# Patient Record
Sex: Male | Born: 1935 | Race: White | Hispanic: No | Marital: Married | Smoking: Never smoker
Health system: Southern US, Community
[De-identification: ages and names within clinical notes are randomized; demographics above are authoritative.]

## PROBLEM LIST (undated history)

## (undated) DIAGNOSIS — I6529 Occlusion and stenosis of unspecified carotid artery: Secondary | ICD-10-CM

## (undated) DIAGNOSIS — F329 Major depressive disorder, single episode, unspecified: Secondary | ICD-10-CM

## (undated) DIAGNOSIS — H269 Unspecified cataract: Secondary | ICD-10-CM

## (undated) DIAGNOSIS — R42 Dizziness and giddiness: Secondary | ICD-10-CM

## (undated) DIAGNOSIS — I6381 Other cerebral infarction due to occlusion or stenosis of small artery: Secondary | ICD-10-CM

## (undated) DIAGNOSIS — E7800 Pure hypercholesterolemia, unspecified: Secondary | ICD-10-CM

## (undated) DIAGNOSIS — F419 Anxiety disorder, unspecified: Secondary | ICD-10-CM

## (undated) DIAGNOSIS — C61 Malignant neoplasm of prostate: Secondary | ICD-10-CM

## (undated) DIAGNOSIS — H409 Unspecified glaucoma: Secondary | ICD-10-CM

## (undated) DIAGNOSIS — M25561 Pain in right knee: Secondary | ICD-10-CM

## (undated) DIAGNOSIS — F1320 Sedative, hypnotic or anxiolytic dependence, uncomplicated: Secondary | ICD-10-CM

## (undated) DIAGNOSIS — I10 Essential (primary) hypertension: Secondary | ICD-10-CM

## (undated) DIAGNOSIS — M25562 Pain in left knee: Secondary | ICD-10-CM

## (undated) DIAGNOSIS — B0230 Zoster ocular disease, unspecified: Secondary | ICD-10-CM

## (undated) DIAGNOSIS — Z5189 Encounter for other specified aftercare: Secondary | ICD-10-CM

## (undated) DIAGNOSIS — R269 Unspecified abnormalities of gait and mobility: Secondary | ICD-10-CM

## (undated) DIAGNOSIS — H9190 Unspecified hearing loss, unspecified ear: Secondary | ICD-10-CM

## (undated) DIAGNOSIS — M5416 Radiculopathy, lumbar region: Secondary | ICD-10-CM

## (undated) DIAGNOSIS — M1990 Unspecified osteoarthritis, unspecified site: Secondary | ICD-10-CM

## (undated) DIAGNOSIS — H538 Other visual disturbances: Secondary | ICD-10-CM

## (undated) DIAGNOSIS — R2689 Other abnormalities of gait and mobility: Secondary | ICD-10-CM

## (undated) DIAGNOSIS — M461 Sacroiliitis, not elsewhere classified: Secondary | ICD-10-CM

## (undated) HISTORY — DX: Pain in right knee: M25.561

## (undated) HISTORY — DX: Unspecified osteoarthritis, unspecified site: M19.90

## (undated) HISTORY — DX: Zoster ocular disease, unspecified: B02.30

## (undated) HISTORY — DX: Other abnormalities of gait and mobility: R26.89

## (undated) HISTORY — DX: Radiculopathy, lumbar region: M54.16

## (undated) HISTORY — DX: Unspecified glaucoma: H40.9

## (undated) HISTORY — DX: Major depressive disorder, single episode, unspecified: F32.9

## (undated) HISTORY — PX: HERNIA REPAIR: SHX51

## (undated) HISTORY — DX: Pain in right knee: M25.562

## (undated) HISTORY — DX: Unspecified hearing loss, unspecified ear: H91.90

## (undated) HISTORY — DX: Anxiety disorder, unspecified: F41.9

## (undated) HISTORY — DX: Encounter for other specified aftercare: Z51.89

## (undated) HISTORY — PX: PROSTATECTOMY: SHX69

## (undated) HISTORY — DX: Dizziness and giddiness: R42

## (undated) HISTORY — DX: Unspecified abnormalities of gait and mobility: R26.9

## (undated) HISTORY — DX: Other visual disturbances: H53.8

## (undated) HISTORY — DX: Unspecified cataract: H26.9

## (undated) HISTORY — DX: Occlusion and stenosis of unspecified carotid artery: I65.29

## (undated) HISTORY — PX: COLONOSCOPY: SHX174

## (undated) HISTORY — DX: Sacroiliitis, not elsewhere classified: M46.1

## (undated) HISTORY — DX: Malignant neoplasm of prostate: C61

## (undated) HISTORY — DX: Other cerebral infarction due to occlusion or stenosis of small artery: I63.81

## (undated) HISTORY — DX: Sedative, hypnotic or anxiolytic dependence, uncomplicated: F13.20

---

## 2000-06-27 ENCOUNTER — Emergency Department (HOSPITAL_COMMUNITY): Admission: EM | Admit: 2000-06-27 | Discharge: 2000-06-27 | Payer: Self-pay | Admitting: Emergency Medicine

## 2000-06-28 ENCOUNTER — Other Ambulatory Visit: Admission: RE | Admit: 2000-06-28 | Discharge: 2000-06-28 | Payer: Self-pay | Admitting: Urology

## 2000-07-04 ENCOUNTER — Emergency Department (HOSPITAL_COMMUNITY): Admission: EM | Admit: 2000-07-04 | Discharge: 2000-07-04 | Payer: Self-pay | Admitting: Internal Medicine

## 2000-07-09 ENCOUNTER — Encounter: Admission: RE | Admit: 2000-07-09 | Discharge: 2000-10-07 | Payer: Self-pay | Admitting: Radiation Oncology

## 2000-10-08 ENCOUNTER — Ambulatory Visit (HOSPITAL_COMMUNITY): Admission: RE | Admit: 2000-10-08 | Discharge: 2000-10-08 | Payer: Self-pay | Admitting: Urology

## 2000-10-08 ENCOUNTER — Encounter: Payer: Self-pay | Admitting: Urology

## 2000-10-22 ENCOUNTER — Ambulatory Visit (HOSPITAL_COMMUNITY): Admission: RE | Admit: 2000-10-22 | Discharge: 2000-10-22 | Payer: Self-pay | Admitting: Urology

## 2000-11-20 ENCOUNTER — Inpatient Hospital Stay (HOSPITAL_COMMUNITY): Admission: RE | Admit: 2000-11-20 | Discharge: 2000-11-23 | Payer: Self-pay | Admitting: Urology

## 2001-04-01 ENCOUNTER — Ambulatory Visit (HOSPITAL_COMMUNITY): Admission: RE | Admit: 2001-04-01 | Discharge: 2001-04-01 | Payer: Self-pay | Admitting: Internal Medicine

## 2001-04-01 ENCOUNTER — Encounter: Payer: Self-pay | Admitting: Internal Medicine

## 2001-11-22 ENCOUNTER — Observation Stay (HOSPITAL_COMMUNITY): Admission: RE | Admit: 2001-11-22 | Discharge: 2001-11-23 | Payer: Self-pay | Admitting: Surgery

## 2002-03-06 ENCOUNTER — Ambulatory Visit (HOSPITAL_COMMUNITY): Admission: RE | Admit: 2002-03-06 | Discharge: 2002-03-06 | Payer: Self-pay | Admitting: Gastroenterology

## 2003-04-06 ENCOUNTER — Encounter: Admission: RE | Admit: 2003-04-06 | Discharge: 2003-04-06 | Payer: Self-pay | Admitting: Obstetrics and Gynecology

## 2003-06-25 ENCOUNTER — Observation Stay (HOSPITAL_COMMUNITY): Admission: RE | Admit: 2003-06-25 | Discharge: 2003-06-26 | Payer: Self-pay | Admitting: Surgery

## 2003-10-19 ENCOUNTER — Encounter: Admission: RE | Admit: 2003-10-19 | Discharge: 2003-10-19 | Payer: Self-pay | Admitting: Internal Medicine

## 2003-10-27 ENCOUNTER — Encounter: Admission: RE | Admit: 2003-10-27 | Discharge: 2003-11-30 | Payer: Self-pay | Admitting: Internal Medicine

## 2005-07-24 ENCOUNTER — Ambulatory Visit (HOSPITAL_COMMUNITY): Admission: RE | Admit: 2005-07-24 | Discharge: 2005-07-25 | Payer: Self-pay | Admitting: Surgery

## 2008-06-23 ENCOUNTER — Ambulatory Visit: Payer: Self-pay | Admitting: Internal Medicine

## 2008-07-14 ENCOUNTER — Encounter: Payer: Self-pay | Admitting: Internal Medicine

## 2008-07-14 ENCOUNTER — Ambulatory Visit: Payer: Self-pay | Admitting: Internal Medicine

## 2008-07-14 LAB — HM COLONOSCOPY

## 2008-07-17 ENCOUNTER — Encounter: Payer: Self-pay | Admitting: Internal Medicine

## 2009-05-23 ENCOUNTER — Emergency Department (HOSPITAL_BASED_OUTPATIENT_CLINIC_OR_DEPARTMENT_OTHER): Admission: EM | Admit: 2009-05-23 | Discharge: 2009-05-23 | Payer: Self-pay | Admitting: Emergency Medicine

## 2010-08-05 NOTE — Op Note (Signed)
NAMELEORY, ALLINSON                      ACCOUNT NO.:  [id number]   MEDICAL RECORD NO.:  [id number]                   PATIENT TYPE:  OBV   LOCATION:  0365                                 FACILITY:  [facility]   PHYSICIAN:  Sandria Bales. Ezzard Standing, M.D.               DATE OF BIRTH:  February 17, 1930   DATE OF PROCEDURE:  06/25/2003  DATE OF DISCHARGE:                                 OPERATIVE REPORT   PREOPERATIVE DIAGNOSES:  Recurrent lower abdominal wall incisional hernia.   POSTOPERATIVE DIAGNOSES:  Recurrent left lower quadrant abdominal wall  incisional hernia.   PROCEDURE:  Open repair of left lower quadrant incisional hernia with mesh.   SURGEON:  Sandria Bales. Ezzard Standing, M.D.   ANESTHESIA:  General endotracheal.   ESTIMATED BLOOD LOSS:  Minimal.   INDICATIONS FOR PROCEDURE:  Mr. Mans is a 77 year old white male who had  a prior open prostatectomy by Jamison Neighbor, M.D. and then developed a  left inguinal hernia.  His open prostatectomy was September of 2002.  I did  an open repair of the lower abdominal hernia and inguinal hernia in  September of 2003.  He has now noticed a bulge in his left groin so it has  been approximately 18 months since his last surgery.  A CT scan confirmed  evidence for recurrent hernia.  It is hard to tell on physical examination  whether this is a direct inguinal hernia or this is a recurrent lower  abdominal wall hernia. The discussion of the indication and potential  complications explained to the patient. The potential complications include  but not limited to bleeding, infection, recurrence of the hernia, and  bladder injury.   The patient taken to the operating room where he underwent general  anesthesia.  I did put a Foley catheter in him because it looks like on CAT  scan the bladder comes up to the posterior wall of this hernia defect.  He  got 1 g of Ancef at the initiation of the procedure, his lower abdomen was  shaved, prepped with Betadine  solution and sterilely draped.   I made a left inguinal incision and cut down to the abdominal wall. This  hernia really is an incisional lower abdominal wall hernia and not an  inguinal hernia. The inferior border is the pubic bone, the right border of  the hernia appears to be the rectus muscle and fascia on the right side. The  superior and left lateral borders appear to be some mesh and a continuing  defect.   I mobilized the hernia sac and identified about a 3 x 4 cm hernia defect as  already outlined.   I then sewed in atrial mesh using interrupted #0 novofil sutures. The suture  inferiorly was tacked to the pubic bone to the right side and was tacked to  the rectus abdominis muscle and fascia. Superiorly and laterally the tissues  were thinned out and  this probably was the reason for this recurrent hernia  though I had put some mesh in there before and I tried to capture the  previous mesh to help hold this in place.   His cord was went lateral to the defect so I do not think this was again  involved in an inguinal hernia but more a lower abdominal wall hernia.  The  mesh I lay after I sewed it in place measured about 7 x 8 cm. There was  about a 2 cm overlap on all sides of the hernia defect.  I then irrigated  the wound with about 800 mL of saline, closed the subcutaneous tissues with  interrupted 3-0 Vicryl sutures, closed the skin with a running 5-0 Vicryl  suture, infiltrated about 20 mL of 0.25% Marcaine as a local anesthetic. I  painted the wound with tincture of  Benzoin and steri-stripped it.   The patient tolerated the procedure well and was transported to the recovery  room in good condition.                                               Sandria Bales. Ezzard Standing, M.D.    DHN/MEDQ  D:  06/25/2003  T:  06/25/2003  Job:  [id number]   cc:   Geoffry Paradise, M.D.  [address]  [place]  [place] [address]  Fax: [phone]   Jamison Neighbor, M.D.  [address]   [place]  [place] [address]  Fax: [phone]

## 2010-08-05 NOTE — Op Note (Signed)
[name] Duffy, [name] Duffy            ACCOUNT NO.:  [id number]   MEDICAL RECORD NO.:  [id number]          PATIENT TYPE:  AMB   LOCATION:  DAY                          [facility]ACILITY:  [facility]   PHYSICIAN:  Sandria Bales. Ezzard Standing, M.D.  DATE OF BIRTH:  February 19, 1930   DATE OF PROCEDURE:  07/24/2005  DATE OF DISCHARGE:                                 OPERATIVE REPORT   PREOPERATIVE DIAGNOSIS:  Recurrent lower abdominal hernia off the left groin  area.   POSTOPERATIVE DIAGNOSIS:  Recurrent lower abdominal wall hernia over the  left suprapubic groin area.   PROCEDURE:  Open repair of left lower abdominal wall hernia with 6 x 6-cm  Ventralex mesh repair.   SURGEON:  Sandria Bales. Ezzard Standing, M.D.   FIRST ASSISTANT:  None.   ANESTHESIA:  General LMA with approximately 20 cc of 0.25% Marcaine.   COMPLICATIONS:  None.   INDICATIONS FOR PROCEDURE:  Mr. Driggers is a 77 year old male patient of Dr.  Othelia Pulling who initially had a prostatectomy, I think by Dr. Eudelia Bunch.  He developed a hernia in his lower abdominal wall which I originally  repaired in September of 2003, and then in April of 2005, he had a second  repair of a lower abdominal wall/left inguinal hernia.  This time he appears  to have another lower abdominal wall hernia which protrudes in the lower  abdomen off the left side of midline.  It was still a primary incisional  hernia.  Over the last month or two though, he has now noticed a bulge again  in his left groin.  A CT scan is suggestive of a left lower abdominal  wall/inguinal hernia.  He now comes for repair again of this defect.   The indications and potential complications were explained to the patient,  the potential complications including but not limited to bleeding,  infection, recurrence of the hernia, nerve injury, and bladder injury.   DESCRIPTION OF PROCEDURE:  The patient was placed in a supine position and  given a general LMA anesthesia.  He was given 1 g of Ancef at the  initiation  of the procedure.  A Foley catheter was placed.  His abdomen was shaved,  prepped with Betadine solution, and sterilely draped.   An incision was made directly in the left groin area.  I marked him  preoperatively standing, kind of showing where this bulge was.  This bulge  was coming from a lower abdominal wall hernia or a direct inguinal hernia.  Because of his multiple operations, really he had no clear planes of fascia  that were normal or obvious.  It appears that what he has is sort of a  defect where the abdominal wall was giving way between the mesh medially  from his incisional hernia and the mesh laterally from his groin hernia.   I actually opened to the mesh in the lower abdominal wall and found about a  maybe 3 to 4-cm defect.   I was worried by the way the CT scan looked that he may have bladder up in  it, but actually he had preperitoneal fat  that I was able to push away from  the anterior abdominal wall, so I decided to place a preperitoneal Ventralex  mesh in this space.  This mesh measured  6 x 6 cm, so this is a medium  Ventralex mesh.  I sewed this in place with 0 Novofil sutures, and this  covered the defect well.  With my finger in his preperitoneal space, I felt  no other obvious hernia of his lower abdominal wall, at least in that  limited area.  I then closed the fascia and closed the mesh over this with  interrupted #1 Novofil sutures.  I then irrigated the wound and closed the  subcutaneous tissues with 3-0 Vicryl sutures and the skin with a 4-0 Vicryl  suture.  I painted the wound with tincture of benzoin and Steri-Strips.   The patient tolerated the procedure well and was transported to the recovery  room in good condition.  Sponge and needle counts were correct at the end of  the case.      Sandria Bales. Ezzard Standing, M.D.  Electronically Signed     DHN/MEDQ  D:  07/24/2005  T:  07/25/2005  Job:  [id number]   cc:   Geoffry Paradise, M.D.  Fax:  [phone]   Jamison Neighbor, M.D.  Fax: [phone]

## 2011-01-28 DIAGNOSIS — Z8546 Personal history of malignant neoplasm of prostate: Secondary | ICD-10-CM | POA: Insufficient documentation

## 2011-07-05 DIAGNOSIS — M1990 Unspecified osteoarthritis, unspecified site: Secondary | ICD-10-CM | POA: Diagnosis not present

## 2011-07-05 DIAGNOSIS — C61 Malignant neoplasm of prostate: Secondary | ICD-10-CM | POA: Diagnosis not present

## 2011-07-05 DIAGNOSIS — I10 Essential (primary) hypertension: Secondary | ICD-10-CM | POA: Diagnosis not present

## 2011-11-13 ENCOUNTER — Other Ambulatory Visit: Payer: Self-pay | Admitting: Dermatology

## 2011-11-13 DIAGNOSIS — B359 Dermatophytosis, unspecified: Secondary | ICD-10-CM | POA: Diagnosis not present

## 2011-11-13 DIAGNOSIS — D485 Neoplasm of uncertain behavior of skin: Secondary | ICD-10-CM | POA: Diagnosis not present

## 2011-11-13 DIAGNOSIS — L719 Rosacea, unspecified: Secondary | ICD-10-CM | POA: Diagnosis not present

## 2011-11-13 DIAGNOSIS — L821 Other seborrheic keratosis: Secondary | ICD-10-CM | POA: Diagnosis not present

## 2011-11-13 DIAGNOSIS — D2370 Other benign neoplasm of skin of unspecified lower limb, including hip: Secondary | ICD-10-CM | POA: Diagnosis not present

## 2011-11-13 DIAGNOSIS — L578 Other skin changes due to chronic exposure to nonionizing radiation: Secondary | ICD-10-CM | POA: Diagnosis not present

## 2011-11-13 DIAGNOSIS — C4441 Basal cell carcinoma of skin of scalp and neck: Secondary | ICD-10-CM | POA: Diagnosis not present

## 2011-11-24 DIAGNOSIS — R82998 Other abnormal findings in urine: Secondary | ICD-10-CM | POA: Diagnosis not present

## 2011-11-24 DIAGNOSIS — N3000 Acute cystitis without hematuria: Secondary | ICD-10-CM | POA: Diagnosis not present

## 2011-11-24 DIAGNOSIS — C61 Malignant neoplasm of prostate: Secondary | ICD-10-CM | POA: Diagnosis not present

## 2011-11-24 DIAGNOSIS — R310 Gross hematuria: Secondary | ICD-10-CM | POA: Diagnosis not present

## 2011-11-24 DIAGNOSIS — I10 Essential (primary) hypertension: Secondary | ICD-10-CM | POA: Diagnosis not present

## 2011-12-08 DIAGNOSIS — R310 Gross hematuria: Secondary | ICD-10-CM | POA: Diagnosis not present

## 2011-12-13 DIAGNOSIS — N393 Stress incontinence (female) (male): Secondary | ICD-10-CM | POA: Insufficient documentation

## 2011-12-13 DIAGNOSIS — C61 Malignant neoplasm of prostate: Secondary | ICD-10-CM | POA: Diagnosis not present

## 2011-12-13 DIAGNOSIS — Z8546 Personal history of malignant neoplasm of prostate: Secondary | ICD-10-CM | POA: Diagnosis not present

## 2012-01-02 DIAGNOSIS — I10 Essential (primary) hypertension: Secondary | ICD-10-CM | POA: Diagnosis not present

## 2012-01-02 DIAGNOSIS — E785 Hyperlipidemia, unspecified: Secondary | ICD-10-CM | POA: Diagnosis not present

## 2012-01-02 DIAGNOSIS — Z125 Encounter for screening for malignant neoplasm of prostate: Secondary | ICD-10-CM | POA: Diagnosis not present

## 2012-01-05 DIAGNOSIS — L905 Scar conditions and fibrosis of skin: Secondary | ICD-10-CM | POA: Diagnosis not present

## 2012-01-05 DIAGNOSIS — Z85828 Personal history of other malignant neoplasm of skin: Secondary | ICD-10-CM | POA: Diagnosis not present

## 2012-01-05 DIAGNOSIS — L821 Other seborrheic keratosis: Secondary | ICD-10-CM | POA: Diagnosis not present

## 2012-01-09 DIAGNOSIS — Z0000 Encounter for general adult medical examination without abnormal findings: Secondary | ICD-10-CM | POA: Diagnosis not present

## 2012-01-09 DIAGNOSIS — E785 Hyperlipidemia, unspecified: Secondary | ICD-10-CM | POA: Diagnosis not present

## 2012-01-09 DIAGNOSIS — Z1331 Encounter for screening for depression: Secondary | ICD-10-CM | POA: Diagnosis not present

## 2012-01-09 DIAGNOSIS — I10 Essential (primary) hypertension: Secondary | ICD-10-CM | POA: Diagnosis not present

## 2012-01-09 DIAGNOSIS — F329 Major depressive disorder, single episode, unspecified: Secondary | ICD-10-CM | POA: Diagnosis not present

## 2012-01-15 DIAGNOSIS — Z1212 Encounter for screening for malignant neoplasm of rectum: Secondary | ICD-10-CM | POA: Diagnosis not present

## 2012-03-27 DIAGNOSIS — N393 Stress incontinence (female) (male): Secondary | ICD-10-CM | POA: Diagnosis not present

## 2012-03-27 DIAGNOSIS — Z8546 Personal history of malignant neoplasm of prostate: Secondary | ICD-10-CM | POA: Diagnosis not present

## 2012-03-27 DIAGNOSIS — C61 Malignant neoplasm of prostate: Secondary | ICD-10-CM | POA: Diagnosis not present

## 2012-07-08 DIAGNOSIS — L259 Unspecified contact dermatitis, unspecified cause: Secondary | ICD-10-CM | POA: Diagnosis not present

## 2012-07-08 DIAGNOSIS — D1801 Hemangioma of skin and subcutaneous tissue: Secondary | ICD-10-CM | POA: Diagnosis not present

## 2012-07-08 DIAGNOSIS — L821 Other seborrheic keratosis: Secondary | ICD-10-CM | POA: Diagnosis not present

## 2012-07-08 DIAGNOSIS — B353 Tinea pedis: Secondary | ICD-10-CM | POA: Diagnosis not present

## 2012-07-08 DIAGNOSIS — Z85828 Personal history of other malignant neoplasm of skin: Secondary | ICD-10-CM | POA: Diagnosis not present

## 2012-07-19 DIAGNOSIS — M1990 Unspecified osteoarthritis, unspecified site: Secondary | ICD-10-CM | POA: Diagnosis not present

## 2012-07-19 DIAGNOSIS — F329 Major depressive disorder, single episode, unspecified: Secondary | ICD-10-CM | POA: Diagnosis not present

## 2012-07-19 DIAGNOSIS — IMO0002 Reserved for concepts with insufficient information to code with codable children: Secondary | ICD-10-CM | POA: Diagnosis not present

## 2012-07-19 DIAGNOSIS — I10 Essential (primary) hypertension: Secondary | ICD-10-CM | POA: Diagnosis not present

## 2012-07-19 DIAGNOSIS — E785 Hyperlipidemia, unspecified: Secondary | ICD-10-CM | POA: Diagnosis not present

## 2012-08-14 DIAGNOSIS — H905 Unspecified sensorineural hearing loss: Secondary | ICD-10-CM | POA: Diagnosis not present

## 2012-08-30 DIAGNOSIS — L259 Unspecified contact dermatitis, unspecified cause: Secondary | ICD-10-CM | POA: Diagnosis not present

## 2012-08-30 DIAGNOSIS — I8390 Asymptomatic varicose veins of unspecified lower extremity: Secondary | ICD-10-CM | POA: Diagnosis not present

## 2012-11-05 DIAGNOSIS — L0100 Impetigo, unspecified: Secondary | ICD-10-CM | POA: Diagnosis not present

## 2012-11-05 DIAGNOSIS — Z85828 Personal history of other malignant neoplasm of skin: Secondary | ICD-10-CM | POA: Diagnosis not present

## 2012-12-13 DIAGNOSIS — IMO0002 Reserved for concepts with insufficient information to code with codable children: Secondary | ICD-10-CM | POA: Diagnosis not present

## 2012-12-13 DIAGNOSIS — M7040 Prepatellar bursitis, unspecified knee: Secondary | ICD-10-CM | POA: Diagnosis not present

## 2012-12-17 DIAGNOSIS — I10 Essential (primary) hypertension: Secondary | ICD-10-CM | POA: Diagnosis not present

## 2012-12-17 DIAGNOSIS — M7040 Prepatellar bursitis, unspecified knee: Secondary | ICD-10-CM | POA: Diagnosis not present

## 2013-01-06 ENCOUNTER — Encounter (HOSPITAL_COMMUNITY): Payer: Self-pay | Admitting: Emergency Medicine

## 2013-01-06 ENCOUNTER — Emergency Department (HOSPITAL_COMMUNITY)
Admission: EM | Admit: 2013-01-06 | Discharge: 2013-01-06 | Disposition: A | Discharge status: Home / Self Care | Payer: Medicare Other | Attending: Emergency Medicine | Admitting: Emergency Medicine

## 2013-01-06 DIAGNOSIS — R319 Hematuria, unspecified: Secondary | ICD-10-CM

## 2013-01-06 DIAGNOSIS — Z79899 Other long term (current) drug therapy: Secondary | ICD-10-CM | POA: Diagnosis not present

## 2013-01-06 DIAGNOSIS — Z8546 Personal history of malignant neoplasm of prostate: Secondary | ICD-10-CM | POA: Insufficient documentation

## 2013-01-06 DIAGNOSIS — Z792 Long term (current) use of antibiotics: Secondary | ICD-10-CM | POA: Insufficient documentation

## 2013-01-06 DIAGNOSIS — R300 Dysuria: Secondary | ICD-10-CM | POA: Insufficient documentation

## 2013-01-06 DIAGNOSIS — I10 Essential (primary) hypertension: Secondary | ICD-10-CM | POA: Diagnosis not present

## 2013-01-06 DIAGNOSIS — E7800 Pure hypercholesterolemia, unspecified: Secondary | ICD-10-CM | POA: Diagnosis not present

## 2013-01-06 HISTORY — DX: Pure hypercholesterolemia, unspecified: E78.00

## 2013-01-06 HISTORY — DX: Malignant neoplasm of prostate: C61

## 2013-01-06 HISTORY — DX: Essential (primary) hypertension: I10

## 2013-01-06 LAB — URINALYSIS, ROUTINE W REFLEX MICROSCOPIC
Bilirubin Urine: NEGATIVE
Glucose, UA: NEGATIVE mg/dL
Ketones, ur: NEGATIVE mg/dL
pH: 6 (ref 5.0–8.0)

## 2013-01-06 MED ORDER — CIPROFLOXACIN HCL 500 MG PO TABS
500.0000 mg | ORAL_TABLET | Freq: Once | ORAL | Status: AC
  Administered 2013-01-06: 500 mg via ORAL
  Filled 2013-01-06: qty 1

## 2013-01-06 MED ORDER — CIPROFLOXACIN HCL 500 MG PO TABS: 500.0000 mg | ORAL_TABLET | Freq: Two times a day (BID) | ORAL | Status: DC

## 2013-01-06 NOTE — ED Notes (Signed)
Bladder scan done post voiding and only showed 49ml in the bladder

## 2013-01-06 NOTE — ED Notes (Signed)
Pt states this morning around 2am he got up to urinate and his urine had a lot of blood in it  Pt states the next few time he went he had blood clots in it  Pt states once had heavy clots and the other two times just a small amt of clots  Pt states he was ok yesterday  Pt denies pain at this time

## 2013-01-06 NOTE — ED Provider Notes (Signed)
CSN: [id number]     Arrival date & time 01/06/13  0636 History   First MD Initiated Contact with Patient 01/06/13 [phone]     Chief Complaint  Patient presents with  . Hematuria   (Consider location/radiation/quality/duration/timing/severity/associated sxs/prior Treatment) HPI Comments: The pt is a 79 y/o male with hx of Prostate CA - treated in 2002 with radical prostatectomy - he presents with c/o hematuria which started this AM - 2 AM - he has had 3 discrete episodes of hematuria - states that he is passing clots when he urinates - this is associated with some burning dysuria - no abd pain, f/c/n/v.  He has had this a couple of times in the past related to infections.  He is not on anticoagulants.  Patient is a 79 y.o. male presenting with hematuria. The history is provided by the patient.  Hematuria Pertinent negatives include no abdominal pain.    Past Medical History  Diagnosis Date  . Prostate cancer   . Hypertension   . High cholesterol    Past Surgical History  Procedure Laterality Date  . Hernia repair    . Prostatectomy     Family History  Problem Relation Age of Onset  . Hypertension Other    History  Substance Use Topics  . Smoking status: Never Smoker   . Smokeless tobacco: Not on file  . Alcohol Use: No    Review of Systems  Constitutional: Negative for fever and chills.  Gastrointestinal: Negative for nausea, vomiting and abdominal pain.  Genitourinary: Positive for hematuria.    Allergies  Rofecoxib  Home Medications   Current Outpatient Rx  Name  Route  Sig  Dispense  Refill  . citalopram (CELEXA) 20 MG tablet   Oral   Take 1 tablet by mouth every evening.          Marland Kitchen losartan (COZAAR) 100 MG tablet   Oral   Take 1 tablet by mouth every morning.         . simvastatin (ZOCOR) 20 MG tablet   Oral   Take 1 tablet by mouth every evening.         . ciprofloxacin (CIPRO) 500 MG tablet   Oral   Take 1 tablet (500 mg total) by mouth every  12 (twelve) hours.   20 tablet   0    BP 152/84  Pulse 87  Temp(Src) 97.9 F (36.6 C) (Oral)  Resp 18  SpO2 95% Physical Exam  Constitutional: He appears well-developed and well-nourished. No distress.  HENT:  Head: Normocephalic and atraumatic.  Eyes: Conjunctivae are normal. Right eye exhibits no discharge. Left eye exhibits no discharge. No scleral icterus.  Cardiovascular: Normal rate and regular rhythm.   Occasional ectopy - no JVD, strong pulses at the radial arteries  Pulmonary/Chest: Effort normal. No respiratory distress. He has no wheezes. He has no rales.  Abdominal: Soft. He exhibits no distension. There is no tenderness. There is no rebound and no guarding.  No masses, no SP fullness  Genitourinary:  Normal appearing circumcised penis - small am't of blood at the urethral meatus  Musculoskeletal: Normal range of motion. He exhibits no edema and no tenderness.  Neurological: He is alert. Coordination normal.    ED Course  Procedures (including critical care time) Labs Review Labs Reviewed  URINALYSIS, ROUTINE W REFLEX MICROSCOPIC - Abnormal; Notable for the following:    Color, Urine RED (*)    APPearance CLOUDY (*)    Hgb urine dipstick LARGE (*)  Protein, ur >300 (*)    Leukocytes, UA SMALL (*)    All other components within normal limits  URINE CULTURE  URINE MICROSCOPIC-ADD ON   Imaging Review No results found.  EKG Interpretation   None       MDM   1. Hematuria    Hematuria - new onset, recurrent but over many years - Mr. Hardgrove is otherwise well appearing with normal VS, normal abd exam and no constitutional sx.  CheckUA - would consider UTI to be top of list - vascular source possible but less likely, recurrent CA possible.  UA pending.   Pt has hematuria on labs - VS's remain stable - he will be given Abx and f/u with Urology.  Meds given in ED:  Medications - No data to display  New Prescriptions   CIPROFLOXACIN (CIPRO) 500 MG  TABLET    Take 1 tablet (500 mg total) by mouth every 12 (twelve) hours.      Vida Roller, MD 01/06/13 [phone]

## 2013-01-07 LAB — URINE CULTURE: Colony Count: 50000

## 2013-01-08 DIAGNOSIS — D1801 Hemangioma of skin and subcutaneous tissue: Secondary | ICD-10-CM | POA: Diagnosis not present

## 2013-01-08 DIAGNOSIS — Z8546 Personal history of malignant neoplasm of prostate: Secondary | ICD-10-CM | POA: Diagnosis not present

## 2013-01-08 DIAGNOSIS — N393 Stress incontinence (female) (male): Secondary | ICD-10-CM | POA: Diagnosis not present

## 2013-01-08 DIAGNOSIS — L259 Unspecified contact dermatitis, unspecified cause: Secondary | ICD-10-CM | POA: Diagnosis not present

## 2013-01-08 DIAGNOSIS — D1800 Hemangioma unspecified site: Secondary | ICD-10-CM | POA: Diagnosis not present

## 2013-01-08 DIAGNOSIS — R319 Hematuria, unspecified: Secondary | ICD-10-CM | POA: Insufficient documentation

## 2013-01-08 DIAGNOSIS — Z85828 Personal history of other malignant neoplasm of skin: Secondary | ICD-10-CM | POA: Diagnosis not present

## 2013-01-08 DIAGNOSIS — L821 Other seborrheic keratosis: Secondary | ICD-10-CM | POA: Diagnosis not present

## 2013-01-22 DIAGNOSIS — E785 Hyperlipidemia, unspecified: Secondary | ICD-10-CM | POA: Diagnosis not present

## 2013-01-22 DIAGNOSIS — I10 Essential (primary) hypertension: Secondary | ICD-10-CM | POA: Diagnosis not present

## 2013-01-22 DIAGNOSIS — Z23 Encounter for immunization: Secondary | ICD-10-CM | POA: Diagnosis not present

## 2013-01-22 DIAGNOSIS — Z125 Encounter for screening for malignant neoplasm of prostate: Secondary | ICD-10-CM | POA: Diagnosis not present

## 2013-01-23 DIAGNOSIS — Z8546 Personal history of malignant neoplasm of prostate: Secondary | ICD-10-CM | POA: Diagnosis not present

## 2013-01-23 DIAGNOSIS — R319 Hematuria, unspecified: Secondary | ICD-10-CM | POA: Diagnosis not present

## 2013-01-23 DIAGNOSIS — N281 Cyst of kidney, acquired: Secondary | ICD-10-CM | POA: Diagnosis not present

## 2013-01-29 DIAGNOSIS — C61 Malignant neoplasm of prostate: Secondary | ICD-10-CM | POA: Diagnosis not present

## 2013-01-29 DIAGNOSIS — Z0000 Encounter for general adult medical examination without abnormal findings: Secondary | ICD-10-CM | POA: Diagnosis not present

## 2013-01-29 DIAGNOSIS — M1990 Unspecified osteoarthritis, unspecified site: Secondary | ICD-10-CM | POA: Diagnosis not present

## 2013-01-29 DIAGNOSIS — R310 Gross hematuria: Secondary | ICD-10-CM | POA: Diagnosis not present

## 2013-01-29 DIAGNOSIS — Z1331 Encounter for screening for depression: Secondary | ICD-10-CM | POA: Diagnosis not present

## 2013-01-29 DIAGNOSIS — F329 Major depressive disorder, single episode, unspecified: Secondary | ICD-10-CM | POA: Diagnosis not present

## 2013-01-29 DIAGNOSIS — I10 Essential (primary) hypertension: Secondary | ICD-10-CM | POA: Diagnosis not present

## 2013-01-29 DIAGNOSIS — E785 Hyperlipidemia, unspecified: Secondary | ICD-10-CM | POA: Diagnosis not present

## 2013-03-07 DIAGNOSIS — Z1212 Encounter for screening for malignant neoplasm of rectum: Secondary | ICD-10-CM | POA: Diagnosis not present

## 2013-05-19 ENCOUNTER — Encounter: Payer: Self-pay | Admitting: Internal Medicine

## 2013-07-23 DIAGNOSIS — F411 Generalized anxiety disorder: Secondary | ICD-10-CM | POA: Insufficient documentation

## 2014-02-11 ENCOUNTER — Other Ambulatory Visit: Payer: Self-pay | Admitting: Dermatology

## 2014-02-11 DIAGNOSIS — C4441 Basal cell carcinoma of skin of scalp and neck: Secondary | ICD-10-CM | POA: Diagnosis not present

## 2014-02-11 DIAGNOSIS — D225 Melanocytic nevi of trunk: Secondary | ICD-10-CM | POA: Diagnosis not present

## 2014-02-11 DIAGNOSIS — D1801 Hemangioma of skin and subcutaneous tissue: Secondary | ICD-10-CM | POA: Diagnosis not present

## 2014-02-11 DIAGNOSIS — L308 Other specified dermatitis: Secondary | ICD-10-CM | POA: Diagnosis not present

## 2014-02-11 DIAGNOSIS — I8391 Asymptomatic varicose veins of right lower extremity: Secondary | ICD-10-CM | POA: Diagnosis not present

## 2014-02-11 DIAGNOSIS — L570 Actinic keratosis: Secondary | ICD-10-CM | POA: Diagnosis not present

## 2014-02-11 DIAGNOSIS — L821 Other seborrheic keratosis: Secondary | ICD-10-CM | POA: Diagnosis not present

## 2014-02-11 DIAGNOSIS — I8392 Asymptomatic varicose veins of left lower extremity: Secondary | ICD-10-CM | POA: Diagnosis not present

## 2014-02-11 DIAGNOSIS — Z85828 Personal history of other malignant neoplasm of skin: Secondary | ICD-10-CM | POA: Diagnosis not present

## 2014-04-01 DIAGNOSIS — B372 Candidiasis of skin and nail: Secondary | ICD-10-CM | POA: Diagnosis not present

## 2014-04-01 DIAGNOSIS — Z6823 Body mass index (BMI) 23.0-23.9, adult: Secondary | ICD-10-CM | POA: Diagnosis not present

## 2014-04-27 DIAGNOSIS — L84 Corns and callosities: Secondary | ICD-10-CM | POA: Diagnosis not present

## 2014-04-27 DIAGNOSIS — L309 Dermatitis, unspecified: Secondary | ICD-10-CM | POA: Diagnosis not present

## 2014-04-27 DIAGNOSIS — L602 Onychogryphosis: Secondary | ICD-10-CM | POA: Diagnosis not present

## 2014-04-30 DIAGNOSIS — B372 Candidiasis of skin and nail: Secondary | ICD-10-CM | POA: Diagnosis not present

## 2014-04-30 DIAGNOSIS — E785 Hyperlipidemia, unspecified: Secondary | ICD-10-CM | POA: Diagnosis not present

## 2014-04-30 DIAGNOSIS — F329 Major depressive disorder, single episode, unspecified: Secondary | ICD-10-CM | POA: Diagnosis not present

## 2014-04-30 DIAGNOSIS — Z6823 Body mass index (BMI) 23.0-23.9, adult: Secondary | ICD-10-CM | POA: Diagnosis not present

## 2014-04-30 DIAGNOSIS — M1990 Unspecified osteoarthritis, unspecified site: Secondary | ICD-10-CM | POA: Diagnosis not present

## 2014-04-30 DIAGNOSIS — I10 Essential (primary) hypertension: Secondary | ICD-10-CM | POA: Diagnosis not present

## 2014-04-30 DIAGNOSIS — F419 Anxiety disorder, unspecified: Secondary | ICD-10-CM | POA: Diagnosis not present

## 2014-04-30 DIAGNOSIS — Z1389 Encounter for screening for other disorder: Secondary | ICD-10-CM | POA: Diagnosis not present

## 2014-05-22 ENCOUNTER — Encounter: Payer: Self-pay | Admitting: Internal Medicine

## 2014-06-10 DIAGNOSIS — H4011X4 Primary open-angle glaucoma, indeterminate stage: Secondary | ICD-10-CM | POA: Diagnosis not present

## 2014-07-23 DIAGNOSIS — Z8546 Personal history of malignant neoplasm of prostate: Secondary | ICD-10-CM | POA: Diagnosis not present

## 2014-08-25 ENCOUNTER — Other Ambulatory Visit (HOSPITAL_COMMUNITY): Payer: Self-pay | Admitting: Internal Medicine

## 2014-08-25 ENCOUNTER — Ambulatory Visit (HOSPITAL_COMMUNITY)
Admission: RE | Admit: 2014-08-25 | Discharge: 2014-08-25 | Disposition: A | Discharge status: Home / Self Care | Payer: Medicare Other | Source: Ambulatory Visit | Attending: Vascular Surgery | Admitting: Vascular Surgery

## 2014-08-25 DIAGNOSIS — M7989 Other specified soft tissue disorders: Secondary | ICD-10-CM | POA: Diagnosis not present

## 2014-08-25 DIAGNOSIS — Z6823 Body mass index (BMI) 23.0-23.9, adult: Secondary | ICD-10-CM | POA: Diagnosis not present

## 2014-08-25 DIAGNOSIS — R2241 Localized swelling, mass and lump, right lower limb: Secondary | ICD-10-CM | POA: Diagnosis not present

## 2014-08-25 NOTE — Progress Notes (Signed)
Preliminary result phoned and given to [facility]

## 2014-10-12 DIAGNOSIS — H401190 Primary open-angle glaucoma, unspecified eye, stage unspecified: Secondary | ICD-10-CM | POA: Insufficient documentation

## 2014-10-12 DIAGNOSIS — H25813 Combined forms of age-related cataract, bilateral: Secondary | ICD-10-CM | POA: Diagnosis not present

## 2014-10-12 DIAGNOSIS — H40012 Open angle with borderline findings, low risk, left eye: Secondary | ICD-10-CM | POA: Diagnosis not present

## 2014-10-12 DIAGNOSIS — H4011X4 Primary open-angle glaucoma, indeterminate stage: Secondary | ICD-10-CM | POA: Diagnosis not present

## 2014-10-12 DIAGNOSIS — H25819 Combined forms of age-related cataract, unspecified eye: Secondary | ICD-10-CM | POA: Insufficient documentation

## 2014-10-12 DIAGNOSIS — H40019 Open angle with borderline findings, low risk, unspecified eye: Secondary | ICD-10-CM | POA: Insufficient documentation

## 2014-11-11 DIAGNOSIS — H903 Sensorineural hearing loss, bilateral: Secondary | ICD-10-CM | POA: Diagnosis not present

## 2014-12-10 DIAGNOSIS — Z23 Encounter for immunization: Secondary | ICD-10-CM | POA: Diagnosis not present

## 2015-01-26 DIAGNOSIS — I10 Essential (primary) hypertension: Secondary | ICD-10-CM | POA: Diagnosis not present

## 2015-01-26 DIAGNOSIS — Z125 Encounter for screening for malignant neoplasm of prostate: Secondary | ICD-10-CM | POA: Diagnosis not present

## 2015-01-26 DIAGNOSIS — E784 Other hyperlipidemia: Secondary | ICD-10-CM | POA: Diagnosis not present

## 2015-02-12 DIAGNOSIS — H40012 Open angle with borderline findings, low risk, left eye: Secondary | ICD-10-CM | POA: Diagnosis not present

## 2015-02-12 DIAGNOSIS — H401114 Primary open-angle glaucoma, right eye, indeterminate stage: Secondary | ICD-10-CM | POA: Diagnosis not present

## 2015-02-12 DIAGNOSIS — H5340 Unspecified visual field defects: Secondary | ICD-10-CM | POA: Diagnosis not present

## 2015-02-19 ENCOUNTER — Encounter: Payer: Self-pay | Admitting: Podiatry

## 2015-02-19 ENCOUNTER — Ambulatory Visit (INDEPENDENT_AMBULATORY_CARE_PROVIDER_SITE_OTHER): Payer: Medicare Other | Admitting: Podiatry

## 2015-02-19 ENCOUNTER — Other Ambulatory Visit: Payer: Self-pay | Admitting: *Deleted

## 2015-02-19 VITALS — BP 132/85 | HR 73 | Resp 16 | Ht 74.0 in | Wt 186.0 lb

## 2015-02-19 DIAGNOSIS — L84 Corns and callosities: Secondary | ICD-10-CM

## 2015-02-19 DIAGNOSIS — M2041 Other hammer toe(s) (acquired), right foot: Secondary | ICD-10-CM

## 2015-02-19 NOTE — Progress Notes (Signed)
   Subjective:    Patient ID: [name] Duffy, male    DOB: [birth year], 81 y.o.   MRN: [id number]  HPI Patient presents with a callous on their right foot, 3rd toe; since last Spring 2015. Pt stated, "looks like a mushroom".   Review of Systems  HENT: Positive for hearing loss and tinnitus.   Eyes: Positive for visual disturbance.  Musculoskeletal: Positive for back pain and arthralgias.  Neurological: Positive for dizziness.  All other systems reviewed and are negative.      Objective:   Physical Exam        Assessment & Plan:

## 2015-02-21 NOTE — Progress Notes (Signed)
Subjective:     Patient ID: [name] Duffy, male   DOB: 1929/09/24, 81 y.o.   MRN: [id number]  HPI patient presents with a very painful third digit right with keratotic tissue formation and inability for him to walk comfortably   Review of Systems  All other systems reviewed and are negative.      Objective:   Physical Exam  Constitutional: He is oriented to person, place, and time.  Cardiovascular: Intact distal pulses.   Musculoskeletal: Normal range of motion.  Neurological: He is oriented to person, place, and time.  Skin: Skin is warm and dry.  Nursing note and vitals reviewed.  neurovascular status was found to be intact muscle strength was adequate with range of motion diminished subtalar midtarsal joint. Patient's noted to have a very thick distal keratotic lesion third right that's painful when pressed and is noted to have good digital perfusion and is well oriented 3     Assessment:      distal keratotic lesion third right secondary to hammertoe deformity    Plan:      H&P x-ray reviewed condition discussed and explained to patient. Today aggressive debridement accomplished along with buttress pad therapy and patient be seen back as needed and ultimately may require arthroplasty which was explained to him today

## 2015-04-14 DIAGNOSIS — H401114 Primary open-angle glaucoma, right eye, indeterminate stage: Secondary | ICD-10-CM | POA: Diagnosis not present

## 2015-04-14 DIAGNOSIS — H40012 Open angle with borderline findings, low risk, left eye: Secondary | ICD-10-CM | POA: Diagnosis not present

## 2015-05-26 DIAGNOSIS — H40012 Open angle with borderline findings, low risk, left eye: Secondary | ICD-10-CM | POA: Diagnosis not present

## 2015-05-26 DIAGNOSIS — H401114 Primary open-angle glaucoma, right eye, indeterminate stage: Secondary | ICD-10-CM | POA: Diagnosis not present

## 2015-07-19 DIAGNOSIS — I10 Essential (primary) hypertension: Secondary | ICD-10-CM | POA: Diagnosis not present

## 2015-07-19 DIAGNOSIS — R42 Dizziness and giddiness: Secondary | ICD-10-CM | POA: Diagnosis not present

## 2015-07-19 DIAGNOSIS — Z6823 Body mass index (BMI) 23.0-23.9, adult: Secondary | ICD-10-CM | POA: Diagnosis not present

## 2015-07-27 DIAGNOSIS — C61 Malignant neoplasm of prostate: Secondary | ICD-10-CM | POA: Diagnosis not present

## 2015-07-27 DIAGNOSIS — Z8546 Personal history of malignant neoplasm of prostate: Secondary | ICD-10-CM | POA: Diagnosis not present

## 2015-07-28 DIAGNOSIS — R05 Cough: Secondary | ICD-10-CM | POA: Diagnosis not present

## 2015-07-28 DIAGNOSIS — J209 Acute bronchitis, unspecified: Secondary | ICD-10-CM | POA: Diagnosis not present

## 2015-07-28 DIAGNOSIS — J029 Acute pharyngitis, unspecified: Secondary | ICD-10-CM | POA: Diagnosis not present

## 2015-07-28 DIAGNOSIS — I10 Essential (primary) hypertension: Secondary | ICD-10-CM | POA: Diagnosis not present

## 2015-07-28 DIAGNOSIS — Z6823 Body mass index (BMI) 23.0-23.9, adult: Secondary | ICD-10-CM | POA: Diagnosis not present

## 2015-09-06 DIAGNOSIS — H401114 Primary open-angle glaucoma, right eye, indeterminate stage: Secondary | ICD-10-CM | POA: Diagnosis not present

## 2015-09-06 DIAGNOSIS — H40012 Open angle with borderline findings, low risk, left eye: Secondary | ICD-10-CM | POA: Diagnosis not present

## 2015-09-06 DIAGNOSIS — H25813 Combined forms of age-related cataract, bilateral: Secondary | ICD-10-CM | POA: Diagnosis not present

## 2015-10-11 DIAGNOSIS — M25561 Pain in right knee: Secondary | ICD-10-CM | POA: Diagnosis not present

## 2015-10-11 DIAGNOSIS — R2689 Other abnormalities of gait and mobility: Secondary | ICD-10-CM | POA: Diagnosis not present

## 2015-10-11 DIAGNOSIS — Z6823 Body mass index (BMI) 23.0-23.9, adult: Secondary | ICD-10-CM | POA: Diagnosis not present

## 2015-10-22 DIAGNOSIS — M25562 Pain in left knee: Secondary | ICD-10-CM | POA: Diagnosis not present

## 2015-10-22 DIAGNOSIS — M170 Bilateral primary osteoarthritis of knee: Secondary | ICD-10-CM | POA: Diagnosis not present

## 2015-10-22 DIAGNOSIS — M1711 Unilateral primary osteoarthritis, right knee: Secondary | ICD-10-CM | POA: Diagnosis not present

## 2015-10-22 DIAGNOSIS — M25561 Pain in right knee: Secondary | ICD-10-CM | POA: Diagnosis not present

## 2015-10-22 DIAGNOSIS — M1712 Unilateral primary osteoarthritis, left knee: Secondary | ICD-10-CM | POA: Diagnosis not present

## 2015-11-23 DIAGNOSIS — M170 Bilateral primary osteoarthritis of knee: Secondary | ICD-10-CM | POA: Diagnosis not present

## 2015-11-26 DIAGNOSIS — M170 Bilateral primary osteoarthritis of knee: Secondary | ICD-10-CM | POA: Diagnosis not present

## 2015-11-30 DIAGNOSIS — M170 Bilateral primary osteoarthritis of knee: Secondary | ICD-10-CM | POA: Diagnosis not present

## 2015-12-01 DIAGNOSIS — R1032 Left lower quadrant pain: Secondary | ICD-10-CM | POA: Diagnosis not present

## 2015-12-03 DIAGNOSIS — M170 Bilateral primary osteoarthritis of knee: Secondary | ICD-10-CM | POA: Diagnosis not present

## 2015-12-07 DIAGNOSIS — M170 Bilateral primary osteoarthritis of knee: Secondary | ICD-10-CM | POA: Diagnosis not present

## 2015-12-10 DIAGNOSIS — M170 Bilateral primary osteoarthritis of knee: Secondary | ICD-10-CM | POA: Diagnosis not present

## 2015-12-14 DIAGNOSIS — M170 Bilateral primary osteoarthritis of knee: Secondary | ICD-10-CM | POA: Diagnosis not present

## 2015-12-16 DIAGNOSIS — M170 Bilateral primary osteoarthritis of knee: Secondary | ICD-10-CM | POA: Diagnosis not present

## 2015-12-18 DIAGNOSIS — Z23 Encounter for immunization: Secondary | ICD-10-CM | POA: Diagnosis not present

## 2015-12-21 DIAGNOSIS — M1712 Unilateral primary osteoarthritis, left knee: Secondary | ICD-10-CM | POA: Diagnosis not present

## 2015-12-21 DIAGNOSIS — M1711 Unilateral primary osteoarthritis, right knee: Secondary | ICD-10-CM | POA: Diagnosis not present

## 2015-12-21 DIAGNOSIS — R2681 Unsteadiness on feet: Secondary | ICD-10-CM | POA: Diagnosis not present

## 2016-02-02 DIAGNOSIS — E784 Other hyperlipidemia: Secondary | ICD-10-CM | POA: Diagnosis not present

## 2016-02-02 DIAGNOSIS — Z125 Encounter for screening for malignant neoplasm of prostate: Secondary | ICD-10-CM | POA: Diagnosis not present

## 2016-02-02 DIAGNOSIS — I10 Essential (primary) hypertension: Secondary | ICD-10-CM | POA: Diagnosis not present

## 2016-03-02 DIAGNOSIS — Z1212 Encounter for screening for malignant neoplasm of rectum: Secondary | ICD-10-CM | POA: Diagnosis not present

## 2016-03-08 DIAGNOSIS — H401114 Primary open-angle glaucoma, right eye, indeterminate stage: Secondary | ICD-10-CM | POA: Diagnosis not present

## 2016-03-08 DIAGNOSIS — H40012 Open angle with borderline findings, low risk, left eye: Secondary | ICD-10-CM | POA: Diagnosis not present

## 2016-03-08 DIAGNOSIS — H25813 Combined forms of age-related cataract, bilateral: Secondary | ICD-10-CM | POA: Diagnosis not present

## 2016-06-13 DIAGNOSIS — N393 Stress incontinence (female) (male): Secondary | ICD-10-CM | POA: Diagnosis not present

## 2016-06-13 DIAGNOSIS — Z8546 Personal history of malignant neoplasm of prostate: Secondary | ICD-10-CM | POA: Diagnosis not present

## 2016-06-14 DIAGNOSIS — C61 Malignant neoplasm of prostate: Secondary | ICD-10-CM | POA: Diagnosis not present

## 2016-07-27 ENCOUNTER — Encounter: Payer: Self-pay | Admitting: Gastroenterology

## 2016-07-27 ENCOUNTER — Ambulatory Visit (INDEPENDENT_AMBULATORY_CARE_PROVIDER_SITE_OTHER): Payer: Medicare Other | Admitting: Gastroenterology

## 2016-07-27 VITALS — BP 112/80 | HR 69 | Ht 74.0 in | Wt 180.0 lb

## 2016-07-27 DIAGNOSIS — R194 Change in bowel habit: Secondary | ICD-10-CM

## 2016-07-27 NOTE — Patient Instructions (Signed)
If you are age 81 or older, your body mass index should be between 23-30. Your Body mass index is 23.11 kg/m. If this is out of the aforementioned range listed, please consider follow up with your Primary Care Provider.  If you are age 75 or younger, your body mass index should be between 19-25. Your Body mass index is 23.11 kg/m. If this is out of the aformentioned range listed, please consider follow up with your Primary Care Provider.   You have been given a handout on daily fiber supplements.  Please contact Dr. Havery Moros in 2 weeks if you are not feeling better.  Thank you.

## 2016-07-27 NOTE — Progress Notes (Signed)
HPI :  83 y/o male with a with history of prostate cancer, HTN, HLD,  here for a new patient visit for bowel habits changes. Last seen in 2010 by Dr. Olevia Perches.   He reports some changes in his bowels for 6 weeks or so. He reports he periodically has some leakage of stool, he reports he passes gas and then notes some stool in his underwear. Wears pads due to urinary incontinence from prostate surgery. Most of the time he notes just a small amount of loose stool. No blood in the stool. He reports leakage occurs most days of the week for the past 6 weeks. He reports he has one normal BM per day. Normal form. He denies any new medications. He has tried some immodium which he thinks has helped, but causes constipation and doesn't like the way it makes him feel. No weight loss. Eating well, denies abdominal pains, otherwise feels well.  History of prostatectomy for prostate cancer, he had no history of radiation therapy.   Colonoscopy 07/14/08 - diverticulosis, one small adenoma, one small hyperplastic polyp.     Past Medical History:  Diagnosis Date  . High cholesterol   . Hypertension   . Prostate cancer [facility])      Past Surgical History:  Procedure Laterality Date  . HERNIA REPAIR    . PROSTATECTOMY     Family History  Problem Relation Age of Onset  . Hypertension Other    Social History  Substance Use Topics  . Smoking status: Never Smoker  . Smokeless tobacco: Not on file  . Alcohol use No   Current Outpatient Prescriptions  Medication Sig Dispense Refill  . ALPRAZolam (XANAX) 0.5 MG tablet TAKE (1) TABLET TWICE A DAY AS NEEDED.    . B Complex Vitamins (VITAMIN B COMPLEX PO) Take 1 capsule by mouth daily.    . citalopram (CELEXA) 20 MG tablet     . latanoprost (XALATAN) 0.005 % ophthalmic solution 1 drop.    Marland Kitchen losartan (COZAAR) 100 MG tablet Take 100 mg by mouth.    . Probiotic Product (ALIGN PO) Take 1 capsule by mouth daily.    . simvastatin (ZOCOR) 20 MG tablet Take 20 mg  by mouth.     No current facility-administered medications for this visit.    Allergies  Allergen Reactions  . Rofecoxib Swelling    REACTION: swelling     Review of Systems: All systems reviewed and negative except where noted in HPI.    Physical Exam: BP 112/80   Pulse 69   Ht 6\' 2"  (1.88 m)   Wt 180 lb (81.6 kg)   BMI 23.11 kg/m  Constitutional: Pleasant,well-developed, male in no acute distress. HEENT: Normocephalic and atraumatic. Conjunctivae are normal. No scleral icterus. Neck supple.  Cardiovascular: Normal rate, regular rhythm.  Pulmonary/chest: Effort normal and breath sounds normal. No wheezing, rales or rhonchi. Abdominal: Soft, nondistended, nontender.  There are no masses palpable. No hepatomegaly. DRE - decreased resting tone with mild decrease in squeeze, no mass lesions Extremities: no edema Lymphadenopathy: No cervical adenopathy noted. Neurological: Alert and oriented to person place and time. Skin: Skin is warm and dry. No rashes noted. Psychiatric: Normal mood and affect. Behavior is normal.   ASSESSMENT AND PLAN: 83 year old male with a history of prostate cancer treated with prostatectomy, here for new patient visit for changes in bowels, fecal leakage.   Symptoms are relatively new. He has some decreased resting tone of the anal sphincter along with mild  decrease in squeeze pressure, which could be related to his symptoms. I discussed options with him. Recommend a daily fiber supplement to help bulk stools and prevent leakage. We discussed a consultation for pelvic floor physical therapist to strengthen anal sphincter tone, and consideration for endoscopic evaluation. He wants to try daily fiber supplement first to see if this helps and avoid additional testing / treatment if possible. If in 2 weeks he has no improvement I asked him to call me back for reassessment and consideration for floor PT and flexible sigmoidoscopy. He does have a history of  small adenoma 8 years ago, however he wishes to forgo further surveillance colonoscopy given his age, which is very reasonable. He agreed to contact me in a few weeks if no improvement.  Drain Cellar, MD [facility] Pager [phone]  CC: Burnard Bunting, MD

## 2016-08-02 ENCOUNTER — Telehealth: Payer: Self-pay | Admitting: Gastroenterology

## 2016-08-02 NOTE — Telephone Encounter (Signed)
Thanks for the message. Sorry to hear his symptoms have not resolved.  I thought he had low resting and squeeze pressure on rectal exam in clinic. He may benefit from pelvic floor PT to strengthen these muscles, especially since he also has urinary incontinence, I think this may be a beneficial consult even for the urinary continence issue. Can you please help coordinate for him? I would also consider a flex sig in his case to ensure no mucosal pathology if he is willing to consider this. We don't need to sedate him for it and doesn't need bowel prep, regular flex sig instructions. thanks

## 2016-08-02 NOTE — Telephone Encounter (Signed)
Spoke to patient he states that for the first few days the fiber once a day was helping to bulk up his stool and did not have any incontinence. For the past couple days it has gone back to the way it was, despite taking fiber supplement daily. Please advise.

## 2016-08-02 NOTE — Telephone Encounter (Signed)
Let patient know about Dr. Doyne Keel recommendations. Patient was already aware of pelvic floor muscle exercises from previous prostate surgery. He will start to do those to help with tone. He was not interested in going to PT. He will also switch to a different fiber supplement, Metamucil to see if this makes a difference.  Patient did want to get scheduled for the flex sig, and is scheduled for 6/18, made note that no sedation would be needed.

## 2016-08-10 ENCOUNTER — Ambulatory Visit (AMBULATORY_SURGERY_CENTER): Payer: Self-pay | Admitting: *Deleted

## 2016-08-10 VITALS — Ht 72.75 in | Wt 182.0 lb

## 2016-08-10 DIAGNOSIS — R194 Change in bowel habit: Secondary | ICD-10-CM

## 2016-08-10 NOTE — Progress Notes (Signed)
No egg or soy allergy known to patient  No issues with past sedation with any surgeries  or procedures, no intubation problems  No diet pills per patient No home 02 use per patient  No blood thinners per patient  Pt denies issues with constipation  No A fib or A flutter  EMMI video not sent as pt has no e mail   Pt states the daily metamucil is working great- no soiling- having daily BM's with no issues   Per last TE with Dr Havery Moros, pt will not require sedation for this procedure - pt aware of this in PV

## 2016-08-23 ENCOUNTER — Telehealth: Payer: Self-pay | Admitting: Gastroenterology

## 2016-08-23 NOTE — Telephone Encounter (Signed)
Tells me he is feeling better and wants to postpone for now. Says he will call back to reschedule if symptoms return.

## 2016-08-29 DIAGNOSIS — M47816 Spondylosis without myelopathy or radiculopathy, lumbar region: Secondary | ICD-10-CM | POA: Diagnosis not present

## 2016-09-04 ENCOUNTER — Other Ambulatory Visit: Payer: Medicare Other | Admitting: Gastroenterology

## 2016-09-06 DIAGNOSIS — H401114 Primary open-angle glaucoma, right eye, indeterminate stage: Secondary | ICD-10-CM | POA: Diagnosis not present

## 2016-09-06 DIAGNOSIS — H401111 Primary open-angle glaucoma, right eye, mild stage: Secondary | ICD-10-CM | POA: Diagnosis not present

## 2016-09-12 ENCOUNTER — Encounter: Payer: Self-pay | Admitting: Gastroenterology

## 2016-09-13 ENCOUNTER — Encounter: Payer: Self-pay | Admitting: Gastroenterology

## 2016-09-13 ENCOUNTER — Ambulatory Visit (AMBULATORY_SURGERY_CENTER): Payer: Medicare Other | Admitting: Gastroenterology

## 2016-09-13 VITALS — BP 150/93 | HR 73 | Temp 97.1°F | Resp 11 | Ht 72.75 in | Wt 182.0 lb

## 2016-09-13 DIAGNOSIS — R194 Change in bowel habit: Secondary | ICD-10-CM | POA: Diagnosis not present

## 2016-09-13 MED ORDER — SODIUM CHLORIDE 0.9 % IV SOLN: 500.0000 mL | INTRAVENOUS | Status: DC

## 2016-09-13 NOTE — Patient Instructions (Signed)
Discharge instructions given. Handouts on diverticulosis and hemorrhoids. Resume previous medications. YOU HAD AN ENDOSCOPIC PROCEDURE TODAY AT THE [facility]:   Refer to the procedure report that was given to you for any specific questions about what was found during the examination.  If the procedure report does not answer your questions, please call your gastroenterologist to clarify.  If you requested that your care partner not be given the details of your procedure findings, then the procedure report has been included in a sealed envelope for you to review at your convenience later.  YOU SHOULD EXPECT: Some feelings of bloating in the abdomen. Passage of more gas than usual.  Walking can help get rid of the air that was put into your GI tract during the procedure and reduce the bloating. If you had a lower endoscopy (such as a colonoscopy or flexible sigmoidoscopy) you may notice spotting of blood in your stool or on the toilet paper. If you underwent a bowel prep for your procedure, you may not have a normal bowel movement for a few days.  Please Note:  You might notice some irritation and congestion in your nose or some drainage.  This is from the oxygen used during your procedure.  There is no need for concern and it should clear up in a day or so.  SYMPTOMS TO REPORT IMMEDIATELY:   Following lower endoscopy (colonoscopy or flexible sigmoidoscopy):  Excessive amounts of blood in the stool  Significant tenderness or worsening of abdominal pains  Swelling of the abdomen that is new, acute  Fever of 100F or higher   For urgent or emergent issues, a gastroenterologist can be reached at any hour by calling [phone].   DIET:  We do recommend a small meal at first, but then you may proceed to your regular diet.  Drink plenty of fluids but you should avoid alcoholic beverages for 24 hours.  ACTIVITY:  You should plan to take it easy for the rest of today and you should  NOT DRIVE or use heavy machinery until tomorrow (because of the sedation medicines used during the test).    FOLLOW UP: Our staff will call the number listed on your records the next business day following your procedure to check on you and address any questions or concerns that you may have regarding the information given to you following your procedure. If we do not reach you, we will leave a message.  However, if you are feeling well and you are not experiencing any problems, there is no need to return our call.  We will assume that you have returned to your regular daily activities without incident.  If any biopsies were taken you will be contacted by phone or by letter within the next 1-3 weeks.  Please call us at [phone] if you have not heard about the biopsies in 3 weeks.    SIGNATURES/CONFIDENTIALITY: You and/or your care partner have signed paperwork which will be entered into your electronic medical record.  These signatures attest to the fact that that the information above on your After Visit Summary has been reviewed and is understood.  Full responsibility of the confidentiality of this discharge information lies with you and/or your care-partner. 

## 2016-09-13 NOTE — Op Note (Signed)
[facility] Patient Name: [name] Duffy Procedure Date: 09/13/2016 10:56 AM MRN: [id number] Endoscopist: Remo Lipps P. Kalyani Maeda MD, MD Age: 83 Referring MD:  Date of Birth: [birth year] Gender: Male Account #: [id number] Procedure:                Flexible Sigmoidoscopy Indications:              Incontinence of feces Medicines:                None Procedure:                Pre-Anesthesia Assessment:                           - Prior to the procedure, a History and Physical                            was performed, and patient medications and                            allergies were reviewed. The patient's tolerance of                            previous anesthesia was also reviewed. The risks                            and benefits of the procedure and the sedation                            options and risks were discussed with the patient.                            All questions were answered, and informed consent                            was obtained. Prior Anticoagulants: The patient has                            taken no previous anticoagulant or antiplatelet                            agents. ASA Grade Assessment: II - A patient with                            mild systemic disease. After reviewing the risks                            and benefits, the patient was deemed in                            satisfactory condition to undergo the procedure.                           After obtaining informed consent, the scope was  passed under direct vision. The Colonoscope was                            introduced through the anus and advanced to the the                            sigmoid colon. The flexible sigmoidoscopy was                            accomplished without difficulty. The patient                            tolerated the procedure well. The quality of the                            bowel preparation was good. Scope In: Scope  Out: Findings:                 The perianal exam findings include mildly decreased                            resting tone.                           Many medium-mouthed diverticula were found in the                            sigmoid colon.                           Internal hemorrhoids were found during                            retroflexion. The hemorrhoids were small.                           The exam was otherwise without abnormality.                           Biopsies for histology were taken with a cold                            forceps from the sigmoid colon for evaluation of                            microscopic colitis. Complications:            No immediate complications. Estimated blood loss:                            Minimal. Estimated Blood Loss:     Estimated blood loss was minimal. Impression:               - Mildly decreased resting tone found on perianal                            exam.                           -  Diverticulosis in the sigmoid colon.                           - Internal hemorrhoids.                           - The examination was otherwise normal.                           - Biopsies were taken with a cold forceps from the                            sigmoid colon for evaluation of microscopic colitis. Recommendation:           - Discharge patient to home.                           - Resume previous diet.                           - Continue present medications.                           - Continue daily fiber supplement                           - Await pathology results.                           - If symptoms persist and biopsies negative,                            consider referral to pelvic floor PT Jarrod Bodkins P. Jesseca Marsch MD, MD 09/13/2016 11:18:40 AM This report has been signed electronically.

## 2016-09-13 NOTE — Progress Notes (Signed)
Report to PACU, RN, vss, BBS= Clear.  

## 2016-09-13 NOTE — Progress Notes (Signed)
Called to room to assist during endoscopic procedure.  Patient ID and intended procedure confirmed with present staff. Received instructions for my participation in the procedure from the performing physician.  

## 2016-09-14 ENCOUNTER — Telehealth: Payer: Self-pay

## 2016-09-14 NOTE — Telephone Encounter (Signed)
  Follow up Call-  Call back number 09/13/2016  Post procedure Call Back phone  # [phone]  Permission to leave phone message Yes  Some recent data might be hidden     Patient questions:  Do you have a fever, pain , or abdominal swelling? No. Pain Score  0 *  Have you tolerated food without any problems? Yes.    Have you been able to return to your normal activities? Yes.    Do you have any questions about your discharge instructions: Diet   No. Medications  No. Follow up visit  No.  Do you have questions or concerns about your Care? No.  Actions: * If pain score is 4 or above: No action needed, pain <4.

## 2016-09-22 ENCOUNTER — Other Ambulatory Visit: Payer: Self-pay

## 2016-09-22 DIAGNOSIS — R159 Full incontinence of feces: Secondary | ICD-10-CM

## 2016-09-25 ENCOUNTER — Telehealth: Payer: Self-pay | Admitting: Gastroenterology

## 2016-09-25 NOTE — Telephone Encounter (Signed)
Spoke with pt and he is aware, see result note. Pt received call regarding PT and will set up appointments for PT.

## 2016-10-09 ENCOUNTER — Ambulatory Visit: Payer: Medicare Other | Attending: Gastroenterology | Admitting: Physical Therapy

## 2016-10-09 DIAGNOSIS — M6281 Muscle weakness (generalized): Secondary | ICD-10-CM | POA: Diagnosis not present

## 2016-10-09 DIAGNOSIS — R279 Unspecified lack of coordination: Secondary | ICD-10-CM | POA: Diagnosis not present

## 2016-10-09 NOTE — Patient Instructions (Addendum)
Quick Contraction: Gravity Eliminated (Side-Lying)    Lie on left side, hips and knees slightly bent. Quickly squeeze then fully relax pelvic floor. Perform _5__ sets of _1__. Rest for _5__ seconds between sets. Do _3__ times a day.   Copyright  VHI. All rights reserved.  Slow Contraction: Gravity Eliminated (Side-Lying)    Lie on left side, hips and knees slightly bent. Slowly squeeze pelvic floor for _5__ seconds. Rest for _5__ seconds. Repeat _10__ times. Do 3___ times a day.   Copyright  VHI. All rights reserved.   go to the bathroom around 3 PM and sit for 15 min to see if you need to go.  [facility] [address], [place], [place] [address] Phone # [phone] Fax [phone]

## 2016-10-09 NOTE — Therapy (Signed)
[facility] [address] [place], [place], [address] Phone: [phone]   Fax:  [phone]  Physical Therapy Evaluation  Patient Details  Name: [name] Duffy MRN: [id number] Date of Birth: [birth year] Referring Provider: Dr. Renelda Loma Armbruster  Encounter Date: 10/09/2016      PT End of Session - 10/09/16 1313    Visit Number 1   Number of Visits 10   Date for PT Re-Evaluation 12/04/16   Authorization Type medicare g-code 10th visit   PT Start Time 1230   PT Stop Time 1310   PT Time Calculation (min) 40 min   Activity Tolerance Patient tolerated treatment well   Behavior During Therapy [facility] for tasks assessed/performed      Past Medical History:  Diagnosis Date  . Anxiety   . Arthritis    hands  . Blood transfusion without reported diagnosis    pt thinks had blood trnsfusion at prostate ca surgery   . Glaucoma   . High cholesterol   . Hypertension   . Prostate cancer [facility])     Past Surgical History:  Procedure Laterality Date  . COLONOSCOPY    . HERNIA REPAIR    . PROSTATECTOMY      There were no vitals filed for this visit.       Subjective Assessment - 10/09/16 1234    Subjective Patient reports 6 months ago he started to leaving skid marks in his underwear and when he passes gas. The metamucil has helped alot. Patient has a full stool in the monrning and at 3PM gets the urge and sometimes does not make it to the bathroom.  Patient has small amount that is not firm. Patient wears a brief during the day.    Patient Stated Goals eliminate the afternoon fecal leakage   Currently in Pain? No/denies            [facility] PT Assessment - 10/09/16 0001      Assessment   Medical Diagnosis R15.5 incontinence of feces, unspecified fecal incontinence   Referring Provider Dr. Renelda Loma Armbruster   Onset Date/Surgical Date 04/20/16   Prior Therapy yes after prostate cancer     Precautions    Precautions Other (comment)   Precaution Comments history of prostate cancer without chemotherapy or radiation     Restrictions   Weight Bearing Restrictions No     Balance Screen   Has the patient fallen in the past 6 months No   Has the patient had a decrease in activity level because of a fear of falling?  No   Is the patient reluctant to leave their home because of a fear of falling?  No     Home Ecologist residence     Prior Function   Level of Independence Independent   Vocation Retired     Associate Professor   Overall Cognitive Status Within Functional Limits for tasks assessed     Observation/Other Assessments   Focus on Therapeutic Outcomes (FOTO)  therapist discretion is 40% limitation due fecal leakage     Posture/Postural Control   Posture/Postural Control Postural limitations   Postural Limitations Rounded Shoulders;Forward head     ROM / Strength   AROM / PROM / Strength AROM;Strength     AROM   Overall AROM  Within functional limits for tasks performed     Strength   Right Hip Extension 4/5   Right Hip ABduction 3+/5   Left  Hip Extension 4/5   Left Hip ABduction 3+/5            Objective measurements completed on examination: See above findings.        Pelvic Floor Special Questions - 10/09/16 0001    Urinary Leakage Yes   Pad use 1 brief per day   Fecal incontinence Yes  happens around 3PM   Pelvic Floor Internal Exam Patient confirms identification and approves PT to assess muscle integrity   Exam Type Rectal   Palpation decreased contraction of the puborectalis; vc to not contract gluteal and not hold breathe   Strength weak squeeze, no lift   Strength # of seconds 5                  PT Education - 10/09/16 1316    Education provided Yes   Education Details pelvic floor exercises in sidely; go to the bathroom at 3PM for a bowel movement   Person(s) Educated Patient   Methods  Explanation;Demonstration;Handout;Verbal cues   Comprehension Returned demonstration;Verbalized understanding          PT Short Term Goals - 10/09/16 1322      PT SHORT TERM GOAL #1   Title independent with initial HEP   Time 4   Period Weeks   Status New   Target Date 11/06/16     PT SHORT TERM GOAL #2   Title fecal incontinence decreased >/= 25% around 3PM due to increased strength   Time 4   Period Weeks   Status New   Target Date 11/06/16           PT Long Term Goals - 10/09/16 1325      PT LONG TERM GOAL #1   Title independent with HEP and understand how to progress himself   Time 8   Period Weeks   Status New   Target Date 12/04/16     PT LONG TERM GOAL #2   Title pelvic floor strength >/= 4/5 so fecal incontinence decreased >/= 75%   Time 8   Period Weeks   Status New   Target Date 12/04/16     PT LONG TERM GOAL #3   Title ability to correct his posture to contract pelvic floor correctly and not hold his breath   Time 8   Period Weeks   Status New   Target Date 12/04/16                Plan - 10/09/16 1317    Clinical Impression Statement Patient is a 83 year old male with fecal incontinence for the past 6 months.  Patient reports he has been taking the Metamucil which is helping.  Patient reports he has one good full bowel movement in the morning but will have fecal incontinence around 3PM  when he has the urge or pass gas.  Pelvic floor strength is 2/5 with decreased contraction of the puborectalis and will hold his breath.  Bilateral hip strength for extension and abduction is weak. Patient sits with a flexed posture and forward head.  Patient will benefit from skilled therapy to improve pelvic floor strength and hip strength so he is able to reduce fecal incontinence.    History and Personal Factors relevant to plan of care: s/p prostate cancer; Hernia repair; hearing loss   Clinical Presentation Stable   Clinical Presentation due to: stable  condition   Clinical Decision Making Low   Rehab Potential Excellent   PT Frequency 1x /  week   PT Duration 8 weeks   PT Treatment/Interventions Biofeedback;Therapeutic activities;Therapeutic exercise;Neuromuscular re-education;Patient/family education;Manual techniques   PT Next Visit Plan pelvic floor exercise in sitting with pelvic EMG; breathing exercise to reduce holding his breath, hip exercises   PT Home Exercise Plan progress as needed   Consulted and Agree with Plan of Care Patient      Patient will benefit from skilled therapeutic intervention in order to improve the following deficits and impairments:  Decreased coordination, Impaired tone, Decreased endurance, Decreased activity tolerance, Decreased strength  Visit Diagnosis: Muscle weakness (generalized) - Plan: PT plan of care cert/re-cert  Unspecified lack of coordination - Plan: PT plan of care cert/re-cert      G-Codes - 57/97/28 1327    Functional Assessment Tool Used (Outpatient Only) therapist discretion with 40% limitation due to fecal leakage  goal is 30%   Functional Limitation Other PT primary   Other PT Primary Current Status (A0601) At least 40 percent but less than 60 percent impaired, limited or restricted   Other PT Primary Goal Status (V6153) At least 20 percent but less than 40 percent impaired, limited or restricted       Problem List Patient Active Problem List   Diagnosis Date Noted  . 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 Open angle with borderline findings, low risk, unspecified eye 10/12/2014  . Primary open angle glaucoma 10/12/2014  . Anxiety state 07/23/2013  . Blood in the urine 01/08/2013  . Genuine stress incontinence, male 12/13/2011  . H/O malignant neoplasm of prostate 01/28/2011   Earlie Counts, PT 10/09/16 1:30 PM   [facility] [address], [place], [place], [address] Phone: [phone]   Fax:   [phone]  Name: ELIGIO ANGERT MRN: [id number] Date of Birth: 27-Dec-1929

## 2016-10-13 DIAGNOSIS — H40012 Open angle with borderline findings, low risk, left eye: Secondary | ICD-10-CM | POA: Diagnosis not present

## 2016-10-13 DIAGNOSIS — H401111 Primary open-angle glaucoma, right eye, mild stage: Secondary | ICD-10-CM | POA: Diagnosis not present

## 2016-10-13 DIAGNOSIS — H25813 Combined forms of age-related cataract, bilateral: Secondary | ICD-10-CM | POA: Diagnosis not present

## 2016-10-25 ENCOUNTER — Encounter: Payer: Self-pay | Admitting: Physical Therapy

## 2016-10-25 ENCOUNTER — Ambulatory Visit: Payer: Medicare Other | Attending: Gastroenterology | Admitting: Physical Therapy

## 2016-10-25 DIAGNOSIS — R279 Unspecified lack of coordination: Secondary | ICD-10-CM | POA: Insufficient documentation

## 2016-10-25 DIAGNOSIS — M6281 Muscle weakness (generalized): Secondary | ICD-10-CM

## 2016-10-25 NOTE — Patient Instructions (Addendum)
Quick Contraction: Gravity Resisted (Sitting)    Sitting, quickly squeeze then fully relax pelvic floor. Perform _1__ sets of _5__. Rest for _1__ seconds between sets. Do _3__ times a day.  Copyright  VHI. All rights reserved.  Slow Contraction: Gravity Resisted (Sitting)    Sitting, slowly squeeze pelvic floor for _10__ seconds. Rest for _5__ seconds. Repeat _10__ times. Do _3__ times a day.  Copyright  VHI. All rights reserved.  ABDUCTION: Standing (Active)    Stand, feet flat. Lift right leg out to side. Squeeze your anus when doing the exercise.  Complete __1_ sets of _10__ repetitions. Do on both legs. Perform _1__ sessions per day.  http://gtsc.exer.us/110   Copyright  VHI. All rights reserved.  [facility] [address] [place], [place] [address] Phone # [phone] Fax [phone]

## 2016-10-25 NOTE — Therapy (Signed)
[facility] [address], [place], [place], [address] Phone: [phone]   Fax:  [phone]  Physical Therapy Treatment  Patient Details  Name: [name] Duffy MRN: [id number] Date of Birth: [birth year] Referring Provider: Dr. Renelda Loma Armbruster  Encounter Date: 10/25/2016      PT End of Session - 10/25/16 1226    Visit Number 2   Number of Visits 10   Date for PT Re-Evaluation 12/04/16   Authorization Type medicare g-code 10th visit   PT Start Time 5597   PT Stop Time 1225   PT Time Calculation (min) 40 min   Activity Tolerance Patient tolerated treatment well   Behavior During Therapy [facility] for tasks assessed/performed      Past Medical History:  Diagnosis Date  . Anxiety   . Arthritis    hands  . Blood transfusion without reported diagnosis    pt thinks had blood trnsfusion at prostate ca surgery   . Glaucoma   . High cholesterol   . Hypertension   . Prostate cancer [facility])     Past Surgical History:  Procedure Laterality Date  . COLONOSCOPY    . HERNIA REPAIR    . PROSTATECTOMY      There were no vitals filed for this visit.      Subjective Assessment - 10/25/16 1150    Subjective No skid marks in my underwear. The fiber is helping me. Patient is doing a full stool and able to make it to the bathroom. I have not had to make any emergency trips to the bathroom.    Patient Stated Goals eliminate the afternoon fecal leakage   Currently in Pain? No/denies   Multiple Pain Sites No                      Pelvic Floor Special Questions - 10/25/16 0001    Tone contract for 10 seconds and relax in sitting; Patient will fatique after 5 seconds; standing with pelvic floor contraction with hip abduction  resting tone is 4 uv   Biofeedback --  sitting   Biofeedback sensor type Surface  anal   Biofeedback Activity 10 second hold;Quick contraction                   PT  Education - 10/25/16 1226    Education provided Yes   Education Details pelvic floor exercises in sitting and standing with hip abduction   Person(s) Educated Patient   Methods Explanation;Demonstration;Verbal cues;Handout   Comprehension Returned demonstration;Verbalized understanding          PT Short Term Goals - 10/25/16 1153      PT SHORT TERM GOAL #1   Title independent with initial HEP   Time 4   Period Weeks   Status Achieved     PT SHORT TERM GOAL #2   Title fecal incontinence decreased >/= 25% around 3PM due to increased strength   Time 4   Period Weeks   Status Achieved           PT Long Term Goals - 10/09/16 1325      PT LONG TERM GOAL #1   Title independent with HEP and understand how to progress himself   Time 8   Period Weeks   Status New   Target Date 12/04/16     PT LONG TERM GOAL #2   Title pelvic floor strength >/= 4/5 so fecal incontinence decreased >/= 75%  Time 8   Period Weeks   Status New   Target Date 12/04/16     PT LONG TERM GOAL #3   Title ability to correct his posture to contract pelvic floor correctly and not hold his breath   Time 8   Period Weeks   Status New   Target Date 12/04/16               Plan - 10/25/16 1227    Clinical Impression Statement Patient is able to contract his pelvic floor consistently in sitting for 5 seconds. Patient will show some fatique with 10 second contraction.  Patient resting tone averages 4 uv.  Patient is able to perform quick flicks but has some trouble with fully relaxing.  Patient has met all of his STG's.  Patient is having full bowel movements that are solid.  Patient reports no skid marks on his pads since last visit. Patient will benefit from skilled therapy to improve pelvic floor strnegth and hip strength so he is able to reduce fecal incontinence.    Rehab Potential Excellent   PT Frequency 1x / week   PT Duration 8 weeks   PT Treatment/Interventions Biofeedback;Therapeutic  activities;Therapeutic exercise;Neuromuscular re-education;Patient/family education;Manual techniques   PT Next Visit Plan  breathing exercise to reduce holding his breath, hip exercises   PT Home Exercise Plan progress as needed   Consulted and Agree with Plan of Care Patient      Patient will benefit from skilled therapeutic intervention in order to improve the following deficits and impairments:  Decreased coordination, Impaired tone, Decreased endurance, Decreased activity tolerance, Decreased strength  Visit Diagnosis: Muscle weakness (generalized)  Unspecified lack of coordination     Problem List Patient Active Problem List   Diagnosis Date Noted  . 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 Open angle with borderline findings, low risk, unspecified eye 10/12/2014  . Primary open angle glaucoma 10/12/2014  . Anxiety state 07/23/2013  . Blood in the urine 01/08/2013  . Genuine stress incontinence, male 12/13/2011  . H/O malignant neoplasm of prostate 01/28/2011    Earlie Counts, PT 10/25/16 12:30 PM    [facility] [address] [place], [place], [address] Phone: [phone]   Fax:  [phone]  Name: KELDRICK POMPLUN MRN: [id number] Date of Birth: Jan 15, 1930

## 2016-11-01 ENCOUNTER — Ambulatory Visit: Payer: Medicare Other | Admitting: Physical Therapy

## 2016-11-01 ENCOUNTER — Encounter: Payer: Self-pay | Admitting: Physical Therapy

## 2016-11-01 DIAGNOSIS — R279 Unspecified lack of coordination: Secondary | ICD-10-CM | POA: Diagnosis not present

## 2016-11-01 DIAGNOSIS — M6281 Muscle weakness (generalized): Secondary | ICD-10-CM

## 2016-11-01 NOTE — Patient Instructions (Signed)
   Put the rubber band around your knees; open up your legs, contract the pelvic floor,  and hold for a count of 10 seconds; you should feel the resistance. Do 15 times 1 time per day.      Sit in a chair. Place band around your thighs and march alternating legs with pelvic floor contraction. 15 times 1 time per day.   [facility] [address] [place], [place] [address] Phone # [phone] Fax [phone]

## 2016-11-01 NOTE — Therapy (Signed)
[facility] [address], [place], [place], [address] Phone: [phone]   Fax:  [phone]  Physical Therapy Treatment  Patient Details  Name: [name] Duffy MRN: [id number] Date of Birth: [birth year] Referring Provider: Dr. Renelda Loma Armbruster  Encounter Date: 11/01/2016      PT End of Session - 11/01/16 1258    Visit Number 3   Number of Visits 10   Date for PT Re-Evaluation 12/04/16   Authorization Type medicare g-code 10th visit   PT Start Time 10  treatment consisted alot of talking with patient that did not pertain to therapy due to being and elderly gentleman   PT Stop Time 1308   PT Time Calculation (min) 38 min   Activity Tolerance Patient tolerated treatment well   Behavior During Therapy [facility] for tasks assessed/performed      Past Medical History:  Diagnosis Date  . Anxiety   . Arthritis    hands  . Blood transfusion without reported diagnosis    pt thinks had blood trnsfusion at prostate ca surgery   . Glaucoma   . High cholesterol   . Hypertension   . Prostate cancer [facility])     Past Surgical History:  Procedure Laterality Date  . COLONOSCOPY    . HERNIA REPAIR    . PROSTATECTOMY      There were no vitals filed for this visit.      Subjective Assessment - 11/01/16 1232    Subjective I feel better.  I have not had loose stool in my underwear.  Patient is having a full bowel movement.  I ahve too many bowel movement with mucus. Patient will have alot of gas and muscus will come out.  Patient has not soiled himself with stool.  Patient has one full stool per day and 2 mini stools per day.    Patient Stated Goals eliminate the afternoon fecal leakage   Currently in Pain? No/denies                         [facility] Adult PT Treatment/Exercise - 11/01/16 0001      Neuro Re-ed    Neuro Re-ed Details  pelvic floor contraction in sitting 10x ; sitting with ball squeeze   contract pelvic floor; hip abduction with                  PT Education - 11/01/16 1258    Education provided Yes   Education Details hip abduction and marching in a chair with band   Person(s) Educated Patient   Methods Explanation;Demonstration;Verbal cues;Handout   Comprehension Returned demonstration;Verbalized understanding          PT Short Term Goals - 10/25/16 1153      PT SHORT TERM GOAL #1   Title independent with initial HEP   Time 4   Period Weeks   Status Achieved     PT SHORT TERM GOAL #2   Title fecal incontinence decreased >/= 25% around 3PM due to increased strength   Time 4   Period Weeks   Status Achieved           PT Long Term Goals - 11/01/16 1237      PT LONG TERM GOAL #1   Title independent with HEP and understand how to progress himself   Time 8   Period Weeks   Status On-going     PT LONG TERM GOAL #2  Title pelvic floor strength >/= 4/5 so fecal incontinence decreased >/= 75%   Time 8   Period Weeks   Status On-going     PT LONG TERM GOAL #3   Title ability to correct his posture to contract pelvic floor correctly and not hold his breath   Time 8   Period Weeks   Status On-going               Plan - 11/01/16 1307    Clinical Impression Statement Patient reports no more fecal leakage but as he passess gas will leak muscus. Patient is able to have 1 good bowel movement and 2 small ones during the day.  Patient feels himself work the pelvic floor. Patient will benefit from skilled therapy to improve pelvic floor strength and hip strength so he is able to reduce fecal incontinence.    Rehab Potential Excellent   PT Frequency 1x / week   PT Duration 8 weeks   PT Treatment/Interventions Biofeedback;Therapeutic activities;Therapeutic exercise;Neuromuscular re-education;Patient/family education;Manual techniques   PT Next Visit Plan discharge to HEP next visit   PT Home Exercise Plan progress as needed   Recommended  Other Services MD signed initial cert on 0/76/8088   Consulted and Agree with Plan of Care Patient      Patient will benefit from skilled therapeutic intervention in order to improve the following deficits and impairments:  Decreased coordination, Impaired tone, Decreased endurance, Decreased activity tolerance, Decreased strength  Visit Diagnosis: Muscle weakness (generalized)  Unspecified lack of coordination     Problem List Patient Active Problem List   Diagnosis Date Noted  . 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 Open angle with borderline findings, low risk, unspecified eye 10/12/2014  . Primary open angle glaucoma 10/12/2014  . Anxiety state 07/23/2013  . Blood in the urine 01/08/2013  . Genuine stress incontinence, male 12/13/2011  . H/O malignant neoplasm of prostate 01/28/2011    Earlie Counts, PT 11/01/16 1:12 PM    [facility] [address] [place], [place], [address] Phone: [phone]   Fax:  [phone]  Name: [name] Duffy MRN: [id number] Date of Birth: 1930-02-28

## 2016-11-08 ENCOUNTER — Encounter: Payer: Self-pay | Admitting: Physical Therapy

## 2016-11-08 ENCOUNTER — Ambulatory Visit: Payer: Medicare Other | Admitting: Physical Therapy

## 2016-11-08 DIAGNOSIS — M6281 Muscle weakness (generalized): Secondary | ICD-10-CM

## 2016-11-08 DIAGNOSIS — R279 Unspecified lack of coordination: Secondary | ICD-10-CM

## 2016-11-08 NOTE — Therapy (Signed)
[facility] [address] [place], [place], [address] Phone: [phone]   Fax:  [phone]  Physical Therapy Treatment  Patient Details  Name: [name] Duffy MRN: [id number] Date of Birth: August 30, 1929 Referring Provider: Dr. Renelda Loma Armbruster  Encounter Date: 11/08/2016      PT End of Session - 11/08/16 1159    Visit Number 4   Number of Visits 10   Date for PT Re-Evaluation 01/29/17   Authorization Type medicare g-code 10th visit   PT Start Time 0737   PT Stop Time 1226   PT Time Calculation (min) 38 min   Activity Tolerance Patient tolerated treatment well   Behavior During Therapy [facility] for tasks assessed/performed      Past Medical History:  Diagnosis Date  . Anxiety   . Arthritis    hands  . Blood transfusion without reported diagnosis    pt thinks had blood trnsfusion at prostate ca surgery   . Glaucoma   . High cholesterol   . Hypertension   . Prostate cancer [facility])     Past Surgical History:  Procedure Laterality Date  . COLONOSCOPY    . HERNIA REPAIR    . PROSTATECTOMY      There were no vitals filed for this visit.      Subjective Assessment - 11/08/16 1152    Subjective I am having full bowel movements.  I have had several blowouts in my pants. Sometimes it is mucus or a full BM. My diet is regular. I feel like I have been losing weight. Left groin pain began several days ago suddenly.    Patient Stated Goals eliminate the afternoon fecal leakage   Currently in Pain? Yes   Pain Score 5    Pain Location Groin   Pain Orientation Left   Pain Descriptors / Indicators Dull   Pain Type Acute pain   Pain Onset In the past 7 days   Pain Frequency Intermittent   Aggravating Factors  no activities aggravate the pain or with bowel movements   Multiple Pain Sites No            [facility] PT Assessment - 11/08/16 0001      Assessment   Medical Diagnosis R15.5 incontinence of feces,  unspecified fecal incontinence   Onset Date/Surgical Date 04/20/16   Prior Therapy yes after prostate cancer     Precautions   Precautions Other (comment)   Precaution Comments history of prostate cancer without chemotherapy or radiation     Restrictions   Weight Bearing Restrictions No     [place] residence     Prior Function   Level of [place] Retired     Associate Professor   Overall Cognitive Status Within Functional Limits for tasks assessed     Observation/Other Assessments   Focus on Therapeutic Outcomes (FOTO)  therapist discretion is 40% limitation due fecal leakage     Posture/Postural Control   Posture/Postural Control Postural limitations   Postural Limitations Rounded Shoulders;Forward head     ROM / Strength   AROM / PROM / Strength Strength;AROM     AROM   Overall AROM  Within functional limits for tasks performed     Strength   Right Hip Extension 5/5   Right Hip ABduction 4/5   Left Hip Extension 5/5   Left Hip ABduction 4/5     Transfers   Transfers Not assessed  Ambulation/Gait   Ambulation/Gait No                  Pelvic Floor Special Questions - 11/08/16 0001    Urinary Leakage Yes  does not realize he is leaking   Pad use 3 briefs per day  BM or muscous or both in pads   Fecal incontinence Yes  happens around 3PM; 2 times per day   Strength weak squeeze, no lift           [facility] Adult PT Treatment/Exercise - 11/08/16 0001      Self-Care   Self-Care Other Self-Care Comments   Other Self-Care Comments  discussed patient diet and cutting fiber to 1/2 glass per day                PT Education - 11/08/16 1211    Education provided Yes   Education Details past exercise program and reviewed   Person(s) Educated Patient   Methods Explanation;Demonstration;Verbal cues;Handout   Comprehension Verbalized understanding;Returned demonstration          PT  Short Term Goals - 10/25/16 1153      PT SHORT TERM GOAL #1   Title independent with initial HEP   Time 4   Period Weeks   Status Achieved     PT SHORT TERM GOAL #2   Title fecal incontinence decreased >/= 25% around 3PM due to increased strength   Time 4   Period Weeks   Status Achieved           PT Long Term Goals - 11/01/16 1237      PT LONG TERM GOAL #1   Title independent with HEP and understand how to progress himself   Time 8   Period Weeks   Status On-going     PT LONG TERM GOAL #2   Title pelvic floor strength >/= 4/5 so fecal incontinence decreased >/= 75%   Time 8   Period Weeks   Status On-going     PT LONG TERM GOAL #3   Title ability to correct his posture to contract pelvic floor correctly and not hold his breath   Time 8   Period Weeks   Status On-going               Plan - 11/08/16 1223    Clinical Impression Statement Patient is now changing his pad 3 times instead of 1 time due to fecal leakage will be a small or large amount.  This is a change since last visit. Patient is consistent with his HEP.  Patient hip strength has improved.  Patient continues to sit with a flexed posture.  Patient has a consistent diet.  Patient has met his STG's and is working toward long term goals.  Patient will benefit from skilled therapy to increase pelvic floor strength and control.    Rehab Potential Excellent   PT Frequency 1x / week   PT Duration 8 weeks   PT Treatment/Interventions Biofeedback;Therapeutic activities;Therapeutic exercise;Neuromuscular re-education;Patient/family education;Manual techniques   PT Next Visit Plan Progress pelvic floor contraction with movements   PT Home Exercise Plan progress as needed   Recommended Other Services Sent in recert to MD       Patient will benefit from skilled therapeutic intervention in order to improve the following deficits and impairments:  Decreased coordination, Impaired tone, Decreased endurance,  Decreased activity tolerance, Decreased strength  Visit Diagnosis: Muscle weakness (generalized) - Plan: PT plan of care cert/re-cert  Unspecified  lack of coordination - Plan: PT plan of care cert/re-cert     Problem List Patient Active Problem List   Diagnosis Date Noted  . 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 Open angle with borderline findings, low risk, unspecified eye 10/12/2014  . Primary open angle glaucoma 10/12/2014  . Anxiety state 07/23/2013  . Blood in the urine 01/08/2013  . Genuine stress incontinence, male 12/13/2011  . H/O malignant neoplasm of prostate 01/28/2011    Earlie Counts, PT 11/08/16 12:33 PM   [facility] [address], [place], [place], [address] Phone: [phone]   Fax:  [phone]  Name: [name] Duffy MRN: [id number] Date of Birth: 04-Apr-1929

## 2016-11-08 NOTE — Patient Instructions (Addendum)
Physical Therapy      Put the rubber band around your knees; open up your legs, contract the pelvic floor,  and hold for a count of 10 seconds; you should feel the resistance. Do 15 times 1 time per day.      Sit in a chair. Place band around your thighs and march alternating legs with pelvic floor contraction. 15 times 1 time per day.      Quick Contraction: Gravity Resisted (Sitting)    Sitting, quickly squeeze then fully relax pelvic floor. Perform _1__ sets of _5__. Rest for _1__ seconds between sets. Do _3__ times a day.  Copyright  VHI. All rights reserved.  Slow Contraction: Gravity Resisted (Sitting)    Sitting, slowly squeeze pelvic floor for _10__ seconds. Rest for _5__ seconds. Repeat _10__ times. Do _3__ times a day.  Copyright  VHI. All rights reserved.  ABDUCTION: Standing (Active)    Stand, feet flat. Lift right leg out to side. Squeeze your anus when doing the exercise.  Complete __1_ sets of _10__ repetitions. Do on both legs. Perform _1__ sessions per day.  http://gtsc.exer.us/110    Introduction to Bowel Health Diet and daily habits can help you predict when your bowels will move on a regular basis.  The consistency and quantity of the stool is usually more important than the frequency.  The goal is to have a regular bowel movement that is soft but formed.   Tips on Emptying Regularly . Eat breakfast.  Usually the best time of day for a bowel movement will be a half hour to an hour after eating.  These times are best because the body uses the gastrocolic reflex, a stimulation of bowel motion that occurs with eating, to help produce a bowel movement.  For some people even a simple hot drink in the morning can help the reflex action begin. . Eat all your meals at a predictable time each day.  The bowel functions best when food is introduced at the same regular intervals. . The amount of food eaten at a given time of day should be about the same size  from day to day.  The bowel functions best when food is introduced in similar quantities from day to day. It is fine to have a small breakfast and a large lunch, or vice versa, just be consistent. . Eat two servings of fruit or vegetables and at least one serving of a complex carbohydrates (whole grains such as brown rice, bran, whole wheat bread, or oatmeal) at each meal. . Drink plenty of water-ideally eight glasses a day.  Be sure to increase your water intake if you are increasing fiber into your diet.  Maintain Healthy Habits . Exercise daily.  You may exercise at any time of day, but you may find that bowel function is helped most if the exercise is at a consistent time each day. . Make sure that you are not rushed and have convenient access to a bathroom at your selected time to empty your bowels. [facility] [address], [place], [place] [address] Phone # [phone] Fax [phone]

## 2016-12-25 ENCOUNTER — Ambulatory Visit: Payer: Medicare Other | Admitting: Physical Therapy

## 2017-01-04 ENCOUNTER — Encounter: Payer: Self-pay | Admitting: Physical Therapy

## 2017-01-04 ENCOUNTER — Ambulatory Visit: Payer: Medicare Other | Attending: Gastroenterology | Admitting: Physical Therapy

## 2017-01-04 DIAGNOSIS — R279 Unspecified lack of coordination: Secondary | ICD-10-CM | POA: Insufficient documentation

## 2017-01-04 DIAGNOSIS — M6281 Muscle weakness (generalized): Secondary | ICD-10-CM | POA: Diagnosis not present

## 2017-01-04 DIAGNOSIS — Z23 Encounter for immunization: Secondary | ICD-10-CM | POA: Diagnosis not present

## 2017-01-04 NOTE — Therapy (Signed)
[facility] [address], [place], [place], [address] Phone: [phone]   Fax:  [phone]  Physical Therapy Treatment  Patient Details  Name: [name] Duffy MRN: [id number] Date of Birth: 20-Aug-1929 Referring Provider: Dr. Renelda Loma Armbruster  Encounter Date: 01/04/2017      PT End of Session - 01/04/17 1232    Visit Number 5   Number of Visits 10   Date for PT Re-Evaluation 01/29/17   Authorization Type medicare g-code 10th visit   PT Start Time 53   PT Stop Time 1310  talked alot with patient about life so unable to charge fully   PT Time Calculation (min) 40 min   Activity Tolerance Patient tolerated treatment well   Behavior During Therapy [facility] for tasks assessed/performed      Past Medical History:  Diagnosis Date  . Anxiety   . Arthritis    hands  . Blood transfusion without reported diagnosis    pt thinks had blood trnsfusion at prostate ca surgery   . Glaucoma   . High cholesterol   . Hypertension   . Prostate cancer [name])     Past Surgical History:  Procedure Laterality Date  . COLONOSCOPY    . HERNIA REPAIR    . PROSTATECTOMY      There were no vitals filed for this visit.      Subjective Assessment - 01/04/17 1234    Subjective I am better.  I am not pooping in my pants.  It is a rare thing for me to do that.    Patient Stated Goals eliminate the afternoon fecal leakage   Currently in Pain? No/denies   Multiple Pain Sites No                         [facility] Adult PT Treatment/Exercise - 01/04/17 0001      Self-Care   Self-Care Other Self-Care Comments   Other Self-Care Comments  reviewed diet, bowel habits, incontinence, fecal leakage.      Lumbar Exercises: Seated   Sit to Stand 10 reps   Sit to Stand Limitations contract pelvic floor, leakaed 1 time with activity, vc to flex hips, has to keep back of legs against the mast for stability.    Other  Seated Lumbar Exercises sitting clam with red band 20x;    Other Seated Lumbar Exercises sitting pelvic floor contraction hold 10 sec 10x                  PT Short Term Goals - 10/25/16 1153      PT SHORT TERM GOAL #1   Title independent with initial HEP   Time 4   Period Weeks   Status Achieved     PT SHORT TERM GOAL #2   Title fecal incontinence decreased >/= 25% around 3PM due to increased strength   Time 4   Period Weeks   Status Achieved           PT Long Term Goals - 01/04/17 1235      PT LONG TERM GOAL #1   Title independent with HEP and understand how to progress himself   Time 8   Period Weeks   Status Achieved     PT LONG TERM GOAL #2   Title pelvic floor strength >/= 4/5 so fecal incontinence decreased >/= 75%   Time 8   Period Weeks   Status On-going  PT LONG TERM GOAL #3   Title ability to correct his posture to contract pelvic floor correctly and not hold his breath   Time 8   Period Weeks   Status Achieved               Plan - 01/04/17 1239    Clinical Impression Statement Patietn goes 3 times per day with one time having a full large bowel movement.  patient is now changing his guard 2 times per day instead of 4 for urinary leakage.  Patient fecal leakage is 75% better.  Patient reports his urinary leakage is 30% better.  Patient will benefit from skilled therapy to increase pelvic floor strength and improve control.    Rehab Potential Excellent   PT Frequency 1x / week   PT Duration 8 weeks   PT Treatment/Interventions Biofeedback;Therapeutic activities;Therapeutic exercise;Neuromuscular re-education;Patient/family education;Manual techniques   PT Next Visit Plan Progress pelvic floor contraction with movements   PT Home Exercise Plan progress as needed   Recommended Other Services MD signed recert   Consulted and Agree with Plan of Care Patient      Patient will benefit from skilled therapeutic intervention in order to  improve the following deficits and impairments:  Decreased coordination, Impaired tone, Decreased endurance, Decreased activity tolerance, Decreased strength  Visit Diagnosis: Muscle weakness (generalized)  Unspecified lack of coordination     Problem List Patient Active Problem List   Diagnosis Date Noted  . 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 Open angle with borderline findings, low risk, unspecified eye 10/12/2014  . Primary open angle glaucoma 10/12/2014  . Anxiety state 07/23/2013  . Blood in the urine 01/08/2013  . Genuine stress incontinence, male 12/13/2011  . H/O malignant neoplasm of prostate 01/28/2011    [name] Duffy, PT 01/04/17 1:59 PM   [facility] [address], [place], [place], [address] Phone: [phone]   Fax:  [phone]  Name: [name] Duffy MRN: [id number] Date of Birth: Nov 27, 1929

## 2017-01-08 DIAGNOSIS — R05 Cough: Secondary | ICD-10-CM | POA: Diagnosis not present

## 2017-01-08 DIAGNOSIS — Z6824 Body mass index (BMI) 24.0-24.9, adult: Secondary | ICD-10-CM | POA: Diagnosis not present

## 2017-01-16 DIAGNOSIS — R29898 Other symptoms and signs involving the musculoskeletal system: Secondary | ICD-10-CM | POA: Diagnosis not present

## 2017-01-16 DIAGNOSIS — K458 Other specified abdominal hernia without obstruction or gangrene: Secondary | ICD-10-CM | POA: Diagnosis not present

## 2017-01-16 DIAGNOSIS — C61 Malignant neoplasm of prostate: Secondary | ICD-10-CM | POA: Diagnosis not present

## 2017-01-16 DIAGNOSIS — E782 Mixed hyperlipidemia: Secondary | ICD-10-CM | POA: Diagnosis not present

## 2017-01-16 DIAGNOSIS — I10 Essential (primary) hypertension: Secondary | ICD-10-CM | POA: Diagnosis not present

## 2017-01-29 ENCOUNTER — Encounter: Payer: Self-pay | Admitting: Physical Therapy

## 2017-01-29 ENCOUNTER — Ambulatory Visit: Payer: Medicare Other | Attending: Gastroenterology | Admitting: Physical Therapy

## 2017-01-29 DIAGNOSIS — M6281 Muscle weakness (generalized): Secondary | ICD-10-CM | POA: Diagnosis not present

## 2017-01-29 DIAGNOSIS — R279 Unspecified lack of coordination: Secondary | ICD-10-CM | POA: Diagnosis not present

## 2017-01-29 NOTE — Therapy (Signed)
[facility] [address] [place], [place], [address] Phone: [phone]   Fax:  [phone]  Physical Therapy Treatment  Patient Details  Name: [name] Duffy MRN: [id number] Date of Birth: [birth year] Referring Provider: Dr. Renelda Loma Armbruster   Encounter Date: 01/29/2017  PT End of Session - 01/29/17 1249    Visit Number  6    Date for PT Re-Evaluation  01/29/17    Authorization Type  medicare g-code 10th visit    PT Start Time  1230    PT Stop Time  1308    PT Time Calculation (min)  38 min    Activity Tolerance  Patient tolerated treatment well    Behavior During Therapy  [facility] for tasks assessed/performed       Past Medical History:  Diagnosis Date  . Anxiety   . Arthritis    hands  . Blood transfusion without reported diagnosis    pt thinks had blood trnsfusion at prostate ca surgery   . Glaucoma   . High cholesterol   . Hypertension   . Prostate cancer [facility])     Past Surgical History:  Procedure Laterality Date  . COLONOSCOPY    . HERNIA REPAIR    . PROSTATECTOMY      There were no vitals filed for this visit.  Subjective Assessment - 01/29/17 1235    Subjective  I am doing well. I still have some problems with urine.  My bowels is 80% better .  Patient does a good BM everyday. I do not have loose bowels anymore.  I do not like the rubber band exercises. I am not pooping all over myself.     Patient Stated Goals  eliminate the afternoon fecal leakage    Currently in Pain?  No/denies         [facility] PT Assessment - 01/29/17 0001      Assessment   Medical Diagnosis  R15.5 incontinence of feces, unspecified fecal incontinence    Onset Date/Surgical Date  04/20/16    Prior Therapy  yes after prostate cancer      Precautions   Precautions  Other (comment)    Precaution Comments  history of prostate cancer without chemotherapy or radiation      Restrictions   Weight Bearing  Restrictions  No      Balance Screen   Has the patient fallen in the past 6 months  No    Has the patient had a decrease in activity level because of a fear of falling?   No    Is the patient reluctant to leave their home because of a fear of falling?   No      Home Film/video editor residence      Prior Function   Level of Independence  Independent    Vocation  Retired      Associate Professor   Overall Cognitive Status  Within Functional Limits for tasks assessed      Observation/Other Assessments   Focus on Therapeutic Outcomes (FOTO)   therapist discretion is 20% limitation due fecal leakage      Posture/Postural Control   Posture/Postural Control  Postural limitations    Postural Limitations  Rounded Shoulders;Forward head      AROM   Overall AROM   Within functional limits for tasks performed      Strength   Right Hip Extension  5/5    Right Hip ABduction  5/5  Left Hip Extension  5/5    Left Hip ABduction  5/5      Transfers   Transfers  Not assessed      Ambulation/Gait   Ambulation/Gait  No               Pelvic Floor Special Questions - 01/29/17 0001    Urinary Leakage  Yes does not realize he is leaking    Pad use  3 briefs per day due to urine leakage, not the fecal leakage    Fecal incontinence  Yes 80% better        [facility] PT Treatment/Exercise - 01/29/17 0001      Self-Care   Self-Care  Other Self-Care Comments    Other Self-Care Comments   reviewed diet, bowel habits, incontinence, fecal leakage.       Exercises   Exercises  Other Exercises    Other Exercises   reviewed his current HEP and able to return demonstration correctly      Lumbar Exercises: Seated   Sit to Stand  10 reps    Sit to Stand Limitations  contract pelvic floor, leakaed 1 time with activity, vc to flex hips, has to keep back of legs against the mast for stability.     Other Seated Lumbar Exercises  sitting clam with red band 20x;     Other  Seated Lumbar Exercises  sitting pelvic floor contraction hold 10 sec 10x             PT Education - 01/29/17 1313    Education provided  Yes    Education Details  reviewed HEP and patient is independent     Person(s) Educated  Patient    Methods  Explanation;Demonstration    Comprehension  Verbalized understanding;Returned demonstration       PT Short Term Goals - 10/25/16 1153      PT SHORT TERM GOAL #1   Title  independent with initial HEP    Time  4    Period  Weeks    Status  Achieved      PT SHORT TERM GOAL #2   Title  fecal incontinence decreased >/= 25% around 3PM due to increased strength    Time  4    Period  Weeks    Status  Achieved        PT Long Term Goals - 01/29/17 1238      PT LONG TERM GOAL #1   Title  independent with HEP and understand how to progress himself    Time  8    Period  Weeks    Status  Achieved      PT LONG TERM GOAL #2   Title  pelvic floor strength >/= 4/5 so fecal incontinence decreased >/= 75%    Time  8    Period  Weeks    Status  Achieved      PT LONG TERM GOAL #3   Title  ability to correct his posture to contract pelvic floor correctly and not hold his breath    Time  8    Period  Weeks    Status  Achieved            Plan - 01/29/17 1249    Clinical Impression Statement  Patient has 1 good bowel movement and 2 small bowel movement per day.  Patient reports the urinary leakage has improved by 50% and fecal leakage is 80% better.  Patient has increased pelvic  floor strength si 4/5 due to improve spincter strength. Patient is independent with his HEP.  Patient only wears his pull up to bed instead of pull up plus guard for urinary leakage. Patient has full bilateral hip strength. . Patient is ready for discharge.     Rehab Potential  Excellent    PT Treatment/Interventions  Biofeedback;Therapeutic activities;Therapeutic exercise;Neuromuscular re-education;Patient/family education;Manual techniques    PT Next Visit  Plan  Discharge to current HEP this visit    PT Home Exercise Plan  Current HEP    Consulted and Agree with Plan of Care  Patient       Patient will benefit from skilled therapeutic intervention in order to improve the following deficits and impairments:  Decreased coordination, Impaired tone, Decreased endurance, Decreased activity tolerance, Decreased strength  Visit Diagnosis: Muscle weakness (generalized)  Unspecified lack of coordination   G-Codes - 2017/02/09 1253    Functional Assessment Tool Used (Outpatient Only)  therapist discretion with 20% limitation due to fecal leakage    Functional Limitation  Other PT primary    Other PT Primary Goal Status (B5670)  At least 20 percent but less than 40 percent impaired, limited or restricted    Other PT Primary Discharge Status (L4103)  At least 20 percent but less than 40 percent impaired, limited or restricted       Problem List Patient Active Problem List   Diagnosis Date Noted  . 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 Open angle with borderline findings, low risk, unspecified eye 10/12/2014  . Primary open angle glaucoma 10/12/2014  . Anxiety state 07/23/2013  . Blood in the urine 01/08/2013  . Genuine stress incontinence, male 12/13/2011  . H/O malignant neoplasm of prostate 01/28/2011    Earlie Counts, PT 09-Feb-2017 1:15 PM   [facility] [address], [place], [place], [address] Phone: [phone]   Fax:  [phone]  Name: [name] Duffy MRN: [id number] Date of Birth: 1929/09/09  PHYSICAL THERAPY DISCHARGE SUMMARY  Visits from Start of Care: 6  Current functional level related to goals / functional outcomes: See above.    Remaining deficits: See above.    Education / Equipment: HEP Plan: Patient agrees to discharge.  Patient goals were met. Patient is being discharged due to meeting the stated rehab goals.  Thank you for the  referral. Earlie Counts, PT 09-Feb-2017 1:15 PM  ?????

## 2017-02-05 DIAGNOSIS — I10 Essential (primary) hypertension: Secondary | ICD-10-CM | POA: Diagnosis not present

## 2017-02-05 DIAGNOSIS — E7849 Other hyperlipidemia: Secondary | ICD-10-CM | POA: Diagnosis not present

## 2017-02-05 DIAGNOSIS — R82998 Other abnormal findings in urine: Secondary | ICD-10-CM | POA: Diagnosis not present

## 2017-02-05 DIAGNOSIS — Z125 Encounter for screening for malignant neoplasm of prostate: Secondary | ICD-10-CM | POA: Diagnosis not present

## 2017-02-19 DIAGNOSIS — M1990 Unspecified osteoarthritis, unspecified site: Secondary | ICD-10-CM | POA: Diagnosis not present

## 2017-02-19 DIAGNOSIS — M6281 Muscle weakness (generalized): Secondary | ICD-10-CM | POA: Diagnosis not present

## 2017-02-19 DIAGNOSIS — C61 Malignant neoplasm of prostate: Secondary | ICD-10-CM | POA: Diagnosis not present

## 2017-02-19 DIAGNOSIS — M546 Pain in thoracic spine: Secondary | ICD-10-CM | POA: Diagnosis not present

## 2017-02-19 DIAGNOSIS — E782 Mixed hyperlipidemia: Secondary | ICD-10-CM | POA: Diagnosis not present

## 2017-02-19 DIAGNOSIS — K458 Other specified abdominal hernia without obstruction or gangrene: Secondary | ICD-10-CM | POA: Diagnosis not present

## 2017-02-19 DIAGNOSIS — R0600 Dyspnea, unspecified: Secondary | ICD-10-CM | POA: Diagnosis not present

## 2017-02-19 DIAGNOSIS — R2681 Unsteadiness on feet: Secondary | ICD-10-CM | POA: Diagnosis not present

## 2017-02-21 DIAGNOSIS — Z1212 Encounter for screening for malignant neoplasm of rectum: Secondary | ICD-10-CM | POA: Diagnosis not present

## 2017-02-28 DIAGNOSIS — M1990 Unspecified osteoarthritis, unspecified site: Secondary | ICD-10-CM | POA: Diagnosis not present

## 2017-02-28 DIAGNOSIS — E782 Mixed hyperlipidemia: Secondary | ICD-10-CM | POA: Diagnosis not present

## 2017-02-28 DIAGNOSIS — M546 Pain in thoracic spine: Secondary | ICD-10-CM | POA: Diagnosis not present

## 2017-02-28 DIAGNOSIS — R0600 Dyspnea, unspecified: Secondary | ICD-10-CM | POA: Diagnosis not present

## 2017-02-28 DIAGNOSIS — R2681 Unsteadiness on feet: Secondary | ICD-10-CM | POA: Diagnosis not present

## 2017-02-28 DIAGNOSIS — M6281 Muscle weakness (generalized): Secondary | ICD-10-CM | POA: Diagnosis not present

## 2017-03-02 DIAGNOSIS — R2681 Unsteadiness on feet: Secondary | ICD-10-CM | POA: Diagnosis not present

## 2017-03-02 DIAGNOSIS — M6281 Muscle weakness (generalized): Secondary | ICD-10-CM | POA: Diagnosis not present

## 2017-03-02 DIAGNOSIS — M546 Pain in thoracic spine: Secondary | ICD-10-CM | POA: Diagnosis not present

## 2017-03-02 DIAGNOSIS — M1990 Unspecified osteoarthritis, unspecified site: Secondary | ICD-10-CM | POA: Diagnosis not present

## 2017-03-02 DIAGNOSIS — E782 Mixed hyperlipidemia: Secondary | ICD-10-CM | POA: Diagnosis not present

## 2017-03-02 DIAGNOSIS — R0600 Dyspnea, unspecified: Secondary | ICD-10-CM | POA: Diagnosis not present

## 2017-03-05 DIAGNOSIS — M6281 Muscle weakness (generalized): Secondary | ICD-10-CM | POA: Diagnosis not present

## 2017-03-05 DIAGNOSIS — M546 Pain in thoracic spine: Secondary | ICD-10-CM | POA: Diagnosis not present

## 2017-03-05 DIAGNOSIS — E782 Mixed hyperlipidemia: Secondary | ICD-10-CM | POA: Diagnosis not present

## 2017-03-05 DIAGNOSIS — M1990 Unspecified osteoarthritis, unspecified site: Secondary | ICD-10-CM | POA: Diagnosis not present

## 2017-03-05 DIAGNOSIS — R0600 Dyspnea, unspecified: Secondary | ICD-10-CM | POA: Diagnosis not present

## 2017-03-05 DIAGNOSIS — R2681 Unsteadiness on feet: Secondary | ICD-10-CM | POA: Diagnosis not present

## 2017-03-07 DIAGNOSIS — M6281 Muscle weakness (generalized): Secondary | ICD-10-CM | POA: Diagnosis not present

## 2017-03-07 DIAGNOSIS — M546 Pain in thoracic spine: Secondary | ICD-10-CM | POA: Diagnosis not present

## 2017-03-07 DIAGNOSIS — R2681 Unsteadiness on feet: Secondary | ICD-10-CM | POA: Diagnosis not present

## 2017-03-07 DIAGNOSIS — R0600 Dyspnea, unspecified: Secondary | ICD-10-CM | POA: Diagnosis not present

## 2017-03-07 DIAGNOSIS — M1990 Unspecified osteoarthritis, unspecified site: Secondary | ICD-10-CM | POA: Diagnosis not present

## 2017-03-07 DIAGNOSIS — E782 Mixed hyperlipidemia: Secondary | ICD-10-CM | POA: Diagnosis not present

## 2017-03-19 DIAGNOSIS — R2681 Unsteadiness on feet: Secondary | ICD-10-CM | POA: Diagnosis not present

## 2017-03-19 DIAGNOSIS — M546 Pain in thoracic spine: Secondary | ICD-10-CM | POA: Diagnosis not present

## 2017-03-19 DIAGNOSIS — M6281 Muscle weakness (generalized): Secondary | ICD-10-CM | POA: Diagnosis not present

## 2017-03-19 DIAGNOSIS — R0600 Dyspnea, unspecified: Secondary | ICD-10-CM | POA: Diagnosis not present

## 2017-03-19 DIAGNOSIS — E782 Mixed hyperlipidemia: Secondary | ICD-10-CM | POA: Diagnosis not present

## 2017-03-19 DIAGNOSIS — M1990 Unspecified osteoarthritis, unspecified site: Secondary | ICD-10-CM | POA: Diagnosis not present

## 2017-03-21 DIAGNOSIS — E782 Mixed hyperlipidemia: Secondary | ICD-10-CM | POA: Diagnosis not present

## 2017-03-21 DIAGNOSIS — C61 Malignant neoplasm of prostate: Secondary | ICD-10-CM | POA: Diagnosis not present

## 2017-03-21 DIAGNOSIS — M1990 Unspecified osteoarthritis, unspecified site: Secondary | ICD-10-CM | POA: Diagnosis not present

## 2017-03-21 DIAGNOSIS — M546 Pain in thoracic spine: Secondary | ICD-10-CM | POA: Diagnosis not present

## 2017-03-21 DIAGNOSIS — K458 Other specified abdominal hernia without obstruction or gangrene: Secondary | ICD-10-CM | POA: Diagnosis not present

## 2017-03-21 DIAGNOSIS — M6281 Muscle weakness (generalized): Secondary | ICD-10-CM | POA: Diagnosis not present

## 2017-03-21 DIAGNOSIS — R0600 Dyspnea, unspecified: Secondary | ICD-10-CM | POA: Diagnosis not present

## 2017-04-13 ENCOUNTER — Ambulatory Visit (INDEPENDENT_AMBULATORY_CARE_PROVIDER_SITE_OTHER): Payer: Medicare Other | Admitting: Orthopaedic Surgery

## 2017-04-13 ENCOUNTER — Encounter (INDEPENDENT_AMBULATORY_CARE_PROVIDER_SITE_OTHER): Payer: Self-pay

## 2017-04-27 DIAGNOSIS — M79672 Pain in left foot: Secondary | ICD-10-CM | POA: Diagnosis not present

## 2017-04-27 DIAGNOSIS — M79671 Pain in right foot: Secondary | ICD-10-CM | POA: Diagnosis not present

## 2017-04-27 DIAGNOSIS — L84 Corns and callosities: Secondary | ICD-10-CM | POA: Diagnosis not present

## 2017-04-27 DIAGNOSIS — L89892 Pressure ulcer of other site, stage 2: Secondary | ICD-10-CM | POA: Diagnosis not present

## 2017-04-27 DIAGNOSIS — B351 Tinea unguium: Secondary | ICD-10-CM | POA: Diagnosis not present

## 2017-06-04 DIAGNOSIS — H9193 Unspecified hearing loss, bilateral: Secondary | ICD-10-CM | POA: Diagnosis not present

## 2017-06-04 DIAGNOSIS — R42 Dizziness and giddiness: Secondary | ICD-10-CM | POA: Diagnosis not present

## 2017-06-04 DIAGNOSIS — M545 Low back pain: Secondary | ICD-10-CM | POA: Diagnosis not present

## 2017-06-04 DIAGNOSIS — Z6824 Body mass index (BMI) 24.0-24.9, adult: Secondary | ICD-10-CM | POA: Diagnosis not present

## 2017-06-04 DIAGNOSIS — E7849 Other hyperlipidemia: Secondary | ICD-10-CM | POA: Diagnosis not present

## 2017-06-04 DIAGNOSIS — Z1389 Encounter for screening for other disorder: Secondary | ICD-10-CM | POA: Diagnosis not present

## 2017-06-04 DIAGNOSIS — C61 Malignant neoplasm of prostate: Secondary | ICD-10-CM | POA: Diagnosis not present

## 2017-06-04 DIAGNOSIS — R634 Abnormal weight loss: Secondary | ICD-10-CM | POA: Diagnosis not present

## 2017-06-04 DIAGNOSIS — R2689 Other abnormalities of gait and mobility: Secondary | ICD-10-CM | POA: Diagnosis not present

## 2017-06-04 DIAGNOSIS — M461 Sacroiliitis, not elsewhere classified: Secondary | ICD-10-CM | POA: Diagnosis not present

## 2017-06-04 DIAGNOSIS — R5383 Other fatigue: Secondary | ICD-10-CM | POA: Diagnosis not present

## 2017-06-04 DIAGNOSIS — I10 Essential (primary) hypertension: Secondary | ICD-10-CM | POA: Diagnosis not present

## 2017-06-11 ENCOUNTER — Other Ambulatory Visit: Payer: Self-pay | Admitting: Internal Medicine

## 2017-06-11 DIAGNOSIS — W19XXXA Unspecified fall, initial encounter: Secondary | ICD-10-CM | POA: Diagnosis not present

## 2017-06-11 DIAGNOSIS — S0990XA Unspecified injury of head, initial encounter: Secondary | ICD-10-CM | POA: Diagnosis not present

## 2017-06-11 DIAGNOSIS — M545 Low back pain: Secondary | ICD-10-CM | POA: Diagnosis not present

## 2017-06-11 DIAGNOSIS — R279 Unspecified lack of coordination: Secondary | ICD-10-CM | POA: Diagnosis not present

## 2017-06-11 DIAGNOSIS — H538 Other visual disturbances: Secondary | ICD-10-CM | POA: Diagnosis not present

## 2017-06-12 ENCOUNTER — Ambulatory Visit
Admission: RE | Admit: 2017-06-12 | Discharge: 2017-06-12 | Disposition: A | Discharge status: Home / Self Care | Payer: Medicare Other | Source: Ambulatory Visit | Attending: Internal Medicine | Admitting: Internal Medicine

## 2017-06-12 DIAGNOSIS — S0990XA Unspecified injury of head, initial encounter: Secondary | ICD-10-CM

## 2017-06-21 DIAGNOSIS — K458 Other specified abdominal hernia without obstruction or gangrene: Secondary | ICD-10-CM | POA: Diagnosis not present

## 2017-06-21 DIAGNOSIS — C61 Malignant neoplasm of prostate: Secondary | ICD-10-CM | POA: Diagnosis not present

## 2017-06-21 DIAGNOSIS — E782 Mixed hyperlipidemia: Secondary | ICD-10-CM | POA: Diagnosis not present

## 2017-06-21 DIAGNOSIS — M545 Low back pain: Secondary | ICD-10-CM | POA: Diagnosis not present

## 2017-06-21 DIAGNOSIS — R2681 Unsteadiness on feet: Secondary | ICD-10-CM | POA: Diagnosis not present

## 2017-06-21 DIAGNOSIS — M6281 Muscle weakness (generalized): Secondary | ICD-10-CM | POA: Diagnosis not present

## 2017-06-21 DIAGNOSIS — M546 Pain in thoracic spine: Secondary | ICD-10-CM | POA: Diagnosis not present

## 2017-06-21 DIAGNOSIS — M1990 Unspecified osteoarthritis, unspecified site: Secondary | ICD-10-CM | POA: Diagnosis not present

## 2017-06-21 DIAGNOSIS — Z9181 History of falling: Secondary | ICD-10-CM | POA: Diagnosis not present

## 2017-06-25 ENCOUNTER — Other Ambulatory Visit (HOSPITAL_COMMUNITY): Payer: Self-pay | Admitting: Internal Medicine

## 2017-06-25 DIAGNOSIS — R2689 Other abnormalities of gait and mobility: Secondary | ICD-10-CM | POA: Diagnosis not present

## 2017-06-25 DIAGNOSIS — R279 Unspecified lack of coordination: Secondary | ICD-10-CM

## 2017-06-25 DIAGNOSIS — M5416 Radiculopathy, lumbar region: Secondary | ICD-10-CM | POA: Diagnosis not present

## 2017-06-25 DIAGNOSIS — Z6823 Body mass index (BMI) 23.0-23.9, adult: Secondary | ICD-10-CM | POA: Diagnosis not present

## 2017-06-25 DIAGNOSIS — M542 Cervicalgia: Secondary | ICD-10-CM | POA: Diagnosis not present

## 2017-06-25 DIAGNOSIS — S0990XA Unspecified injury of head, initial encounter: Secondary | ICD-10-CM

## 2017-06-25 DIAGNOSIS — I10 Essential (primary) hypertension: Secondary | ICD-10-CM | POA: Diagnosis not present

## 2017-06-25 DIAGNOSIS — M1990 Unspecified osteoarthritis, unspecified site: Secondary | ICD-10-CM | POA: Diagnosis not present

## 2017-06-25 DIAGNOSIS — H538 Other visual disturbances: Secondary | ICD-10-CM | POA: Diagnosis not present

## 2017-06-25 DIAGNOSIS — M545 Low back pain: Secondary | ICD-10-CM | POA: Diagnosis not present

## 2017-06-26 ENCOUNTER — Ambulatory Visit (HOSPITAL_COMMUNITY)
Admission: RE | Admit: 2017-06-26 | Discharge: 2017-06-26 | Disposition: A | Discharge status: Home / Self Care | Payer: Medicare Other | Source: Ambulatory Visit | Attending: Internal Medicine | Admitting: Internal Medicine

## 2017-06-26 DIAGNOSIS — S0990XA Unspecified injury of head, initial encounter: Secondary | ICD-10-CM

## 2017-06-26 DIAGNOSIS — M5136 Other intervertebral disc degeneration, lumbar region: Secondary | ICD-10-CM | POA: Diagnosis not present

## 2017-06-26 DIAGNOSIS — M545 Low back pain: Secondary | ICD-10-CM | POA: Diagnosis not present

## 2017-06-26 DIAGNOSIS — M542 Cervicalgia: Secondary | ICD-10-CM | POA: Diagnosis not present

## 2017-06-26 DIAGNOSIS — R279 Unspecified lack of coordination: Secondary | ICD-10-CM

## 2017-06-26 DIAGNOSIS — M419 Scoliosis, unspecified: Secondary | ICD-10-CM | POA: Diagnosis not present

## 2017-06-26 DIAGNOSIS — S199XXA Unspecified injury of neck, initial encounter: Secondary | ICD-10-CM | POA: Diagnosis not present

## 2017-06-26 DIAGNOSIS — X58XXXA Exposure to other specified factors, initial encounter: Secondary | ICD-10-CM | POA: Insufficient documentation

## 2017-06-26 DIAGNOSIS — M5416 Radiculopathy, lumbar region: Secondary | ICD-10-CM

## 2017-06-26 DIAGNOSIS — I700 Atherosclerosis of aorta: Secondary | ICD-10-CM | POA: Diagnosis not present

## 2017-06-26 DIAGNOSIS — S3992XA Unspecified injury of lower back, initial encounter: Secondary | ICD-10-CM | POA: Diagnosis not present

## 2017-07-02 DIAGNOSIS — H40012 Open angle with borderline findings, low risk, left eye: Secondary | ICD-10-CM | POA: Diagnosis not present

## 2017-07-02 DIAGNOSIS — H401111 Primary open-angle glaucoma, right eye, mild stage: Secondary | ICD-10-CM | POA: Diagnosis not present

## 2017-07-02 DIAGNOSIS — H25813 Combined forms of age-related cataract, bilateral: Secondary | ICD-10-CM | POA: Diagnosis not present

## 2017-07-03 ENCOUNTER — Ambulatory Visit (HOSPITAL_COMMUNITY)
Admission: RE | Admit: 2017-07-03 | Discharge: 2017-07-03 | Disposition: A | Discharge status: Home / Self Care | Payer: Medicare Other | Source: Ambulatory Visit | Attending: Vascular Surgery | Admitting: Vascular Surgery

## 2017-07-03 ENCOUNTER — Other Ambulatory Visit (HOSPITAL_COMMUNITY): Payer: Self-pay | Admitting: Internal Medicine

## 2017-07-03 DIAGNOSIS — I708 Atherosclerosis of other arteries: Secondary | ICD-10-CM | POA: Insufficient documentation

## 2017-07-03 DIAGNOSIS — I6522 Occlusion and stenosis of left carotid artery: Secondary | ICD-10-CM | POA: Diagnosis not present

## 2017-07-03 DIAGNOSIS — R42 Dizziness and giddiness: Secondary | ICD-10-CM

## 2017-07-09 DIAGNOSIS — M47816 Spondylosis without myelopathy or radiculopathy, lumbar region: Secondary | ICD-10-CM | POA: Diagnosis not present

## 2017-07-09 DIAGNOSIS — S060X0A Concussion without loss of consciousness, initial encounter: Secondary | ICD-10-CM | POA: Diagnosis not present

## 2017-07-09 DIAGNOSIS — R2689 Other abnormalities of gait and mobility: Secondary | ICD-10-CM | POA: Diagnosis not present

## 2017-07-12 DIAGNOSIS — Z9181 History of falling: Secondary | ICD-10-CM | POA: Diagnosis not present

## 2017-07-12 DIAGNOSIS — R2681 Unsteadiness on feet: Secondary | ICD-10-CM | POA: Diagnosis not present

## 2017-07-12 DIAGNOSIS — M6281 Muscle weakness (generalized): Secondary | ICD-10-CM | POA: Diagnosis not present

## 2017-07-12 DIAGNOSIS — M546 Pain in thoracic spine: Secondary | ICD-10-CM | POA: Diagnosis not present

## 2017-07-12 DIAGNOSIS — M545 Low back pain: Secondary | ICD-10-CM | POA: Diagnosis not present

## 2017-07-12 DIAGNOSIS — M1990 Unspecified osteoarthritis, unspecified site: Secondary | ICD-10-CM | POA: Diagnosis not present

## 2017-07-18 DIAGNOSIS — E782 Mixed hyperlipidemia: Secondary | ICD-10-CM | POA: Diagnosis not present

## 2017-07-18 DIAGNOSIS — C61 Malignant neoplasm of prostate: Secondary | ICD-10-CM | POA: Diagnosis not present

## 2017-07-18 DIAGNOSIS — Z9181 History of falling: Secondary | ICD-10-CM | POA: Diagnosis not present

## 2017-07-18 DIAGNOSIS — M6281 Muscle weakness (generalized): Secondary | ICD-10-CM | POA: Diagnosis not present

## 2017-07-18 DIAGNOSIS — M546 Pain in thoracic spine: Secondary | ICD-10-CM | POA: Diagnosis not present

## 2017-07-18 DIAGNOSIS — M545 Low back pain: Secondary | ICD-10-CM | POA: Diagnosis not present

## 2017-07-18 DIAGNOSIS — R2681 Unsteadiness on feet: Secondary | ICD-10-CM | POA: Diagnosis not present

## 2017-07-18 DIAGNOSIS — K458 Other specified abdominal hernia without obstruction or gangrene: Secondary | ICD-10-CM | POA: Diagnosis not present

## 2017-07-18 DIAGNOSIS — M1990 Unspecified osteoarthritis, unspecified site: Secondary | ICD-10-CM | POA: Diagnosis not present

## 2017-07-26 ENCOUNTER — Other Ambulatory Visit: Payer: Self-pay | Admitting: Internal Medicine

## 2017-07-26 DIAGNOSIS — R2689 Other abnormalities of gait and mobility: Secondary | ICD-10-CM

## 2017-07-26 DIAGNOSIS — H538 Other visual disturbances: Secondary | ICD-10-CM

## 2017-07-26 DIAGNOSIS — R42 Dizziness and giddiness: Secondary | ICD-10-CM

## 2017-07-30 DIAGNOSIS — H40012 Open angle with borderline findings, low risk, left eye: Secondary | ICD-10-CM | POA: Diagnosis not present

## 2017-07-30 DIAGNOSIS — H401111 Primary open-angle glaucoma, right eye, mild stage: Secondary | ICD-10-CM | POA: Diagnosis not present

## 2017-07-30 DIAGNOSIS — H25813 Combined forms of age-related cataract, bilateral: Secondary | ICD-10-CM | POA: Diagnosis not present

## 2017-08-01 ENCOUNTER — Ambulatory Visit
Admission: RE | Admit: 2017-08-01 | Discharge: 2017-08-01 | Disposition: A | Discharge status: Home / Self Care | Payer: Medicare Other | Source: Ambulatory Visit | Attending: Internal Medicine | Admitting: Internal Medicine

## 2017-08-01 DIAGNOSIS — H538 Other visual disturbances: Secondary | ICD-10-CM

## 2017-08-01 DIAGNOSIS — R2689 Other abnormalities of gait and mobility: Secondary | ICD-10-CM

## 2017-08-01 DIAGNOSIS — R42 Dizziness and giddiness: Secondary | ICD-10-CM | POA: Diagnosis not present

## 2017-08-01 MED ORDER — GADOBENATE DIMEGLUMINE 529 MG/ML IV SOLN
16.0000 mL | Freq: Once | INTRAVENOUS | Status: AC | PRN
  Administered 2017-08-01: 16 mL via INTRAVENOUS

## 2017-08-07 DIAGNOSIS — M79671 Pain in right foot: Secondary | ICD-10-CM | POA: Diagnosis not present

## 2017-08-07 DIAGNOSIS — M79672 Pain in left foot: Secondary | ICD-10-CM | POA: Diagnosis not present

## 2017-08-07 DIAGNOSIS — B351 Tinea unguium: Secondary | ICD-10-CM | POA: Diagnosis not present

## 2017-08-08 DIAGNOSIS — M47816 Spondylosis without myelopathy or radiculopathy, lumbar region: Secondary | ICD-10-CM | POA: Diagnosis not present

## 2017-08-27 DIAGNOSIS — H401111 Primary open-angle glaucoma, right eye, mild stage: Secondary | ICD-10-CM | POA: Diagnosis not present

## 2017-08-27 DIAGNOSIS — H2513 Age-related nuclear cataract, bilateral: Secondary | ICD-10-CM | POA: Diagnosis not present

## 2017-08-27 DIAGNOSIS — H40012 Open angle with borderline findings, low risk, left eye: Secondary | ICD-10-CM | POA: Diagnosis not present

## 2017-08-27 DIAGNOSIS — H25013 Cortical age-related cataract, bilateral: Secondary | ICD-10-CM | POA: Diagnosis not present

## 2017-08-27 DIAGNOSIS — H2512 Age-related nuclear cataract, left eye: Secondary | ICD-10-CM | POA: Diagnosis not present

## 2017-08-30 DIAGNOSIS — N393 Stress incontinence (female) (male): Secondary | ICD-10-CM | POA: Diagnosis not present

## 2017-08-30 DIAGNOSIS — Z9079 Acquired absence of other genital organ(s): Secondary | ICD-10-CM | POA: Diagnosis not present

## 2017-08-30 DIAGNOSIS — Z8546 Personal history of malignant neoplasm of prostate: Secondary | ICD-10-CM | POA: Diagnosis not present

## 2017-09-25 DIAGNOSIS — H2512 Age-related nuclear cataract, left eye: Secondary | ICD-10-CM | POA: Diagnosis not present

## 2017-09-25 DIAGNOSIS — H25812 Combined forms of age-related cataract, left eye: Secondary | ICD-10-CM | POA: Diagnosis not present

## 2017-10-10 DIAGNOSIS — M47816 Spondylosis without myelopathy or radiculopathy, lumbar region: Secondary | ICD-10-CM | POA: Diagnosis not present

## 2017-10-15 ENCOUNTER — Emergency Department (HOSPITAL_COMMUNITY): Payer: Medicare Other

## 2017-10-15 ENCOUNTER — Encounter (HOSPITAL_COMMUNITY): Payer: Self-pay

## 2017-10-15 ENCOUNTER — Other Ambulatory Visit: Payer: Self-pay

## 2017-10-15 ENCOUNTER — Emergency Department (HOSPITAL_COMMUNITY)
Admission: EM | Admit: 2017-10-15 | Discharge: 2017-10-15 | Disposition: A | Discharge status: Home / Self Care | Payer: Medicare Other | Attending: Emergency Medicine | Admitting: Emergency Medicine

## 2017-10-15 DIAGNOSIS — Z79899 Other long term (current) drug therapy: Secondary | ICD-10-CM | POA: Insufficient documentation

## 2017-10-15 DIAGNOSIS — R42 Dizziness and giddiness: Secondary | ICD-10-CM | POA: Diagnosis not present

## 2017-10-15 DIAGNOSIS — I440 Atrioventricular block, first degree: Secondary | ICD-10-CM | POA: Diagnosis not present

## 2017-10-15 DIAGNOSIS — I4430 Unspecified atrioventricular block: Secondary | ICD-10-CM | POA: Diagnosis not present

## 2017-10-15 DIAGNOSIS — J9811 Atelectasis: Secondary | ICD-10-CM | POA: Diagnosis not present

## 2017-10-15 DIAGNOSIS — J90 Pleural effusion, not elsewhere classified: Secondary | ICD-10-CM | POA: Diagnosis not present

## 2017-10-15 DIAGNOSIS — I10 Essential (primary) hypertension: Secondary | ICD-10-CM | POA: Insufficient documentation

## 2017-10-15 DIAGNOSIS — R0902 Hypoxemia: Secondary | ICD-10-CM | POA: Diagnosis not present

## 2017-10-15 LAB — BASIC METABOLIC PANEL
ANION GAP: 10 (ref 5–15)
BUN: 17 mg/dL (ref 8–23)
CO2: 19 mmol/L — ABNORMAL LOW (ref 22–32)
Calcium: 9.4 mg/dL (ref 8.9–10.3)
Chloride: 110 mmol/L (ref 98–111)
Creatinine, Ser: 0.99 mg/dL (ref 0.61–1.24)
GFR calc Af Amer: >60 mL/min (ref >60)
Glucose, Bld: 120 mg/dL — ABNORMAL HIGH (ref 70–99)
POTASSIUM: 4.9 mmol/L (ref 3.5–5.1)
SODIUM: 139 mmol/L (ref 135–145)

## 2017-10-15 LAB — CBC
HEMATOCRIT: 39.9 % (ref 39.0–52.0)
HEMOGLOBIN: 12.7 g/dL — AB (ref 13.0–17.0)
MCH: 28.3 pg (ref 26.0–34.0)
MCHC: 31.8 g/dL (ref 30.0–36.0)
MCV: 89.1 fL (ref 78.0–100.0)
Platelets: 165 10*3/uL (ref 150–400)
RBC: 4.48 MIL/uL (ref 4.22–5.81)
RDW: 12.9 % (ref 11.5–15.5)
WBC: 7.2 10*3/uL (ref 4.0–10.5)

## 2017-10-15 LAB — CBG MONITORING, ED: Glucose-Capillary: 108 mg/dL — ABNORMAL HIGH (ref 70–99)

## 2017-10-15 LAB — TROPONIN I

## 2017-10-15 MED ORDER — LIDOCAINE 5 % EX PTCH: 1.0000 | MEDICATED_PATCH | CUTANEOUS | 0 refills | Status: DC

## 2017-10-15 MED ORDER — ONDANSETRON HCL 4 MG/2ML IJ SOLN
4.0000 mg | Freq: Once | INTRAMUSCULAR | Status: AC
  Administered 2017-10-15: 4 mg via INTRAVENOUS
  Filled 2017-10-15: qty 2

## 2017-10-15 MED ORDER — LIDOCAINE 5 % EX PTCH
1.0000 | MEDICATED_PATCH | CUTANEOUS | Status: DC
  Administered 2017-10-15: 1 via TRANSDERMAL
  Filled 2017-10-15: qty 1

## 2017-10-15 MED ORDER — MECLIZINE HCL 25 MG PO TABS
50.0000 mg | ORAL_TABLET | Freq: Once | ORAL | Status: AC
  Administered 2017-10-15: 50 mg via ORAL
  Filled 2017-10-15: qty 2

## 2017-10-15 NOTE — Discharge Instructions (Addendum)
Do not put any more vinegar or alcohol in your ear. Follow up with your doctor for recheck, return to ER as needed.

## 2017-10-15 NOTE — ED Notes (Signed)
[facility] for transport; [place] Stated they will give Voucher to [employer] for the ride

## 2017-10-15 NOTE — ED Triage Notes (Signed)
Pt brought in by [facility] from [facility] for dizziness and nausea/vomiting. Pt denies CP/SOB. Pt c/o lower neck pain following several episodes of vomiting. Pt endorses dizziness with positional changes. Pt given 8mg  zofran PTA with no relief. Pt had similar spell x1 month ago that resulted in a fall. Pt hit his head and was seen, CT head was done- per pt scan was neg. Pt A+Ox4 and in NAD at this time.

## 2017-10-15 NOTE — ED Notes (Addendum)
Pt ambulated in hallway with no acute distress, pt states he had a little dizziness

## 2017-10-15 NOTE — ED Provider Notes (Signed)
[facility] EMERGENCY DEPARTMENT Provider Note   CSN: [id number] Arrival date & time: 10/15/17  1436     History   Chief Complaint Chief Complaint  Patient presents with  . Dizziness    HPI [name] Duffy is a 84 y.o. male.  84 year old male brought in by EMS for dizziness with nausea, vomiting, diaphoresis.  Patient states that he got up this morning around 6 AM and around 730 this morning was helping to change his wife's brief when he began to feel dizzy.  Patient continued to feel unwell, went to get his mail and says he became very sweaty, went back to his room where he notified his wife and her CNA that he did not feel well and had about 4 episodes of emesis.  Patient states at that point he felt like the room was spinning.  Patient was given Zofran by EMS and feels this has helped although he still feels little bit nauseous.     Past Medical History:  Diagnosis Date  . Anxiety   . Arthritis    hands  . Blood transfusion without reported diagnosis    pt thinks had blood trnsfusion at prostate ca surgery   . Glaucoma   . High cholesterol   . Hypertension   . Prostate cancer [facility])     Patient Active Problem List   Diagnosis Date Noted  . 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 Open angle with borderline findings, low risk, unspecified eye 10/12/2014  . Primary open angle glaucoma 10/12/2014  . Anxiety state 07/23/2013  . Blood in the urine 01/08/2013  . Genuine stress incontinence, male 12/13/2011  . H/O malignant neoplasm of prostate 01/28/2011    Past Surgical History:  Procedure Laterality Date  . COLONOSCOPY    . HERNIA REPAIR    . PROSTATECTOMY          Home Medications    Prior to Admission medications   Medication Sig Start Date End Date Taking? Authorizing Provider  ALPRAZolam Duanne Moron) 0.5 MG tablet TAKE (1) TABLET TWICE A DAY AS NEEDED. 10/29/14   [provider]  B Complex Vitamins (VITAMIN B COMPLEX PO)  Take 1 capsule by mouth daily.    [provider]  citalopram (CELEXA) 20 MG tablet  06/21/14   [provider]  latanoprost (XALATAN) 0.005 % ophthalmic solution 1 drop. 06/10/14   [provider]  lidocaine (LIDODERM) 5 % Place 1 patch onto the skin daily. Remove & Discard patch within 12 hours or as directed by MD 10/15/17   Tacy Learn, PA-C  losartan (COZAAR) 100 MG tablet Take 100 mg by mouth.    [provider]  Probiotic Product (ALIGN PO) Take 1 capsule by mouth daily.    [provider]  psyllium (METAMUCIL) 58.6 % powder Take 1 packet by mouth daily. 1 tsp mixed in water once a day    [provider]  simvastatin (ZOCOR) 20 MG tablet Take 20 mg by mouth.    [provider]  timolol (BETIMOL) 0.5 % ophthalmic solution Place 1 drop into the right eye 2 (two) times daily.    [provider]    Family History Family History  Problem Relation Age of Onset  . Hypertension Other   . Colon cancer Neg Hx   . Colon polyps Neg Hx   . Esophageal cancer Neg Hx   . Rectal cancer Neg Hx   . Stomach cancer Neg Hx  Social History Social History   Tobacco Use  . Smoking status: Never Smoker  . Smokeless tobacco: Never Used  Substance Use Topics  . Alcohol use: No  . Drug use: No     Allergies   Rofecoxib   Review of Systems Review of Systems  Constitutional: Positive for diaphoresis. Negative for chills and fever.  Eyes: Negative for visual disturbance.  Respiratory: Negative for shortness of breath.   Cardiovascular: Negative for chest pain.  Gastrointestinal: Positive for nausea and vomiting. Negative for abdominal pain.  Genitourinary: Negative for difficulty urinating.  Musculoskeletal: Positive for gait problem. Negative for neck pain and neck stiffness.  Skin: Negative for rash and wound.  Neurological: Positive for dizziness. Negative for speech difficulty, weakness and headaches.    Hematological: Negative for adenopathy. Does not bruise/bleed easily.  Psychiatric/Behavioral: Negative for confusion.  All other systems reviewed and are negative.    Physical Exam Updated Vital Signs BP (!) 148/100   Pulse 90   Temp 97.7 F (36.5 C) (Oral)   Resp (!) 22   Ht 5\' 8"  (1.727 m)   Wt 81.6 kg (180 lb)   SpO2 95%   BMI 27.37 kg/m   Physical Exam  Constitutional: He is oriented to person, place, and time. He appears well-developed and well-nourished. No distress.  HENT:  Head: Normocephalic and atraumatic.  Mouth/Throat: Oropharynx is clear and moist. No oropharyngeal exudate.  Eyes: Pupils are equal, round, and reactive to light. Conjunctivae and EOM are normal.  Neck: Normal range of motion.  Cardiovascular: Normal rate, regular rhythm, normal heart sounds and intact distal pulses.  No murmur heard. Pulmonary/Chest: Effort normal and breath sounds normal. No respiratory distress.  Abdominal: Soft. He exhibits no distension. There is no tenderness.  Musculoskeletal: He exhibits no tenderness or deformity.  Neurological: He is alert and oriented to person, place, and time. He has normal strength and normal reflexes. No cranial nerve deficit or sensory deficit. GCS eye subscore is 4. GCS verbal subscore is 5. GCS motor subscore is 6.  No pronator drift. Equal arm and leg strength.   Skin: Skin is warm and dry. He is not diaphoretic.  Psychiatric: He has a normal mood and affect. His behavior is normal.  Nursing note and vitals reviewed.    ED Treatments / Results  Labs (all labs ordered are listed, but only abnormal results are displayed) Labs Reviewed  BASIC METABOLIC PANEL - Abnormal; Notable for the following components:      Result Value   CO2 19 (*)    Glucose, Bld 120 (*)    All other components within normal limits  CBC - Abnormal; Notable for the following components:   Hemoglobin 12.7 (*)    All other components within normal limits  CBG  MONITORING, ED - Abnormal; Notable for the following components:   Glucose-Capillary 108 (*)    All other components within normal limits  TROPONIN I  URINALYSIS, ROUTINE W REFLEX MICROSCOPIC    EKG EKG Interpretation  Date/Time:  Monday October 15 2017 14:38:26 EDT Ventricular Rate:  77 PR Interval:    QRS Duration: 149 QT Interval:  464 QTC Calculation: 526 R Axis:   83 Text Interpretation:  Sinus rhythm Prolonged PR interval Right bundle branch block Confirmed by Gerlene Fee [phone]) on 10/15/2017 2:44:00 PM   Radiology Ct Head Wo Contrast  Result Date: 10/15/2017 CLINICAL DATA:  Dizziness, nausea and vomiting onset today. EXAM: CT HEAD WITHOUT CONTRAST TECHNIQUE: Contiguous axial images were obtained  from the base of the skull through the vertex without intravenous contrast. COMPARISON:  Head CT dated 06/12/2017.  Brain MRI dated 08/01/2017. FINDINGS: Brain: Ventricles are stable in size and configuration. Chronic small vessel ischemic change again noted within the bilateral periventricular and subcortical white matter regions. There is no mass, hemorrhage, edema or other evidence of acute parenchymal abnormality. No extra-axial hemorrhage. Vascular: No acute findings. Stable appearance compared to the head CT of 06/12/2017. Skull: Normal. Negative for fracture or focal lesion. Sinuses/Orbits: No acute finding. Other: None. IMPRESSION: 1. No acute findings. No intracranial mass, hemorrhage or edema. 2. Chronic small vessel ischemic changes in the white matter. Electronically Signed   By: Franki Cabot M.D.   On: 10/15/2017 17:24   Dg Chest Port 1 View  Result Date: 10/15/2017 CLINICAL DATA:  Dizziness, nausea, vomiting, hypertension, history prostate cancer EXAM: PORTABLE CHEST 1 VIEW COMPARISON:  Portable exam 1520 hours without priors for comparison FINDINGS: Enlargement of cardiac silhouette. Atherosclerotic calcification aorta. Mediastinal contours and pulmonary vascularity normal.  LEFT basilar pleural effusion and atelectasis versus infiltrate. Remaining lungs clear. No pneumothorax. Bones demineralized with thoracic spine degenerative changes and scoliosis. IMPRESSION: Mild enlargement of cardiac silhouette. LEFT pleural effusion and basilar atelectasis versus consolidation. Electronically Signed   By: Lavonia Dana M.D.   On: 10/15/2017 15:32    Procedures Procedures (including critical care time)  Medications Ordered in ED Medications  lidocaine (LIDODERM) 5 % 1 patch (has no administration in time range)  meclizine (ANTIVERT) tablet 50 mg (50 mg Oral Given 10/15/17 1618)  ondansetron (ZOFRAN) injection 4 mg (4 mg Intravenous Given 10/15/17 1618)     Initial Impression / Assessment and Plan / ED Course  I have reviewed the triage vital signs and the nursing notes.  Pertinent labs & imaging results that were available during my care of the patient were reviewed by me and considered in my medical decision making (see chart for details).  Clinical Course as of Oct 16 2046  Mon Oct 15, 2017  1658 Personally evaluated the patient after APP assessment.  Long-standing history of dizziness consistent with peripheral vertigo, MRI 2 months ago with nothing to suggest central cause.  Symptoms today are very positional, triggered by head movement.  No neurological deficits, no nystagmus.  Will attempt treatment for peripheral vertigo and reassess.   [MB]  4261 84 year old male brought in by EMS for report of feeling dizzy, room spinning, nausea and vomiting with sweats today.  Exam is unremarkable, on recheck patient states that he is feeling better, no further emesis.  Patient states that today for the first time ever he put rubbing alcohol and vinegar in his ear with his head tilted to the side, held it there for a few minutes and then quickly turned his head over and shook the drops out of his ear, dizziness onset after doing this. Patient with history of dizziness and peripheral  vertigo with MRI 2 months ago. Advised patient not to do this again, may had aggravated his vertigo. Patient will be ambulated by staff and dc if feeling well.    [LM]  2046 Patient is ambulatory without assistance, dizziness has completely resolved.  I advised against putting the drops in his urine shaking his head out again as this may have caused his vertigo episode today.  Patient reports pain in the back of his neck, no trauma or injury, lidocaine patch applied with prescription sent to pharmacy for him to pick out if this helps.  Otherwise follow-up with PCP for further evaluation and management, return to ER for worsening or concerning symptoms.   [LM]    Clinical Course User Index [LM] Tacy Learn, PA-C [MB] Maudie Flakes, MD    Final Clinical Impressions(s) / ED Diagnoses   Final diagnoses:  Dizziness    ED Discharge Orders        Ordered    lidocaine (LIDODERM) 5 %  Every 24 hours     10/15/17 2037       Tacy Learn, PA-C 10/15/17 2048    Maudie Flakes, MD 10/15/17 2326

## 2017-10-15 NOTE — ED Notes (Signed)
Patient verbalizes understanding of discharge instructions. Opportunity for questioning and answers were provided. Armband removed by staff, pt discharged from ED in wheelchair.  

## 2017-10-15 NOTE — ED Notes (Signed)
This RN spoke with [facility] nursing supervisor, Randall Hiss, and informed him pt was at ED and will be d/c back to [facility]

## 2017-10-15 NOTE — ED Notes (Signed)
Per MD no swallow screen indicated at this time

## 2017-10-17 DIAGNOSIS — R279 Unspecified lack of coordination: Secondary | ICD-10-CM | POA: Diagnosis not present

## 2017-10-17 DIAGNOSIS — I10 Essential (primary) hypertension: Secondary | ICD-10-CM | POA: Diagnosis not present

## 2017-10-17 DIAGNOSIS — Z6823 Body mass index (BMI) 23.0-23.9, adult: Secondary | ICD-10-CM | POA: Diagnosis not present

## 2017-10-17 DIAGNOSIS — R42 Dizziness and giddiness: Secondary | ICD-10-CM | POA: Diagnosis not present

## 2017-11-05 DIAGNOSIS — H8110 Benign paroxysmal vertigo, unspecified ear: Secondary | ICD-10-CM | POA: Diagnosis not present

## 2017-11-05 DIAGNOSIS — R260 Ataxic gait: Secondary | ICD-10-CM | POA: Diagnosis not present

## 2017-11-07 DIAGNOSIS — H25011 Cortical age-related cataract, right eye: Secondary | ICD-10-CM | POA: Diagnosis not present

## 2017-11-07 DIAGNOSIS — H2511 Age-related nuclear cataract, right eye: Secondary | ICD-10-CM | POA: Diagnosis not present

## 2017-11-13 DIAGNOSIS — H25811 Combined forms of age-related cataract, right eye: Secondary | ICD-10-CM | POA: Diagnosis not present

## 2017-11-13 DIAGNOSIS — H2511 Age-related nuclear cataract, right eye: Secondary | ICD-10-CM | POA: Diagnosis not present

## 2017-11-20 DIAGNOSIS — M47812 Spondylosis without myelopathy or radiculopathy, cervical region: Secondary | ICD-10-CM | POA: Diagnosis not present

## 2017-12-10 DIAGNOSIS — H538 Other visual disturbances: Secondary | ICD-10-CM | POA: Diagnosis not present

## 2017-12-10 DIAGNOSIS — I708 Atherosclerosis of other arteries: Secondary | ICD-10-CM | POA: Diagnosis not present

## 2017-12-10 DIAGNOSIS — R42 Dizziness and giddiness: Secondary | ICD-10-CM | POA: Diagnosis not present

## 2017-12-10 DIAGNOSIS — I10 Essential (primary) hypertension: Secondary | ICD-10-CM | POA: Diagnosis not present

## 2017-12-10 DIAGNOSIS — Z23 Encounter for immunization: Secondary | ICD-10-CM | POA: Diagnosis not present

## 2017-12-10 DIAGNOSIS — E7849 Other hyperlipidemia: Secondary | ICD-10-CM | POA: Diagnosis not present

## 2017-12-10 DIAGNOSIS — M25561 Pain in right knee: Secondary | ICD-10-CM | POA: Diagnosis not present

## 2017-12-10 DIAGNOSIS — M5416 Radiculopathy, lumbar region: Secondary | ICD-10-CM | POA: Diagnosis not present

## 2017-12-10 DIAGNOSIS — M461 Sacroiliitis, not elsewhere classified: Secondary | ICD-10-CM | POA: Diagnosis not present

## 2017-12-10 DIAGNOSIS — M1990 Unspecified osteoarthritis, unspecified site: Secondary | ICD-10-CM | POA: Diagnosis not present

## 2017-12-10 DIAGNOSIS — Z6823 Body mass index (BMI) 23.0-23.9, adult: Secondary | ICD-10-CM | POA: Diagnosis not present

## 2017-12-10 DIAGNOSIS — R279 Unspecified lack of coordination: Secondary | ICD-10-CM | POA: Diagnosis not present

## 2017-12-17 DIAGNOSIS — S0501XA Injury of conjunctiva and corneal abrasion without foreign body, right eye, initial encounter: Secondary | ICD-10-CM | POA: Diagnosis not present

## 2017-12-19 DIAGNOSIS — S0501XA Injury of conjunctiva and corneal abrasion without foreign body, right eye, initial encounter: Secondary | ICD-10-CM | POA: Diagnosis not present

## 2017-12-19 DIAGNOSIS — B0052 Herpesviral keratitis: Secondary | ICD-10-CM | POA: Diagnosis not present

## 2017-12-24 DIAGNOSIS — S0501XA Injury of conjunctiva and corneal abrasion without foreign body, right eye, initial encounter: Secondary | ICD-10-CM | POA: Diagnosis not present

## 2017-12-31 DIAGNOSIS — S0501XA Injury of conjunctiva and corneal abrasion without foreign body, right eye, initial encounter: Secondary | ICD-10-CM | POA: Diagnosis not present

## 2017-12-31 DIAGNOSIS — H179 Unspecified corneal scar and opacity: Secondary | ICD-10-CM | POA: Diagnosis not present

## 2017-12-31 DIAGNOSIS — B0233 Zoster keratitis: Secondary | ICD-10-CM | POA: Diagnosis not present

## 2018-01-03 DIAGNOSIS — Z6823 Body mass index (BMI) 23.0-23.9, adult: Secondary | ICD-10-CM | POA: Diagnosis not present

## 2018-01-03 DIAGNOSIS — B0230 Zoster ocular disease, unspecified: Secondary | ICD-10-CM | POA: Diagnosis not present

## 2018-01-09 DIAGNOSIS — H179 Unspecified corneal scar and opacity: Secondary | ICD-10-CM | POA: Diagnosis not present

## 2018-01-09 DIAGNOSIS — B0233 Zoster keratitis: Secondary | ICD-10-CM | POA: Diagnosis not present

## 2018-01-09 DIAGNOSIS — S0501XA Injury of conjunctiva and corneal abrasion without foreign body, right eye, initial encounter: Secondary | ICD-10-CM | POA: Diagnosis not present

## 2018-02-18 DIAGNOSIS — R82998 Other abnormal findings in urine: Secondary | ICD-10-CM | POA: Diagnosis not present

## 2018-02-18 DIAGNOSIS — E7849 Other hyperlipidemia: Secondary | ICD-10-CM | POA: Diagnosis not present

## 2018-02-18 DIAGNOSIS — I10 Essential (primary) hypertension: Secondary | ICD-10-CM | POA: Diagnosis not present

## 2018-02-18 DIAGNOSIS — Z125 Encounter for screening for malignant neoplasm of prostate: Secondary | ICD-10-CM | POA: Diagnosis not present

## 2018-02-18 LAB — PSA: PSA: 0.007

## 2018-02-20 DIAGNOSIS — H40012 Open angle with borderline findings, low risk, left eye: Secondary | ICD-10-CM | POA: Diagnosis not present

## 2018-02-20 DIAGNOSIS — H401111 Primary open-angle glaucoma, right eye, mild stage: Secondary | ICD-10-CM | POA: Diagnosis not present

## 2018-02-20 DIAGNOSIS — H179 Unspecified corneal scar and opacity: Secondary | ICD-10-CM | POA: Diagnosis not present

## 2018-02-25 DIAGNOSIS — H538 Other visual disturbances: Secondary | ICD-10-CM | POA: Diagnosis not present

## 2018-02-25 DIAGNOSIS — I10 Essential (primary) hypertension: Secondary | ICD-10-CM | POA: Diagnosis not present

## 2018-02-25 DIAGNOSIS — Z6823 Body mass index (BMI) 23.0-23.9, adult: Secondary | ICD-10-CM | POA: Diagnosis not present

## 2018-02-25 DIAGNOSIS — Z1389 Encounter for screening for other disorder: Secondary | ICD-10-CM | POA: Diagnosis not present

## 2018-02-25 DIAGNOSIS — R42 Dizziness and giddiness: Secondary | ICD-10-CM | POA: Diagnosis not present

## 2018-02-25 DIAGNOSIS — M5416 Radiculopathy, lumbar region: Secondary | ICD-10-CM | POA: Diagnosis not present

## 2018-02-25 DIAGNOSIS — Z0000 Encounter for general adult medical examination without abnormal findings: Secondary | ICD-10-CM | POA: Diagnosis not present

## 2018-02-25 DIAGNOSIS — M25561 Pain in right knee: Secondary | ICD-10-CM | POA: Diagnosis not present

## 2018-02-25 DIAGNOSIS — I708 Atherosclerosis of other arteries: Secondary | ICD-10-CM | POA: Diagnosis not present

## 2018-02-25 DIAGNOSIS — M461 Sacroiliitis, not elsewhere classified: Secondary | ICD-10-CM | POA: Diagnosis not present

## 2018-02-25 DIAGNOSIS — M1990 Unspecified osteoarthritis, unspecified site: Secondary | ICD-10-CM | POA: Diagnosis not present

## 2018-02-25 DIAGNOSIS — E7849 Other hyperlipidemia: Secondary | ICD-10-CM | POA: Diagnosis not present

## 2018-03-06 DIAGNOSIS — M47812 Spondylosis without myelopathy or radiculopathy, cervical region: Secondary | ICD-10-CM | POA: Diagnosis not present

## 2018-03-06 DIAGNOSIS — M47816 Spondylosis without myelopathy or radiculopathy, lumbar region: Secondary | ICD-10-CM | POA: Diagnosis not present

## 2018-03-27 DIAGNOSIS — H401111 Primary open-angle glaucoma, right eye, mild stage: Secondary | ICD-10-CM | POA: Diagnosis not present

## 2018-03-27 DIAGNOSIS — H02839 Dermatochalasis of unspecified eye, unspecified eyelid: Secondary | ICD-10-CM | POA: Diagnosis not present

## 2018-03-27 DIAGNOSIS — H179 Unspecified corneal scar and opacity: Secondary | ICD-10-CM | POA: Diagnosis not present

## 2018-03-27 DIAGNOSIS — H40012 Open angle with borderline findings, low risk, left eye: Secondary | ICD-10-CM | POA: Diagnosis not present

## 2018-04-05 DIAGNOSIS — I4510 Unspecified right bundle-branch block: Secondary | ICD-10-CM | POA: Diagnosis not present

## 2018-04-05 DIAGNOSIS — I6381 Other cerebral infarction due to occlusion or stenosis of small artery: Secondary | ICD-10-CM | POA: Diagnosis not present

## 2018-04-05 DIAGNOSIS — R531 Weakness: Secondary | ICD-10-CM | POA: Diagnosis not present

## 2018-04-06 ENCOUNTER — Emergency Department (HOSPITAL_COMMUNITY): Payer: Medicare Other

## 2018-04-06 ENCOUNTER — Inpatient Hospital Stay (HOSPITAL_COMMUNITY)
Admission: EM | Admit: 2018-04-06 | Discharge: 2018-04-10 | DRG: 066 | Disposition: A | Discharge status: Rehabilitation Facility | Payer: Medicare Other | Source: Skilled Nursing Facility | Attending: Internal Medicine | Admitting: Internal Medicine

## 2018-04-06 ENCOUNTER — Other Ambulatory Visit: Payer: Self-pay

## 2018-04-06 ENCOUNTER — Encounter (HOSPITAL_COMMUNITY): Payer: Self-pay

## 2018-04-06 DIAGNOSIS — I6381 Other cerebral infarction due to occlusion or stenosis of small artery: Secondary | ICD-10-CM

## 2018-04-06 DIAGNOSIS — R269 Unspecified abnormalities of gait and mobility: Secondary | ICD-10-CM | POA: Diagnosis not present

## 2018-04-06 DIAGNOSIS — K225 Diverticulum of esophagus, acquired: Secondary | ICD-10-CM | POA: Diagnosis present

## 2018-04-06 DIAGNOSIS — I69398 Other sequelae of cerebral infarction: Secondary | ICD-10-CM | POA: Diagnosis not present

## 2018-04-06 DIAGNOSIS — Z974 Presence of external hearing-aid: Secondary | ICD-10-CM | POA: Diagnosis not present

## 2018-04-06 DIAGNOSIS — G8311 Monoplegia of lower limb affecting right dominant side: Secondary | ICD-10-CM | POA: Diagnosis present

## 2018-04-06 DIAGNOSIS — Z538 Procedure and treatment not carried out for other reasons: Secondary | ICD-10-CM | POA: Diagnosis present

## 2018-04-06 DIAGNOSIS — Z7982 Long term (current) use of aspirin: Secondary | ICD-10-CM

## 2018-04-06 DIAGNOSIS — E8809 Other disorders of plasma-protein metabolism, not elsewhere classified: Secondary | ICD-10-CM | POA: Diagnosis present

## 2018-04-06 DIAGNOSIS — Z8249 Family history of ischemic heart disease and other diseases of the circulatory system: Secondary | ICD-10-CM

## 2018-04-06 DIAGNOSIS — Z7401 Bed confinement status: Secondary | ICD-10-CM

## 2018-04-06 DIAGNOSIS — Z79899 Other long term (current) drug therapy: Secondary | ICD-10-CM

## 2018-04-06 DIAGNOSIS — M5030 Other cervical disc degeneration, unspecified cervical region: Secondary | ICD-10-CM | POA: Diagnosis present

## 2018-04-06 DIAGNOSIS — I639 Cerebral infarction, unspecified: Secondary | ICD-10-CM | POA: Diagnosis not present

## 2018-04-06 DIAGNOSIS — R0902 Hypoxemia: Secondary | ICD-10-CM | POA: Diagnosis not present

## 2018-04-06 DIAGNOSIS — Z888 Allergy status to other drugs, medicaments and biological substances status: Secondary | ICD-10-CM

## 2018-04-06 DIAGNOSIS — Z9079 Acquired absence of other genital organ(s): Secondary | ICD-10-CM

## 2018-04-06 DIAGNOSIS — S0990XA Unspecified injury of head, initial encounter: Secondary | ICD-10-CM | POA: Diagnosis not present

## 2018-04-06 DIAGNOSIS — G479 Sleep disorder, unspecified: Secondary | ICD-10-CM | POA: Diagnosis present

## 2018-04-06 DIAGNOSIS — R0682 Tachypnea, not elsewhere classified: Secondary | ICD-10-CM

## 2018-04-06 DIAGNOSIS — S299XXA Unspecified injury of thorax, initial encounter: Secondary | ICD-10-CM | POA: Diagnosis not present

## 2018-04-06 DIAGNOSIS — S199XXA Unspecified injury of neck, initial encounter: Secondary | ICD-10-CM | POA: Diagnosis not present

## 2018-04-06 DIAGNOSIS — F5105 Insomnia due to other mental disorder: Secondary | ICD-10-CM | POA: Diagnosis present

## 2018-04-06 DIAGNOSIS — I5189 Other ill-defined heart diseases: Secondary | ICD-10-CM

## 2018-04-06 DIAGNOSIS — S79911A Unspecified injury of right hip, initial encounter: Secondary | ICD-10-CM | POA: Diagnosis not present

## 2018-04-06 DIAGNOSIS — W19XXXA Unspecified fall, initial encounter: Secondary | ICD-10-CM | POA: Diagnosis not present

## 2018-04-06 DIAGNOSIS — I6389 Other cerebral infarction: Secondary | ICD-10-CM | POA: Diagnosis not present

## 2018-04-06 DIAGNOSIS — H9190 Unspecified hearing loss, unspecified ear: Secondary | ICD-10-CM | POA: Diagnosis not present

## 2018-04-06 DIAGNOSIS — Z66 Do not resuscitate: Secondary | ICD-10-CM | POA: Diagnosis present

## 2018-04-06 DIAGNOSIS — E46 Unspecified protein-calorie malnutrition: Secondary | ICD-10-CM | POA: Diagnosis not present

## 2018-04-06 DIAGNOSIS — Z8546 Personal history of malignant neoplasm of prostate: Secondary | ICD-10-CM | POA: Diagnosis not present

## 2018-04-06 DIAGNOSIS — M79609 Pain in unspecified limb: Secondary | ICD-10-CM | POA: Diagnosis not present

## 2018-04-06 DIAGNOSIS — H409 Unspecified glaucoma: Secondary | ICD-10-CM | POA: Diagnosis not present

## 2018-04-06 DIAGNOSIS — E7800 Pure hypercholesterolemia, unspecified: Secondary | ICD-10-CM | POA: Diagnosis not present

## 2018-04-06 DIAGNOSIS — R32 Unspecified urinary incontinence: Secondary | ICD-10-CM | POA: Diagnosis present

## 2018-04-06 DIAGNOSIS — M5136 Other intervertebral disc degeneration, lumbar region: Secondary | ICD-10-CM | POA: Diagnosis present

## 2018-04-06 DIAGNOSIS — R29702 NIHSS score 2: Secondary | ICD-10-CM | POA: Diagnosis present

## 2018-04-06 DIAGNOSIS — R531 Weakness: Secondary | ICD-10-CM | POA: Diagnosis not present

## 2018-04-06 DIAGNOSIS — F411 Generalized anxiety disorder: Secondary | ICD-10-CM | POA: Diagnosis present

## 2018-04-06 DIAGNOSIS — M542 Cervicalgia: Secondary | ICD-10-CM | POA: Diagnosis not present

## 2018-04-06 DIAGNOSIS — I6789 Other cerebrovascular disease: Secondary | ICD-10-CM | POA: Diagnosis not present

## 2018-04-06 DIAGNOSIS — G3184 Mild cognitive impairment, so stated: Secondary | ICD-10-CM | POA: Diagnosis present

## 2018-04-06 DIAGNOSIS — R296 Repeated falls: Secondary | ICD-10-CM | POA: Diagnosis present

## 2018-04-06 DIAGNOSIS — E785 Hyperlipidemia, unspecified: Secondary | ICD-10-CM | POA: Diagnosis present

## 2018-04-06 DIAGNOSIS — M419 Scoliosis, unspecified: Secondary | ICD-10-CM | POA: Diagnosis present

## 2018-04-06 DIAGNOSIS — G4700 Insomnia, unspecified: Secondary | ICD-10-CM | POA: Diagnosis present

## 2018-04-06 DIAGNOSIS — Z886 Allergy status to analgesic agent status: Secondary | ICD-10-CM

## 2018-04-06 DIAGNOSIS — I69341 Monoplegia of lower limb following cerebral infarction affecting right dominant side: Secondary | ICD-10-CM | POA: Diagnosis not present

## 2018-04-06 DIAGNOSIS — S3992XA Unspecified injury of lower back, initial encounter: Secondary | ICD-10-CM | POA: Diagnosis not present

## 2018-04-06 DIAGNOSIS — R079 Chest pain, unspecified: Secondary | ICD-10-CM | POA: Diagnosis not present

## 2018-04-06 DIAGNOSIS — R350 Frequency of micturition: Secondary | ICD-10-CM | POA: Diagnosis not present

## 2018-04-06 DIAGNOSIS — R42 Dizziness and giddiness: Secondary | ICD-10-CM | POA: Diagnosis not present

## 2018-04-06 DIAGNOSIS — R195 Other fecal abnormalities: Secondary | ICD-10-CM | POA: Diagnosis not present

## 2018-04-06 DIAGNOSIS — I10 Essential (primary) hypertension: Secondary | ICD-10-CM | POA: Diagnosis not present

## 2018-04-06 DIAGNOSIS — R000 Tachycardia, unspecified: Secondary | ICD-10-CM

## 2018-04-06 DIAGNOSIS — R252 Cramp and spasm: Secondary | ICD-10-CM | POA: Diagnosis not present

## 2018-04-06 DIAGNOSIS — M545 Low back pain: Secondary | ICD-10-CM | POA: Diagnosis not present

## 2018-04-06 DIAGNOSIS — I69318 Other symptoms and signs involving cognitive functions following cerebral infarction: Secondary | ICD-10-CM | POA: Diagnosis not present

## 2018-04-06 DIAGNOSIS — M79604 Pain in right leg: Secondary | ICD-10-CM | POA: Diagnosis not present

## 2018-04-06 DIAGNOSIS — I4510 Unspecified right bundle-branch block: Secondary | ICD-10-CM | POA: Diagnosis not present

## 2018-04-06 LAB — CBC
HCT: 47.3 % (ref 39.0–52.0)
Hemoglobin: 15.3 g/dL (ref 13.0–17.0)
MCH: 27.8 pg (ref 26.0–34.0)
MCHC: 32.3 g/dL (ref 30.0–36.0)
MCV: 85.8 fL (ref 80.0–100.0)
Platelets: 185 10*3/uL (ref 150–400)
RBC: 5.51 MIL/uL (ref 4.22–5.81)
RDW: 12.6 % (ref 11.5–15.5)
WBC: 7.8 10*3/uL (ref 4.0–10.5)
nRBC: 0 % (ref 0.0–0.2)

## 2018-04-06 LAB — CBC WITH DIFFERENTIAL/PLATELET
ABS IMMATURE GRANULOCYTES: 0.04 10*3/uL (ref 0.00–0.07)
BASOS PCT: 1 %
Basophils Absolute: 0.1 10*3/uL (ref 0.0–0.1)
Eosinophils Absolute: 0.5 10*3/uL (ref 0.0–0.5)
Eosinophils Relative: 7 %
HCT: 45.4 % (ref 39.0–52.0)
HEMOGLOBIN: 14.4 g/dL (ref 13.0–17.0)
Immature Granulocytes: 1 %
Lymphocytes Relative: 18 %
Lymphs Abs: 1.2 10*3/uL (ref 0.7–4.0)
MCH: 28.6 pg (ref 26.0–34.0)
MCHC: 31.7 g/dL (ref 30.0–36.0)
MCV: 90.3 fL (ref 80.0–100.0)
Monocytes Absolute: 0.8 10*3/uL (ref 0.1–1.0)
Monocytes Relative: 12 %
NEUTROS ABS: 4.2 10*3/uL (ref 1.7–7.7)
Neutrophils Relative %: 61 %
Platelets: 193 10*3/uL (ref 150–400)
RBC: 5.03 MIL/uL (ref 4.22–5.81)
RDW: 13 % (ref 11.5–15.5)
WBC: 6.8 10*3/uL (ref 4.0–10.5)
nRBC: 0 % (ref 0.0–0.2)

## 2018-04-06 LAB — I-STAT TROPONIN, ED: Troponin i, poc: 0.01 ng/mL (ref 0.00–0.08)

## 2018-04-06 LAB — COMPREHENSIVE METABOLIC PANEL
ALT: 22 U/L (ref 0–44)
AST: 22 U/L (ref 15–41)
Albumin: 4.1 g/dL (ref 3.5–5.0)
Alkaline Phosphatase: 53 U/L (ref 38–126)
Anion gap: 9 (ref 5–15)
BUN: 19 mg/dL (ref 8–23)
CO2: 27 mmol/L (ref 22–32)
Calcium: 10.1 mg/dL (ref 8.9–10.3)
Chloride: 106 mmol/L (ref 98–111)
Creatinine, Ser: 1.05 mg/dL (ref 0.61–1.24)
GFR calc Af Amer: >60 mL/min (ref >60)
GFR calc non Af Amer: >60 mL/min (ref >60)
GLUCOSE: 97 mg/dL (ref 70–99)
Potassium: 3.9 mmol/L (ref 3.5–5.1)
Sodium: 142 mmol/L (ref 135–145)
Total Bilirubin: 1.2 mg/dL (ref 0.3–1.2)
Total Protein: 6.7 g/dL (ref 6.5–8.1)

## 2018-04-06 LAB — URINALYSIS, ROUTINE W REFLEX MICROSCOPIC
Bilirubin Urine: NEGATIVE
Glucose, UA: NEGATIVE mg/dL
Hgb urine dipstick: NEGATIVE
Ketones, ur: NEGATIVE mg/dL
Leukocytes, UA: NEGATIVE
Nitrite: NEGATIVE
PH: 7 (ref 5.0–8.0)
Protein, ur: NEGATIVE mg/dL
SPECIFIC GRAVITY, URINE: 1.005 (ref 1.005–1.030)

## 2018-04-06 LAB — CREATININE, SERUM
Creatinine, Ser: 1.01 mg/dL (ref 0.61–1.24)
GFR calc Af Amer: >60 mL/min (ref >60)

## 2018-04-06 LAB — HEMOGLOBIN A1C
Hgb A1c MFr Bld: 5.5 % (ref 4.8–5.6)
Mean Plasma Glucose: 111.15 mg/dL

## 2018-04-06 LAB — TROPONIN I: Troponin I: <0.03 ng/mL (ref <0.03)

## 2018-04-06 LAB — MRSA PCR SCREENING: MRSA by PCR: NEGATIVE

## 2018-04-06 LAB — TSH: TSH: 1.649 u[IU]/mL (ref 0.350–4.500)

## 2018-04-06 MED ORDER — ONDANSETRON HCL 4 MG PO TABS: 4.0000 mg | ORAL_TABLET | Freq: Four times a day (QID) | ORAL | Status: DC | PRN

## 2018-04-06 MED ORDER — LORAZEPAM 2 MG/ML IJ SOLN
0.5000 mg | Freq: Once | INTRAMUSCULAR | Status: AC
  Administered 2018-04-06: 0.5 mg via INTRAVENOUS
  Filled 2018-04-06: qty 1

## 2018-04-06 MED ORDER — ASPIRIN 325 MG PO TABS
325.0000 mg | ORAL_TABLET | Freq: Every day | ORAL | Status: DC
  Administered 2018-04-06 – 2018-04-07 (×2): 325 mg via ORAL
  Filled 2018-04-06 (×2): qty 1

## 2018-04-06 MED ORDER — CLOPIDOGREL BISULFATE 300 MG PO TABS: 300.0000 mg | ORAL_TABLET | Freq: Once | ORAL | Status: DC

## 2018-04-06 MED ORDER — LATANOPROST 0.005 % OP SOLN
1.0000 [drp] | Freq: Every day | OPHTHALMIC | Status: DC
  Administered 2018-04-06 – 2018-04-09 (×4): 1 [drp] via OPHTHALMIC
  Filled 2018-04-06: qty 2.5

## 2018-04-06 MED ORDER — CITALOPRAM HYDROBROMIDE 10 MG PO TABS
20.0000 mg | ORAL_TABLET | Freq: Every day | ORAL | Status: DC
  Administered 2018-04-07 – 2018-04-10 (×4): 20 mg via ORAL
  Filled 2018-04-06 (×5): qty 2

## 2018-04-06 MED ORDER — SODIUM CHLORIDE 0.9% FLUSH: 3.0000 mL | INTRAVENOUS | Status: DC | PRN

## 2018-04-06 MED ORDER — SIMVASTATIN 20 MG PO TABS
40.0000 mg | ORAL_TABLET | Freq: Every day | ORAL | Status: DC
  Administered 2018-04-06: 40 mg via ORAL
  Filled 2018-04-06: qty 2

## 2018-04-06 MED ORDER — DOCUSATE SODIUM 100 MG PO CAPS
100.0000 mg | ORAL_CAPSULE | Freq: Two times a day (BID) | ORAL | Status: DC
  Administered 2018-04-06 – 2018-04-09 (×6): 100 mg via ORAL
  Filled 2018-04-06 (×7): qty 1

## 2018-04-06 MED ORDER — TIMOLOL HEMIHYDRATE 0.5 % OP SOLN: 1.0000 [drp] | Freq: Two times a day (BID) | OPHTHALMIC | Status: DC

## 2018-04-06 MED ORDER — ALPRAZOLAM 0.25 MG PO TABS
0.2500 mg | ORAL_TABLET | Freq: Two times a day (BID) | ORAL | Status: DC | PRN
  Administered 2018-04-06: 0.25 mg via ORAL
  Filled 2018-04-06: qty 1

## 2018-04-06 MED ORDER — ACETAMINOPHEN 650 MG RE SUPP: 650.0000 mg | Freq: Four times a day (QID) | RECTAL | Status: DC | PRN

## 2018-04-06 MED ORDER — SODIUM CHLORIDE 0.9 % IV SOLN: 250.0000 mL | INTRAVENOUS | Status: DC | PRN

## 2018-04-06 MED ORDER — AMLODIPINE BESYLATE 5 MG PO TABS
5.0000 mg | ORAL_TABLET | Freq: Every day | ORAL | Status: DC
  Administered 2018-04-06 – 2018-04-07 (×2): 5 mg via ORAL
  Filled 2018-04-06 (×2): qty 1

## 2018-04-06 MED ORDER — POLYETHYLENE GLYCOL 3350 17 G PO PACK: 17.0000 g | PACK | Freq: Every day | ORAL | Status: DC | PRN

## 2018-04-06 MED ORDER — ONDANSETRON HCL 4 MG/2ML IJ SOLN: 4.0000 mg | Freq: Four times a day (QID) | INTRAMUSCULAR | Status: DC | PRN

## 2018-04-06 MED ORDER — CLOPIDOGREL BISULFATE 75 MG PO TABS
75.0000 mg | ORAL_TABLET | Freq: Every day | ORAL | Status: DC
  Administered 2018-04-06 – 2018-04-10 (×5): 75 mg via ORAL
  Filled 2018-04-06 (×5): qty 1

## 2018-04-06 MED ORDER — SODIUM CHLORIDE 0.9% FLUSH
3.0000 mL | Freq: Two times a day (BID) | INTRAVENOUS | Status: DC
  Administered 2018-04-06 – 2018-04-09 (×5): 3 mL via INTRAVENOUS

## 2018-04-06 MED ORDER — LOSARTAN POTASSIUM 50 MG PO TABS
100.0000 mg | ORAL_TABLET | Freq: Every day | ORAL | Status: DC
  Administered 2018-04-07: 100 mg via ORAL
  Filled 2018-04-06 (×2): qty 2

## 2018-04-06 MED ORDER — ACETAMINOPHEN 325 MG PO TABS: 650.0000 mg | ORAL_TABLET | Freq: Four times a day (QID) | ORAL | Status: DC | PRN

## 2018-04-06 MED ORDER — ASPIRIN EC 81 MG PO TBEC: 81.0000 mg | DELAYED_RELEASE_TABLET | Freq: Every day | ORAL | Status: DC

## 2018-04-06 MED ORDER — ENOXAPARIN SODIUM 40 MG/0.4ML ~~LOC~~ SOLN
40.0000 mg | SUBCUTANEOUS | Status: DC
  Administered 2018-04-06 – 2018-04-07 (×2): 40 mg via SUBCUTANEOUS
  Filled 2018-04-06 (×2): qty 0.4

## 2018-04-06 MED ORDER — LIDOCAINE 5 % EX PTCH: 1.0000 | MEDICATED_PATCH | CUTANEOUS | Status: DC

## 2018-04-06 NOTE — ED Notes (Signed)
Patient transported to MRI 

## 2018-04-06 NOTE — ED Triage Notes (Signed)
Per EMS, patient coming from [place] with complaints of two witnessed falls.   First fall was yesterday afternoon. He was bending over to pick something up from the floor and fell. Did not get evaluated after this incident.   This morning he was going to use the urinal and had some pain/weakness in the right leg which led to a fall. Patient is currently complaining of pain in the right leg but was able to ambulate to the stretcher with EMS.   No obvious injuries noted. Patient denies hitting head and LOC. Patient is not currently on blood thinners.

## 2018-04-06 NOTE — ED Notes (Signed)
Bed: [facility] Expected date:  Expected time:  Means of arrival:  Comments: Hold for hall c

## 2018-04-06 NOTE — ED Notes (Signed)
Bed: [facility] Expected date:  Expected time:  Means of arrival:  Comments: 

## 2018-04-06 NOTE — Consult Note (Signed)
Neurology Consultation  Reason for Consult: Stroke   Referring Physician: Dr. Louanne Belton  CC: Right leg weakness and Fall    History is obtained from: Chart review   HPI: [name] Duffy is a 85 y.o. male with a PMHx of HTN, HLD and prostate CA who presented to [facility] today with right leg weakness. Apparently he went to bed fine, but when he woke up to go to the bathroom he noticed weakness of his right leg.Marland Kitchen He resides in an independent living home so he pressed his button to notify staff who responded and called EMS.  A CT brain was at [facility] was negative for hemorrhage. MRI revealed an acute small vessel white matter infarct in the left centrum semiovale near the left perirolandic cortex. It was determined that he would benefit from transfer and further work up at [employer] [facility].    LKW:  tpa given?: no, no outside of the window  Premorbid modified Rankin scale (mRS): 3 0-Completely asymptomatic and back to baseline post-stroke 1-No significant post stroke disability and can perform usual duties with stroke symptoms 2-Slight disability-UNABLE to perform all activities but does not need assistance  3-Moderate disability-requires help but walks WITHOUT assistance 4-Needs assistance to walk and tend to bodily needs 5-Severe disability-bedridden, incontinent, needs constant attention 6- Death   ROS: A 14 point ROS was performed and is negative except as noted in the HPI.  Past Medical History:  Diagnosis Date  . Anxiety   . Arthritis    hands  . Blood transfusion without reported diagnosis    pt thinks had blood trnsfusion at prostate ca surgery   . Glaucoma   . High cholesterol   . Hypertension   . Prostate cancer ([place])     3-Moderate disability-requires help but walks WITHOUT assistance HTN, HLD,   Family History  Problem Relation Age of Onset  . Hypertension Other   . Colon cancer Neg Hx   . Colon polyps Neg Hx   . Esophageal cancer Neg Hx   . Rectal cancer Neg Hx   .  Stomach cancer Neg Hx      Social History:   reports that he has never smoked. He has never used smokeless tobacco. He reports that he does not drink alcohol or use drugs.  Medications  Current Facility-Administered Medications:  .  0.9 %  sodium chloride infusion, 250 mL, Intravenous, PRN, Pokhrel, Laxman, MD .  acetaminophen (TYLENOL) tablet 650 mg, 650 mg, Oral, Q6H PRN **OR** acetaminophen (TYLENOL) suppository 650 mg, 650 mg, Rectal, Q6H PRN, Pokhrel, Laxman, MD .  ALPRAZolam Duanne Moron) tablet 0.25 mg, 0.25 mg, Oral, BID PRN, Pokhrel, Laxman, MD .  Derrill Memo ON 04/07/2018] aspirin EC tablet 81 mg, 81 mg, Oral, Daily, Pokhrel, Laxman, MD .  aspirin tablet 325 mg, 325 mg, Oral, Daily, Pokhrel, Laxman, MD, 325 mg at 04/06/18 1551 .  [START ON 04/07/2018] citalopram (CELEXA) tablet 20 mg, 20 mg, Oral, Daily, Pokhrel, Laxman, MD .  docusate sodium (COLACE) capsule 100 mg, 100 mg, Oral, BID, Pokhrel, Laxman, MD .  enoxaparin (LOVENOX) injection 40 mg, 40 mg, Subcutaneous, Q24H, Pokhrel, Laxman, MD .  Derrill Memo ON 04/07/2018] losartan (COZAAR) tablet 100 mg, 100 mg, Oral, Daily, Pokhrel, Laxman, MD .  ondansetron (ZOFRAN) tablet 4 mg, 4 mg, Oral, Q6H PRN **OR** ondansetron (ZOFRAN) injection 4 mg, 4 mg, Intravenous, Q6H PRN, Pokhrel, Laxman, MD .  polyethylene glycol (MIRALAX / GLYCOLAX) packet 17 g, 17 g, Oral, Daily PRN, Pokhrel, Laxman, MD .  simvastatin (  ZOCOR) tablet 40 mg, 40 mg, Oral, q1800, Pokhrel, Laxman, MD .  sodium chloride flush (NS) 0.9 % injection 3 mL, 3 mL, Intravenous, Q12H, Pokhrel, Laxman, MD .  sodium chloride flush (NS) 0.9 % injection 3 mL, 3 mL, Intravenous, PRN, Pokhrel, Laxman, MD   Exam: Current vital signs: BP (!) 145/97 (BP Location: Left Arm)   Pulse 88   Temp 97.9 F (36.6 C) (Oral)   Resp 18   SpO2 98%  Vital signs in last 24 hours: Temp:  [97.9 F (36.6 C)-98.2 F (36.8 C)] 97.9 F (36.6 C) (01/18 1745) Pulse Rate:  [70-91] 88 (01/18 1745) Resp:  [14-24] 18  (01/18 1745) BP: (138-158)/(85-104) 145/97 (01/18 1745) SpO2:  [95 %-100 %] 98 % (01/18 1745)  GENERAL: Awake, alert in NAD HEENT: - Normocephalic and atraumatic, dry mm, no LN++, no Thyromegally LUNGS - Clear to auscultation bilaterally with no wheezes CV - S1S2 RRR, no m/r/g, equal pulses bilaterally. ABDOMEN - Soft, nontender, nondistended with normoactive BS Ext: warm, well perfused, intact peripheral pulses,0 edema  NEURO:  Mental Status: AA&Ox3  Language: speech is slightly dysarthric.  Naming, repetition, fluency, and comprehension intact. Cranial Nerves: PERRL 3 mm bilat. Evidence for recent cataract surgery. EOMI, visual fields full, no facial asymmetry, facial sensation intact, hearing intact, tongue/uvula/soft palate midline, normal sternocleidomastoid and trapezius muscle strength. No evidence of tongue atrophy or fasciculations  Motor: Right parietal drift and positive orbiting fingers test on the right. 5/5 LUE, LLE and RUE. RLE 4/5 Tone and bulk are normal              Sensation- Intact to light touch bilaterally with sensory impairment left sensed as cooler than right  Coordination: FTN intact bilaterally, no ataxia in BLE. Gait- deferred  NIHSS 2   Labs I have reviewed labs in epic and the results pertinent to this consultation are: No overall concerns with labs see below- unremarkable   CBC    Component Value Date/Time   WBC 6.8 04/06/2018 0840   RBC 5.03 04/06/2018 0840   HGB 14.4 04/06/2018 0840   HCT 45.4 04/06/2018 0840   PLT 193 04/06/2018 0840   MCV 90.3 04/06/2018 0840   MCH 28.6 04/06/2018 0840   MCHC 31.7 04/06/2018 0840   RDW 13.0 04/06/2018 0840   LYMPHSABS 1.2 04/06/2018 0840   MONOABS 0.8 04/06/2018 0840   EOSABS 0.5 04/06/2018 0840   BASOSABS 0.1 04/06/2018 0840    CMP     Component Value Date/Time   NA 142 04/06/2018 0840   K 3.9 04/06/2018 0840   CL 106 04/06/2018 0840   CO2 27 04/06/2018 0840   GLUCOSE 97 04/06/2018 0840   BUN  19 04/06/2018 0840   CREATININE 1.05 04/06/2018 0840   CALCIUM 10.1 04/06/2018 0840   PROT 6.7 04/06/2018 0840   ALBUMIN 4.1 04/06/2018 0840   AST 22 04/06/2018 0840   ALT 22 04/06/2018 0840   ALKPHOS 53 04/06/2018 0840   BILITOT 1.2 04/06/2018 0840   GFRNONAA >60 04/06/2018 0840   GFRAA >60 04/06/2018 0840    Lipid Panel  No results found for: CHOL, TRIG, HDL, CHOLHDL, VLDL, LDLCALC, LDLDIRECT   Imaging I have reviewed the images obtained:  CT-scan of the brain and cervical spine 1. No acute intracranial abnormality 2. Negative for cervical spine fracture.  Multilevel spondylosis. 3. Fusiform enlargement distal right vertebral artery due to atherosclerotic disease unchanged from prior studies.    MRI examination of the brain IMPRESSION: 1. Acute  small vessel white matter infarct in the left centrum semiovale near the left perirolandic cortex. No hemorrhage or mass effect. 2. Otherwise stable chronic ischemic disease in the brain since May 2019. 3. Chronic intracranial artery dolichoectasia.  MR cervical spine  Negative for fracture.  Negative for spinal stenosis  MR lumbar spine  Negative for fracture  Assessment: 85 y.o. male with a PMHx of HTN, HLD and prostate CA, who presents to outside hospital on 04/06/17 with right leg weakness. Apparently he has had 2 falls in the last 24 hours as a result of his leg weakness.  1. Exam reveals RLE weakness, subtle motor findings of RUE and mild right sided sensory loss 2. MRI brain reveals an acute small vessel white matter infarct in the left centrum semiovale near the left perirolandic cortex  Recommendations: # Transthoracic Echo, may need TEE and loop monitor  # Start patient on ASA 325 mg daily, Plavix 75 mg daily x 3 weeks  # Continue Zocor.  # BP goal: Due to advanced age, use modified permissive HTN protocol up to 180/100 mmHg # HBAIC and Lipid profile # Telemetry monitoring # Frequent neuro checks # NPO until  passes stroke swallow screen # please page stroke NP  Or  PA  Or MD from 8am -4 pm  as this patient from this time will be  followed by the stroke.   You can look them up on [email or web address]  Password [email or web address]  Assisting with this consult: Lilyan Gilford neuro hospitalist, NP  I have seen and examined the patient. I have formulated the assessment and plan.  Electronically signed: Dr. Kerney Elbe

## 2018-04-06 NOTE — ED Notes (Signed)
Spoke to pharmacy and verified patient is not allergic to aspirin. Patient takes baby aspirin every day. Stroke swallow screen needs to be done before patient takes medication.

## 2018-04-06 NOTE — ED Provider Notes (Signed)
[facility] DEPT Provider Note   CSN: [id number] Arrival date & time: 04/06/18  0240     History   Chief Complaint Chief Complaint  Patient presents with  . Fall    HPI [name] Duffy is a 85 y.o. male.  HPI   85 yo M with PMHx HTN, HLD, prostate CA here with weakness, falls. Pt reports that over the past 24 hours, he has had 2 falls. His first fall was yesterday - he states he bent down to get his wife's glasses, stood up, then tried to sit back down but missed the chair and fell backwards. He felt fine and was able to get himself up. This morning, however, he tried to get out of bed and felt "wobbly." He felt weaker on his R>L leg. He was unable to walk and subsequently fell. He denies hitting his head or neck. He was unable to get himself up, however, due to generalized weakness so he called his wife and 911 was called. Denies any new pain currently. No CP, SOB, abd pain, n/v. No recent med changes. He endorses urinary frequency but states this is not new for him since his prostatectomy.  Past Medical History:  Diagnosis Date  . Anxiety   . Arthritis    hands  . Blood transfusion without reported diagnosis    pt thinks had blood trnsfusion at prostate ca surgery   . Glaucoma   . High cholesterol   . Hypertension   . Prostate cancer [facility])     Patient Active Problem List   Diagnosis Date Noted  . Acute CVA (cerebrovascular accident) ([place]) 04/06/2018  . CVA (cerebral vascular accident) ([place]) 04/06/2018  . 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 Open angle with borderline findings, low risk, unspecified eye 10/12/2014  . Primary open angle glaucoma 10/12/2014  . Anxiety state 07/23/2013  . Blood in the urine 01/08/2013  . Genuine stress incontinence, male 12/13/2011  . H/O malignant neoplasm of prostate 01/28/2011    Past Surgical History:  Procedure Laterality Date  . COLONOSCOPY    . HERNIA REPAIR    . PROSTATECTOMY           Home Medications    Prior to Admission medications   Medication Sig Start Date End Date Taking? Authorizing Provider  ALPRAZolam Duanne Moron) 0.5 MG tablet Take 0.5 mg by mouth at bedtime.  10/29/14  Yes [provider]  aspirin EC 81 MG tablet Take 81 mg by mouth at bedtime.   Yes [provider]  latanoprost (XALATAN) 0.005 % ophthalmic solution Place 1 drop into the right eye at bedtime.  06/10/14  Yes [provider]  losartan (COZAAR) 100 MG tablet Take 100 mg by mouth.   Yes [provider]  simvastatin (ZOCOR) 20 MG tablet Take 20 mg by mouth.   Yes [provider]  lidocaine (LIDODERM) 5 % Place 1 patch onto the skin daily. Remove & Discard patch within 12 hours or as directed by MD Patient not taking: Reported on 04/06/2018 10/15/17   Suella Broad A, PA-C  Probiotic Product (ALIGN PO) Take 1 capsule by mouth daily.    [provider]    Family History Family History  Problem Relation Age of Onset  . Hypertension Other   . Colon cancer Neg Hx   . Colon polyps Neg Hx   . Esophageal cancer Neg Hx   . Rectal cancer Neg Hx   . Stomach cancer Neg Hx  Social History Social History   Tobacco Use  . Smoking status: Never Smoker  . Smokeless tobacco: Never Used  Substance Use Topics  . Alcohol use: No  . Drug use: No     Allergies   Rofecoxib   Review of Systems Review of Systems  Constitutional: Positive for fatigue. Negative for chills and fever.  HENT: Negative for congestion and rhinorrhea.   Eyes: Negative for visual disturbance.  Respiratory: Negative for cough, shortness of breath and wheezing.   Cardiovascular: Negative for chest pain and leg swelling.  Gastrointestinal: Negative for abdominal pain, diarrhea, nausea and vomiting.  Genitourinary: Negative for dysuria and flank pain.  Musculoskeletal: Positive for arthralgias (chronic, "from arthritis") and back pain ("chronic, from arthritis").  Negative for neck pain and neck stiffness.  Skin: Negative for rash and wound.  Allergic/Immunologic: Negative for immunocompromised state.  Neurological: Positive for weakness. Negative for syncope and headaches.  All other systems reviewed and are negative.    Physical Exam Updated Vital Signs BP (!) 139/100   Pulse 88   Temp 98.2 F (36.8 C) (Oral)   Resp 18   SpO2 98%   Physical Exam Vitals signs and nursing note reviewed.  Constitutional:      General: He is not in acute distress.    Appearance: He is well-developed.  HENT:     Head: Normocephalic and atraumatic.  Eyes:     Conjunctiva/sclera: Conjunctivae normal.  Neck:     Musculoskeletal: Neck supple.  Cardiovascular:     Rate and Rhythm: Normal rate and regular rhythm.     Heart sounds: Normal heart sounds. No murmur. No friction rub.  Pulmonary:     Effort: Pulmonary effort is normal. No respiratory distress.     Breath sounds: Normal breath sounds. No wheezing or rales.  Abdominal:     General: There is no distension.     Palpations: Abdomen is soft.     Tenderness: There is no abdominal tenderness.  Musculoskeletal:     Comments: Mild paraspinal TTP lumbar spine, no midline TTP or deformity  Skin:    General: Skin is warm.     Capillary Refill: Capillary refill takes less than 2 seconds.  Neurological:     Mental Status: He is alert and oriented to person, place, and time.     Motor: No abnormal muscle tone.     Neurological Exam:  Mental Status: Alert and oriented to person, place, and time. Attention and concentration normal. Speech clear. Recent memory is intact. Cranial Nerves: Visual fields grossly intact. EOMI and PERRLA. No nystagmus noted. Facial sensation intact at forehead, maxillary cheek, and chin/mandible bilaterally. No facial asymmetry or weakness. Hearing grossly normal. Uvula is midline, and palate elevates symmetrically. Normal SCM and trapezius strength. Tongue midline without  fasciculations. Motor: Muscle strength 5/5 in proximal and distal UE and LE bilaterally, though subjectively weaker on RLE. No pronator drift. Muscle tone normal. Reflexes: 2+ and symmetric al in all four extremities.  Sensation: Intact to light touch in upper and lower extremities distally bilaterally.  Gait: Deferred Coordination: Normal FTN bilaterally.  ED Treatments / Results  Labs (all labs ordered are listed, but only abnormal results are displayed) Labs Reviewed  URINALYSIS, ROUTINE W REFLEX MICROSCOPIC - Abnormal; Notable for the following components:      Result Value   Color, Urine STRAW (*)    All other components within normal limits  CBC WITH DIFFERENTIAL/PLATELET  COMPREHENSIVE METABOLIC PANEL  TROPONIN I  I-STAT TROPONIN,  ED    EKG EKG Interpretation  Date/Time:  Saturday April 06 2018 08:29:32 EST Ventricular Rate:  72 PR Interval:    QRS Duration: 143 QT Interval:  434 QTC Calculation: 475 R Axis:   85 Text Interpretation:  Sinus rhythm Borderline prolonged PR interval Right bundle branch block No significant change since last tracing Confirmed by Duffy Bruce [phone]) on 04/06/2018 9:00:43 AM   Radiology Dg Chest 2 View  Result Date: 04/06/2018 CLINICAL DATA:  Pain following fall EXAM: CHEST - 2 VIEW COMPARISON:  October 15, 2017 FINDINGS: There is stable elevation of the left hemidiaphragm. There is left base atelectasis. There is no frank edema or consolidation. Heart is upper normal in size with pulmonary vascularity normal. There is a large paraesophageal hernia. There is aortic atherosclerosis. There is no appreciable pneumothorax. There is anterior wedging lower thoracic vertebral bodies, age uncertain. IMPRESSION: Large paraesophageal hernia. Left base atelectasis. Elevation of the left hemidiaphragm, stable. No frank airspace consolidation. Stable cardiac silhouette. There is aortic atherosclerosis. Age uncertain wedge fractures in the lower thoracic  region. Aortic Atherosclerosis (ICD10-I70.0). Electronically Signed   By: Lowella Grip III M.D.   On: 04/06/2018 08:13   Dg Lumbar Spine Complete  Result Date: 04/06/2018 CLINICAL DATA:  Pain following fall EXAM: LUMBAR SPINE - COMPLETE 4+ VIEW COMPARISON:  Lumbar radiographs October 19, 2003 and lumbar MR June 26 2017 FINDINGS: Frontal, lateral, spot lumbosacral lateral, and bilateral oblique views were obtained. There are 5 non-rib-bearing lumbar type vertebral bodies. There is thoracic dextroscoliosis with rotatory component. No acute fracture or spondylolisthesis is evident in the lumbar region. There is disc space narrowing at all levels in the lumbar spine. Vacuum phenomenon is noted at L2-3. There is facet osteoarthritic change at all levels bilaterally. There is aortic atherosclerosis. IMPRESSION: Scoliosis with multilevel arthropathy. No acute fracture or spondylolisthesis. There is aortic atherosclerosis. Aortic Atherosclerosis (ICD10-I70.0). Electronically Signed   By: Lowella Grip III M.D.   On: 04/06/2018 08:15   Ct Head Wo Contrast  Result Date: 04/06/2018 CLINICAL DATA:  Fall. EXAM: CT HEAD WITHOUT CONTRAST CT CERVICAL SPINE WITHOUT CONTRAST TECHNIQUE: Multidetector CT imaging of the head and cervical spine was performed following the standard protocol without intravenous contrast. Multiplanar CT image reconstructions of the cervical spine were also generated. COMPARISON:  CT head 10/15/2017 FINDINGS: CT HEAD FINDINGS Brain: Mild atrophy. Mild chronic microvascular ischemic change in the white matter. Chronic infarcts in the cerebellum bilaterally unchanged. Negative for acute infarct, hemorrhage, or mass. Vascular: Atherosclerotic disease. Negative for hyperdense vessel. Fusiform enlargement distal right vertebral artery unchanged. Skull: Negative for fracture Sinuses/Orbits: Paranasal sinuses clear. Bilateral cataract surgery. Other: None CT CERVICAL SPINE FINDINGS Alignment: Mild  anterolisthesis C3-4.  Mild dextroscoliosis. Skull base and vertebrae: Negative for fracture Soft tissues and spinal canal: Negative for soft tissue mass. Disc levels: Disc degeneration and spurring throughout the cervical spine. Diffuse facet degeneration left greater than right throughout the cervical spine. Upper chest: Negative Other: None IMPRESSION: 1. No acute intracranial abnormality 2. Negative for cervical spine fracture.  Multilevel spondylosis. 3. Fusiform enlargement distal right vertebral artery due to atherosclerotic disease unchanged from prior studies. Electronically Signed   By: Franchot Gallo M.D.   On: 04/06/2018 08:34   Ct Cervical Spine Wo Contrast  Result Date: 04/06/2018 CLINICAL DATA:  Fall. EXAM: CT HEAD WITHOUT CONTRAST CT CERVICAL SPINE WITHOUT CONTRAST TECHNIQUE: Multidetector CT imaging of the head and cervical spine was performed following the standard protocol without intravenous contrast. Multiplanar  CT image reconstructions of the cervical spine were also generated. COMPARISON:  CT head 10/15/2017 FINDINGS: CT HEAD FINDINGS Brain: Mild atrophy. Mild chronic microvascular ischemic change in the white matter. Chronic infarcts in the cerebellum bilaterally unchanged. Negative for acute infarct, hemorrhage, or mass. Vascular: Atherosclerotic disease. Negative for hyperdense vessel. Fusiform enlargement distal right vertebral artery unchanged. Skull: Negative for fracture Sinuses/Orbits: Paranasal sinuses clear. Bilateral cataract surgery. Other: None CT CERVICAL SPINE FINDINGS Alignment: Mild anterolisthesis C3-4.  Mild dextroscoliosis. Skull base and vertebrae: Negative for fracture Soft tissues and spinal canal: Negative for soft tissue mass. Disc levels: Disc degeneration and spurring throughout the cervical spine. Diffuse facet degeneration left greater than right throughout the cervical spine. Upper chest: Negative Other: None IMPRESSION: 1. No acute intracranial abnormality 2.  Negative for cervical spine fracture.  Multilevel spondylosis. 3. Fusiform enlargement distal right vertebral artery due to atherosclerotic disease unchanged from prior studies. Electronically Signed   By: Franchot Gallo M.D.   On: 04/06/2018 08:34   Mr Brain Wo Contrast  Result Date: 04/06/2018 CLINICAL DATA:  85 year old male with recent falls. Right leg cramps. EXAM: MRI HEAD WITHOUT CONTRAST TECHNIQUE: Multiplanar, multiecho pulse sequences of the brain and surrounding structures were obtained without intravenous contrast. COMPARISON:  Head and cervical spine CT earlier today. Brain MRI 08/01/2017. FINDINGS: Brain: Oval 12 millimeter focus of restricted diffusion in the left posterior centrum semiovale near the perirolandic cortex (series 4, image 28). Minimal T2 hyperintensity. No hemorrhage or mass effect. No other restricted diffusion. Stable cerebral volume. Patchy and widely scattered cerebral white matter T2 and FLAIR hyperintensity outside of the acute finding is stable. There is a small area of chronic cortical encephalomalacia in the left superior frontal gyrus (series 7, image 25). No chronic cerebral blood products. Mild to moderate for age chronic T2 heterogeneity in the deep gray matter nuclei. Chronic bilateral posterior cerebellar infarcts. No midline shift, mass effect, evidence of mass lesion, ventriculomegaly, extra-axial collection or acute intracranial hemorrhage. Cervicomedullary junction and pituitary are within normal limits. Vascular: Major intracranial vascular flow voids are stable since 2019. Dominant right vertebral artery with moderate fusiform dolichoectasia in the posterior fossa again noted. The left ICA siphon and terminus also are ectatic. Skull and upper cervical spine: Negative visible cervical spine. Visualized bone marrow signal is within normal limits. Sinuses/Orbits: Postoperative changes to both globes since the prior MRI. Stable paranasal sinuses. Other: Mastoids  remain clear. Visible internal auditory structures appear normal. Scalp and face soft tissues appear negative. IMPRESSION: 1. Acute small vessel white matter infarct in the left centrum semiovale near the left perirolandic cortex. No hemorrhage or mass effect. 2. Otherwise stable chronic ischemic disease in the brain since May 2019. 3. Chronic intracranial artery dolichoectasia. Electronically Signed   By: Genevie Ann M.D.   On: 04/06/2018 13:09   Mr Cervical Spine Wo Contrast  Result Date: 04/06/2018 CLINICAL DATA:  Fall.  Neck pain EXAM: MRI CERVICAL SPINE WITHOUT CONTRAST TECHNIQUE: Multiplanar, multisequence MR imaging of the cervical spine was performed. No intravenous contrast was administered. COMPARISON:  CT cervical spine today 04/06/2018 FINDINGS: Alignment: Normal alignment.  Mild dextroscoliosis. Vertebrae: Negative for fracture or bone marrow edema. Cord: Negative for cord compression. Cord signal normal. No epidural hematoma. Posterior Fossa, vertebral arteries, paraspinal tissues: Negative Disc levels: C2-3: Mild facet degeneration on the left.  Negative for stenosis C3-4: Mild disc and mild facet degeneration. Mild foraminal narrowing bilaterally. C4-5: Disc degeneration and spurring. Mild facet degeneration. Negative for stenosis. C5-6: Mild  right foraminal narrowing due to spurring. C6-7: Mild disc degeneration.  Negative for stenosis C7-T1: Mild facet degeneration on the left. No significant stenosis. IMPRESSION: Negative for fracture.  Negative for spinal stenosis Degenerative changes as above. Electronically Signed   By: Franchot Gallo M.D.   On: 04/06/2018 13:30   Mr Lumbar Spine Wo Contrast  Result Date: 04/06/2018 CLINICAL DATA:  Low back pain.  Recent falls.  Right leg cramps EXAM: MRI LUMBAR SPINE WITHOUT CONTRAST TECHNIQUE: Multiplanar, multisequence MR imaging of the lumbar spine was performed. No intravenous contrast was administered. COMPARISON:  MRI lumbar spine 06/26/2017  FINDINGS: Segmentation:  Normal Alignment:  Dextroscoliosis.  Mild retrolisthesis L1-2 and L2-3 Vertebrae:  Negative for fracture or mass lesion. Conus medullaris and cauda equina: Conus extends to the L1-2 level. Conus and cauda equina appear normal. Paraspinal and other soft tissues: Bilateral renal cysts. No retroperitoneal mass or adenopathy Disc levels: T12-L1: Mild disc degeneration L1-2: Disc degeneration and spurring on the left with moderate subarticular stenosis, unchanged. L2-3: Disc degeneration and spurring on the left. Mild subarticular stenosis on the left unchanged. L3-4: Mild disc degeneration and mild spurring left greater than right. Mild facet degeneration. Mild subarticular stenosis on the left L4-5: Disc degeneration with disc bulging and spurring. Mild subarticular stenosis on the right L5-S1: Bilateral facet degeneration. Mild right foraminal narrowing due to spurring, unchanged. IMPRESSION: Negative for fracture Scoliosis and multilevel degenerative change, stable from the prior MRI. Electronically Signed   By: Franchot Gallo M.D.   On: 04/06/2018 13:24   Dg Hip Unilat W Or Wo Pelvis 2-3 Views Right  Result Date: 04/06/2018 CLINICAL DATA:  Per EMS, patient coming from [facility] with complaints of two witnessed falls. First fall was yesterday afternoon. He was bending over to pick something up from the floor and fell. Did not get evaluated after this incident. This morning he was going to use the urinal and had some pain/weakness in the right leg which led to a fall. Patient is currently complaining of pain in the right leg but was able to ambulate to the stretcher with EMS. EXAM: DG HIP (WITH OR WITHOUT PELVIS) 2-3V RIGHT COMPARISON:  None. FINDINGS: No fracture.  No bone lesion. Hip joints, SI joints and symphysis pubis are normally aligned. There are surgical vascular clips in the pelvis consistent with a prior prostatectomy. Soft tissues otherwise  unremarkable. IMPRESSION: 1. No fracture, dislocation or significant joint abnormality. Electronically Signed   By: Lajean Manes M.D.   On: 04/06/2018 09:34    Procedures Procedures (including critical care time)  Medications Ordered in ED Medications  aspirin tablet 325 mg (325 mg Oral Given 04/06/18 1551)  LORazepam (ATIVAN) injection 0.5 mg (0.5 mg Intravenous Given 04/06/18 1225)     Initial Impression / Assessment and Plan / ED Course  I have reviewed the triage vital signs and the nursing notes.  Pertinent labs & imaging results that were available during my care of the patient were reviewed by me and considered in my medical decision making (see chart for details).  Clinical Course as of Apr 07 1599  Sat Apr 06, 2018  0922 85 yo M with PMHx as above here w/ fall, R>L weakness. DDx includes CVA, cervical radiculopathy given UE and LE involvement, also dehydration, weakness 2/2 general medical condition. Initial CBC, UA, trop unremarkable. Imaging neg for acute abnormality. VS are stable. Will f/u remainder of labs, consider MRI given unilateral weakness.   [CI]  Clinical Course User Index [CI] Duffy Bruce, MD    MRI shows acute small vessel CVA. No spinal compression or other acute abnormality. D/w Dr. Cheral Marker, Neurology. Will admit to [facility], for stroke admission.  Final Clinical Impressions(s) / ED Diagnoses   Final diagnoses:  Cerebrovascular accident (CVA) due to stenosis of small artery [facility])    ED Discharge Orders    None       Duffy Bruce, MD 04/06/18 1601

## 2018-04-06 NOTE — H&P (Addendum)
[facility] History and Physical  EPIC TRIBBETT PZW:[id number] DOB: 10/26/1929 DOA: 04/06/2018  Referring physician: ED  PCP: Burnard Bunting, MD   Chief Complaint: Right leg weakness, falls  HPI: [name] Duffy is a 85 y.o. male with past medical history of hypertension, falls, lipidemia and arthritis, presented to the hospital with complaints of fall and right leg weakness.  At baseline, patient uses a cane for ambulation and is at independent living facility.  Patient's nephew at bedside was available for more history taking.  Patient has had almost 2-3 falls over the last 6 months or so.  Patient had fall yesterday and this morning episode this morning was very different with inability to weight-bear on the right side.  Patient denied hurting his head or neck but was unable to get up by himself.  EMS was then called in and patient was brought into the hospital.  Patient denies any slurred speech difficulty swallowing dizziness lightheadedness chest pain palpitation prior to this event.  He denied focal arm weakness as well.  Denies history of CVA in the past.  Patient has some urinary frequency but does not have dysuria or burning micturition.  Patient denies any fever, chills or rigors.  Denies any changes in his medication except for Xanax which he takes before he goes to bed but this is not a new medication.  Patient denied any nausea, vomiting or diarrhea.  There is no recent changes in his bowel habits.  He currently lives with his wife at a independent living facility and has somebody to help his wife at his facility.  ED Course: In the ED, patient underwent MRI scan of the head which showed left centrum semiovale stroke.  Patient was then considered for admission to the hospital.  Neurology was notified from the ED (Dr Cheral Marker) who recommended transfer to [facility].  Review of Systems:  All systems were reviewed and were negative unless otherwise mentioned in  the HPI  Past Medical History:  Diagnosis Date  . Anxiety   . Arthritis    hands  . Blood transfusion without reported diagnosis    pt thinks had blood trnsfusion at prostate ca surgery   . Glaucoma   . High cholesterol   . Hypertension   . Prostate cancer [facility])    Past Surgical History:  Procedure Laterality Date  . COLONOSCOPY    . HERNIA REPAIR    . PROSTATECTOMY      Social History:  reports that he has never smoked. He has never used smokeless tobacco. He reports that he does not drink alcohol or use drugs.  Allergies  Allergen Reactions  . Rofecoxib Swelling    REACTION: swelling- Vioxx name brand     Family History  Problem Relation Age of Onset  . Hypertension Other   . Colon cancer Neg Hx   . Colon polyps Neg Hx   . Esophageal cancer Neg Hx   . Rectal cancer Neg Hx   . Stomach cancer Neg Hx      Prior to Admission medications   Medication Sig Start Date End Date Taking? Authorizing Provider  ALPRAZolam Duanne Moron) 0.5 MG tablet TAKE (1) TABLET TWICE A DAY AS NEEDED. 10/29/14   [provider]  B Complex Vitamins (VITAMIN B COMPLEX PO) Take 1 capsule by mouth daily.    [provider]  citalopram (CELEXA) 20 MG tablet  06/21/14   [provider]  latanoprost (XALATAN) 0.005 % ophthalmic solution 1 drop.  06/10/14   [provider]  lidocaine (LIDODERM) 5 % Place 1 patch onto the skin daily. Remove & Discard patch within 12 hours or as directed by MD 10/15/17   Tacy Learn, PA-C  losartan (COZAAR) 100 MG tablet Take 100 mg by mouth.    [provider]  Probiotic Product (ALIGN PO) Take 1 capsule by mouth daily.    [provider]  psyllium (METAMUCIL) 58.6 % powder Take 1 packet by mouth daily. 1 tsp mixed in water once a day    [provider]  simvastatin (ZOCOR) 20 MG tablet Take 20 mg by mouth.    [provider]  timolol (BETIMOL) 0.5 % ophthalmic solution Place 1 drop into the right eye 2  (two) times daily.    [provider]    Physical Exam: Vitals:   04/06/18 1014 04/06/18 1100 04/06/18 1354 04/06/18 1355  BP: (!) 142/90 138/90 (!) 152/104   Pulse: 75 77 84 84  Resp: 18 20 18    Temp:      TempSrc:      SpO2: 98% 98% 100% 100%   Wt Readings from Last 3 Encounters:  10/15/17 81.6 kg  09/13/16 82.6 kg  08/10/16 82.6 kg   There is no height or weight on file to calculate BMI.  General:  Average built, not in obvious distress, clear to speech HENT: Normocephalic, pupils equally reacting to light and accommodation.  No scleral pallor or icterus noted. Oral mucosa is moist.  Chest:  Clear breath sounds.  Diminished breath sounds bilaterally. No crackles or wheezes.  CVS: S1 &S2 heard. No murmur.  Regular rate and rhythm. Abdomen: Soft, nontender, nondistended.  Bowel sounds are heard.  Liver is not palpable, no abdominal mass palpated Extremities: No cyanosis, clubbing or edema.  Peripheral pulses are palpable.  Mild right lower extremity weakness compared to the left, 4/5 in the right lower extremity Psych: Alert, awake and oriented, normal mood CNS:  No cranial nerve deficits.  Mild right lower extremity weakness.   Skin: Warm and dry.  No rashes noted.  Labs on Admission:  Basic Metabolic Panel: Recent Labs  Lab 04/06/18 0840  NA 142  K 3.9  CL 106  CO2 27  GLUCOSE 97  BUN 19  CREATININE 1.05  CALCIUM 10.1   Liver Function Tests: Recent Labs  Lab 04/06/18 0840  AST 22  ALT 22  ALKPHOS 53  BILITOT 1.2  PROT 6.7  ALBUMIN 4.1   No results for input(s): LIPASE, AMYLASE in the last 168 hours. No results for input(s): AMMONIA in the last 168 hours. CBC: Recent Labs  Lab 04/06/18 0840  WBC 6.8  NEUTROABS 4.2  HGB 14.4  HCT 45.4  MCV 90.3  PLT 193   Cardiac Enzymes: Recent Labs  Lab 04/06/18 0840  TROPONINI <0.03    BNP (last 3 results) No results for input(s): BNP in the last 8760 hours.  ProBNP (last 3 results) No  results for input(s): PROBNP in the last 8760 hours.  CBG: No results for input(s): GLUCAP in the last 168 hours.   Radiological Exams on Admission: Dg Chest 2 View  Result Date: 04/06/2018 CLINICAL DATA:  Pain following fall EXAM: CHEST - 2 VIEW COMPARISON:  October 15, 2017 FINDINGS: There is stable elevation of the left hemidiaphragm. There is left base atelectasis. There is no frank edema or consolidation. Heart is upper normal in size with pulmonary vascularity normal. There is a large paraesophageal hernia. There  is aortic atherosclerosis. There is no appreciable pneumothorax. There is anterior wedging lower thoracic vertebral bodies, age uncertain. IMPRESSION: Large paraesophageal hernia. Left base atelectasis. Elevation of the left hemidiaphragm, stable. No frank airspace consolidation. Stable cardiac silhouette. There is aortic atherosclerosis. Age uncertain wedge fractures in the lower thoracic region. Aortic Atherosclerosis (ICD10-I70.0). Electronically Signed   By: Lowella Grip III M.D.   On: 04/06/2018 08:13   Dg Lumbar Spine Complete  Result Date: 04/06/2018 CLINICAL DATA:  Pain following fall EXAM: LUMBAR SPINE - COMPLETE 4+ VIEW COMPARISON:  Lumbar radiographs October 19, 2003 and lumbar MR June 26 2017 FINDINGS: Frontal, lateral, spot lumbosacral lateral, and bilateral oblique views were obtained. There are 5 non-rib-bearing lumbar type vertebral bodies. There is thoracic dextroscoliosis with rotatory component. No acute fracture or spondylolisthesis is evident in the lumbar region. There is disc space narrowing at all levels in the lumbar spine. Vacuum phenomenon is noted at L2-3. There is facet osteoarthritic change at all levels bilaterally. There is aortic atherosclerosis. IMPRESSION: Scoliosis with multilevel arthropathy. No acute fracture or spondylolisthesis. There is aortic atherosclerosis. Aortic Atherosclerosis (ICD10-I70.0). Electronically Signed   By: Lowella Grip III  M.D.   On: 04/06/2018 08:15   Ct Head Wo Contrast  Result Date: 04/06/2018 CLINICAL DATA:  Fall. EXAM: CT HEAD WITHOUT CONTRAST CT CERVICAL SPINE WITHOUT CONTRAST TECHNIQUE: Multidetector CT imaging of the head and cervical spine was performed following the standard protocol without intravenous contrast. Multiplanar CT image reconstructions of the cervical spine were also generated. COMPARISON:  CT head 10/15/2017 FINDINGS: CT HEAD FINDINGS Brain: Mild atrophy. Mild chronic microvascular ischemic change in the white matter. Chronic infarcts in the cerebellum bilaterally unchanged. Negative for acute infarct, hemorrhage, or mass. Vascular: Atherosclerotic disease. Negative for hyperdense vessel. Fusiform enlargement distal right vertebral artery unchanged. Skull: Negative for fracture Sinuses/Orbits: Paranasal sinuses clear. Bilateral cataract surgery. Other: None CT CERVICAL SPINE FINDINGS Alignment: Mild anterolisthesis C3-4.  Mild dextroscoliosis. Skull base and vertebrae: Negative for fracture Soft tissues and spinal canal: Negative for soft tissue mass. Disc levels: Disc degeneration and spurring throughout the cervical spine. Diffuse facet degeneration left greater than right throughout the cervical spine. Upper chest: Negative Other: None IMPRESSION: 1. No acute intracranial abnormality 2. Negative for cervical spine fracture.  Multilevel spondylosis. 3. Fusiform enlargement distal right vertebral artery due to atherosclerotic disease unchanged from prior studies. Electronically Signed   By: Franchot Gallo M.D.   On: 04/06/2018 08:34   Ct Cervical Spine Wo Contrast  Result Date: 04/06/2018 CLINICAL DATA:  Fall. EXAM: CT HEAD WITHOUT CONTRAST CT CERVICAL SPINE WITHOUT CONTRAST TECHNIQUE: Multidetector CT imaging of the head and cervical spine was performed following the standard protocol without intravenous contrast. Multiplanar CT image reconstructions of the cervical spine were also generated.  COMPARISON:  CT head 10/15/2017 FINDINGS: CT HEAD FINDINGS Brain: Mild atrophy. Mild chronic microvascular ischemic change in the white matter. Chronic infarcts in the cerebellum bilaterally unchanged. Negative for acute infarct, hemorrhage, or mass. Vascular: Atherosclerotic disease. Negative for hyperdense vessel. Fusiform enlargement distal right vertebral artery unchanged. Skull: Negative for fracture Sinuses/Orbits: Paranasal sinuses clear. Bilateral cataract surgery. Other: None CT CERVICAL SPINE FINDINGS Alignment: Mild anterolisthesis C3-4.  Mild dextroscoliosis. Skull base and vertebrae: Negative for fracture Soft tissues and spinal canal: Negative for soft tissue mass. Disc levels: Disc degeneration and spurring throughout the cervical spine. Diffuse facet degeneration left greater than right throughout the cervical spine. Upper chest: Negative Other: None IMPRESSION: 1. No acute intracranial abnormality 2.  Negative for cervical spine fracture.  Multilevel spondylosis. 3. Fusiform enlargement distal right vertebral artery due to atherosclerotic disease unchanged from prior studies. Electronically Signed   By: Franchot Gallo M.D.   On: 04/06/2018 08:34   Mr Brain Wo Contrast  Result Date: 04/06/2018 CLINICAL DATA:  85 year old male with recent falls. Right leg cramps. EXAM: MRI HEAD WITHOUT CONTRAST TECHNIQUE: Multiplanar, multiecho pulse sequences of the brain and surrounding structures were obtained without intravenous contrast. COMPARISON:  Head and cervical spine CT earlier today. Brain MRI 08/01/2017. FINDINGS: Brain: Oval 12 millimeter focus of restricted diffusion in the left posterior centrum semiovale near the perirolandic cortex (series 4, image 28). Minimal T2 hyperintensity. No hemorrhage or mass effect. No other restricted diffusion. Stable cerebral volume. Patchy and widely scattered cerebral white matter T2 and FLAIR hyperintensity outside of the acute finding is stable. There is a small  area of chronic cortical encephalomalacia in the left superior frontal gyrus (series 7, image 25). No chronic cerebral blood products. Mild to moderate for age chronic T2 heterogeneity in the deep gray matter nuclei. Chronic bilateral posterior cerebellar infarcts. No midline shift, mass effect, evidence of mass lesion, ventriculomegaly, extra-axial collection or acute intracranial hemorrhage. Cervicomedullary junction and pituitary are within normal limits. Vascular: Major intracranial vascular flow voids are stable since 2019. Dominant right vertebral artery with moderate fusiform dolichoectasia in the posterior fossa again noted. The left ICA siphon and terminus also are ectatic. Skull and upper cervical spine: Negative visible cervical spine. Visualized bone marrow signal is within normal limits. Sinuses/Orbits: Postoperative changes to both globes since the prior MRI. Stable paranasal sinuses. Other: Mastoids remain clear. Visible internal auditory structures appear normal. Scalp and face soft tissues appear negative. IMPRESSION: 1. Acute small vessel white matter infarct in the left centrum semiovale near the left perirolandic cortex. No hemorrhage or mass effect. 2. Otherwise stable chronic ischemic disease in the brain since May 2019. 3. Chronic intracranial artery dolichoectasia. Electronically Signed   By: Genevie Ann M.D.   On: 04/06/2018 13:09   Mr Cervical Spine Wo Contrast  Result Date: 04/06/2018 CLINICAL DATA:  Fall.  Neck pain EXAM: MRI CERVICAL SPINE WITHOUT CONTRAST TECHNIQUE: Multiplanar, multisequence MR imaging of the cervical spine was performed. No intravenous contrast was administered. COMPARISON:  CT cervical spine today 04/06/2018 FINDINGS: Alignment: Normal alignment.  Mild dextroscoliosis. Vertebrae: Negative for fracture or bone marrow edema. Cord: Negative for cord compression. Cord signal normal. No epidural hematoma. Posterior Fossa, vertebral arteries, paraspinal tissues: Negative  Disc levels: C2-3: Mild facet degeneration on the left.  Negative for stenosis C3-4: Mild disc and mild facet degeneration. Mild foraminal narrowing bilaterally. C4-5: Disc degeneration and spurring. Mild facet degeneration. Negative for stenosis. C5-6: Mild right foraminal narrowing due to spurring. C6-7: Mild disc degeneration.  Negative for stenosis C7-T1: Mild facet degeneration on the left. No significant stenosis. IMPRESSION: Negative for fracture.  Negative for spinal stenosis Degenerative changes as above. Electronically Signed   By: Franchot Gallo M.D.   On: 04/06/2018 13:30   Mr Lumbar Spine Wo Contrast  Result Date: 04/06/2018 CLINICAL DATA:  Low back pain.  Recent falls.  Right leg cramps EXAM: MRI LUMBAR SPINE WITHOUT CONTRAST TECHNIQUE: Multiplanar, multisequence MR imaging of the lumbar spine was performed. No intravenous contrast was administered. COMPARISON:  MRI lumbar spine 06/26/2017 FINDINGS: Segmentation:  Normal Alignment:  Dextroscoliosis.  Mild retrolisthesis L1-2 and L2-3 Vertebrae:  Negative for fracture or mass lesion. Conus medullaris and cauda equina: Conus extends to the  L1-2 level. Conus and cauda equina appear normal. Paraspinal and other soft tissues: Bilateral renal cysts. No retroperitoneal mass or adenopathy Disc levels: T12-L1: Mild disc degeneration L1-2: Disc degeneration and spurring on the left with moderate subarticular stenosis, unchanged. L2-3: Disc degeneration and spurring on the left. Mild subarticular stenosis on the left unchanged. L3-4: Mild disc degeneration and mild spurring left greater than right. Mild facet degeneration. Mild subarticular stenosis on the left L4-5: Disc degeneration with disc bulging and spurring. Mild subarticular stenosis on the right L5-S1: Bilateral facet degeneration. Mild right foraminal narrowing due to spurring, unchanged. IMPRESSION: Negative for fracture Scoliosis and multilevel degenerative change, stable from the prior MRI.  Electronically Signed   By: Franchot Gallo M.D.   On: 04/06/2018 13:24   Dg Hip Unilat W Or Wo Pelvis 2-3 Views Right  Result Date: 04/06/2018 CLINICAL DATA:  Per EMS, patient coming from [place] with complaints of two witnessed falls. First fall was yesterday afternoon. He was bending over to pick something up from the floor and fell. Did not get evaluated after this incident. This morning he was going to use the urinal and had some pain/weakness in the right leg which led to a fall. Patient is currently complaining of pain in the right leg but was able to ambulate to the stretcher with EMS. EXAM: DG HIP (WITH OR WITHOUT PELVIS) 2-3V RIGHT COMPARISON:  None. FINDINGS: No fracture.  No bone lesion. Hip joints, SI joints and symphysis pubis are normally aligned. There are surgical vascular clips in the pelvis consistent with a prior prostatectomy. Soft tissues otherwise unremarkable. IMPRESSION: 1. No fracture, dislocation or significant joint abnormality. Electronically Signed   By: Lajean Manes M.D.   On: 04/06/2018 09:34    EKG: Personally reviewed by me which shows normal sinus rhythm, right bundle branch block  Assessment/Plan Principal Problem:   Acute CVA (cerebrovascular accident) [facility]) Active Problems:   Anxiety state   H/O malignant neoplasm of prostate  Acute CVA.  Will obtain carotid duplex ultrasound, 2D echocardiogram.   MRI of the brain shows left centrum semiovale CVA.  Will telemetry monitor.  Neurochecks every 4 hourly.  Consider statins.  Will obtain neurology consultation.  Get a TSH, lipid profile A1c.  Noted allergic to aspirin but is taking baby aspirin at home.  Give 1 dose of aspirin today.  Continue aspirin from tomorrow  Anxiety insomnia.  Patient takes Xanax.  I do not want to discontinue this at this time but the patient was explained the risk of falls with it.  Hypertension.  Will allow permissive hypertension.  He is on losartan as  outpatient.  Will resume  Hyperlipidemia.  Continue simvastatin.  Generalized weakness, recurrent falls.  Will get physical therapy, occupational therapy evaluation.  He was counseled against Xanax which could contribute to fall.  History of glaucoma on timolol.  We will continue with that  Consultant: Neurology has been notified  Code Status: DNR  DVT Prophylaxis: Lovenox  Antibiotics: None  Family Communication:  Patients' condition and plan of care including tests being ordered have been discussed with the patient and nephew who indicate understanding and agree with the plan.  Disposition Plan: To rehab in 2 to 3 days.  [facility]  Severity of Illness: The appropriate patient status for this patient is INPATIENT. Inpatient status is judged to be reasonable and necessary in order to provide the required intensity of service to ensure the  patient's safety. The patient's presenting symptoms, physical exam findings, and initial radiographic and laboratory data in the context of their chronic comorbidities is felt to place them at high risk for further clinical deterioration. Furthermore, it is not anticipated that the patient will be medically stable for discharge from the hospital within 2 midnights of admission. I certify that at the point of admission it is my clinical judgment that the patient will require inpatient hospital care spanning beyond 2 midnights from the point of admission due to high intensity of service, high risk for further deterioration and high frequency of surveillance required.   Signed, Flora Lipps, MD [facility] 04/06/2018

## 2018-04-06 NOTE — ED Notes (Signed)
Patient transported to X-ray 

## 2018-04-06 NOTE — ED Notes (Signed)
Unable to get orthostatic vital signs, patient had continuous muscle cramps in left leg

## 2018-04-06 NOTE — ED Notes (Signed)
Condom cath placed.

## 2018-04-06 NOTE — ED Notes (Signed)
Nephew at beside.

## 2018-04-07 ENCOUNTER — Inpatient Hospital Stay (HOSPITAL_COMMUNITY): Payer: Medicare Other

## 2018-04-07 DIAGNOSIS — I639 Cerebral infarction, unspecified: Secondary | ICD-10-CM

## 2018-04-07 DIAGNOSIS — M79609 Pain in unspecified limb: Secondary | ICD-10-CM

## 2018-04-07 DIAGNOSIS — I6789 Other cerebrovascular disease: Secondary | ICD-10-CM

## 2018-04-07 LAB — CBC
HCT: 49.2 % (ref 39.0–52.0)
Hemoglobin: 15.8 g/dL (ref 13.0–17.0)
MCH: 27.6 pg (ref 26.0–34.0)
MCHC: 32.1 g/dL (ref 30.0–36.0)
MCV: 85.9 fL (ref 80.0–100.0)
NRBC: 0 % (ref 0.0–0.2)
Platelets: 179 10*3/uL (ref 150–400)
RBC: 5.73 MIL/uL (ref 4.22–5.81)
RDW: 12.5 % (ref 11.5–15.5)
WBC: 6.9 10*3/uL (ref 4.0–10.5)

## 2018-04-07 LAB — COMPREHENSIVE METABOLIC PANEL
ALT: 21 U/L (ref 0–44)
AST: 25 U/L (ref 15–41)
Albumin: 3.9 g/dL (ref 3.5–5.0)
Alkaline Phosphatase: 61 U/L (ref 38–126)
Anion gap: 11 (ref 5–15)
BUN: 13 mg/dL (ref 8–23)
CO2: 23 mmol/L (ref 22–32)
Calcium: 10.2 mg/dL (ref 8.9–10.3)
Chloride: 105 mmol/L (ref 98–111)
Creatinine, Ser: 0.95 mg/dL (ref 0.61–1.24)
GFR calc non Af Amer: >60 mL/min (ref >60)
Glucose, Bld: 91 mg/dL (ref 70–99)
Potassium: 4.1 mmol/L (ref 3.5–5.1)
SODIUM: 139 mmol/L (ref 135–145)
Total Bilirubin: 1.1 mg/dL (ref 0.3–1.2)
Total Protein: 7.1 g/dL (ref 6.5–8.1)

## 2018-04-07 LAB — ECHOCARDIOGRAM COMPLETE: Weight: 2511.48 oz

## 2018-04-07 LAB — LIPID PANEL
Cholesterol: 160 mg/dL (ref 0–200)
HDL: 47 mg/dL (ref >40)
LDL Cholesterol: 89 mg/dL (ref 0–99)
Total CHOL/HDL Ratio: 3.4 RATIO
Triglycerides: 122 mg/dL (ref <150)
VLDL: 24 mg/dL (ref 0–40)

## 2018-04-07 LAB — MAGNESIUM: Magnesium: 2.2 mg/dL (ref 1.7–2.4)

## 2018-04-07 LAB — PHOSPHORUS: Phosphorus: 2.8 mg/dL (ref 2.5–4.6)

## 2018-04-07 MED ORDER — ATORVASTATIN CALCIUM 10 MG PO TABS
20.0000 mg | ORAL_TABLET | Freq: Every day | ORAL | Status: DC
  Administered 2018-04-07 – 2018-04-10 (×4): 20 mg via ORAL
  Filled 2018-04-07 (×5): qty 2

## 2018-04-07 MED ORDER — ASPIRIN EC 81 MG PO TBEC
81.0000 mg | DELAYED_RELEASE_TABLET | Freq: Every day | ORAL | Status: DC
  Administered 2018-04-08 – 2018-04-10 (×3): 81 mg via ORAL
  Filled 2018-04-07 (×3): qty 1

## 2018-04-07 NOTE — Progress Notes (Signed)
Pt is currently on amlodipine and zocor 40mg /day. This combo increases the risk for rhabdo due to the dose of Zocor is >20mg . Per protocol, we will interchange zocor 40 for Lipitor 20mg /day.  Onnie Boer, PharmD, BCIDP, AAHIVP, CPP Infectious Disease Pharmacist 04/07/2018 2:55 PM

## 2018-04-07 NOTE — Progress Notes (Signed)
Bilateral carotid duplex completed - Preliminary results can be found in Chart review under CV Proc. [facility], [place] 04/07/2018 10:46 AM

## 2018-04-07 NOTE — Progress Notes (Signed)
Bilateral lower extremity venous duplex completed - Preliminary results Negative for DVT. Incidental findings arterail signals are triphasic throughout. Full report found in Chart review under CV Proc. Kolette Vey,RVS 04/07/2018,1:45 PM

## 2018-04-07 NOTE — Evaluation (Signed)
Occupational Therapy Evaluation Patient Details Name: [name] Duffy MRN: [id number] DOB: 1930-02-28 Today's Date: 04/07/2018    History of Present Illness Patient is a 85 y/o male who presents with RLE weakness and 2 witnessed falls at home. 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 PMH includes HTN, glaucoma, prostate ca, anxiety.    Clinical Impression   This 85 yo male admitted with above presents to acute OT with decreased standing balance, decreased activity tolerance (gets tired quicker and starts dragging RLE), now needing to use a RW v. SPC that he was using and normally is independent with all his basic ADLs and IADLs as well as A his wife prn who is paraplegic. He will benefit from continued acute OT with follow up OT on CIR to get back to a SPC level and independent to Mod I with basic ADLs.    Follow Up Recommendations  CIR;Supervision - Intermittent    Equipment Recommendations  Other (comment)(TBD next venue)       Precautions / Restrictions Precautions Precautions: Fall Precaution Comments: watch BP (high today) Restrictions Weight Bearing Restrictions: No      Mobility Bed Mobility Overal bed mobility: Needs Assistance Bed Mobility: Supine to Sit     Supine to sit: Supervision;HOB elevated     General bed mobility comments: Pt finishing up with PT when I entered so had pt sit down in chair to rest   Transfers Overall transfer level: Needs assistance Equipment used: Rolling walker (2 wheeled) Transfers: Sit to/from Stand Sit to Stand: Min assist         General transfer comment: increased time and effort to stand    Balance Overall balance assessment: Needs assistance;History of Falls Sitting-balance support: Feet supported;No upper extremity supported Sitting balance-Leahy Scale: Fair     Standing balance support: During functional activity;Single extremity supported Standing balance-Leahy Scale: Poor Standing balance comment:  required alternating unilateral support in standing to wash hands and face                           ADL either performed or assessed with clinical judgement   ADL Overall ADL's : Needs assistance/impaired Eating/Feeding: Independent;Sitting   Grooming: Minimal assistance;Standing Grooming Details (indicate cue type and reason): increased time and effort to stand Upper Body Bathing: Supervision/ safety;Set up;Sitting   Lower Body Bathing: Minimal assistance;Sit to/from stand Lower Body Bathing Details (indicate cue type and reason): increased time and effort to stand Upper Body Dressing : Set up;Supervision/safety;Sitting   Lower Body Dressing: Minimal assistance;Sit to/from stand Lower Body Dressing Details (indicate cue type and reason): increased time and effort to stand Toilet Transfer: Minimal assistance;Ambulation;BSC Toilet Transfer Details (indicate cue type and reason): over toilet Toileting- Clothing Manipulation and Hygiene: Minimal assistance Toileting - Clothing Manipulation Details (indicate cue type and reason): increased time and effort to stand             Vision Baseline Vision/History: Wears glasses Wears Glasses: At all times Patient Visual Report: No change from baseline              Pertinent Vitals/Pain Pain Assessment: 0-10 Pain Score: 7  Pain Location: neck Pain Descriptors / Indicators: Sore Pain Intervention(s): Monitored during session     Hand Dominance Right   Extremity/Trunk Assessment Upper Extremity Assessment Upper Extremity Assessment: Overall WFL for tasks assessed   Lower Extremity Assessment Lower Extremity Assessment: RLE deficits/detail RLE Deficits / Details: Grossly ~4/5 throughout RLE Sensation:  WNL RLE Coordination: WNL   Cervical / Trunk Assessment Cervical / Trunk Assessment: Kyphotic   Communication Communication Communication: HOH(has hearing aid that he can turn up prn)   Cognition  Arousal/Alertness: Awake/alert Behavior During Therapy: WFL for tasks assessed/performed Overall Cognitive Status: Within Functional Limits for tasks assessed                                 General Comments: for basic mobility tasks. Slow to respond at times. Does not know exact date but knows year and month.    General Comments  BP 156/108 pre activity, BP 149/112 post activity.             Home Living Family/patient expects to be discharged to:: Other (Comment)([facility] [place] [facility]) Living Arrangements: Spouse/significant other Available Help at Discharge: Personal care attendant;Family;Available PRN/intermittently Type of Home: Apartment Home Access: Elevator     Home Layout: One level     Bathroom Shower/Tub: Tub/shower unit;Curtain   Bathroom Toilet: Handicapped height     Home Equipment: Cane - single point;Wheelchair - manual;Grab bars - toilet;Grab bars - tub/shower          Prior Functioning/Environment Level of Independence: Independent with assistive device(s)        Comments: Uses SPC for ambulation. Fell backwards into tub 1st of the year. Has PCA come in to help care for wife everyday for a few hours. Wife is a paraplegic. Pt helps with ADls (changes briefs, helps with transfers). Drives. Does some cooking.         OT Problem List: Impaired balance (sitting and/or standing);Decreased strength      OT Treatment/Interventions: Self-care/ADL training;Balance training;DME and/or AE instruction;Patient/family education    OT Goals(Current goals can be found in the care plan section) Acute Rehab OT Goals Patient Stated Goal: to get back home to my wife OT Goal Formulation: With patient Time For Goal Achievement: 04/21/18 Potential to Achieve Goals: Good  OT Frequency: Min 2X/week              AM-PAC OT "6 Clicks" Daily Activity     Outcome Measure Help from another person eating meals?: None Help from another  person taking care of personal grooming?: A Little Help from another person toileting, which includes using toliet, bedpan, or urinal?: A Little Help from another person bathing (including washing, rinsing, drying)?: A Little Help from another person to put on and taking off regular upper body clothing?: A Little Help from another person to put on and taking off regular lower body clothing?: A Little 6 Click Score: 19   End of Session Equipment Utilized During Treatment: Gait belt;Rolling walker  Activity Tolerance: Patient tolerated treatment well Patient left: in chair;with call bell/phone within reach;with chair alarm set  OT Visit Diagnosis: Unsteadiness on feet (R26.81);Other abnormalities of gait and mobility (R26.89);Muscle weakness (generalized) (M62.81);History of falling (Z91.81);Pain Pain - part of body: (neck)                Time: 4967-5916 OT Time Calculation (min): 36 min Charges:  OT General Charges $OT Visit: 1 Visit OT Evaluation $OT Eval Moderate Complexity: 1 Mod OT Treatments $Self Care/Home Management : 8-22 mins  Golden Circle, OTR/L Acute [employer] Pager [phone] Office [phone]     Almon Register 04/07/2018, 10:31 AM

## 2018-04-07 NOTE — Evaluation (Signed)
Physical Therapy Evaluation Patient Details Name: [name] Duffy MRN: [id number] DOB: 13-Jul-1929 Today's Date: 04/07/2018   History of Present Illness  Patient is a 85 y/o male who presents with RLE weakness and 2 witnessed falls at home. 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 PMH includes HTN, glaucoma, prostate ca, anxiety.   Clinical Impression  Patient presents with right sided weakness, impaired balance, neck pain and impaired mobility s/p above. Pt lives in [place] and helps care for wife who has paraplegia. Reports 3 recent falls. Pt is independent PTA and uses SPC for ambulation. Tolerated transfers and gait training with Min-Mod A for balance/safety. Does much better with RW however still with dragging of RLE especially with fatigued. Would benefit from CIR to maximize independence and mobility prior to return home. Will follow acutely.     Follow Up Recommendations CIR    Equipment Recommendations  Rolling walker with 5" wheels    Recommendations for Other Services Rehab consult     Precautions / Restrictions Precautions Precautions: Fall Precaution Comments: watch BP Restrictions Weight Bearing Restrictions: No      Mobility  Bed Mobility Overal bed mobility: Needs Assistance Bed Mobility: Supine to Sit     Supine to sit: Supervision;HOB elevated     General bed mobility comments: Use of rail and increased time. No dizziness.   Transfers Overall transfer level: Needs assistance Equipment used: Straight cane Transfers: Sit to/from Stand Sit to Stand: Mod assist         General transfer comment: Assist to power to standing with use of momentum and multiple attempts with pt reaching for counter for support.   Ambulation/Gait Ambulation/Gait assistance: Min assist Gait Distance (Feet): 120 Feet Assistive device: Rolling walker (2 wheeled) Gait Pattern/deviations: Step-through pattern;Decreased stance time - right;Narrow base of  support;Trunk flexed Gait velocity: decreased   General Gait Details: Slow, unsteady gait dragging RLE esp when fatigued. Cues for upright. Cues for RW management/proximity as pt tends to walk outside RW. 1 standing rest break. HR up to 119 bpm.   Stairs            Wheelchair Mobility    Modified Rankin (Stroke Patients Only) Modified Rankin (Stroke Patients Only) Pre-Morbid Rankin Score: Slight disability Modified Rankin: Moderately severe disability     Balance Overall balance assessment: Needs assistance;History of Falls Sitting-balance support: Feet supported;No upper extremity supported Sitting balance-Leahy Scale: Fair     Standing balance support: During functional activity Standing balance-Leahy Scale: Poor Standing balance comment: Requires BUE support in standing.                             Pertinent Vitals/Pain Pain Assessment: 0-10 Pain Score: 9  Pain Location: neck Pain Descriptors / Indicators: Sore Pain Intervention(s): Monitored during session    Home Living Family/patient expects to be discharged to:: Assisted living([facility]) Living Arrangements: (wife is paraplegic and w/c bound) Available Help at Discharge: Personal care attendant;Family;Available PRN/intermittently Type of Home: Apartment Home Access: Elevator     Home Layout: One level Home Equipment: Cane - single point;Wheelchair - manual      Prior Function Level of Independence: Independent with assistive device(s)         Comments: Uses SPC for ambulation. Fell backwards into tub 1st of the year. Has PCA come in to help care for wife everyday for a few hours. Wife is a paraplegic. Pt helps with ADls (changes  briefs, helps with transfers). Drives. Does some cooking.      Hand Dominance   Dominant Hand: Right    Extremity/Trunk Assessment   Upper Extremity Assessment Upper Extremity Assessment: Defer to OT evaluation    Lower Extremity  Assessment Lower Extremity Assessment: RLE deficits/detail RLE Deficits / Details: Grossly ~4/5 throughout RLE Sensation: WNL RLE Coordination: WNL    Cervical / Trunk Assessment Cervical / Trunk Assessment: Kyphotic  Communication   Communication: HOH  Cognition Arousal/Alertness: Awake/alert Behavior During Therapy: WFL for tasks assessed/performed Overall Cognitive Status: Within Functional Limits for tasks assessed                                 General Comments: for basic mobility tasks. Slow to respond at times. Does not know exact date but knows year and month.       General Comments General comments (skin integrity, edema, etc.): BP 156/108 pre activity, BP 149/112 post activity.     Exercises     Assessment/Plan    PT Assessment Patient needs continued PT services  PT Problem List Decreased strength;Decreased balance;Pain;Cardiopulmonary status limiting activity;Decreased mobility;Decreased activity tolerance       PT Treatment Interventions Functional mobility training;Balance training;Patient/family education;Gait training;Therapeutic activities;Neuromuscular re-education;Therapeutic exercise;DME instruction    PT Goals (Current goals can be found in the Care Plan section)  Acute Rehab PT Goals Patient Stated Goal: to get back home to my wife PT Goal Formulation: With patient Time For Goal Achievement: 04/21/18 Potential to Achieve Goals: Good    Frequency Min 4X/week   Barriers to discharge Decreased caregiver support      Co-evaluation               AM-PAC PT "6 Clicks" Mobility  Outcome Measure Help needed turning from your back to your side while in a flat bed without using bedrails?: A Little Help needed moving from lying on your back to sitting on the side of a flat bed without using bedrails?: A Little Help needed moving to and from a bed to a chair (including a wheelchair)?: A Little Help needed standing up from a chair  using your arms (e.g., wheelchair or bedside chair)?: A Little Help needed to walk in hospital room?: A Little Help needed climbing 3-5 steps with a railing? : A Lot 6 Click Score: 17    End of Session Equipment Utilized During Treatment: Gait belt Activity Tolerance: Patient tolerated treatment well Patient left: in chair;with call bell/phone within reach;Other (comment)(Ot present in room) Nurse Communication: Mobility status PT Visit Diagnosis: Hemiplegia and hemiparesis;Difficulty in walking, not elsewhere classified (R26.2);Muscle weakness (generalized) (M62.81);Unsteadiness on feet (R26.81) Hemiplegia - Right/Left: Right Hemiplegia - dominant/non-dominant: Dominant Hemiplegia - caused by: Cerebral infarction    Time: 0726-0800 PT Time Calculation (min) (ACUTE ONLY): 34 min   Charges:   PT Evaluation $PT Eval Moderate Complexity: 1 Mod PT Treatments $Gait Training: 8-22 mins        Wray Kearns, PT, DPT Acute Rehabilitation Services Pager [phone] Office [place] 04/07/2018, 8:15 AM

## 2018-04-07 NOTE — Progress Notes (Signed)
  Echocardiogram 2D Echocardiogram has been performed.  Johny Chess 04/07/2018, 9:54 AM

## 2018-04-07 NOTE — Progress Notes (Signed)
STROKE TEAM PROGRESS NOTE   HISTORY OF PRESENT ILLNESS (per record) [name] Duffy is a 85 y.o. male with a PMHx of HTN, HLD and prostate CA who presented to [facility] today with right leg weakness. Apparently he went to bed fine, but when he woke up to go to the bathroom he noticed weakness of his right leg.Marland Kitchen He resides in an independent living home so he pressed his button to notify staff who responded and called EMS.  A CT brain was at [facility] was negative for hemorrhage. MRI revealed an acute small vessel white matter infarct in the left centrum semiovale near the left perirolandic cortex. It was determined that he would benefit from transfer and further work up at [facility].   LKW:  tpa given?: no, no outside of the window  Premorbid modified Rankin scale (mRS): 3   SUBJECTIVE (INTERVAL HISTORY) His nephew is at bedside. Reviewed images. Patient is hard of hearing. He agrees to inpatient rehab.     OBJECTIVE Vitals:   04/06/18 2015 04/06/18 2340 04/07/18 0412 04/07/18 0833  BP: (!) 139/92 (!) 132/94 (!) 131/92 (!) 148/104  Pulse: 95 83 81 71  Resp: 18 20 (!) 23 (!) 24  Temp:  98.2 F (36.8 C) 98.1 F (36.7 C) 97.6 F (36.4 C)  TempSrc:  Oral Axillary Oral  SpO2: 97% 98% 100% 100%  Weight:   71.2 kg     CBC:  Recent Labs  Lab 04/06/18 0840 04/06/18 1806 04/07/18 0715  WBC 6.8 7.8 6.9  NEUTROABS 4.2  --   --   HGB 14.4 15.3 15.8  HCT 45.4 47.3 49.2  MCV 90.3 85.8 85.9  PLT 193 185 588    Basic Metabolic Panel:  Recent Labs  Lab 04/06/18 0840 04/06/18 1806 04/07/18 0715  NA 142  --  139  K 3.9  --  4.1  CL 106  --  105  CO2 27  --  23  GLUCOSE 97  --  91  BUN 19  --  13  CREATININE 1.05 1.01 0.95  CALCIUM 10.1  --  10.2    Lipid Panel:     Component Value Date/Time   CHOL 160 04/07/2018 0715   TRIG 122 04/07/2018 0715   HDL 47 04/07/2018 0715   CHOLHDL 3.4 04/07/2018 0715   VLDL 24 04/07/2018 0715   LDLCALC 89 04/07/2018 0715   HgbA1c:  Lab  Results  Component Value Date   HGBA1C 5.5 04/06/2018   Urine Drug Screen: No results found for: LABOPIA, COCAINSCRNUR, LABBENZ, AMPHETMU, THCU, LABBARB  Alcohol Level No results found for: [facility]  IMAGING  Mr Brain Wo Contrast 04/06/2018 IMPRESSION:  1. Acute small vessel white matter infarct in the left centrum semiovale near the left perirolandic cortex. No hemorrhage or mass effect.  2. Otherwise stable chronic ischemic disease in the brain since May 2019.  3. Chronic intracranial artery dolichoectasia.   Ct Head Wo Contrast 04/06/2018 IMPRESSION:  1. No acute intracranial abnormality  2. Negative for cervical spine fracture.  Multilevel spondylosis.  3. Fusiform enlargement distal right vertebral artery due to atherosclerotic disease unchanged from prior studies.    Dg Chest 2 View 04/06/2018 IMPRESSION:  Large paraesophageal hernia. Left base atelectasis. Elevation of the left hemidiaphragm, stable. No frank airspace consolidation. Stable cardiac silhouette. There is aortic atherosclerosis. Age uncertain wedge fractures in the lower thoracic region. Aortic Atherosclerosis (ICD10-I70.0).   Dg Lumbar Spine Complete 04/06/2018 IMPRESSION:  Scoliosis with multilevel arthropathy. No acute  fracture or spondylolisthesis. There is aortic atherosclerosis. Aortic Atherosclerosis (ICD10-I70.0).   Ct Cervical Spine Wo Contrast 04/06/2018 IMPRESSION:  1. No acute intracranial abnormality  2. Negative for cervical spine fracture.  Multilevel spondylosis.  3. Fusiform enlargement distal right vertebral artery due to atherosclerotic disease unchanged from prior studies.    Mr Cervical Spine Wo Contrast  04/06/2018 IMPRESSION:  Negative for fracture.  Negative for spinal stenosis Degenerative changes as above.    Mr Lumbar Spine Wo Contrast 04/06/2018 IMPRESSION:  Negative for fracture Scoliosis and multilevel degenerative change, stable from the prior MRI.    Dg Hip Unilat W Or Wo  Pelvis 2-3 Views Right 04/06/2018 IMPRESSION:  1. No fracture, dislocation or significant joint abnormality.   Vas US Carotid 04/07/2018 Summary:  Right Carotid: Velocities in the right ICA are consistent with a 1-39% stenosis.                  Left Carotid: There is no evidence of stenosis in the left ICA.                Vertebrals:  Bilateral vertebral arteries demonstrate antegrade flow.  Subclavians: Normal flow hemodynamics were seen in bilateral subclavian arteries.    Transthoracic Echocardiogram  04/07/2018 Impressions: - LVEF 60-65%, mild LVH, normal wall motion, grade 1 DD, normal LV   filling pressure, probably moderate LAE - ?LA mass versus more   likely artifact - consider TEE to evaluate further given the   study indication of stroke, trivial MR and TR, RVSP 27 mmHg,   normal IVC.    PHYSICAL EXAM Blood pressure (!) 148/104, pulse 71, temperature 97.6 F (36.4 C), temperature source Oral, resp. rate (!) 24, weight 71.2 kg, SpO2 100 %.    Exam: NAD, pleasant                  Speech:    Speech is normal; fluent and spontaneous with normal comprehension.  Cognition:    The patient is oriented to person, place, and time;     recent and remote memory impaired,    language fluent;    Cranial Nerves:    The pupils are equal, round, and reactive to light.Trigeminal sensation is intact and the muscles of mastication are normal. The face is symmetric. The palate elevates in the midline. Hearing impaired. Voice is normal. Shoulder shrug is normal. The tongue has normal motion without fasciculations.   Coordination:  No dysmetria noted  Motor Observation:     no involuntary movements noted. Tone:    Normal muscle tone.     Strength:    Strength is antigravity in the upper and lower limbs. Mild weakness in right LE.       Sensation: intact to LT                ASSESSMENT/PLAN [name] Duffy is a 85 y.o. male with history of prostate CA,  glaucoma, Htn and Hld presenting with RLE weakness. He did not receive IV t-PA due to late presentation.  Stroke:  left centrum semiovale - small vessel disease  Resultant  Right mild LE weakness improving  CT head - No acute intracranial abnormality   MRI head -  Acute small vessel white matter infarct in the left centrum semiovale near the left perirolandic cortex.  MRA head - not performed  CTA H&N - not performed  Carotid Doppler  - unremarkable  2D Echo  - EF 60 - 65%.  Possible LA  mass on Echo - TEE recommended  LDL - 89  HgbA1c - 5.5  UDS - not performed  VTE prophylaxis - Lovenox  Diet  - Heart healthy with thin liquids.  aspirin 81 mg daily prior to admission, now on aspirin 325 mg daily and clopidogrel 75 mg daily. Discharge on ASA 81mg  and Plavix 75mg  for 3 weeks then Plavix alone.  Patient counseled to be compliant with his antithrombotic medications  Ongoing aggressive stroke risk factor management  Therapy recommendations:  CIR recommended  Disposition:  Pending  Hypertension  Diastolic BP somewhat high at times . Permissive hypertension (OK if < 220/120) but gradually normalize in 5-7 days . Long-term BP goal normotensive  Hyperlipidemia  Lipid lowering medication PTA:  Zocor 20 mg daily  LDL 89, goal < 70  Current lipid lowering medication: Zocor increased to 40 mg daily  Continue statin at discharge    Other Stroke Risk Factors  Advanced age   Other Active Problems  Possible LA mass on Echo - TEE recommended  Scoliosis with multilevel arthropathy   Hospital day # 1  Discharge on ASA 81mg  and Plavix 75mg  for 3 weeks then Plavix alone. Stroke team will sign off at this time.   Personally examined patient and images, and have participated in and made any corrections needed to history, physical, neuro exam,assessment and plan as stated above.  I have personally obtained the history, evaluated lab date, reviewed imaging studies and  agree with radiology interpretations.    Sarina Ill, MD Stroke Neurology   A total of 25 minutes was spent for the care of this patient, spent on counseling patient and family on different diagnostic and therapeutic options, counseling and coordination of care, riskd ans benefits of management, compliance, or risk factor reduction and education.   To contact Stroke Continuity provider, please refer to [email or web address] After hours, contact General Neurology

## 2018-04-07 NOTE — Progress Notes (Signed)
PROGRESS NOTE    [name] Duffy  GYI:[id number] DOB: 31-May-1929 DOA: 04/06/2018 PCP: Burnard Bunting, MD   Brief Narrative:  HPI per [place] Schimke on 04/06/2018 [name] Duffy is a 85 y.o. male with past medical history of hypertension, falls, lipidemia and arthritis, presented to the hospital with complaints of fall and right leg weakness.  At baseline, patient uses a cane for ambulation and is at independent living facility.  Patient's nephew at bedside was available for more history taking.  Patient has had almost 2-3 falls over the last 6 months or so.  Patient had fall yesterday and this morning episode this morning was very different with inability to weight-bear on the right side.  Patient denied hurting his head or neck but was unable to get up by himself.  EMS was then called in and patient was brought into the hospital.  Patient denies any slurred speech difficulty swallowing dizziness lightheadedness chest pain palpitation prior to this event.  He denied focal arm weakness as well.  Denies history of CVA in the past.  Patient has some urinary frequency but does not have dysuria or burning micturition.  Patient denies any fever, chills or rigors.  Denies any changes in his medication except for Xanax which he takes before he goes to bed but this is not a new medication.  Patient denied any nausea, vomiting or diarrhea.  There is no recent changes in his bowel habits.  He currently lives with his wife at a independent living facility and has somebody to help his wife at his facility.  ED Course: In the ED, patient underwent MRI scan of the head which showed left centrum semiovale stroke.  Patient was then considered for admission to the hospital.  Neurology was notified from the ED (Dr Cheral Marker) who recommended transfer to [facility] and [facility].  **Undergoing Stroke Workup and Neurology following and recommending discharging on ASA 81 mg and Plavix 75 mg for 3 weeks and then just  Plavix Alone. ECHO showed ? LA mass or artifact but will pursue TEE. PT/OT recommending CIR and have initiated the process.  Assessment & Plan:   Principal Problem:   Acute CVA (cerebrovascular accident) ([place]) Active Problems:   Anxiety state   H/O malignant neoplasm of prostate   CVA (cerebral vascular accident) ([place])  Acute CVA in the left centrum semiovale likely secondary to small vessel disease with resultant mild lower extremity weakness -Admit to telemetry -Head CT was done without any intracranial abnormalities -MRI of the head showed no acute small vessel white matter infarct in the left centrum semiovale near the left perirololandic cortex -Carotid Dopplers were done and preliminary report showed Right Carotid: Velocities in the right ICA are consistent with a 1-39% stenosis.Left Carotid: There is no evidence of stenosis in the left ICA. Vertebrals:  Bilateral vertebral arteries demonstrate antegrade flow. Subclavians: Normal flow hemodynamics were seen in bilateral subclavian arteries. -Echocardiogram showed LVEF 60-65%, mild LVH, normal wall motion, grade 1 DD, normal LV filling pressure, probably moderate LAE - ?LA mass versus more likely artifact - consider TEE to evaluate further given the study indication of stroke, trivial MR and TR, RVSP 27 mmHg, normal IVC. -We will email Cardiology Master to set up TEE -Continue with Neurochecks per Protocol -PT/OT/SLP evaluations; PT/OT recommending CIR  -Fasting lipid panel done and showed an LDL as below -Hemoglobin A1c was also done and was 5.5 -Neurology place the patient on aspirin 81 mg and Plavix 75 mg for 3 weeks  and then just Plavix alone -Patient's statin was increased however was changed due to patient being on amlodipine -Continue with atorvastatin 20 mg currently -Currently awaiting CIR placement -Further Care per Stroke Team   Anxiety and Insomnia.   -Patient takes Xanax.  I do not want to discontinue this at this time  but the patient was explained the risk of falls with it.  Hypertension -Will allow permissive hypertension but using modified permissive hypertension protocol up to 180/100 -Discontinue his amlodipine as well as his losartan currently as we will allow for permissive hypertension and is okay if his blood pressure is less than 180/100 but then gradually normalize in 5 to 7 days with long-term blood pressure goal normotensive -Continue monitor blood pressures very carefully  Hyperlipidemia  -Lipid Panel showed a level 160, HDL 47, LDL of 89, triglycerides of 122, and VLDL of 24 -Simvastatin dose increased to 40 mg po qHS but changed to Atorvastatin 20 mg po Daily by Pharmacy due to Increased Risk of Rhabdomyolysis in the setting of Amlodipine  Generalized weakness, Recurrent Falls.   -Will get physical therapy, occupational therapy evaluation. -He was counseled against Xanax which could contribute to fall. -PT/OT recommending CIR  History of Glaucoma  -On Latanoprost 6.301% Opthamlic Solution 1 drop Right Eye Daily qHS  DVT prophylaxis: Enoxaparin 40 mg subcu every 24 Code Status: FULL CODE with the stipulation of not wanting to be on the Ventilator for long term.  Family Communication: Discussed with his Nephew over the phone Disposition Plan:   Consultants:   Neurology   Procedures:  ECHOCARDIOGRAM ------------------------------------------------------------------- Study Conclusions  - Left ventricle: The cavity size was normal. Wall thickness was   increased in a pattern of mild LVH. Systolic function was normal.   The estimated ejection fraction was in the range of 60% to 65%.   Wall motion was normal; there were no regional wall motion   abnormalities. Doppler parameters are consistent with abnormal   left ventricular relaxation (grade 1 diastolic dysfunction). The   E/e&' ratio is <8, suggesting normal LV filling pressure. - Mitral valve: Mildly thickened leaflets .  There was trivial   regurgitation. - Left atrium: Appears moderately dilated - ill defined opacitiy   seen in multiple views in the LA, suspect artifact or less likely   thrombus/mass, but consider TEE to further evaluate in the   setting of stroke. - Tricuspid valve: There was trivial regurgitation. - Pulmonary arteries: PA peak pressure: 27 mm Hg (S). - Inferior vena cava: The vessel was normal in size. The   respirophasic diameter changes were in the normal range (>= 50%),   consistent with normal central venous pressure.  Impressions:  - LVEF 60-65%, mild LVH, normal wall motion, grade 1 DD, normal LV   filling pressure, probably moderate LAE - ?LA mass versus more   likely artifact - consider TEE to evaluate further given the   study indication of stroke, trivial MR and TR, RVSP 27 mmHg,   normal IVC.  LE VENOUS DUPLEX Preliminary results Negative for DVT. Incidental findings arterail signals are triphasic throughout   Antimicrobials:  Anti-infectives (From admission, onward)   None     Subjective: Seen and examined at bedside was doing fairly well.  Still was surprised about having a stroke.  Complains of some mild leg weakness.  Also complained of some cramping and pain in the back of his calf on the left side.  No other concerns or complaints at this time and hoping  to go to rehab for a short-term.  No other concerns or complaints at this time.  I discussed the CODE STATUS with the patient and patient wishes to be full code at this time but does not want to be on prolonged ventilation and be hooked on ventilator if that is his only means of life support.  Objective: Vitals:   04/07/18 0412 04/07/18 0833 04/07/18 1240 04/07/18 1616  BP: (!) 131/92 (!) 148/104 140/80 135/90  Pulse: 81 71 96 84  Resp: (!) 23 (!) 24 (!) 24 (!) 22  Temp: 98.1 F (36.7 C) 97.6 F (36.4 C) 97.9 F (36.6 C) 97.9 F (36.6 C)  TempSrc: Axillary Oral Oral Oral  SpO2: 100% 100% 95% 99%    Weight: 71.2 kg       Intake/Output Summary (Last 24 hours) at 04/07/2018 1641 Last data filed at 04/07/2018 1100 Gross per 24 hour  Intake 600 ml  Output -  Net 600 ml   Filed Weights   04/07/18 0412  Weight: 71.2 kg   Examination: Physical Exam:  Constitutional: WN/WD elderly Caucasian male NAD and appears calm and comfortable Eyes: Lids and conjunctivae normal, sclerae anicteric  ENMT: External Ears, Nose appear normal.  Patient is very hard of hearing. Mucous membranes are moist.  Neck: Appears normal, supple, no cervical masses, normal ROM, no appreciable thyromegaly, no JVD Respiratory: Diminished to auscultation bilaterally, no wheezing, rales, rhonchi or crackles. Normal respiratory effort and patient is not tachypenic. No accessory muscle use.  Cardiovascular: RRR, no murmurs / rubs / gallops. S1 and S2 auscultated.  Trace extremity edema. Abdomen: Soft, non-tender, non-distended. No masses palpated. No appreciable hepatosplenomegaly. Bowel sounds positive x4.  GU: Deferred. Musculoskeletal: No clubbing / cyanosis of digits/nails. No joint deformity upper and lower extremities.  Skin: No rashes, lesions, ulcers on a limited skin evaluation. No induration; Warm and dry.  Neurologic: CN 2-12 grossly intact with no focal deficits.  Romberg sign and cerebellar reflexes not assessed.  Psychiatric: Normal judgment and insight. Alert and oriented x 3. Normal mood and appropriate affect.   Data Reviewed: I have personally reviewed following labs and imaging studies  CBC: Recent Labs  Lab 04/06/18 0840 04/06/18 1806 04/07/18 0715  WBC 6.8 7.8 6.9  NEUTROABS 4.2  --   --   HGB 14.4 15.3 15.8  HCT 45.4 47.3 49.2  MCV 90.3 85.8 85.9  PLT 193 185 546   Basic Metabolic Panel: Recent Labs  Lab 04/06/18 0840 04/06/18 1806 04/07/18 0715 04/07/18 0900  NA 142  --  139  --   K 3.9  --  4.1  --   CL 106  --  105  --   CO2 27  --  23  --   GLUCOSE 97  --  91  --   BUN 19   --  13  --   CREATININE 1.05 1.01 0.95  --   CALCIUM 10.1  --  10.2  --   MG  --   --   --  2.2  PHOS  --   --   --  2.8   GFR: Estimated Creatinine Clearance: 54 mL/min (by C-G formula based on SCr of 0.95 mg/dL). Liver Function Tests: Recent Labs  Lab 04/06/18 0840 04/07/18 0715  AST 22 25  ALT 22 21  ALKPHOS 53 61  BILITOT 1.2 1.1  PROT 6.7 7.1  ALBUMIN 4.1 3.9   No results for input(s): LIPASE, AMYLASE in the last 168 hours.  No results for input(s): AMMONIA in the last 168 hours. Coagulation Profile: No results for input(s): INR, PROTIME in the last 168 hours. Cardiac Enzymes: Recent Labs  Lab 04/06/18 0840  TROPONINI <0.03   BNP (last 3 results) No results for input(s): PROBNP in the last 8760 hours. HbA1C: Recent Labs    04/06/18 1806  HGBA1C 5.5   CBG: No results for input(s): GLUCAP in the last 168 hours. Lipid Profile: Recent Labs    04/07/18 0715  CHOL 160  HDL 47  LDLCALC 89  TRIG 122  CHOLHDL 3.4   Thyroid Function Tests: Recent Labs    04/06/18 1806  TSH 1.649   Anemia Panel: No results for input(s): VITAMINB12, FOLATE, FERRITIN, TIBC, IRON, RETICCTPCT in the last 72 hours. Sepsis Labs: No results for input(s): PROCALCITON, LATICACIDVEN in the last 168 hours.  Recent Results (from the past 240 hour(s))  MRSA PCR Screening     Status: None   Collection Time: 04/06/18  7:47 PM  Result Value Ref Range Status   MRSA by PCR NEGATIVE NEGATIVE Final    Comment:        The GeneXpert MRSA Assay (FDA approved for NASAL specimens only), is one component of a comprehensive MRSA colonization surveillance program. It is not intended to diagnose MRSA infection nor to guide or monitor treatment for MRSA infections. Performed at [facility], [place] [address], [place], [place] [address]     Radiology Studies: Dg Chest 2 View  Result Date: 04/06/2018 CLINICAL DATA:  Pain following fall EXAM: CHEST - 2 VIEW COMPARISON:  October 15, 2017  FINDINGS: There is stable elevation of the left hemidiaphragm. There is left base atelectasis. There is no frank edema or consolidation. Heart is upper normal in size with pulmonary vascularity normal. There is a large paraesophageal hernia. There is aortic atherosclerosis. There is no appreciable pneumothorax. There is anterior wedging lower thoracic vertebral bodies, age uncertain. IMPRESSION: Large paraesophageal hernia. Left base atelectasis. Elevation of the left hemidiaphragm, stable. No frank airspace consolidation. Stable cardiac silhouette. There is aortic atherosclerosis. Age uncertain wedge fractures in the lower thoracic region. Aortic Atherosclerosis (ICD10-I70.0). Electronically Signed   By: Lowella Grip III M.D.   On: 04/06/2018 08:13   Dg Lumbar Spine Complete  Result Date: 04/06/2018 CLINICAL DATA:  Pain following fall EXAM: LUMBAR SPINE - COMPLETE 4+ VIEW COMPARISON:  Lumbar radiographs October 19, 2003 and lumbar MR June 26 2017 FINDINGS: Frontal, lateral, spot lumbosacral lateral, and bilateral oblique views were obtained. There are 5 non-rib-bearing lumbar type vertebral bodies. There is thoracic dextroscoliosis with rotatory component. No acute fracture or spondylolisthesis is evident in the lumbar region. There is disc space narrowing at all levels in the lumbar spine. Vacuum phenomenon is noted at L2-3. There is facet osteoarthritic change at all levels bilaterally. There is aortic atherosclerosis. IMPRESSION: Scoliosis with multilevel arthropathy. No acute fracture or spondylolisthesis. There is aortic atherosclerosis. Aortic Atherosclerosis (ICD10-I70.0). Electronically Signed   By: Lowella Grip III M.D.   On: 04/06/2018 08:15   Ct Head Wo Contrast  Result Date: 04/06/2018 CLINICAL DATA:  Fall. EXAM: CT HEAD WITHOUT CONTRAST CT CERVICAL SPINE WITHOUT CONTRAST TECHNIQUE: Multidetector CT imaging of the head and cervical spine was performed following the standard protocol  without intravenous contrast. Multiplanar CT image reconstructions of the cervical spine were also generated. COMPARISON:  CT head 10/15/2017 FINDINGS: CT HEAD FINDINGS Brain: Mild atrophy. Mild chronic microvascular ischemic change in the white matter. Chronic infarcts in  the cerebellum bilaterally unchanged. Negative for acute infarct, hemorrhage, or mass. Vascular: Atherosclerotic disease. Negative for hyperdense vessel. Fusiform enlargement distal right vertebral artery unchanged. Skull: Negative for fracture Sinuses/Orbits: Paranasal sinuses clear. Bilateral cataract surgery. Other: None CT CERVICAL SPINE FINDINGS Alignment: Mild anterolisthesis C3-4.  Mild dextroscoliosis. Skull base and vertebrae: Negative for fracture Soft tissues and spinal canal: Negative for soft tissue mass. Disc levels: Disc degeneration and spurring throughout the cervical spine. Diffuse facet degeneration left greater than right throughout the cervical spine. Upper chest: Negative Other: None IMPRESSION: 1. No acute intracranial abnormality 2. Negative for cervical spine fracture.  Multilevel spondylosis. 3. Fusiform enlargement distal right vertebral artery due to atherosclerotic disease unchanged from prior studies. Electronically Signed   By: Franchot Gallo M.D.   On: 04/06/2018 08:34   Ct Cervical Spine Wo Contrast  Result Date: 04/06/2018 CLINICAL DATA:  Fall. EXAM: CT HEAD WITHOUT CONTRAST CT CERVICAL SPINE WITHOUT CONTRAST TECHNIQUE: Multidetector CT imaging of the head and cervical spine was performed following the standard protocol without intravenous contrast. Multiplanar CT image reconstructions of the cervical spine were also generated. COMPARISON:  CT head 10/15/2017 FINDINGS: CT HEAD FINDINGS Brain: Mild atrophy. Mild chronic microvascular ischemic change in the white matter. Chronic infarcts in the cerebellum bilaterally unchanged. Negative for acute infarct, hemorrhage, or mass. Vascular: Atherosclerotic disease.  Negative for hyperdense vessel. Fusiform enlargement distal right vertebral artery unchanged. Skull: Negative for fracture Sinuses/Orbits: Paranasal sinuses clear. Bilateral cataract surgery. Other: None CT CERVICAL SPINE FINDINGS Alignment: Mild anterolisthesis C3-4.  Mild dextroscoliosis. Skull base and vertebrae: Negative for fracture Soft tissues and spinal canal: Negative for soft tissue mass. Disc levels: Disc degeneration and spurring throughout the cervical spine. Diffuse facet degeneration left greater than right throughout the cervical spine. Upper chest: Negative Other: None IMPRESSION: 1. No acute intracranial abnormality 2. Negative for cervical spine fracture.  Multilevel spondylosis. 3. Fusiform enlargement distal right vertebral artery due to atherosclerotic disease unchanged from prior studies. Electronically Signed   By: Franchot Gallo M.D.   On: 04/06/2018 08:34   Mr Brain Wo Contrast  Result Date: 04/06/2018 CLINICAL DATA:  85 year old male with recent falls. Right leg cramps. EXAM: MRI HEAD WITHOUT CONTRAST TECHNIQUE: Multiplanar, multiecho pulse sequences of the brain and surrounding structures were obtained without intravenous contrast. COMPARISON:  Head and cervical spine CT earlier today. Brain MRI 08/01/2017. FINDINGS: Brain: Oval 12 millimeter focus of restricted diffusion in the left posterior centrum semiovale near the perirolandic cortex (series 4, image 28). Minimal T2 hyperintensity. No hemorrhage or mass effect. No other restricted diffusion. Stable cerebral volume. Patchy and widely scattered cerebral white matter T2 and FLAIR hyperintensity outside of the acute finding is stable. There is a small area of chronic cortical encephalomalacia in the left superior frontal gyrus (series 7, image 25). No chronic cerebral blood products. Mild to moderate for age chronic T2 heterogeneity in the deep gray matter nuclei. Chronic bilateral posterior cerebellar infarcts. No midline shift,  mass effect, evidence of mass lesion, ventriculomegaly, extra-axial collection or acute intracranial hemorrhage. Cervicomedullary junction and pituitary are within normal limits. Vascular: Major intracranial vascular flow voids are stable since 2019. Dominant right vertebral artery with moderate fusiform dolichoectasia in the posterior fossa again noted. The left ICA siphon and terminus also are ectatic. Skull and upper cervical spine: Negative visible cervical spine. Visualized bone marrow signal is within normal limits. Sinuses/Orbits: Postoperative changes to both globes since the prior MRI. Stable paranasal sinuses. Other: Mastoids remain clear. Visible internal auditory  structures appear normal. Scalp and face soft tissues appear negative. IMPRESSION: 1. Acute small vessel white matter infarct in the left centrum semiovale near the left perirolandic cortex. No hemorrhage or mass effect. 2. Otherwise stable chronic ischemic disease in the brain since May 2019. 3. Chronic intracranial artery dolichoectasia. Electronically Signed   By: Genevie Ann M.D.   On: 04/06/2018 13:09   Mr Cervical Spine Wo Contrast  Result Date: 04/06/2018 CLINICAL DATA:  Fall.  Neck pain EXAM: MRI CERVICAL SPINE WITHOUT CONTRAST TECHNIQUE: Multiplanar, multisequence MR imaging of the cervical spine was performed. No intravenous contrast was administered. COMPARISON:  CT cervical spine today 04/06/2018 FINDINGS: Alignment: Normal alignment.  Mild dextroscoliosis. Vertebrae: Negative for fracture or bone marrow edema. Cord: Negative for cord compression. Cord signal normal. No epidural hematoma. Posterior Fossa, vertebral arteries, paraspinal tissues: Negative Disc levels: C2-3: Mild facet degeneration on the left.  Negative for stenosis C3-4: Mild disc and mild facet degeneration. Mild foraminal narrowing bilaterally. C4-5: Disc degeneration and spurring. Mild facet degeneration. Negative for stenosis. C5-6: Mild right foraminal narrowing  due to spurring. C6-7: Mild disc degeneration.  Negative for stenosis C7-T1: Mild facet degeneration on the left. No significant stenosis. IMPRESSION: Negative for fracture.  Negative for spinal stenosis Degenerative changes as above. Electronically Signed   By: Franchot Gallo M.D.   On: 04/06/2018 13:30   Mr Lumbar Spine Wo Contrast  Result Date: 04/06/2018 CLINICAL DATA:  Low back pain.  Recent falls.  Right leg cramps EXAM: MRI LUMBAR SPINE WITHOUT CONTRAST TECHNIQUE: Multiplanar, multisequence MR imaging of the lumbar spine was performed. No intravenous contrast was administered. COMPARISON:  MRI lumbar spine 06/26/2017 FINDINGS: Segmentation:  Normal Alignment:  Dextroscoliosis.  Mild retrolisthesis L1-2 and L2-3 Vertebrae:  Negative for fracture or mass lesion. Conus medullaris and cauda equina: Conus extends to the L1-2 level. Conus and cauda equina appear normal. Paraspinal and other soft tissues: Bilateral renal cysts. No retroperitoneal mass or adenopathy Disc levels: T12-L1: Mild disc degeneration L1-2: Disc degeneration and spurring on the left with moderate subarticular stenosis, unchanged. L2-3: Disc degeneration and spurring on the left. Mild subarticular stenosis on the left unchanged. L3-4: Mild disc degeneration and mild spurring left greater than right. Mild facet degeneration. Mild subarticular stenosis on the left L4-5: Disc degeneration with disc bulging and spurring. Mild subarticular stenosis on the right L5-S1: Bilateral facet degeneration. Mild right foraminal narrowing due to spurring, unchanged. IMPRESSION: Negative for fracture Scoliosis and multilevel degenerative change, stable from the prior MRI. Electronically Signed   By: Franchot Gallo M.D.   On: 04/06/2018 13:24   Dg Hip Unilat W Or Wo Pelvis 2-3 Views Right  Result Date: 04/06/2018 CLINICAL DATA:  Per EMS, patient coming from [place] with complaints of two witnessed falls. First fall was  yesterday afternoon. He was bending over to pick something up from the floor and fell. Did not get evaluated after this incident. This morning he was going to use the urinal and had some pain/weakness in the right leg which led to a fall. Patient is currently complaining of pain in the right leg but was able to ambulate to the stretcher with EMS. EXAM: DG HIP (WITH OR WITHOUT PELVIS) 2-3V RIGHT COMPARISON:  None. FINDINGS: No fracture.  No bone lesion. Hip joints, SI joints and symphysis pubis are normally aligned. There are surgical vascular clips in the pelvis consistent with a prior prostatectomy. Soft tissues otherwise unremarkable. IMPRESSION: 1. No fracture, dislocation or significant  joint abnormality. Electronically Signed   By: Lajean Manes M.D.   On: 04/06/2018 09:34   Vas US Carotid  Result Date: 04/07/2018 Carotid Arterial Duplex Study Indications:       CVA. Risk Factors:      Hypertension, hyperlipidemia. Other Factors:     Prostate cancer. Comparison Study:  Exam for comparison 07/03/2017 available Performing Technologist: Toma Copier RVS  Examination Guidelines: A complete evaluation includes B-mode imaging, spectral Doppler, color Doppler, and power Doppler as needed of all accessible portions of each vessel. Bilateral testing is considered an integral part of a complete examination. Limited examinations for reoccurring indications may be performed as noted.  Right Carotid Findings: +----------+--------+--------+--------+------------+--------------------+           PSV cm/sEDV cm/sStenosisDescribe    Comments             +----------+--------+--------+--------+------------+--------------------+ CCA Prox  107     4                           mild intimal changes +----------+--------+--------+--------+------------+--------------------+ CCA Distal93      11                          mild intimal changes  +----------+--------+--------+--------+------------+--------------------+ ICA Prox  58      10                          mild intimal changes +----------+--------+--------+--------+------------+--------------------+ ICA Mid   81      14                          tortuous             +----------+--------+--------+--------+------------+--------------------+ ICA Distal103     19                          tortuous             +----------+--------+--------+--------+------------+--------------------+ ECA       114     1               heterogenousmild plaque          +----------+--------+--------+--------+------------+--------------------+ +----------+--------+-------+--------+-------------------+           PSV cm/sEDV cmsDescribeArm Pressure (mmHG) +----------+--------+-------+--------+-------------------+ [id number]                                        +----------+--------+-------+--------+-------------------+ +---------+--------+--+--------+--+ VertebralPSV cm/s99EDV cm/s13 +---------+--------+--+--------+--+ No significant change noted from previous exam  Left Carotid Findings: +----------+--------+--------+--------+------------+--------------------+           PSV cm/sEDV cm/sStenosisDescribe    Comments             +----------+--------+--------+--------+------------+--------------------+ CCA Prox  85      12                          mild intimal cahnges +----------+--------+--------+--------+------------+--------------------+ CCA Distal86      11                                               +----------+--------+--------+--------+------------+--------------------+ ICA Prox  78      10      1-39%   heterogenousmild plaque          +----------+--------+--------+--------+------------+--------------------+ ICA Mid   83      16                          tortuous              +----------+--------+--------+--------+------------+--------------------+ ICA Distal58      15                          tortuous             +----------+--------+--------+--------+------------+--------------------+ ECA       65      2                                                +----------+--------+--------+--------+------------+--------------------+ +----------+--------+--------+--------+-------------------+ SubclavianPSV cm/sEDV cm/sDescribeArm Pressure (mmHG) +----------+--------+--------+--------+-------------------+           107                                         +----------+--------+--------+--------+-------------------+ +---------+--------+--+--------+-+ VertebralPSV cm/s56EDV cm/s7 +---------+--------+--+--------+-+ No significant change noted from previous exam  Summary: Right Carotid: Velocities in the right ICA are consistent with a 1-39% stenosis.                See technician comments above. Left Carotid: There is no evidence of stenosis in the left ICA. See technican               comment s above. Vertebrals:  Bilateral vertebral arteries demonstrate antegrade flow. Subclavians: Normal flow hemodynamics were seen in bilateral subclavian              arteries. *See table(s) above for measurements and observations.     Preliminary    Vas [place] Lower Extremity Venous (dvt)  Result Date: 04/07/2018  Lower Venous Study Indications: Pain.  Risk Factors: Known back problems. Performing Technologist: Toma Copier RVS  Examination Guidelines: A complete evaluation includes B-mode imaging, spectral Doppler, color Doppler, and power Doppler as needed of all accessible portions of each vessel. Bilateral testing is considered an integral part of a complete examination. Limited examinations for reoccurring indications may be performed as noted.  Right Venous Findings: +---------+---------------+---------+-----------+----------+-------+           CompressibilityPhasicitySpontaneityPropertiesSummary +---------+---------------+---------+-----------+----------+-------+ CFV      Full           Yes      Yes                          +---------+---------------+---------+-----------+----------+-------+ SFJ      Full                                                 +---------+---------------+---------+-----------+----------+-------+ FV Prox  Full           Yes      Yes                          +---------+---------------+---------+-----------+----------+-------+  FV Mid   Full                                                 +---------+---------------+---------+-----------+----------+-------+ FV DistalFull           Yes      Yes                          +---------+---------------+---------+-----------+----------+-------+ PFV      Full           Yes      Yes                          +---------+---------------+---------+-----------+----------+-------+ POP      Full           Yes      Yes                          +---------+---------------+---------+-----------+----------+-------+ PTV      Full                                                 +---------+---------------+---------+-----------+----------+-------+ PERO     Full                                                 +---------+---------------+---------+-----------+----------+-------+ Incidental finding. Arterial signals are triphasic throughout  Left Venous Findings: +---------+---------------+---------+-----------+----------+-------+          CompressibilityPhasicitySpontaneityPropertiesSummary +---------+---------------+---------+-----------+----------+-------+ CFV      Full           Yes      Yes                          +---------+---------------+---------+-----------+----------+-------+ SFJ      Full                                                 +---------+---------------+---------+-----------+----------+-------+ FV Prox   Full           Yes      Yes                          +---------+---------------+---------+-----------+----------+-------+ FV Mid   Full                                                 +---------+---------------+---------+-----------+----------+-------+ FV DistalFull           Yes      Yes                          +---------+---------------+---------+-----------+----------+-------+ PFV      Full  Yes      Yes                          +---------+---------------+---------+-----------+----------+-------+ POP      Full           Yes      Yes                          +---------+---------------+---------+-----------+----------+-------+ PTV      Full                                                 +---------+---------------+---------+-----------+----------+-------+ PERO     Full                                                 +---------+---------------+---------+-----------+----------+-------+ Incidental findings. Arterial signals are triphasic throughout    Summary: Right: There is no evidence of deep vein thrombosis in the lower extremity. No cystic structure found in the popliteal fossa. See technical comment above Left: There is no evidence of deep vein thrombosis in the lower extremity. No cystic structure found in the popliteal fossa. See technical comment above  *See table(s) above for measurements and observations.    Preliminary    Scheduled Meds: . amLODipine  5 mg Oral Daily  . aspirin  325 mg Oral Daily  . atorvastatin  20 mg Oral q1800  . citalopram  20 mg Oral Daily  . clopidogrel  75 mg Oral Daily  . docusate sodium  100 mg Oral BID  . enoxaparin (LOVENOX) injection  40 mg Subcutaneous Q24H  . latanoprost  1 drop Right Eye QHS  . losartan  100 mg Oral Daily  . sodium chloride flush  3 mL Intravenous Q12H   Continuous Infusions: . sodium chloride      LOS: 1 day   Kerney Elbe, DO [facility] PAGER is on AMION  If  7PM-7AM, please contact night-coverage [email or web address] Password [facility] 04/07/2018, 4:41 PM

## 2018-04-07 NOTE — Progress Notes (Signed)
Rehab Admissions Coordinator Note:  Per PT recommendation, this patient was screened by Jhonnie Garner for appropriateness for an Inpatient Acute Rehab Consult.  At this time, we are recommending an Inpatient Rehab consult. AC will contact MD regarding request for consult.   Jhonnie Garner 04/07/2018, 9:22 AM  I can be reached at [phone].

## 2018-04-07 NOTE — Evaluation (Signed)
Speech Language Pathology Evaluation Patient Details Name: [name] Duffy MRN: [id number] DOB: [birth year] Today's Date: 04/07/2018 Time: 0923-3007 SLP Time Calculation (min) (ACUTE ONLY): 24 min  Problem List:  Patient Active Problem List   Diagnosis Date Noted  . Acute CVA (cerebrovascular accident) ([place]) 04/06/2018  . CVA (cerebral vascular accident) ([place]) 04/06/2018  . 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 Open angle with borderline findings, low risk, unspecified eye 10/12/2014  . Primary open angle glaucoma 10/12/2014  . Anxiety state 07/23/2013  . Blood in the urine 01/08/2013  . Genuine stress incontinence, male 12/13/2011  . H/O malignant neoplasm of prostate 01/28/2011   Past Medical History:  Past Medical History:  Diagnosis Date  . Anxiety   . Arthritis    hands  . Blood transfusion without reported diagnosis    pt thinks had blood trnsfusion at prostate ca surgery   . Glaucoma   . High cholesterol   . Hypertension   . Prostate cancer [facility])    Past Surgical History:  Past Surgical History:  Procedure Laterality Date  . COLONOSCOPY    . HERNIA REPAIR    . PROSTATECTOMY     HPI:  Patient is a 85 y/o male who presents with RLE weakness and 2 witnessed falls at home. 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 PMH includes HTN, glaucoma, prostate ca, anxiety.    Assessment / Plan / Recommendation Clinical Impression   Patient presents with mild cognitive impairment with primary deficits in attention, problem solving. Slow processing noted at times. Pt is hard of hearing, which occasionally impacts his processing of instructions in cognitive tasks; he requests clarification when necessary. Pt states he has noticed a decline in his attention/focus in recent years. He cares for his wife, a paraplegic. He assists with ADLs, drives, manages bills and medications, and does some cooking. Today, he scored 19/29 on tasks administered from MOC[facility]-Basic  (26/30 and above is considered WNL). Delayed recall functional. Pt had most difficulty with attention, impacting performance in divergent naming, selective digit naming, and calculations (problem solving/processing also contribute). He had difficulty solving basic finance problems presented verbally. As pt was highly independent prior to CVA, recommend skilled ST to address the above cognitive deficits, in order to maximize independence and quality of life. Would benefit from CIR.    SLP Assessment  SLP Recommendation/Assessment: Patient needs continued Speech Lanaguage Pathology Services SLP Visit Diagnosis: Cognitive communication deficit (R41.841)    Follow Up Recommendations  Inpatient Rehab    Frequency and Duration min 1 x/week  1 week      SLP Evaluation Cognition  Overall Cognitive Status: Impaired/Different from baseline Arousal/Alertness: Awake/alert Orientation Level: Oriented X4 Attention: Selective Selective Attention: Impaired Selective Attention Impairment: Verbal complex Memory: (WFL; immediate recall 5/5, delayed recall 5/5) Awareness: Appears intact Problem Solving: Impaired Problem Solving Impairment: Verbal basic(slow processing; defers to mask difficulties) Executive Function: Reasoning Reasoning: Appears intact Safety/Judgment: Appears intact       Comprehension  Auditory Comprehension Overall Auditory Comprehension: Appears within functional limits for tasks assessed(hard of hearing, wears hearing aid) Visual Recognition/Discrimination Discrimination: Within Function Limits Reading Comprehension Reading Status: Not tested    Expression Expression Primary Mode of Expression: Verbal Verbal Expression Overall Verbal Expression: Appears within functional limits for tasks assessed Naming: (7 fruits in 60 seconds, but began naming vegetables after 30) Written Expression Dominant Hand: Right   Oral / Motor  Oral Motor/Sensory Function Overall Oral  Motor/Sensory Function: Within functional limits Motor Speech  Overall Motor Speech: Appears within functional limits for tasks assessed   [place], [place], [place] Pager: [phone] Office: [phone]  Aliene Altes 04/07/2018, 12:13 PM

## 2018-04-07 NOTE — Evaluation (Signed)
Clinical/Bedside Swallow Evaluation Patient Details  Name: [name] Duffy MRN: [id number] Date of Birth: 04-Jun-1929  Today's Date: 04/07/2018 Time: SLP Start Time (ACUTE ONLY): 53 SLP Stop Time (ACUTE ONLY): 1145 SLP Time Calculation (min) (ACUTE ONLY): 15 min  Past Medical History:  Past Medical History:  Diagnosis Date  . Anxiety   . Arthritis    hands  . Blood transfusion without reported diagnosis    pt thinks had blood trnsfusion at prostate ca surgery   . Glaucoma   . High cholesterol   . Hypertension   . Prostate cancer [facility])    Past Surgical History:  Past Surgical History:  Procedure Laterality Date  . COLONOSCOPY    . HERNIA REPAIR    . PROSTATECTOMY     HPI:  Patient is a 85 y/o male who presents with RLE weakness and 2 witnessed falls at home. 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 PMH includes HTN, glaucoma, prostate ca, anxiety. Passed [facility] swallow screen, RN reports MD requested reassessment.  Assessment / Plan / Recommendation Clinical Impression  Patient presents with oropharyngeal swallow which appears at bedside to be within functional limits with adequate airway protection. SLP assessed pt as he consumed items from his lunch tray (regular, soft, puree solids and thin liquids). Mastication, oral control and clearance adequate; swallow appears timely. No overt signs of aspiration observed despite challenging with consecutive straw sips of thin liquids in excess of 3oz. Recommend regular diet with thin liquids, meds whole with liquid. No further skilled ST needs identified for dysphagia.   SLP Visit Diagnosis: Dysphagia, unspecified (R13.10)    Aspiration Risk  No limitations    Diet Recommendation Regular;Thin liquid   Liquid Administration via: Cup;Straw Medication Administration: Whole meds with liquid Supervision: Patient able to self feed    Other  Recommendations Oral Care Recommendations: Oral care BID   Follow up  Recommendations Inpatient Rehab(no needs for swallow)      Frequency and Duration            Prognosis Prognosis for Safe Diet Advancement: Good      Swallow Study   General Date of Onset: 04/06/18 HPI: Patient is a 85 y/o male who presents with RLE weakness and 2 witnessed falls at home. 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 PMH includes HTN, glaucoma, prostate ca, anxiety.  Type of Study: Bedside Swallow Evaluation Previous Swallow Assessment: passed [facility] swallow screen Diet Prior to this Study: Regular;Thin liquids Temperature Spikes Noted: No Respiratory Status: Room air History of Recent Intubation: No Behavior/Cognition: Alert;Cooperative Oral Cavity Assessment: Within Functional Limits Oral Care Completed by SLP: No Oral Cavity - Dentition: Adequate natural dentition Vision: Functional for self-feeding Self-Feeding Abilities: Able to feed self Patient Positioning: Upright in bed Baseline Vocal Quality: Normal Volitional Cough: Strong Volitional Swallow: Able to elicit    Oral/Motor/Sensory Function Overall Oral Motor/Sensory Function: Within functional limits   Ice Chips Ice chips: Not tested   Thin Liquid Thin Liquid: Within functional limits Presentation: Cup;Straw;Self Fed    Nectar Thick Nectar Thick Liquid: Not tested   Honey Thick Honey Thick Liquid: Not tested   Puree Puree: Within functional limits Presentation: Self Fed;Spoon   Solid     Solid: Within functional limits Presentation: Self Fed(fork, by hand)     Deneise Lever, [place], CCC-SLP Speech-Language Pathologist Acute Rehabilitation Services Pager: [phone] Office: [phone]  Aliene Altes 04/07/2018,12:03 PM

## 2018-04-07 NOTE — Consult Note (Signed)
Physical Medicine and Rehabilitation Consult   Reason for Consult: Stroke with functional deficits.  Referring Physician: Dr. Alfredia Ferguson   HPI: [name] Duffy is a 85 y.o. male with history of HTN, prostate CA, recent falls; who was admitted on 04/06/18 with reports of fall the day prior to admission with difficulty walking and weight bearing on RLE.  History taken from chart review and patient.  CT A head/neck without acute changes or fracture.  MRI brain reviewed, sinus small left-sided infarct.  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  MRI cervical spine/lumbar spine showed scoliosis with multilevel DDD.  Hip X rays negative for fracture. BLE dopplers negative for DVT. 2D echo done revealing mild LVH with EF 60-65% and question of LA mass. TEE recommended for work up. Neurology recommends ASA/Plavix X 3 weeks followed by Plavix alone for stroke due to small vessel. Therapy evaluation done revealing mild cognitive impairment with decreased processing, balance deficits and RLE weakness with fatigue. CIR recommended due to functional decline.   Review of Systems  Constitutional: Negative for chills and fever.  HENT: Positive for hearing loss.   Eyes:       Needs glasses--vision hazy.   Respiratory: Negative for cough and shortness of breath.   Cardiovascular: Negative for chest pain and palpitations.  Gastrointestinal: Negative for heartburn and nausea.  Genitourinary: Positive for frequency.       Nocturia 1-3x depending on intake.   Musculoskeletal: Positive for falls. Negative for myalgias.  Skin: Negative for rash.  Neurological: Positive for focal weakness. Negative for dizziness.  Psychiatric/Behavioral: The patient does not have insomnia.   All other systems reviewed and are negative.     Past Medical History:  Diagnosis Date  . Anxiety   . Arthritis    hands  . Blood transfusion without reported diagnosis    pt thinks had blood  trnsfusion at prostate ca surgery   . Glaucoma   . High cholesterol   . Hypertension   . Prostate cancer [facility])     Past Surgical History:  Procedure Laterality Date  . COLONOSCOPY    . HERNIA REPAIR    . PROSTATECTOMY      Family History  Problem Relation Age of Onset  . Hypertension Other   . Colon cancer Neg Hx   . Colon polyps Neg Hx   . Esophageal cancer Neg Hx   . Rectal cancer Neg Hx   . Stomach cancer Neg Hx     Social History:  Married. 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 He lives a [place]-- [place].  Wife is paraplegic--needs assistance for transfers but able to perform meal prep from chair. Has a nephew who is checking in on her. He  reports that he has never smoked. He has never used smokeless tobacco. He reports that he does not drink alcohol or use drugs.    Allergies  Allergen Reactions  . Rofecoxib Swelling    REACTION: swelling- Vioxx name brand     Facility-Administered Medications Prior to Admission  Medication Dose Route Frequency Provider Last Rate Last Dose  . 0.9 %  sodium chloride infusion  500 mL Intravenous Continuous Armbruster, Carlota Raspberry, MD       Medications Prior to Admission  Medication Sig Dispense Refill  . ALPRAZolam (XANAX) 0.5 MG tablet Take 0.5 mg by mouth at bedtime.     Marland Kitchen aspirin EC 81 MG tablet Take 81 mg by mouth at bedtime.    Marland Kitchen  latanoprost (XALATAN) 0.005 % ophthalmic solution Place 1 drop into the right eye at bedtime.     Marland Kitchen losartan (COZAAR) 100 MG tablet Take 100 mg by mouth.    . simvastatin (ZOCOR) 20 MG tablet Take 20 mg by mouth.    . lidocaine (LIDODERM) 5 % Place 1 patch onto the skin daily. Remove & Discard patch within 12 hours or as directed by MD (Patient not taking: Reported on 04/06/2018) 30 patch 0  . Probiotic Product (ALIGN PO) Take 1 capsule by mouth daily.      Home: Home Living Family/patient expects to be discharged to:: Other (Comment)([facility]) Living Arrangements:  Spouse/significant other Available Help at Discharge: Personal care attendant, Family, Available PRN/intermittently Type of Home: Apartment Home Access: Elevator Home Layout: One level Bathroom Shower/Tub: Tub/shower unit, Architectural technologist: Handicapped height Home Equipment: Radio producer - single point, Wheelchair - manual, Grab bars - toilet, Grab bars - tub/shower  Lives With: Spouse  Functional History: Prior Function Level of Independence: Independent with assistive device(s) Comments: Uses SPC for ambulation. Fell backwards into tub 1st of the year. Has PCA come in to help care for wife everyday for a few hours. Wife is a paraplegic. Pt helps with ADls (changes briefs, helps with transfers). Drives. Does some cooking.  Functional Status:  Mobility: Bed Mobility Overal bed mobility: Needs Assistance Bed Mobility: Supine to Sit Supine to sit: Supervision, HOB elevated General bed mobility comments: Pt finishing up with PT when I entered so had pt sit down in chair to rest  Transfers Overall transfer level: Needs assistance Equipment used: Rolling walker (2 wheeled) Transfers: Sit to/from Stand Sit to Stand: Min assist General transfer comment: increased time and effort to stand Ambulation/Gait Ambulation/Gait assistance: Min assist Gait Distance (Feet): 120 Feet Assistive device: Rolling walker (2 wheeled) Gait Pattern/deviations: Step-through pattern, Decreased stance time - right, Narrow base of support, Trunk flexed General Gait Details: Slow, unsteady gait dragging RLE esp when fatigued. Cues for upright. Cues for RW management/proximity as pt tends to walk outside RW. 1 standing rest break. HR up to 119 bpm.  Gait velocity: decreased    ADL: ADL Overall ADL's : Needs assistance/impaired Eating/Feeding: Independent, Sitting Grooming: Minimal assistance, Standing Grooming Details (indicate cue type and reason): increased time and effort to stand Upper Body Bathing:  Supervision/ safety, Set up, Sitting Lower Body Bathing: Minimal assistance, Sit to/from stand Lower Body Bathing Details (indicate cue type and reason): increased time and effort to stand Upper Body Dressing : Set up, Supervision/safety, Sitting Lower Body Dressing: Minimal assistance, Sit to/from stand Lower Body Dressing Details (indicate cue type and reason): increased time and effort to stand Toilet Transfer: Minimal assistance, Ambulation, BSC Toilet Transfer Details (indicate cue type and reason): over toilet Toileting- Clothing Manipulation and Hygiene: Minimal assistance Toileting - Clothing Manipulation Details (indicate cue type and reason): increased time and effort to stand  Cognition: Cognition Overall Cognitive Status: Impaired/Different from baseline Arousal/Alertness: Awake/alert Orientation Level: Oriented to person, Oriented to place, Oriented to time, Oriented to situation Attention: Selective Selective Attention: Impaired Selective Attention Impairment: Verbal complex Memory: (WFL; immediate recall 5/5, delayed recall 5/5) Awareness: Appears intact Problem Solving: Impaired Problem Solving Impairment: Verbal basic(slow processing; defers to mask difficulties) Executive Function: Reasoning Reasoning: Appears intact Safety/Judgment: Appears intact Cognition Arousal/Alertness: Awake/alert Behavior During Therapy: WFL for tasks assessed/performed Overall Cognitive Status: Impaired/Different from baseline General Comments: for basic mobility tasks. Slow to respond at times. Does not know exact date  but knows year and month.    Blood pressure 135/81, pulse 75, temperature 98 F (36.7 C), temperature source Oral, resp. rate (!) 23, height 6\' 2"  (1.88 m), weight 72 kg, SpO2 97 %. Physical Exam  Nursing note and vitals reviewed. Constitutional: He is oriented to person, place, and time. He appears well-developed and well-nourished.  HENT:  Head: Normocephalic and  atraumatic.  Eyes: EOM are normal. Right eye exhibits no discharge. Left eye exhibits no discharge.  Neck: Normal range of motion. Neck supple.  Cardiovascular: Regular rhythm. Tachycardia present.  Respiratory: Effort normal and breath sounds normal.  GI: Soft. Bowel sounds are normal.  Musculoskeletal:     Comments: No edema or tenderness in extremities  Neurological: He is alert and oriented to person, place, and time. A cranial nerve deficit is present.  HOH with mild dysarthria.  Motor: Bilateral upper extremities: 5/5 proximal distal Bilateral lower extremities: Hip flexion 4-4+/5, knee extension 4+/5, ankle dorsiflexion 5/5 Right ptosis. Able to follow basic motor commands   Skin: Skin is warm and dry.  Psychiatric: He has a normal mood and affect. His behavior is normal.    Results for orders placed or performed during the hospital encounter of 04/06/18 (from the past 24 hour(s))  Magnesium     Status: None   Collection Time: 04/07/18  9:00 AM  Result Value Ref Range   Magnesium 2.2 1.7 - 2.4 mg/dL  Phosphorus     Status: None   Collection Time: 04/07/18  9:00 AM  Result Value Ref Range   Phosphorus 2.8 2.5 - 4.6 mg/dL   Mr Brain Wo Contrast  Result Date: 04/06/2018 CLINICAL DATA:  85 year old male with recent falls. Right leg cramps. EXAM: MRI HEAD WITHOUT CONTRAST TECHNIQUE: Multiplanar, multiecho pulse sequences of the brain and surrounding structures were obtained without intravenous contrast. COMPARISON:  Head and cervical spine CT earlier today. Brain MRI 08/01/2017. FINDINGS: Brain: Oval 12 millimeter focus of restricted diffusion in the left posterior centrum semiovale near the perirolandic cortex (series 4, image 28). Minimal T2 hyperintensity. No hemorrhage or mass effect. No other restricted diffusion. Stable cerebral volume. Patchy and widely scattered cerebral white matter T2 and FLAIR hyperintensity outside of the acute finding is stable. There is a small area of  chronic cortical encephalomalacia in the left superior frontal gyrus (series 7, image 25). No chronic cerebral blood products. Mild to moderate for age chronic T2 heterogeneity in the deep gray matter nuclei. Chronic bilateral posterior cerebellar infarcts. No midline shift, mass effect, evidence of mass lesion, ventriculomegaly, extra-axial collection or acute intracranial hemorrhage. Cervicomedullary junction and pituitary are within normal limits. Vascular: Major intracranial vascular flow voids are stable since 2019. Dominant right vertebral artery with moderate fusiform dolichoectasia in the posterior fossa again noted. The left ICA siphon and terminus also are ectatic. Skull and upper cervical spine: Negative visible cervical spine. Visualized bone marrow signal is within normal limits. Sinuses/Orbits: Postoperative changes to both globes since the prior MRI. Stable paranasal sinuses. Other: Mastoids remain clear. Visible internal auditory structures appear normal. Scalp and face soft tissues appear negative. IMPRESSION: 1. Acute small vessel white matter infarct in the left centrum semiovale near the left perirolandic cortex. No hemorrhage or mass effect. 2. Otherwise stable chronic ischemic disease in the brain since May 2019. 3. Chronic intracranial artery dolichoectasia. Electronically Signed   By: Genevie Ann M.D.   On: 04/06/2018 13:09   Mr Cervical Spine Wo Contrast  Result Date: 04/06/2018 CLINICAL DATA:  Fall.  Neck pain EXAM: MRI CERVICAL SPINE WITHOUT CONTRAST TECHNIQUE: Multiplanar, multisequence MR imaging of the cervical spine was performed. No intravenous contrast was administered. COMPARISON:  CT cervical spine today 04/06/2018 FINDINGS: Alignment: Normal alignment.  Mild dextroscoliosis. Vertebrae: Negative for fracture or bone marrow edema. Cord: Negative for cord compression. Cord signal normal. No epidural hematoma. Posterior Fossa, vertebral arteries, paraspinal tissues: Negative Disc  levels: C2-3: Mild facet degeneration on the left.  Negative for stenosis C3-4: Mild disc and mild facet degeneration. Mild foraminal narrowing bilaterally. C4-5: Disc degeneration and spurring. Mild facet degeneration. Negative for stenosis. C5-6: Mild right foraminal narrowing due to spurring. C6-7: Mild disc degeneration.  Negative for stenosis C7-T1: Mild facet degeneration on the left. No significant stenosis. IMPRESSION: Negative for fracture.  Negative for spinal stenosis Degenerative changes as above. Electronically Signed   By: Franchot Gallo M.D.   On: 04/06/2018 13:30   Mr Lumbar Spine Wo Contrast  Result Date: 04/06/2018 CLINICAL DATA:  Low back pain.  Recent falls.  Right leg cramps EXAM: MRI LUMBAR SPINE WITHOUT CONTRAST TECHNIQUE: Multiplanar, multisequence MR imaging of the lumbar spine was performed. No intravenous contrast was administered. COMPARISON:  MRI lumbar spine 06/26/2017 FINDINGS: Segmentation:  Normal Alignment:  Dextroscoliosis.  Mild retrolisthesis L1-2 and L2-3 Vertebrae:  Negative for fracture or mass lesion. Conus medullaris and cauda equina: Conus extends to the L1-2 level. Conus and cauda equina appear normal. Paraspinal and other soft tissues: Bilateral renal cysts. No retroperitoneal mass or adenopathy Disc levels: T12-L1: Mild disc degeneration L1-2: Disc degeneration and spurring on the left with moderate subarticular stenosis, unchanged. L2-3: Disc degeneration and spurring on the left. Mild subarticular stenosis on the left unchanged. L3-4: Mild disc degeneration and mild spurring left greater than right. Mild facet degeneration. Mild subarticular stenosis on the left L4-5: Disc degeneration with disc bulging and spurring. Mild subarticular stenosis on the right L5-S1: Bilateral facet degeneration. Mild right foraminal narrowing due to spurring, unchanged. IMPRESSION: Negative for fracture Scoliosis and multilevel degenerative change, stable from the prior MRI.  Electronically Signed   By: Franchot Gallo M.D.   On: 04/06/2018 13:24   Dg Hip Unilat W Or Wo Pelvis 2-3 Views Right  Result Date: 04/06/2018 CLINICAL DATA:  Per EMS, patient coming from [place] with complaints of two witnessed falls. First fall was yesterday afternoon. He was bending over to pick something up from the floor and fell. Did not get evaluated after this incident. This morning he was going to use the urinal and had some pain/weakness in the right leg which led to a fall. Patient is currently complaining of pain in the right leg but was able to ambulate to the stretcher with EMS. EXAM: DG HIP (WITH OR WITHOUT PELVIS) 2-3V RIGHT COMPARISON:  None. FINDINGS: No fracture.  No bone lesion. Hip joints, SI joints and symphysis pubis are normally aligned. There are surgical vascular clips in the pelvis consistent with a prior prostatectomy. Soft tissues otherwise unremarkable. IMPRESSION: 1. No fracture, dislocation or significant joint abnormality. Electronically Signed   By: Lajean Manes M.D.   On: 04/06/2018 09:34   Vas US Carotid  Result Date: 04/07/2018 Carotid Arterial Duplex Study Indications:       CVA. Risk Factors:      Hypertension, hyperlipidemia. Other Factors:     Prostate cancer. Comparison Study:  Exam for comparison 07/03/2017 available Performing Technologist: Toma Copier RVS  Examination Guidelines: A complete evaluation includes B-mode imaging, spectral Doppler, color Doppler,  and power Doppler as needed of all accessible portions of each vessel. Bilateral testing is considered an integral part of a complete examination. Limited examinations for reoccurring indications may be performed as noted.  Right Carotid Findings: +----------+--------+--------+--------+------------+--------------------+           PSV cm/sEDV cm/sStenosisDescribe    Comments             +----------+--------+--------+--------+------------+--------------------+ CCA  Prox  107     4                           mild intimal changes +----------+--------+--------+--------+------------+--------------------+ CCA Distal93      11                          mild intimal changes +----------+--------+--------+--------+------------+--------------------+ ICA Prox  58      10                          mild intimal changes +----------+--------+--------+--------+------------+--------------------+ ICA Mid   81      14                          tortuous             +----------+--------+--------+--------+------------+--------------------+ ICA Distal103     19                          tortuous             +----------+--------+--------+--------+------------+--------------------+ ECA       114     1               heterogenousmild plaque          +----------+--------+--------+--------+------------+--------------------+ +----------+--------+-------+--------+-------------------+           PSV cm/sEDV cmsDescribeArm Pressure (mmHG) +----------+--------+-------+--------+-------------------+ [id number]                                        +----------+--------+-------+--------+-------------------+ +---------+--------+--+--------+--+ VertebralPSV cm/s99EDV cm/s13 +---------+--------+--+--------+--+ No significant change noted from previous exam  Left Carotid Findings: +----------+--------+--------+--------+------------+--------------------+           PSV cm/sEDV cm/sStenosisDescribe    Comments             +----------+--------+--------+--------+------------+--------------------+ CCA Prox  85      12                          mild intimal cahnges +----------+--------+--------+--------+------------+--------------------+ CCA Distal86      11                                               +----------+--------+--------+--------+------------+--------------------+ ICA Prox  78      10      1-39%   heterogenousmild plaque           +----------+--------+--------+--------+------------+--------------------+ ICA Mid   83      16                          tortuous             +----------+--------+--------+--------+------------+--------------------+ ICA  Distal58      15                          tortuous             +----------+--------+--------+--------+------------+--------------------+ ECA       65      2                                                +----------+--------+--------+--------+------------+--------------------+ +----------+--------+--------+--------+-------------------+ SubclavianPSV cm/sEDV cm/sDescribeArm Pressure (mmHG) +----------+--------+--------+--------+-------------------+           107                                         +----------+--------+--------+--------+-------------------+ +---------+--------+--+--------+-+ VertebralPSV cm/s56EDV cm/s7 +---------+--------+--+--------+-+ No significant change noted from previous exam  Summary: Right Carotid: Velocities in the right ICA are consistent with a 1-39% stenosis.                See technician comments above. Left Carotid: There is no evidence of stenosis in the left ICA. See technican               comment s above. Vertebrals:  Bilateral vertebral arteries demonstrate antegrade flow. Subclavians: Normal flow hemodynamics were seen in bilateral subclavian              arteries. *See table(s) above for measurements and observations.  Electronically signed by Harold Barban MD on 04/07/2018 at 4:59:47 PM.    Final    Vas [place] Lower Extremity Venous (dvt)  Result Date: 04/07/2018  Lower Venous Study Indications: Pain.  Risk Factors: Known back problems. Performing Technologist: Toma Copier RVS  Examination Guidelines: A complete evaluation includes B-mode imaging, spectral Doppler, color Doppler, and power Doppler as needed of all accessible portions of each vessel. Bilateral testing is considered an integral part of a complete  examination. Limited examinations for reoccurring indications may be performed as noted.  Right Venous Findings: +---------+---------------+---------+-----------+----------+-------+          CompressibilityPhasicitySpontaneityPropertiesSummary +---------+---------------+---------+-----------+----------+-------+ CFV      Full           Yes      Yes                          +---------+---------------+---------+-----------+----------+-------+ SFJ      Full                                                 +---------+---------------+---------+-----------+----------+-------+ FV Prox  Full           Yes      Yes                          +---------+---------------+---------+-----------+----------+-------+ FV Mid   Full                                                 +---------+---------------+---------+-----------+----------+-------+ FV DistalFull  Yes      Yes                          +---------+---------------+---------+-----------+----------+-------+ PFV      Full           Yes      Yes                          +---------+---------------+---------+-----------+----------+-------+ POP      Full           Yes      Yes                          +---------+---------------+---------+-----------+----------+-------+ PTV      Full                                                 +---------+---------------+---------+-----------+----------+-------+ PERO     Full                                                 +---------+---------------+---------+-----------+----------+-------+ Incidental finding. Arterial signals are triphasic throughout  Left Venous Findings: +---------+---------------+---------+-----------+----------+-------+          CompressibilityPhasicitySpontaneityPropertiesSummary +---------+---------------+---------+-----------+----------+-------+ CFV      Full           Yes      Yes                           +---------+---------------+---------+-----------+----------+-------+ SFJ      Full                                                 +---------+---------------+---------+-----------+----------+-------+ FV Prox  Full           Yes      Yes                          +---------+---------------+---------+-----------+----------+-------+ FV Mid   Full                                                 +---------+---------------+---------+-----------+----------+-------+ FV DistalFull           Yes      Yes                          +---------+---------------+---------+-----------+----------+-------+ PFV      Full           Yes      Yes                          +---------+---------------+---------+-----------+----------+-------+ POP      Full           Yes      Yes                          +---------+---------------+---------+-----------+----------+-------+  PTV      Full                                                 +---------+---------------+---------+-----------+----------+-------+ PERO     Full                                                 +---------+---------------+---------+-----------+----------+-------+ Incidental findings. Arterial signals are triphasic throughout    Summary: Right: There is no evidence of deep vein thrombosis in the lower extremity. No cystic structure found in the popliteal fossa. See technical comment above Left: There is no evidence of deep vein thrombosis in the lower extremity. No cystic structure found in the popliteal fossa. See technical comment above  *See table(s) above for measurements and observations. Electronically signed by Harold Barban MD on 04/07/2018 at 4:59:40 PM.    Final     Assessment/Plan: Diagnosis: left centrum ovale infarct Labs and images (see above) independently reviewed.  Records reviewed and summated above. Stroke: Continue secondary stroke prophylaxis and Risk Factor Modification listed below:     Antiplatelet therapy:   Blood Pressure Management:  Continue current medication with prn's with permisive HTN per primary team Statin Agent:   Motor recovery: Fluoxetine  1. Does the need for close, 24 hr/day medical supervision in concert with the patient's rehab needs make it unreasonable for this patient to be served in a less intensive setting? Yes  2. Co-Morbidities requiring supervision/potential complications: HTN (monitor and provide prns in accordance with increased physical exertion and pain), prostate CA, recent falls, tachypnea (monitor RR and O2 Sats with increased physical exertion), Tachycardia (consider ECG, monitor in accordance with pain and increasing activity) 3. Due to safety, disease management and patient education, does the patient require 24 hr/day rehab nursing? Yes 4. Does the patient require coordinated care of a physician, rehab nurse, PT (1-2 hrs/day, 5 days/week) and OT (1-2 hrs/day, 5 days/week) to address physical and functional deficits in the context of the above medical diagnosis(es)? Yes Addressing deficits in the following areas: balance, endurance, locomotion, strength, transferring, bathing, dressing, toileting and psychosocial support 5. Can the patient actively participate in an intensive therapy program of at least 3 hrs of therapy per day at least 5 days per week? Yes 6. The potential for patient to make measurable gains while on inpatient rehab is excellent 7. Anticipated functional outcomes upon discharge from inpatient rehab are modified independent and supervision  with PT, modified independent and supervision with OT, n/a with SLP. 8. Estimated rehab length of stay to reach the above functional goals is: 9-14 days. 9. Anticipated D/C setting: Home 10. Anticipated post D/C treatments: HH therapy and Home excercise program 11. Overall Rehab/Functional Prognosis: good  RECOMMENDATIONS: This patient's condition is appropriate for continued  rehabilitative care in the following setting: CIR if adequate caregiver support available upon discharge when medically stable and able to tolerate 3 hours of therapy per day after completion of medical work-up. Patient has agreed to participate in recommended program. Yes Note that insurance prior authorization may be required for reimbursement for recommended care.  Comment: Rehab Admissions Coordinator to follow up.   I have personally performed a face to face diagnostic evaluation, including, but not  limited to relevant history and physical exam findings, of this patient and developed relevant assessment and plan.  Additionally, I have reviewed and concur with the physician assistant's documentation above.   Delice Lesch, MD, ABPMR Bary Leriche, PA-C 04/08/2018

## 2018-04-08 DIAGNOSIS — I639 Cerebral infarction, unspecified: Secondary | ICD-10-CM

## 2018-04-08 DIAGNOSIS — I6381 Other cerebral infarction due to occlusion or stenosis of small artery: Principal | ICD-10-CM

## 2018-04-08 DIAGNOSIS — R000 Tachycardia, unspecified: Secondary | ICD-10-CM

## 2018-04-08 DIAGNOSIS — F411 Generalized anxiety disorder: Secondary | ICD-10-CM

## 2018-04-08 DIAGNOSIS — R0682 Tachypnea, not elsewhere classified: Secondary | ICD-10-CM

## 2018-04-08 DIAGNOSIS — Z8546 Personal history of malignant neoplasm of prostate: Secondary | ICD-10-CM

## 2018-04-08 DIAGNOSIS — I10 Essential (primary) hypertension: Secondary | ICD-10-CM

## 2018-04-08 NOTE — Progress Notes (Signed)
PROGRESS NOTE    [name] Duffy   OYD:[id number]  DOB: 06-Jun-1929  DOA: 04/06/2018 PCP: Burnard Bunting, MD   Brief Narrative:  [name] Duffy is a 85 y.o.malewith past medical history of hypertension, falls, lipidemia and arthritis, presented to the hospital with complaints of fall and right leg weakness. MRIscan of the head showed left centrum semiovale stroke  Subjective: He feels his left leg is still weak and he is still having trouble ambulating. No other complaints today.     Assessment & Plan:    Principal Problem:   Acute CVA (cerebrovascular accident) - Carotid duplex showed no hemodynamically significant stenosis- see report below - 2 D ECHO showed> ? LA mass vs artifact- TEE has been requested - A1c is 5.5 - LDL -  89- Statin changed due to being on Amlodipine on Lipitor instead of Simvastatin - recommendations for discharge> ASA 81 mg and Plavix 75 mg x 3 wks and then Plavix alone - - PT /OT evals complete- CIR consulted based on these evals  Active Problems:   Anxiety  - cont home dose of Xanax  HTN - holding Losartan to allow permissive HTN    H/O malignant neoplasm of prostate   Very hard of hearing - has hearing aids   Time spent in minutes: 30 DVT prophylaxis: Lovenox- change to SCDs as he is high risk for bleed while on ASA and Plavix- also is more ambulatory now Code Status: Full code Family Communication:  Disposition Plan: CIR vs SNF Consultants:   Neuro  Cardiology  Procedures:  Study Conclusions  - Left ventricle: The cavity size was normal. Wall thickness was increased in a pattern of mild LVH. Systolic function was normal. The estimated ejection fraction was in the range of 60% to 65%. Wall motion was normal; there were no regional wall motion abnormalities. Doppler parameters are consistent with abnormal left ventricular relaxation (grade 1 diastolic dysfunction). The E/e&' ratio is <8, suggesting normal  LV filling pressure. - Mitral valve: Mildly thickened leaflets . There was trivial regurgitation. - Left atrium: Appears moderately dilated - ill defined opacitiy seen in multiple views in the LA, suspect artifact or less likely thrombus/mass, but consider TEE to further evaluate in the setting of stroke. - Tricuspid valve: There was trivial regurgitation. - Pulmonary arteries: PA peak pressure: 27 mm Hg (S). - Inferior vena cava: The vessel was normal in size. The respirophasic diameter changes were in the normal range (>= 50%), consistent with normal central venous pressure.  Impressions:  - LVEF 60-65%, mild LVH, normal wall motion, grade 1 DD, normal LV filling pressure, probably moderate LAE - ?LA mass versus more likely artifact - consider TEE to evaluate further given the study indication of stroke, trivial MR and TR, RVSP 27 mmHg, normal IVC. Antimicrobials:  Anti-infectives (From admission, onward)   None       Objective: Vitals:   04/08/18 0138 04/08/18 0400 04/08/18 0926 04/08/18 1141  BP: (!) 134/92 135/81 125/86 137/85  Pulse: 74 75 (!) 101 100  Resp:  (!) 23 20 (!) 25  Temp: 98.3 F (36.8 C) 98 F (36.7 C) 97.6 F (36.4 C) (!) 97.4 F (36.3 C)  TempSrc: Oral Oral Oral Oral  SpO2: 97% 97% 97% 98%  Weight:      Height:        Intake/Output Summary (Last 24 hours) at 04/08/2018 1240 Last data filed at 04/08/2018 0400 Gross per 24 hour  Intake 360 ml  Output 700 ml  Net -340 ml   Filed Weights   04/07/18 0412 04/07/18 2000  Weight: 71.2 kg 72 kg    Examination: General exam: Appears comfortable  HEENT: PERRLA, oral mucosa moist, no sclera icterus or thrush Respiratory system: Clear to auscultation. Respiratory effort normal. Cardiovascular system: S1 & S2 heard, RRR.   Gastrointestinal system: Abdomen soft, non-tender, nondistended. Normal bowel sounds. Central nervous system: Alert and oriented. No focal neurological  deficits- normal strength in right leg today Extremities: No cyanosis, clubbing or edema Skin: No rashes or ulcers Psychiatry:  Mood & affect appropriate.     Data Reviewed: I have personally reviewed following labs and imaging studies  CBC: Recent Labs  Lab 04/06/18 0840 04/06/18 1806 04/07/18 0715  WBC 6.8 7.8 6.9  NEUTROABS 4.2  --   --   HGB 14.4 15.3 15.8  HCT 45.4 47.3 49.2  MCV 90.3 85.8 85.9  PLT 193 185 237   Basic Metabolic Panel: Recent Labs  Lab 04/06/18 0840 04/06/18 1806 04/07/18 0715 04/07/18 0900  NA 142  --  139  --   K 3.9  --  4.1  --   CL 106  --  105  --   CO2 27  --  23  --   GLUCOSE 97  --  91  --   BUN 19  --  13  --   CREATININE 1.05 1.01 0.95  --   CALCIUM 10.1  --  10.2  --   MG  --   --   --  2.2  PHOS  --   --   --  2.8   GFR: Estimated Creatinine Clearance: 56.8 mL/min (by C-G formula based on SCr of 0.95 mg/dL). Liver Function Tests: Recent Labs  Lab 04/06/18 0840 04/07/18 0715  AST 22 25  ALT 22 21  ALKPHOS 53 61  BILITOT 1.2 1.1  PROT 6.7 7.1  ALBUMIN 4.1 3.9   No results for input(s): LIPASE, AMYLASE in the last 168 hours. No results for input(s): AMMONIA in the last 168 hours. Coagulation Profile: No results for input(s): INR, PROTIME in the last 168 hours. Cardiac Enzymes: Recent Labs  Lab 04/06/18 0840  TROPONINI <0.03   BNP (last 3 results) No results for input(s): PROBNP in the last 8760 hours. HbA1C: Recent Labs    04/06/18 1806  HGBA1C 5.5   CBG: No results for input(s): GLUCAP in the last 168 hours. Lipid Profile: Recent Labs    04/07/18 0715  CHOL 160  HDL 47  LDLCALC 89  TRIG 122  CHOLHDL 3.4   Thyroid Function Tests: Recent Labs    04/06/18 1806  TSH 1.649   Anemia Panel: No results for input(s): VITAMINB12, FOLATE, FERRITIN, TIBC, IRON, RETICCTPCT in the last 72 hours. Urine analysis:    Component Value Date/Time   COLORURINE STRAW (A) 04/06/2018 0740   APPEARANCEUR CLEAR  04/06/2018 0740   LABSPEC 1.005 04/06/2018 0740   PHURINE 7.0 04/06/2018 0740   GLUCOSEU NEGATIVE 04/06/2018 0740   HGBUR NEGATIVE 04/06/2018 0740   BILIRUBINUR NEGATIVE 04/06/2018 0740   KETONESUR NEGATIVE 04/06/2018 0740   PROTEINUR NEGATIVE 04/06/2018 0740   UROBILINOGEN 0.2 01/06/2013 0709   NITRITE NEGATIVE 04/06/2018 0740   LEUKOCYTESUR NEGATIVE 04/06/2018 0740   Sepsis Labs: @LABRCNTIP (procalcitonin:4,lacticidven:4) ) Recent Results (from the past 240 hour(s))  MRSA PCR Screening     Status: None   Collection Time: 04/06/18  7:47 PM  Result Value Ref Range Status   MRSA by PCR NEGATIVE  NEGATIVE Final    Comment:        The GeneXpert MRSA Assay (FDA approved for NASAL specimens only), is one component of a comprehensive MRSA colonization surveillance program. It is not intended to diagnose MRSA infection nor to guide or monitor treatment for MRSA infections. Performed at [facility], [place] [address] [place], [place] [address]          Radiology Studies: Mr Brain 80 Contrast  Result Date: 04/06/2018 CLINICAL DATA:  85 year old male with recent falls. Right leg cramps. EXAM: MRI HEAD WITHOUT CONTRAST TECHNIQUE: Multiplanar, multiecho pulse sequences of the brain and surrounding structures were obtained without intravenous contrast. COMPARISON:  Head and cervical spine CT earlier today. Brain MRI 08/01/2017. FINDINGS: Brain: Oval 12 millimeter focus of restricted diffusion in the left posterior centrum semiovale near the perirolandic cortex (series 4, image 28). Minimal T2 hyperintensity. No hemorrhage or mass effect. No other restricted diffusion. Stable cerebral volume. Patchy and widely scattered cerebral white matter T2 and FLAIR hyperintensity outside of the acute finding is stable. There is a small area of chronic cortical encephalomalacia in the left superior frontal gyrus (series 7, image 25). No chronic cerebral blood products. Mild to moderate for age  chronic T2 heterogeneity in the deep gray matter nuclei. Chronic bilateral posterior cerebellar infarcts. No midline shift, mass effect, evidence of mass lesion, ventriculomegaly, extra-axial collection or acute intracranial hemorrhage. Cervicomedullary junction and pituitary are within normal limits. Vascular: Major intracranial vascular flow voids are stable since 2019. Dominant right vertebral artery with moderate fusiform dolichoectasia in the posterior fossa again noted. The left ICA siphon and terminus also are ectatic. Skull and upper cervical spine: Negative visible cervical spine. Visualized bone marrow signal is within normal limits. Sinuses/Orbits: Postoperative changes to both globes since the prior MRI. Stable paranasal sinuses. Other: Mastoids remain clear. Visible internal auditory structures appear normal. Scalp and face soft tissues appear negative. IMPRESSION: 1. Acute small vessel white matter infarct in the left centrum semiovale near the left perirolandic cortex. No hemorrhage or mass effect. 2. Otherwise stable chronic ischemic disease in the brain since May 2019. 3. Chronic intracranial artery dolichoectasia. Electronically Signed   By: Genevie Ann M.D.   On: 04/06/2018 13:09   Mr Cervical Spine Wo Contrast  Result Date: 04/06/2018 CLINICAL DATA:  Fall.  Neck pain EXAM: MRI CERVICAL SPINE WITHOUT CONTRAST TECHNIQUE: Multiplanar, multisequence MR imaging of the cervical spine was performed. No intravenous contrast was administered. COMPARISON:  CT cervical spine today 04/06/2018 FINDINGS: Alignment: Normal alignment.  Mild dextroscoliosis. Vertebrae: Negative for fracture or bone marrow edema. Cord: Negative for cord compression. Cord signal normal. No epidural hematoma. Posterior Fossa, vertebral arteries, paraspinal tissues: Negative Disc levels: C2-3: Mild facet degeneration on the left.  Negative for stenosis C3-4: Mild disc and mild facet degeneration. Mild foraminal narrowing bilaterally.  C4-5: Disc degeneration and spurring. Mild facet degeneration. Negative for stenosis. C5-6: Mild right foraminal narrowing due to spurring. C6-7: Mild disc degeneration.  Negative for stenosis C7-T1: Mild facet degeneration on the left. No significant stenosis. IMPRESSION: Negative for fracture.  Negative for spinal stenosis Degenerative changes as above. Electronically Signed   By: Franchot Gallo M.D.   On: 04/06/2018 13:30   Mr Lumbar Spine Wo Contrast  Result Date: 04/06/2018 CLINICAL DATA:  Low back pain.  Recent falls.  Right leg cramps EXAM: MRI LUMBAR SPINE WITHOUT CONTRAST TECHNIQUE: Multiplanar, multisequence MR imaging of the lumbar spine was performed. No intravenous contrast was administered. COMPARISON:  MRI lumbar spine 06/26/2017 FINDINGS: Segmentation:  Normal Alignment:  Dextroscoliosis.  Mild retrolisthesis L1-2 and L2-3 Vertebrae:  Negative for fracture or mass lesion. Conus medullaris and cauda equina: Conus extends to the L1-2 level. Conus and cauda equina appear normal. Paraspinal and other soft tissues: Bilateral renal cysts. No retroperitoneal mass or adenopathy Disc levels: T12-L1: Mild disc degeneration L1-2: Disc degeneration and spurring on the left with moderate subarticular stenosis, unchanged. L2-3: Disc degeneration and spurring on the left. Mild subarticular stenosis on the left unchanged. L3-4: Mild disc degeneration and mild spurring left greater than right. Mild facet degeneration. Mild subarticular stenosis on the left L4-5: Disc degeneration with disc bulging and spurring. Mild subarticular stenosis on the right L5-S1: Bilateral facet degeneration. Mild right foraminal narrowing due to spurring, unchanged. IMPRESSION: Negative for fracture Scoliosis and multilevel degenerative change, stable from the prior MRI. Electronically Signed   By: Franchot Gallo M.D.   On: 04/06/2018 13:24   Vas US Carotid  Result Date: 04/07/2018 Carotid Arterial Duplex Study Indications:        CVA. Risk Factors:      Hypertension, hyperlipidemia. Other Factors:     Prostate cancer. Comparison Study:  Exam for comparison 07/03/2017 available Performing Technologist: Toma Copier RVS  Examination Guidelines: A complete evaluation includes B-mode imaging, spectral Doppler, color Doppler, and power Doppler as needed of all accessible portions of each vessel. Bilateral testing is considered an integral part of a complete examination. Limited examinations for reoccurring indications may be performed as noted.  Right Carotid Findings: +----------+--------+--------+--------+------------+--------------------+           PSV cm/sEDV cm/sStenosisDescribe    Comments             +----------+--------+--------+--------+------------+--------------------+ CCA Prox  107     4                           mild intimal changes +----------+--------+--------+--------+------------+--------------------+ CCA Distal93      11                          mild intimal changes +----------+--------+--------+--------+------------+--------------------+ ICA Prox  58      10                          mild intimal changes +----------+--------+--------+--------+------------+--------------------+ ICA Mid   81      14                          tortuous             +----------+--------+--------+--------+------------+--------------------+ ICA Distal103     19                          tortuous             +----------+--------+--------+--------+------------+--------------------+ ECA       114     1               heterogenousmild plaque          +----------+--------+--------+--------+------------+--------------------+ +----------+--------+-------+--------+-------------------+           PSV cm/sEDV cmsDescribeArm Pressure (mmHG) +----------+--------+-------+--------+-------------------+ [id number]                                         +----------+--------+-------+--------+-------------------+ +---------+--------+--+--------+--+  VertebralPSV cm/s99EDV cm/s13 +---------+--------+--+--------+--+ No significant change noted from previous exam  Left Carotid Findings: +----------+--------+--------+--------+------------+--------------------+           PSV cm/sEDV cm/sStenosisDescribe    Comments             +----------+--------+--------+--------+------------+--------------------+ CCA Prox  85      12                          mild intimal cahnges +----------+--------+--------+--------+------------+--------------------+ CCA Distal86      11                                               +----------+--------+--------+--------+------------+--------------------+ ICA Prox  78      10      1-39%   heterogenousmild plaque          +----------+--------+--------+--------+------------+--------------------+ ICA Mid   83      16                          tortuous             +----------+--------+--------+--------+------------+--------------------+ ICA Distal58      15                          tortuous             +----------+--------+--------+--------+------------+--------------------+ ECA       65      2                                                +----------+--------+--------+--------+------------+--------------------+ +----------+--------+--------+--------+-------------------+ SubclavianPSV cm/sEDV cm/sDescribeArm Pressure (mmHG) +----------+--------+--------+--------+-------------------+           107                                         +----------+--------+--------+--------+-------------------+ +---------+--------+--+--------+-+ VertebralPSV cm/s56EDV cm/s7 +---------+--------+--+--------+-+ No significant change noted from previous exam  Summary: Right Carotid: Velocities in the right ICA are consistent with a 1-39% stenosis.                See technician comments above. Left  Carotid: There is no evidence of stenosis in the left ICA. See technican               comment s above. Vertebrals:  Bilateral vertebral arteries demonstrate antegrade flow. Subclavians: Normal flow hemodynamics were seen in bilateral subclavian              arteries. *See table(s) above for measurements and observations.  Electronically signed by Harold Barban MD on 04/07/2018 at 4:59:47 PM.    Final    Vas [place] Lower Extremity Venous (dvt)  Result Date: 04/07/2018  Lower Venous Study Indications: Pain.  Risk Factors: Known back problems. Performing Technologist: Toma Copier RVS  Examination Guidelines: A complete evaluation includes B-mode imaging, spectral Doppler, color Doppler, and power Doppler as needed of all accessible portions of each vessel. Bilateral testing is considered an integral part of a complete examination. Limited examinations for reoccurring indications may be performed as noted.  Right Venous Findings: +---------+---------------+---------+-----------+----------+-------+  CompressibilityPhasicitySpontaneityPropertiesSummary +---------+---------------+---------+-----------+----------+-------+ CFV      Full           Yes      Yes                          +---------+---------------+---------+-----------+----------+-------+ SFJ      Full                                                 +---------+---------------+---------+-----------+----------+-------+ FV Prox  Full           Yes      Yes                          +---------+---------------+---------+-----------+----------+-------+ FV Mid   Full                                                 +---------+---------------+---------+-----------+----------+-------+ FV DistalFull           Yes      Yes                          +---------+---------------+---------+-----------+----------+-------+ PFV      Full           Yes      Yes                           +---------+---------------+---------+-----------+----------+-------+ POP      Full           Yes      Yes                          +---------+---------------+---------+-----------+----------+-------+ PTV      Full                                                 +---------+---------------+---------+-----------+----------+-------+ PERO     Full                                                 +---------+---------------+---------+-----------+----------+-------+ Incidental finding. Arterial signals are triphasic throughout  Left Venous Findings: +---------+---------------+---------+-----------+----------+-------+          CompressibilityPhasicitySpontaneityPropertiesSummary +---------+---------------+---------+-----------+----------+-------+ CFV      Full           Yes      Yes                          +---------+---------------+---------+-----------+----------+-------+ SFJ      Full                                                 +---------+---------------+---------+-----------+----------+-------+ FV Prox  Full  Yes      Yes                          +---------+---------------+---------+-----------+----------+-------+ FV Mid   Full                                                 +---------+---------------+---------+-----------+----------+-------+ FV DistalFull           Yes      Yes                          +---------+---------------+---------+-----------+----------+-------+ PFV      Full           Yes      Yes                          +---------+---------------+---------+-----------+----------+-------+ POP      Full           Yes      Yes                          +---------+---------------+---------+-----------+----------+-------+ PTV      Full                                                 +---------+---------------+---------+-----------+----------+-------+ PERO     Full                                                  +---------+---------------+---------+-----------+----------+-------+ Incidental findings. Arterial signals are triphasic throughout    Summary: Right: There is no evidence of deep vein thrombosis in the lower extremity. No cystic structure found in the popliteal fossa. See technical comment above Left: There is no evidence of deep vein thrombosis in the lower extremity. No cystic structure found in the popliteal fossa. See technical comment above  *See table(s) above for measurements and observations. Electronically signed by Harold Barban MD on 04/07/2018 at 4:59:40 PM.    Final       Scheduled Meds: . aspirin EC  81 mg Oral Daily  . atorvastatin  20 mg Oral q1800  . citalopram  20 mg Oral Daily  . clopidogrel  75 mg Oral Daily  . docusate sodium  100 mg Oral BID  . enoxaparin (LOVENOX) injection  40 mg Subcutaneous Q24H  . latanoprost  1 drop Right Eye QHS  . sodium chloride flush  3 mL Intravenous Q12H   Continuous Infusions: . sodium chloride       LOS: 2 days      Debbe Odea, MD [facility] Pager: [email or web address] Password [email or web address] 04/08/2018, 12:40 PM

## 2018-04-08 NOTE — Progress Notes (Signed)
Patient has questions regarding the Transesophageal Echocardiogram. Nurse explained the procedure again, but patient is requesting to speak with the doctor. Patient stated "he was confused about the procedure and his nephew wanted more information." Patient also stated he wanted to move the procedure back due to his "wife moving from independent living to skilled nursing facility in the morning because he wanted to be close by." Nurse tried to educate about the procedure, but patient and nephew are still requesting to speak with the doctor. Kathyrn Drown, NP notified. Nurse will continue to monitor.

## 2018-04-08 NOTE — Progress Notes (Signed)
Physical Therapy Treatment Patient Details Name: [name] Duffy MRN: [id number] DOB: Jun 06, 1929 Today's Date: 04/08/2018    History of Present Illness Patient is a 85 y/o male who presents with RLE weakness and 2 witnessed falls at home. 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 PMH includes HTN, glaucoma, prostate ca, anxiety.     PT Comments    Patient progressing well towards PT goals. Tolerated gait training today with use of SPC for support. Requires Min A at times to steady, multiple standing rest breaks taken. No dragging of RLE noted but still some weakness evident as well as imbalance. Highly motivated to get better. 1 LOB when trying to lean over and see if there was trash on floor requiring external support to prevent fall. Question safety awareness at times. Continue to recommend CIR. Will follow.    Follow Up Recommendations  CIR     Equipment Recommendations  Other (comment)(defer)    Recommendations for Other Services       Precautions / Restrictions Precautions Precautions: Fall Restrictions Weight Bearing Restrictions: No    Mobility  Bed Mobility Overal bed mobility: Needs Assistance Bed Mobility: Supine to Sit     Supine to sit: Supervision;HOB elevated     General bed mobility comments: Heavy use of rail to get to EOB. no physical assist.   Transfers Overall transfer level: Needs assistance Equipment used: Straight cane Transfers: Sit to/from Stand Sit to Stand: Min assist         General transfer comment: increased time and effort to stand with Min A. Cues for technique and hand placement. stood from EOB x2, from toilet x1 using rail, transferred to chair post ambulation.  Ambulation/Gait Ambulation/Gait assistance: Min assist Gait Distance (Feet): 120 Feet Assistive device: Straight cane Gait Pattern/deviations: Step-through pattern;Decreased stance time - right;Trunk flexed;Step-to pattern Gait velocity: decreased    General Gait Details: Slow, mildly unsteady gait using SPC for support; no dragging of RLE noted today but mild balance deficits and 2 standing rest breaks.    Stairs             Wheelchair Mobility    Modified Rankin (Stroke Patients Only) Modified Rankin (Stroke Patients Only) Pre-Morbid Rankin Score: Slight disability Modified Rankin: Moderately severe disability     Balance Overall balance assessment: Needs assistance;History of Falls Sitting-balance support: Feet supported;No upper extremity supported Sitting balance-Leahy Scale: Fair     Standing balance support: During functional activity;Single extremity supported Standing balance-Leahy Scale: Poor Standing balance comment: Requires UE support in standing, able to wash hands at sink with close min guard.                High Level Balance Comments: Pt leaning down to tryt o see if there was trash on floor with LOB anteriorly.            Cognition Arousal/Alertness: Awake/alert Behavior During Therapy: WFL for tasks assessed/performed Overall Cognitive Status: No family/caregiver present to determine baseline cognitive functioning                                 General Comments: Seems WFL for basic mobility tasks but not formally assessed.       Exercises      General Comments        Pertinent Vitals/Pain Pain Assessment: No/denies pain    Home Living  Prior Function            PT Goals (current goals can now be found in the care plan section) Progress towards PT goals: Progressing toward goals    Frequency    Min 4X/week      PT Plan Current plan remains appropriate    Co-evaluation              AM-PAC PT "6 Clicks" Mobility   Outcome Measure  Help needed turning from your back to your side while in a flat bed without using bedrails?: A Little Help needed moving from lying on your back to sitting on the side of a flat bed  without using bedrails?: A Little Help needed moving to and from a bed to a chair (including a wheelchair)?: A Little Help needed standing up from a chair using your arms (e.g., wheelchair or bedside chair)?: A Little Help needed to walk in hospital room?: A Little Help needed climbing 3-5 steps with a railing? : A Lot 6 Click Score: 17    End of Session Equipment Utilized During Treatment: Gait belt Activity Tolerance: Patient tolerated treatment well Patient left: in chair;with call bell/phone within reach;with chair alarm set Nurse Communication: Mobility status PT Visit Diagnosis: Hemiplegia and hemiparesis;Difficulty in walking, not elsewhere classified (R26.2);Muscle weakness (generalized) (M62.81);Unsteadiness on feet (R26.81) Hemiplegia - Right/Left: Right Hemiplegia - dominant/non-dominant: Dominant Hemiplegia - caused by: Cerebral infarction     Time: 7290-2111 PT Time Calculation (min) (ACUTE ONLY): 32 min  Charges:  $Gait Training: 23-37 mins                     Wray Kearns, PT, DPT Acute Rehabilitation Services Pager [phone] Office [place] 04/08/2018, 10:40 AM

## 2018-04-08 NOTE — Progress Notes (Signed)
    [facility] has been requested to perform a transesophageal echocardiogram on [name] Duffy for stroke.  After careful review of history and examination, the risks and benefits of transesophageal echocardiogram have been explained including risks of esophageal damage, perforation (1:10,000 risk), bleeding, pharyngeal hematoma as well as other potential complications associated with conscious sedation including aspiration, arrhythmia, respiratory failure and death. Alternatives to treatment were discussed, questions were answered. Patient is willing to proceed.   Kathyrn Drown, NP  04/08/2018 3:23 PM

## 2018-04-08 NOTE — Clinical Social Work Note (Signed)
Clinical Social Work Assessment  Patient Details  Name: [name] Duffy MRN: [id number] Date of Birth: [birth year]  Date of referral:  04/08/18               Reason for consult:  Facility Placement                Permission sought to share information with:  Chartered certified accountant granted to share information::  Yes, Verbal Permission Granted  Name::        Agency::  [facility]  Relationship::     Contact Information:     Housing/Transportation Living arrangements for the past 2 months:  [place] of Information:  Patient, Medical Team, Facility Patient Interpreter Needed:  None Criminal Activity/Legal Involvement Pertinent to Current Situation/Hospitalization:  No - Comment as needed Significant Relationships:  Spouse Lives with:  Self, Spouse Do you feel safe going back to the place where you live?  Yes Need for family participation in patient care:  Yes (Comment)  Care giving concerns:  Patient from [facility] where he provides care for his wife who is paralyzed from the waist down from a spinal infection years ago. Patient aware that rehab is being recommended, and hopeful that he can stay at the hospital for a short period of time before returning to his apartment. Patient agreeable to SNF Rehab if needed, but upset that [facility] will not have a room available for him.   Social Worker assessment / plan:  CSW received call from [place] [facility] to discuss patient. CSW provided report to Joellen Jersey that the patient is very worried about his wife, as she is home alone now that he is here and he is her caregiver. Katie assured CSW that they are checking on the patient's wife. Katie also discussed if the patient needs rehab, that [facility] has no rooms available, but they are in contact with [facility] to see if there are any beds available over there. CSW met with patient to discuss information received from  [facility] and discharge planning. Patient appreciative of update from [facility] that they are checking on the patient's wife and helping her while he is here at the hospital. Patient discussed how he is hopeful to stay at the hospital, that is what he has been told is the plan. Patient did not like information that [facility] would not have room available, but indicated that if they had a space at [facility] then he would consider it.   Employment status:  Retired Forensic scientist:  Medicare PT Recommendations:  Inpatient [place] / Referral to community resources:  [place]  Patient/Family's Response to care:  Patient hopeful for CIR, but agreeable to SNF so that he can get stronger to return home and continue to care for his wife.   Patient/Family's Understanding of and Emotional Response to Diagnosis, Current Treatment, and Prognosis:  Patient pleasant during conversation and aware of deficits. Patient discussed his worry for his wife, how he has been caring for her for years. Patient discussed how unfortunate it was that he had a stroke, but lucky that it was a minor one and he can hopefully gain back what he's lost. Patient appreciative of CSW information.  Emotional Assessment Appearance:  Appears stated age Attitude/Demeanor/Rapport:  Engaged Affect (typically observed):  Pleasant Orientation:  Oriented to Self, Oriented to Place, Oriented to  Time, Oriented to Situation Alcohol / Substance use:  Not Applicable Psych  involvement (Current and /or in the community):  No (Comment)  Discharge Needs  Concerns to be addressed:  Care Coordination Readmission within the last 30 days:  No Current discharge risk:  Physical Impairment, Dependent with Mobility Barriers to Discharge:  Continued Medical Work up, [place], [place] 04/08/2018, 12:43 PM

## 2018-04-08 NOTE — NC FL2 (Signed)
[place] LEVEL OF CARE SCREENING TOOL     IDENTIFICATION  Patient Name: [name] Duffy Birthdate: [birth year] Sex: male Admission Date (Current Location): 04/06/2018  [facility] and [place] Number:  Herbalist and Address:  [facility], [place] [address] [place], [place] [address]      Provider Number: [id number]  Attending Physician Name and Address:  Debbe Odea, MD  Relative Name and Phone Number:       Current Level of Care: Hospital Recommended Level of Care: [place] Prior Approval Number:    Date Approved/Denied:   PASRR Number: Manual review  Discharge Plan: SNF    Current Diagnoses: Patient Active Problem List   Diagnosis Date Noted  . Acute CVA (cerebrovascular accident) ([place]) 04/06/2018  . CVA (cerebral vascular accident) ([place]) 04/06/2018  . 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 Open angle with borderline findings, low risk, unspecified eye 10/12/2014  . Primary open angle glaucoma 10/12/2014  . Anxiety state 07/23/2013  . Blood in the urine 01/08/2013  . Genuine stress incontinence, male 12/13/2011  . H/O malignant neoplasm of prostate 01/28/2011    Orientation RESPIRATION BLADDER Height & Weight     Self, Time, Situation, Place  Normal Continent Weight: 158 lb 11.7 oz (72 kg) Height:  6\' 2"  (188 cm)  BEHAVIORAL SYMPTOMS/MOOD NEUROLOGICAL BOWEL NUTRITION STATUS      Continent Diet(see DC summary)  AMBULATORY STATUS COMMUNICATION OF NEEDS Skin   Limited Assist Verbally Normal                       Personal Care Assistance Level of Assistance  Bathing, Feeding, Dressing Bathing Assistance: Limited assistance Feeding assistance: Independent Dressing Assistance: Limited assistance     Functional Limitations Info  Sight, Hearing, Speech Sight Info: Impaired(glasses) Hearing Info: Impaired(hearing aid) Speech Info: Adequate    SPECIAL CARE FACTORS FREQUENCY  PT (By  licensed PT), OT (By licensed OT)     PT Frequency: 5x/wk OT Frequency: 5x/wk            Contractures Contractures Info: Not present    Additional Factors Info  Code Status, Allergies, Psychotropic Code Status Info: Full Allergies Info: Rofecoxib Psychotropic Info: Celexa 20mg  daily; Xanax 0.25 mg 2x/day PRN         Current Medications (04/08/2018):  This is the current hospital active medication list Current Facility-Administered Medications  Medication Dose Route Frequency Provider Last Rate Last Dose  . 0.9 %  sodium chloride infusion  250 mL Intravenous PRN Pokhrel, Laxman, MD      . acetaminophen (TYLENOL) tablet 650 mg  650 mg Oral Q6H PRN Pokhrel, Laxman, MD       Or  . acetaminophen (TYLENOL) suppository 650 mg  650 mg Rectal Q6H PRN Pokhrel, Laxman, MD      . ALPRAZolam Duanne Moron) tablet 0.25 mg  0.25 mg Oral BID PRN Pokhrel, Laxman, MD   0.25 mg at 04/06/18 2042  . aspirin EC tablet 81 mg  81 mg Oral Daily Raiford Noble [place], DO   81 mg at 04/08/18 6203  . atorvastatin (LIPITOR) tablet 20 mg  20 mg Oral q1800 Raiford Noble [place], DO   20 mg at 04/07/18 1617  . citalopram (CELEXA) tablet 20 mg  20 mg Oral Daily Pokhrel, Laxman, MD   20 mg at 04/08/18 0929  . clopidogrel (PLAVIX) tablet 75 mg  75 mg Oral Daily Kerney Elbe, MD  75 mg at 04/08/18 0929  . docusate sodium (COLACE) capsule 100 mg  100 mg Oral BID Pokhrel, Laxman, MD   100 mg at 04/08/18 0929  . enoxaparin (LOVENOX) injection 40 mg  40 mg Subcutaneous Q24H Pokhrel, Laxman, MD   40 mg at 04/07/18 1703  . latanoprost (XALATAN) 0.005 % ophthalmic solution 1 drop  1 drop Right Eye QHS Pokhrel, Laxman, MD   1 drop at 04/07/18 2203  . ondansetron (ZOFRAN) tablet 4 mg  4 mg Oral Q6H PRN Pokhrel, Laxman, MD       Or  . ondansetron (ZOFRAN) injection 4 mg  4 mg Intravenous Q6H PRN Pokhrel, Laxman, MD      . polyethylene glycol (MIRALAX / GLYCOLAX) packet 17 g  17 g Oral Daily PRN Pokhrel, Laxman, MD      . sodium  chloride flush (NS) 0.9 % injection 3 mL  3 mL Intravenous Q12H Pokhrel, Laxman, MD   3 mL at 04/07/18 2207  . sodium chloride flush (NS) 0.9 % injection 3 mL  3 mL Intravenous PRN Pokhrel, Laxman, MD         Discharge Medications: Please see discharge summary for a list of discharge medications.  Relevant Imaging Results:  Relevant Lab Results:   Additional Information SS#: [id number]  Geralynn Ochs, LCSW

## 2018-04-09 ENCOUNTER — Inpatient Hospital Stay (HOSPITAL_COMMUNITY): Payer: Medicare Other

## 2018-04-09 ENCOUNTER — Encounter (HOSPITAL_COMMUNITY)
Admission: EM | Disposition: A | Discharge status: Rehabilitation Facility | Payer: Self-pay | Source: Skilled Nursing Facility | Attending: Internal Medicine

## 2018-04-09 ENCOUNTER — Encounter (HOSPITAL_COMMUNITY): Payer: Self-pay | Admitting: *Deleted

## 2018-04-09 SURGERY — INVASIVE LAB ABORTED CASE

## 2018-04-09 MED ORDER — SODIUM CHLORIDE 0.9 % IV SOLN
INTRAVENOUS | Status: DC
  Administered 2018-04-09: 10:00:00 via INTRAVENOUS

## 2018-04-09 MED ORDER — FENTANYL CITRATE (PF) 100 MCG/2ML IJ SOLN
INTRAMUSCULAR | Status: DC | PRN
  Administered 2018-04-09: 25 ug via INTRAVENOUS

## 2018-04-09 MED ORDER — MIDAZOLAM HCL (PF) 5 MG/ML IJ SOLN
INTRAMUSCULAR | Status: AC
  Filled 2018-04-09: qty 2

## 2018-04-09 MED ORDER — BUTAMBEN-TETRACAINE-BENZOCAINE 2-2-14 % EX AERO
INHALATION_SPRAY | CUTANEOUS | Status: DC | PRN
  Administered 2018-04-09: 2 via TOPICAL

## 2018-04-09 MED ORDER — DIPHENHYDRAMINE HCL 50 MG/ML IJ SOLN
INTRAMUSCULAR | Status: AC
  Filled 2018-04-09: qty 1

## 2018-04-09 MED ORDER — FENTANYL CITRATE (PF) 100 MCG/2ML IJ SOLN
INTRAMUSCULAR | Status: AC
  Filled 2018-04-09: qty 2

## 2018-04-09 MED ORDER — MIDAZOLAM HCL (PF) 10 MG/2ML IJ SOLN
INTRAMUSCULAR | Status: DC | PRN
  Administered 2018-04-09: 2 mg via INTRAVENOUS
  Administered 2018-04-09: 1 mg via INTRAVENOUS

## 2018-04-09 NOTE — Consult Note (Signed)
            [facility] CM Primary Care Navigator  04/09/2018  [name] Duffy 11/26/1929 [id number]   Went to see patient in the room to identify possible discharge needs but he is off the unit to Endo at the moment per staff report.   Will attempt to see patient at another time when he is available in the room.     Addendum:  Went back to see patient at the bedside to identify possible discharge needs after procedure.  Per MD note, patient presented to the hospital with complaints of fall and right leg weakness from the independent living facility where he and his wife currently lives.  (acute CVA- cerebrovascular accident;  anxiety state)  Patient [facility]  Patient endorsesDr. Burnard Bunting with [facility] ashisprimary careprovider.   [facility].  Patient states that he has been managing his own medications straight out of the containers.   Patientmentioned that he has been driving prior to admission but will be able to use facility transport to bring him to his doctors' appointments after discharge.  Patient reports that he provides care for his wife at the facility, who is paralyzed from the waist down from a spinal infection years ago. Patient also mentioned having home caregiver from [facility] to assist in taking care of his wife in the facility.  Anticipated discharge plan is CIR- [facility] vs. skilled nursing facility rehab- possibly [facility] per Inpatient CM since no bed available on the skilled level care at his facility. Patient is agreeable to either [facility] or SNF- skilled nursing facility, so he can get stronger to return home and continue to care for his wife.   Patientexpressedunderstanding tofollow-up with  primary care provider once hereturns back to facility for a post discharge follow-upwithin1- 2 weeksor sooner if needs arise.Patient letter (with PCP's contact number) was provided asareminder.  Explained to patientabout [facility] CM services available for health managementand resources,buthis main concern for now is his wife being able to transfer today to skilled level of care at their facility while he is not there to care for her.  Patientmentioned being able to get help from facility staffto check on them andassistin managing with health needs whenever it is needed.  Patient was encouraged and agreed to discuss with primary care provider on his next visit, about further needsand assistancein managinghis health conditions oncehegetsback home.  Patientexpressedunderstandingto seekreferral from primary care provider to [facility] care management ifdeemed necessary and appropriate for any servicesin thenearfuture when he returns to home(independent living facility).   [facility] care management information provided for future needs that patient may have.  Primary care provider's office is listed as providing transition of care (TOC).  Noted order for EMMI Stroke calls already in place to follow-up patient's recovery.   For additional questions please contact:  Edwena Felty A. Levon Penning, BSN, RN-BC [facility] PRIMARY CARE Navigator Cell: [phone]

## 2018-04-09 NOTE — Progress Notes (Addendum)
Occupational Therapy Treatment Patient Details Name: [name] Duffy MRN: [id number] DOB: 1930-03-07 Today's Date: 04/09/2018    History of present illness Patient is a 85 y/o male who presents with RLE weakness and 2 witnessed falls at home. 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 PMH includes HTN, glaucoma, prostate ca, anxiety.    OT comments  Patient agreeable to OT.  Completing transfers with min assist today, cueing for safety and hand placement.  LB dressing with min assist, grooming standing at sink with min guard for safety with reliant support in standing, forward lean with hips at sink. Patient educated on safety and fall prevention, continues to require min to min guard assist for stability for in room mobility with SPC.  Attempting to pull up from commode with door versus grab bar. Patient eager for rehab and highly motivated.  Will benefit from intensive CIR level rehab in order to optimize independence and safety and return to PLOF.    Follow Up Recommendations  CIR;Supervision - Intermittent    Equipment Recommendations  Other (comment)(TBD at next venue of care)    Recommendations for Other Services      Precautions / Restrictions Precautions Precautions: Fall Precaution Comments: watch BP Restrictions Weight Bearing Restrictions: No       Mobility Bed Mobility Overal bed mobility: Needs Assistance Bed Mobility: Supine to Sit;Sit to Supine     Supine to sit: Supervision;HOB elevated Sit to supine: Supervision   General bed mobility comments: increased time and effort, use of rails for mobility   Transfers Overall transfer level: Needs assistance Equipment used: Straight cane Transfers: Sit to/from Stand Sit to Stand: Min assist         General transfer comment: requires cueing for hand placement and safety, min assist to min guard for sit to stand     Balance Overall balance assessment: Needs assistance;History of  Falls Sitting-balance support: Feet supported;No upper extremity supported Sitting balance-Leahy Scale: Fair     Standing balance support: During functional activity;Single extremity supported Standing balance-Leahy Scale: Poor Standing balance comment: reliant on support, UE or foward lean of hips during grooming tasks                            ADL either performed or assessed with clinical judgement   ADL Overall ADL's : Needs assistance/impaired     Grooming: Min guard;Standing;Wash/dry hands Grooming Details (indicate cue type and reason): min guard for safety and balance with 0 hand support, cueing to prevent forward lean on counter and stand erect             Lower Body Dressing: Minimal assistance;Sit to/from stand Lower Body Dressing Details (indicate cue type and reason): donning shoes with supervision seated EOB, min assist in standing  Toilet Transfer: Minimal assistance;Ambulation;Regular Toilet;Grab bars(cane) Toilet Transfer Details (indicate cue type and reason): min assist to regular commode for steady assist, cueing for hand placement and safety          Functional mobility during ADLs: Cane;Min guard;Minimal assistance General ADL Comments: min assist to min guard for safety, unsteadiness and decreased activity tolerance     Vision       Perception     Praxis      Cognition Arousal/Alertness: Awake/alert Behavior During Therapy: WFL for tasks assessed/performed Overall Cognitive Status: Within Functional Limits for tasks assessed  General Comments: appears WFL for self care tasks, min cueing for safety awareness          Exercises Other Exercises Other Exercises: Sit to stand x5 from low bed with cues for eccentric control for strengthening   Shoulder Instructions       General Comments      Pertinent Vitals/ Pain       Pain Assessment: Faces Faces Pain Scale: No hurt Pain  Location: neck Pain Descriptors / Indicators: Sore Pain Intervention(s): Monitored during session  Home Living                                          Prior Functioning/Environment              Frequency  Min 2X/week        Progress Toward Goals  OT Goals(current goals can now be found in the care plan section)  Progress towards OT goals: Progressing toward goals  Acute Rehab OT Goals Patient Stated Goal: to get back home to my wife OT Goal Formulation: With patient Time For Goal Achievement: 04/21/18 Potential to Achieve Goals: Good  Plan Discharge plan remains appropriate;Frequency remains appropriate    Co-evaluation                 AM-PAC OT "6 Clicks" Daily Activity     Outcome Measure   Help from another person eating meals?: None Help from another person taking care of personal grooming?: A Little Help from another person toileting, which includes using toliet, bedpan, or urinal?: A Little Help from another person bathing (including washing, rinsing, drying)?: A Little Help from another person to put on and taking off regular upper body clothing?: A Little Help from another person to put on and taking off regular lower body clothing?: A Little 6 Click Score: 19    End of Session Equipment Utilized During Treatment: Other (comment)(cane)  OT Visit Diagnosis: Unsteadiness on feet (R26.81);Other abnormalities of gait and mobility (R26.89);Muscle weakness (generalized) (M62.81);History of falling (Z91.81)   Activity Tolerance Patient tolerated treatment well   Patient Left in bed;with call bell/phone within reach;with bed alarm set;with family/visitor present   Nurse Communication Mobility status        Time: 1325-1340 OT Time Calculation (min): 15 min  Charges: OT General Charges $OT Visit: 1 Visit OT Treatments $Self Care/Home Management : 8-22 mins  Delight Stare, [place] Pager  [phone] Office [phone]    Delight Stare 04/09/2018, 2:46 PM

## 2018-04-09 NOTE — PMR Pre-admission (Signed)
PMR Admission Coordinator Pre-Admission Assessment  Patient: [name] Duffy is an 85 y.o., male MRN: [id number] DOB: 08-08-1929 Height: 6\' 2"  (188 cm) Weight: 72.4 kg              Insurance Information HMO: Yes    PPO:       PCP:       IPA:       80/20:       OTHER:   PRIMARY: Medicare A and B      Policy#: [id number]      Subscriber: patient CM Name:        Phone#:       Fax#:   Pre-Cert#:        Employer: Retired Benefits:  Phone #:       Name: Checked in passport one source [employer] Date: 12/18/96     Deduct: $1408      Out of Pocket Max: none      Life Max: N/A CIR: 100%      SNF: 100 days Outpatient: 80%     Co-Pay: 20% Home Health: 100%      Co-Pay: none DME: 80%     Co-Pay: 20% Providers: patient's choice  SECONDARY: BCBS supplement      Policy#: [id number]      Subscriber: patient CM Name:        Phone#:       Fax#:   Pre-Cert#:        Employer: Retired Benefits:  Phone #: [phone]     Name:   Eff. Date:       Deduct:        Out of Pocket Max:        Life Max:   CIR:        SNF:   Outpatient:       Co-Pay:   Home Health:        Co-Pay:   DME:       Co-Pay:    Medicaid Application Date:        Case Manager:   Disability Application Date:        Case Worker:    Emergency Contact Information Contact Information    Name Relation Home Work Mobile   [name] Duffy Spouse [place], [place]   [phone]     Current Medical History  Patient Admitting Diagnosis:  L centrum ovale infarct  History of Present Illness: An 85 y.o. male with history of HTN, prostate CA, recent falls; who was admitted on 04/06/18 with reports of fall the day prior to admission with difficulty walking and weight bearing on RLE.  History taken from chart review and patient.  CT A head/neck without acute changes or fracture.  MRI brain reviewed, sinus small left-sided infarct.  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  MRI cervical  spine/lumbar spine showed scoliosis with multilevel DDD.  Hip X rays negative for fracture. BLE dopplers negative for DVT. 2D echo done revealing mild LVH with EF 60-65% and question of LA mass. TEE recommended for work up. Neurology recommends ASA/Plavix X 3 weeks followed by Plavix alone for stroke due to small vessel. Therapy evaluation done revealing mild cognitive impairment with decreased processing, balance deficits and RLE weakness with fatigue. CIR recommended due to functional decline.    Complete NIHSS TOTAL: 0  Past Medical History  Past Medical History:  Diagnosis Date  . Anxiety   . Arthritis  hands  . Blood transfusion without reported diagnosis    pt thinks had blood trnsfusion at prostate ca surgery   . Glaucoma   . High cholesterol   . Hypertension   . Prostate cancer [facility])     Family History  family history includes Hypertension in an other family member.  Prior Rehab/Hospitalizations: No previous rehab  Has the patient had major surgery during 100 days prior to admission? No  Current Medications   Current Facility-Administered Medications:  .  0.9 %  sodium chloride infusion, 250 mL, Intravenous, PRN, Pokhrel, Laxman, MD .  acetaminophen (TYLENOL) tablet 650 mg, 650 mg, Oral, Q6H PRN **OR** acetaminophen (TYLENOL) suppository 650 mg, 650 mg, Rectal, Q6H PRN, Pokhrel, Laxman, MD .  ALPRAZolam Duanne Moron) tablet 0.25 mg, 0.25 mg, Oral, BID PRN, Pokhrel, Laxman, MD, 0.25 mg at 04/06/18 2042 .  aspirin EC tablet 81 mg, 81 mg, Oral, Daily, Raiford Noble Trainer, [place], 81 mg at 04/08/18 8416 .  atorvastatin (LIPITOR) tablet 20 mg, 20 mg, Oral, q1800, Raiford Noble Latif, DO, 20 mg at 04/08/18 1751 .  citalopram (CELEXA) tablet 20 mg, 20 mg, Oral, Daily, Pokhrel, Laxman, MD, 20 mg at 04/08/18 0929 .  clopidogrel (PLAVIX) tablet 75 mg, 75 mg, Oral, Daily, Kerney Elbe, MD, 75 mg at 04/08/18 0929 .  docusate sodium (COLACE) capsule 100 mg, 100 mg, Oral, BID, Pokhrel, Laxman,  MD, 100 mg at 04/08/18 2138 .  latanoprost (XALATAN) 0.005 % ophthalmic solution 1 drop, 1 drop, Right Eye, QHS, Pokhrel, Laxman, MD, 1 drop at 04/08/18 2138 .  ondansetron (ZOFRAN) tablet 4 mg, 4 mg, Oral, Q6H PRN **OR** ondansetron (ZOFRAN) injection 4 mg, 4 mg, Intravenous, Q6H PRN, Pokhrel, Laxman, MD .  polyethylene glycol (MIRALAX / GLYCOLAX) packet 17 g, 17 g, Oral, Daily PRN, Pokhrel, Laxman, MD .  sodium chloride flush (NS) 0.9 % injection 3 mL, 3 mL, Intravenous, Q12H, Pokhrel, Laxman, MD, 3 mL at 04/08/18 2140 .  sodium chloride flush (NS) 0.9 % injection 3 mL, 3 mL, Intravenous, PRN, Pokhrel, Laxman, MD  Patients Current Diet:  Diet Order            Diet Heart Room service appropriate? Yes; Fluid consistency: Thin  Diet effective now              Precautions / Restrictions Precautions Precautions: Fall Precaution Comments: watch BP (high today) Restrictions Weight Bearing Restrictions: No   Has the patient had 2 or more falls or a fall with injury in the past year?Yes.  Patient reports at least 4 falls with injuries this past year.  Prior Activity Level Limited Community (1-2x/wk): Martin Majestic out a couple times a week to the store, was driving.  Home Assistive Devices / Equipment Home Assistive Devices/Equipment: Kasandra Knudsen (specify quad or straight) Home Equipment: Cane - single point, Wheelchair - manual, Grab bars - toilet, Grab bars - tub/shower  Prior Device Use: Indicate devices/aids used by the patient prior to current illness, exacerbation or injury? None  Prior Functional Level Prior Function Level of Independence: Independent with assistive device(s) Comments: Uses SPC for ambulation. Fell backwards into tub 1st of the year. Has PCA come in to help care for wife everyday for a few hours. Wife is a paraplegic. Pt helps with ADls (changes briefs, helps with transfers). Drives. Does some cooking.   Self Care: Did the patient need help bathing, dressing, using the toilet  or eating?  Independent  Indoor Mobility: Did the patient need assistance with walking from room  to room (with or without device)? Independent  Stairs: Did the patient need assistance with internal or external stairs (with or without device)? Independent  Functional Cognition: Did the patient need help planning regular tasks such as shopping or remembering to take medications? Independent  Current Functional Level Cognition  Arousal/Alertness: Awake/alert Overall Cognitive Status: No family/caregiver present to determine baseline cognitive functioning Orientation Level: Oriented X4 General Comments: Seems WFL for basic mobility tasks but not formally assessed.  Attention: Selective Selective Attention: Impaired Selective Attention Impairment: Verbal complex Memory: (WFL; immediate recall 5/5, delayed recall 5/5) Awareness: Appears intact Problem Solving: Impaired Problem Solving Impairment: Verbal basic(slow processing; defers to mask difficulties) Executive Function: Reasoning Reasoning: Appears intact Safety/Judgment: Appears intact    Extremity Assessment (includes Sensation/Coordination)  Upper Extremity Assessment: Overall WFL for tasks assessed  Lower Extremity Assessment: RLE deficits/detail RLE Deficits / Details: Grossly ~4/5 throughout RLE Sensation: WNL RLE Coordination: WNL    ADLs  Overall ADL's : Needs assistance/impaired Eating/Feeding: Independent, Sitting Grooming: Minimal assistance, Standing Grooming Details (indicate cue type and reason): increased time and effort to stand Upper Body Bathing: Supervision/ safety, Set up, Sitting Lower Body Bathing: Minimal assistance, Sit to/from stand Lower Body Bathing Details (indicate cue type and reason): increased time and effort to stand Upper Body Dressing : Set up, Supervision/safety, Sitting Lower Body Dressing: Minimal assistance, Sit to/from stand Lower Body Dressing Details (indicate cue type and reason):  increased time and effort to stand Toilet Transfer: Minimal assistance, Ambulation, BSC Toilet Transfer Details (indicate cue type and reason): over toilet Toileting- Clothing Manipulation and Hygiene: Minimal assistance Toileting - Clothing Manipulation Details (indicate cue type and reason): increased time and effort to stand    Mobility  Overal bed mobility: Needs Assistance Bed Mobility: Supine to Sit Supine to sit: Supervision, HOB elevated General bed mobility comments: Heavy use of rail to get to EOB. no physical assist.     Transfers  Overall transfer level: Needs assistance Equipment used: Straight cane Transfers: Sit to/from Stand Sit to Stand: Min assist General transfer comment: increased time and effort to stand with Min A. Cues for technique and hand placement. stood from EOB x2, from toilet x1 using rail, transferred to chair post ambulation.    Ambulation / Gait / Stairs / Wheelchair Mobility  Ambulation/Gait Ambulation/Gait assistance: Herbalist (Feet): 120 Feet Assistive device: Straight cane Gait Pattern/deviations: Step-through pattern, Decreased stance time - right, Trunk flexed, Step-to pattern General Gait Details: Slow, mildly unsteady gait using SPC for support; no dragging of RLE noted today but mild balance deficits and 2 standing rest breaks.  Gait velocity: decreased    Posture / Balance Balance Overall balance assessment: Needs assistance, History of Falls Sitting-balance support: Feet supported, No upper extremity supported Sitting balance-Leahy Scale: Fair Standing balance support: During functional activity, Single extremity supported Standing balance-Leahy Scale: Poor Standing balance comment: Requires UE support in standing, able to wash hands at sink with close min guard.  High Level Balance Comments: Pt leaning down to tryt o see if there was trash on floor with LOB anteriorly.    Special needs/care consideration BiPAP/CPAP  No CPM No Continuous Drip IV No Dialysis No       Life Vest No Oxygen No Special Bed No Trach Size No Wound Vac (area) No     Skin No                      Bowel mgmt: Last documented BM  04/05/18, but patient reports small BM 04/09/18 Bladder mgmt: Voiding in urinal WDL Diabetic mgmt No    Previous Home Environment Living Arrangements: Spouse/significant other  Lives With: Spouse Available Help at Discharge: Personal care attendant, Family, Available PRN/intermittently Type of Home: [place] Name: [place]: One level Home Access: Elevator Bathroom Shower/Tub: Tub/shower unit, Theatre stage manager Toilet: Handicapped height [place]: No  Discharge Living Setting Plans for Discharge Living Setting: Lives with (comment), Apartment(Lives in apartment at [place].) Type of Home at Discharge: Apartment([place] apartment at [facility] Discharge Home Layout: One level(On the third floor of the building with elevator.) Discharge Home Access: Level entry, Elevator Does the patient have any problems obtaining your medications?: No  Social/Family/Support Systems Patient Roles: Spouse(Has a wife who is paraplegic and w/c bound.) Contact Information: Oree Hislop - wife - [phone] Ability/Limitations of Caregiver: Wife is w/c bound and he was the caregiver for his wife. Caregiver Availability: Intermittent Discharge Plan Discussed with Primary Caregiver: Yes Is Caregiver In Agreement with Plan?: Yes Does Caregiver/Family have Issues with Lodging/Transportation while Pt is in Rehab?: No  Goals/Additional Needs Patient/Family Goal for Rehab: PT/OT mod I and supervision goals Expected length of stay: 9-14 days Cultural Considerations: [employer] Dietary Needs: Heart diet, thin liquids Equipment Needs: TBD Pt/Family Agrees to Admission and willing to participate: Yes Program Orientation Provided & Reviewed with Pt/Caregiver  Including Roles  & Responsibilities: Yes  Decrease burden of Care through IP rehab admission: N/A  Possible need for SNF placement upon discharge: Not planned  Patient Condition: This patient's medical and functional status has changed since the consult dated: 04/08/18 in which the Rehabilitation Physician determined and documented that the patient's condition is appropriate for intensive rehabilitative care in an inpatient rehabilitation facility. See "History of Present Illness" (above) for medical update. Functional changes are: Currently requiring min assist to ambulate 120 feet [place]. Patient's medical and functional status update has been discussed with the Rehabilitation physician and patient remains appropriate for inpatient rehabilitation. Will admit to inpatient rehab today.  Preadmission Screen Completed By:  Retta Diones, 04/09/2018 12:21 PM ______________________________________________________________________   Discussed status with Dr. Posey Pronto on 04/10/18 at 1621 and received telephone approval for admission today.  Admission Coordinator:  Retta Diones, time 1621/Date 04/10/18

## 2018-04-09 NOTE — Progress Notes (Signed)
SLP Cancellation Note  Patient Details Name: OLIN GURSKI MRN: [id number] DOB: 12/25/1929   Cancelled treatment:        Patient preparing to leave floor for STAT imaging test/procedure.   Charlynne Cousins Tayli Buch, MA, CCC-SLP 04/09/2018 1:17 PM

## 2018-04-09 NOTE — Progress Notes (Signed)
Physical Therapy Treatment Patient Details Name: [name] Duffy MRN: [id number] DOB: Mar 08, 1930 Today's Date: 04/09/2018    History of Present Illness Patient is a 85 y/o male who presents with RLE weakness and 2 witnessed falls at home. 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 PMH includes HTN, glaucoma, prostate ca, anxiety.     PT Comments    Patient progressing well towards PT goals. Tolerated gait training with Min A for balance/safety secondary to right knee instability and imbalance. HR up to 119 bpm. Worked on functional strengthening of sit to stands with cues for proper technique as pt with difficulty standing from low surfaces. Continues to be a fall risk. Eager to get to rehab. Plan for CT angiogram as TEE aborted this AM. Will follow.  Follow Up Recommendations  CIR     Equipment Recommendations  None recommended by PT    Recommendations for Other Services       Precautions / Restrictions Precautions Precautions: Fall Restrictions Weight Bearing Restrictions: No    Mobility  Bed Mobility Overal bed mobility: Needs Assistance Bed Mobility: Supine to Sit     Supine to sit: Supervision;HOB elevated     General bed mobility comments: Heavy use of rail to get to EOB. no physical assist.   Transfers Overall transfer level: Needs assistance Equipment used: Straight cane Transfers: Sit to/from Stand Sit to Stand: Mod assist         General transfer comment: Initially Mod A to power up to standing with cues for hand placement/technique. with proper technique, Min A progressing to Min guard x5.  Ambulation/Gait Ambulation/Gait assistance: Min assist Gait Distance (Feet): 120 Feet Assistive device: Straight cane Gait Pattern/deviations: Step-through pattern;Decreased stance time - right;Trunk flexed Gait velocity: decreased   General Gait Details: Slow, mildly unsteady gait using SPC for support; right knee instability noted and almost LOB  with head turns. 1 standing rest break. HR up to 119 bpm   Stairs             Wheelchair Mobility    Modified Rankin (Stroke Patients Only) Modified Rankin (Stroke Patients Only) Pre-Morbid Rankin Score: Slight disability Modified Rankin: Moderately severe disability     Balance Overall balance assessment: Needs assistance;History of Falls Sitting-balance support: Feet supported;No upper extremity supported Sitting balance-Leahy Scale: Fair     Standing balance support: During functional activity;Single extremity supported Standing balance-Leahy Scale: Poor Standing balance comment: Requires UE support in standing.                            Cognition Arousal/Alertness: Awake/alert Behavior During Therapy: WFL for tasks assessed/performed Overall Cognitive Status: No family/caregiver present to determine baseline cognitive functioning                                 General Comments: Seems WFL for basic mobility tasks.      Exercises Other Exercises Other Exercises: Sit to stand x5 from low bed with cues for eccentric control for strengthening    General Comments        Pertinent Vitals/Pain Pain Assessment: Faces Faces Pain Scale: Hurts a little bit Pain Location: neck Pain Descriptors / Indicators: Sore Pain Intervention(s): Monitored during session    Home Living                      Prior Function  PT Goals (current goals can now be found in the care plan section) Progress towards PT goals: Progressing toward goals    Frequency    Min 4X/week      PT Plan Current plan remains appropriate    Co-evaluation              AM-PAC PT "6 Clicks" Mobility   Outcome Measure  Help needed turning from your back to your side while in a flat bed without using bedrails?: A Little Help needed moving from lying on your back to sitting on the side of a flat bed without using bedrails?: A Little Help  needed moving to and from a bed to a chair (including a wheelchair)?: A Little Help needed standing up from a chair using your arms (e.g., wheelchair or bedside chair)?: A Little Help needed to walk in hospital room?: A Little Help needed climbing 3-5 steps with a railing? : A Lot 6 Click Score: 17    End of Session Equipment Utilized During Treatment: Gait belt Activity Tolerance: Patient tolerated treatment well Patient left: in bed;with call bell/phone within reach;with family/visitor present(left in bed due to procedure) Nurse Communication: Mobility status PT Visit Diagnosis: Hemiplegia and hemiparesis;Difficulty in walking, not elsewhere classified (R26.2);Muscle weakness (generalized) (M62.81);Unsteadiness on feet (R26.81) Hemiplegia - Right/Left: Right Hemiplegia - dominant/non-dominant: Dominant Hemiplegia - caused by: Cerebral infarction     Time: 1235-1252 PT Time Calculation (min) (ACUTE ONLY): 17 min  Charges:  $Gait Training: 8-22 mins                     Wray Kearns, PT, DPT Acute Rehabilitation Services Pager [phone] Office [place] 04/09/2018, 1:02 PM

## 2018-04-09 NOTE — CV Procedure (Addendum)
    PROCEDURE NOTE:  Procedure:  Transesophageal echocardiogram Operator:  Fransico Him, MD Indications:  CVA Complications: None  During this procedure the patient is administered a total of Versed 3 mg and Fentanyl 25 mg to achieve and maintain moderate conscious sedation.  The patient's heart rate, blood pressure, and oxygen saturation are monitored continuously during the procedure. The period of conscious sedation is 15 minutes, of which I was present face-to-face 100% of this time.  Results: Patient's esophagus was intubated with probe but at 35mm there was signiifcant resistance.  The TEE probe appeared to be within the esophagus.  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  Consider Cardiac CT for further evaluation of ? LA mass.  If TEE needed, then recommend upper endoscopy first to define anatomy.  The patient tolerated the procedure well and was transferred back to their room in stable condition.  Signed: Fransico Him, MD [facility]

## 2018-04-09 NOTE — Progress Notes (Signed)
IP rehab admissions - I met with patient.  He is hard of hearing and wears bilateral hearing aides.  He would like inpatient rehab prior to home.  I will await medical clearance from attending MD prior to inpatient rehab admission.  Call me for questions.  [phone]

## 2018-04-09 NOTE — Progress Notes (Signed)
OT Cancellation Note  Patient Details Name: [name] Duffy MRN: [id number] DOB: 1929/12/22   Cancelled Treatment:    Reason Eval/Treat Not Completed: Patient at procedure or test/ unavailable, off unit for TEE.  Will follow.  Delight Stare, OT Acute Rehabilitation Services Pager [phone] Office [phone]   Delight Stare 04/09/2018, 9:43 AM

## 2018-04-09 NOTE — Progress Notes (Signed)
PROGRESS NOTE    AYDIEN MAJETTE   JAS:[id number]  DOB: 17-Feb-1930  DOA: 04/06/2018 PCP: Burnard Bunting, MD   Brief Narrative:  [name] Duffy is a 85 y.o.malewith past medical history of hypertension, falls, lipidemia and arthritis, presented to the hospital with complaints of fall and right leg weakness. MRIscan of the head showed left centrum semiovale stroke  Subjective: No new complaints today   Assessment & Plan:    Principal Problem:   Acute CVA (cerebrovascular accident) - Carotid duplex showed no hemodynamically significant stenosis- see report below - 2 D ECHO showed> ? LA mass vs artifact- TEE has been requested - A1c is 5.5 - LDL -  89- Statin changed due to being on Amlodipine on Lipitor instead of Simvastatin - recommendations for discharge> ASA 81 mg and Plavix 75 mg x 3 wks and then Plavix alone - - PT /OT evals complete- CIR consulted based on these evals - TEE could not be completed as probe could not be advanced ? Due to possibly a zenker's diverticulum- will obtain a cardiac CT to further determine what the mass in the LA is- d/w Dr Leonie Man, stroke team  Active Problems:   Anxiety  - cont home dose of Xanax  HTN - holding Losartan to allow permissive HTN    H/O malignant neoplasm of prostate   Very hard of hearing - has hearing aids   Time spent in minutes: 30 DVT prophylaxis: Lovenox- changed to SCDs as he is high risk for bleed while on ASA and Plavix- also is more ambulatory now Code Status: Full code Family Communication:  Disposition Plan: CIR vs SNF Consultants:   Neuro  Cardiology  Procedures:  Study Conclusions  - Left ventricle: The cavity size was normal. Wall thickness was increased in a pattern of mild LVH. Systolic function was normal. The estimated ejection fraction was in the range of 60% to 65%. Wall motion was normal; there were no regional wall motion abnormalities. Doppler parameters are consistent  with abnormal left ventricular relaxation (grade 1 diastolic dysfunction). The E/e&' ratio is <8, suggesting normal LV filling pressure. - Mitral valve: Mildly thickened leaflets . There was trivial regurgitation. - Left atrium: Appears moderately dilated - ill defined opacitiy seen in multiple views in the LA, suspect artifact or less likely thrombus/mass, but consider TEE to further evaluate in the setting of stroke. - Tricuspid valve: There was trivial regurgitation. - Pulmonary arteries: PA peak pressure: 27 mm Hg (S). - Inferior vena cava: The vessel was normal in size. The respirophasic diameter changes were in the normal range (>= 50%), consistent with normal central venous pressure.  Impressions:  - LVEF 60-65%, mild LVH, normal wall motion, grade 1 DD, normal LV filling pressure, probably moderate LAE - ?LA mass versus more likely artifact - consider TEE to evaluate further given the study indication of stroke, trivial MR and TR, RVSP 27 mmHg, normal IVC. Antimicrobials:  Anti-infectives (From admission, onward)   None       Objective: Vitals:   04/09/18 1030 04/09/18 1040 04/09/18 1159 04/09/18 1647  BP: 139/90 (!) 141/91 (!) 141/97 130/87  Pulse: 87 90 91 86  Resp: 20 (!) 24 20 16   Temp:   97.8 F (36.6 C) 98.5 F (36.9 C)  TempSrc:   Oral Oral  SpO2: 97% 97% 98% 97%  Weight:      Height:        Intake/Output Summary (Last 24 hours) at 04/09/2018 1650 Last data filed at  04/08/2018 1825 Gross per 24 hour  Intake 225 ml  Output -  Net 225 ml   Filed Weights   04/07/18 0412 04/07/18 2000 04/09/18 0533  Weight: 71.2 kg 72 kg 72.4 kg    Examination: General exam: Appears comfortable  HEENT: PERRLA, oral mucosa moist, no sclera icterus or thrush Respiratory system: Clear to auscultation. Respiratory effort normal. Cardiovascular system: S1 & S2 heard, RRR.   Gastrointestinal system: Abdomen soft, non-tender, nondistended.  Normal bowel sounds. Central nervous system: Alert and oriented. No focal neurological deficits- normal strength in right leg today Extremities: No cyanosis, clubbing or edema Skin: No rashes or ulcers Psychiatry:  Mood & affect appropriate.     Data Reviewed: I have personally reviewed following labs and imaging studies  CBC: Recent Labs  Lab 04/06/18 0840 04/06/18 1806 04/07/18 0715  WBC 6.8 7.8 6.9  NEUTROABS 4.2  --   --   HGB 14.4 15.3 15.8  HCT 45.4 47.3 49.2  MCV 90.3 85.8 85.9  PLT 193 185 952   Basic Metabolic Panel: Recent Labs  Lab 04/06/18 0840 04/06/18 1806 04/07/18 0715 04/07/18 0900  NA 142  --  139  --   K 3.9  --  4.1  --   CL 106  --  105  --   CO2 27  --  23  --   GLUCOSE 97  --  91  --   BUN 19  --  13  --   CREATININE 1.05 1.01 0.95  --   CALCIUM 10.1  --  10.2  --   MG  --   --   --  2.2  PHOS  --   --   --  2.8   GFR: Estimated Creatinine Clearance: 57.2 mL/min (by C-G formula based on SCr of 0.95 mg/dL). Liver Function Tests: Recent Labs  Lab 04/06/18 0840 04/07/18 0715  AST 22 25  ALT 22 21  ALKPHOS 53 61  BILITOT 1.2 1.1  PROT 6.7 7.1  ALBUMIN 4.1 3.9   No results for input(s): LIPASE, AMYLASE in the last 168 hours. No results for input(s): AMMONIA in the last 168 hours. Coagulation Profile: No results for input(s): INR, PROTIME in the last 168 hours. Cardiac Enzymes: Recent Labs  Lab 04/06/18 0840  TROPONINI <0.03   BNP (last 3 results) No results for input(s): PROBNP in the last 8760 hours. HbA1C: Recent Labs    04/06/18 1806  HGBA1C 5.5   CBG: No results for input(s): GLUCAP in the last 168 hours. Lipid Profile: Recent Labs    04/07/18 0715  CHOL 160  HDL 47  LDLCALC 89  TRIG 122  CHOLHDL 3.4   Thyroid Function Tests: Recent Labs    04/06/18 1806  TSH 1.649   Anemia Panel: No results for input(s): VITAMINB12, FOLATE, FERRITIN, TIBC, IRON, RETICCTPCT in the last 72 hours. Urine analysis:      Component Value Date/Time   COLORURINE STRAW (A) 04/06/2018 0740   APPEARANCEUR CLEAR 04/06/2018 0740   LABSPEC 1.005 04/06/2018 0740   PHURINE 7.0 04/06/2018 0740   GLUCOSEU NEGATIVE 04/06/2018 0740   HGBUR NEGATIVE 04/06/2018 0740   BILIRUBINUR NEGATIVE 04/06/2018 0740   KETONESUR NEGATIVE 04/06/2018 0740   PROTEINUR NEGATIVE 04/06/2018 0740   UROBILINOGEN 0.2 01/06/2013 0709   NITRITE NEGATIVE 04/06/2018 0740   LEUKOCYTESUR NEGATIVE 04/06/2018 0740   Sepsis Labs: @LABRCNTIP (procalcitonin:4,lacticidven:4) ) Recent Results (from the past 240 hour(s))  MRSA PCR Screening     Status: None  Collection Time: 04/06/18  7:47 PM  Result Value Ref Range Status   MRSA by PCR NEGATIVE NEGATIVE Final    Comment:        The GeneXpert MRSA Assay (FDA approved for NASAL specimens only), is one component of a comprehensive MRSA colonization surveillance program. It is not intended to diagnose MRSA infection nor to guide or monitor treatment for MRSA infections. Performed at [facility], [place] [address] [place], [place] [address]          Radiology Studies: No results found.    Scheduled Meds: . aspirin EC  81 mg Oral Daily  . atorvastatin  20 mg Oral q1800  . citalopram  20 mg Oral Daily  . clopidogrel  75 mg Oral Daily  . docusate sodium  100 mg Oral BID  . latanoprost  1 drop Right Eye QHS  . sodium chloride flush  3 mL Intravenous Q12H   Continuous Infusions: . sodium chloride       LOS: 3 days      Debbe Odea, MD [facility] Pager: [email or web address] Password [facility] 04/09/2018, 4:50 PM

## 2018-04-09 NOTE — Progress Notes (Signed)
PT Cancellation Note  Patient Details Name: KUE FOX MRN: [id number] DOB: Sep 25, 1929   Cancelled Treatment:    Reason Eval/Treat Not Completed: Patient at procedure or test/unavailable Pt off floor at TEE. Will follow.   Marguarite Arbour A Loreena Valeri 04/09/2018, 9:32 AM Wray Kearns, PT, DPT Acute Rehabilitation Services Pager [phone] Office [phone]

## 2018-04-09 NOTE — Care Management Important Message (Signed)
Important Message  Patient Details  Name: [name] Duffy MRN: [id number] Date of Birth: [birth year]   Medicare Important Message Given:  Yes    [name] 04/09/2018, 2:34 PM

## 2018-04-09 NOTE — Interval H&P Note (Signed)
History and Physical Interval Note:  04/09/2018 9:33 AM  [name] Duffy  has presented today for surgery, with the diagnosis of stroke  The various methods of treatment have been discussed with the patient and family. After consideration of risks, benefits and other options for treatment, the patient has consented to  Procedure(s): TRANSESOPHAGEAL ECHOCARDIOGRAM (TEE) (N/A) as a surgical intervention .  The patient's history has been reviewed, patient examined, no change in status, stable for surgery.  I have reviewed the patient's chart and labs.  Questions were answered to the patient's satisfaction.     Fransico Him

## 2018-04-10 ENCOUNTER — Inpatient Hospital Stay (HOSPITAL_COMMUNITY): Payer: Medicare Other | Admitting: Physical Therapy

## 2018-04-10 ENCOUNTER — Inpatient Hospital Stay (HOSPITAL_COMMUNITY): Payer: Medicare Other | Admitting: Occupational Therapy

## 2018-04-10 ENCOUNTER — Encounter (HOSPITAL_COMMUNITY): Payer: Self-pay

## 2018-04-10 ENCOUNTER — Other Ambulatory Visit: Payer: Self-pay

## 2018-04-10 ENCOUNTER — Inpatient Hospital Stay (HOSPITAL_COMMUNITY): Payer: Medicare Other

## 2018-04-10 ENCOUNTER — Inpatient Hospital Stay (HOSPITAL_COMMUNITY)
Admission: RE | Admit: 2018-04-10 | Discharge: 2018-04-19 | DRG: 056 | Disposition: A | Discharge status: Home / Self Care | Payer: Medicare Other | Source: Intra-hospital | Attending: Physical Medicine & Rehabilitation | Admitting: Physical Medicine & Rehabilitation

## 2018-04-10 DIAGNOSIS — M5030 Other cervical disc degeneration, unspecified cervical region: Secondary | ICD-10-CM | POA: Diagnosis present

## 2018-04-10 DIAGNOSIS — I69318 Other symptoms and signs involving cognitive functions following cerebral infarction: Secondary | ICD-10-CM

## 2018-04-10 DIAGNOSIS — I10 Essential (primary) hypertension: Secondary | ICD-10-CM | POA: Diagnosis present

## 2018-04-10 DIAGNOSIS — E8809 Other disorders of plasma-protein metabolism, not elsewhere classified: Secondary | ICD-10-CM | POA: Diagnosis present

## 2018-04-10 DIAGNOSIS — E785 Hyperlipidemia, unspecified: Secondary | ICD-10-CM | POA: Diagnosis present

## 2018-04-10 DIAGNOSIS — I69341 Monoplegia of lower limb following cerebral infarction affecting right dominant side: Secondary | ICD-10-CM | POA: Diagnosis not present

## 2018-04-10 DIAGNOSIS — E46 Unspecified protein-calorie malnutrition: Secondary | ICD-10-CM | POA: Diagnosis present

## 2018-04-10 DIAGNOSIS — M5136 Other intervertebral disc degeneration, lumbar region: Secondary | ICD-10-CM | POA: Diagnosis present

## 2018-04-10 DIAGNOSIS — I6381 Other cerebral infarction due to occlusion or stenosis of small artery: Secondary | ICD-10-CM | POA: Diagnosis present

## 2018-04-10 DIAGNOSIS — R42 Dizziness and giddiness: Secondary | ICD-10-CM | POA: Diagnosis not present

## 2018-04-10 DIAGNOSIS — Z7982 Long term (current) use of aspirin: Secondary | ICD-10-CM

## 2018-04-10 DIAGNOSIS — I6389 Other cerebral infarction: Secondary | ICD-10-CM

## 2018-04-10 DIAGNOSIS — R296 Repeated falls: Secondary | ICD-10-CM | POA: Diagnosis present

## 2018-04-10 DIAGNOSIS — Z8546 Personal history of malignant neoplasm of prostate: Secondary | ICD-10-CM

## 2018-04-10 DIAGNOSIS — F411 Generalized anxiety disorder: Secondary | ICD-10-CM | POA: Diagnosis not present

## 2018-04-10 DIAGNOSIS — H409 Unspecified glaucoma: Secondary | ICD-10-CM

## 2018-04-10 DIAGNOSIS — R195 Other fecal abnormalities: Secondary | ICD-10-CM | POA: Diagnosis not present

## 2018-04-10 DIAGNOSIS — I69398 Other sequelae of cerebral infarction: Secondary | ICD-10-CM | POA: Diagnosis not present

## 2018-04-10 DIAGNOSIS — M6281 Muscle weakness (generalized): Secondary | ICD-10-CM | POA: Diagnosis not present

## 2018-04-10 DIAGNOSIS — R2681 Unsteadiness on feet: Secondary | ICD-10-CM | POA: Diagnosis not present

## 2018-04-10 DIAGNOSIS — G479 Sleep disorder, unspecified: Secondary | ICD-10-CM

## 2018-04-10 DIAGNOSIS — R269 Unspecified abnormalities of gait and mobility: Secondary | ICD-10-CM | POA: Diagnosis not present

## 2018-04-10 DIAGNOSIS — R278 Other lack of coordination: Secondary | ICD-10-CM | POA: Diagnosis not present

## 2018-04-10 LAB — GLUCOSE, CAPILLARY: Glucose-Capillary: 100 mg/dL — ABNORMAL HIGH (ref 70–99)

## 2018-04-10 MED ORDER — CITALOPRAM HYDROBROMIDE 10 MG PO TABS
20.0000 mg | ORAL_TABLET | Freq: Every day | ORAL | Status: DC
  Administered 2018-04-11 – 2018-04-19 (×9): 20 mg via ORAL
  Filled 2018-04-10 (×9): qty 2

## 2018-04-10 MED ORDER — PROCHLORPERAZINE 25 MG RE SUPP: 12.5000 mg | Freq: Four times a day (QID) | RECTAL | Status: DC | PRN

## 2018-04-10 MED ORDER — TRAZODONE HCL 50 MG PO TABS
25.0000 mg | ORAL_TABLET | Freq: Every evening | ORAL | Status: DC | PRN
  Filled 2018-04-10: qty 1

## 2018-04-10 MED ORDER — ASPIRIN EC 81 MG PO TBEC
81.0000 mg | DELAYED_RELEASE_TABLET | Freq: Every day | ORAL | Status: DC
  Administered 2018-04-11 – 2018-04-19 (×9): 81 mg via ORAL
  Filled 2018-04-10 (×9): qty 1

## 2018-04-10 MED ORDER — POLYETHYLENE GLYCOL 3350 17 G PO PACK: 17.0000 g | PACK | Freq: Every day | ORAL | Status: DC | PRN

## 2018-04-10 MED ORDER — PROCHLORPERAZINE EDISYLATE 10 MG/2ML IJ SOLN: 5.0000 mg | Freq: Four times a day (QID) | INTRAMUSCULAR | Status: DC | PRN

## 2018-04-10 MED ORDER — ENOXAPARIN SODIUM 40 MG/0.4ML ~~LOC~~ SOLN
40.0000 mg | SUBCUTANEOUS | Status: DC
  Administered 2018-04-11 – 2018-04-18 (×8): 40 mg via SUBCUTANEOUS
  Filled 2018-04-10 (×9): qty 0.4

## 2018-04-10 MED ORDER — DOCUSATE SODIUM 100 MG PO CAPS: 100.0000 mg | ORAL_CAPSULE | Freq: Two times a day (BID) | ORAL | 0 refills | Status: DC

## 2018-04-10 MED ORDER — CITALOPRAM HYDROBROMIDE 20 MG PO TABS: 20.0000 mg | ORAL_TABLET | Freq: Every day | ORAL | Status: DC

## 2018-04-10 MED ORDER — GUAIFENESIN-DM 100-10 MG/5ML PO SYRP: 5.0000 mL | ORAL_SOLUTION | Freq: Four times a day (QID) | ORAL | Status: DC | PRN

## 2018-04-10 MED ORDER — POLYETHYLENE GLYCOL 3350 17 G PO PACK: 17.0000 g | PACK | Freq: Every day | ORAL | 0 refills | Status: DC | PRN

## 2018-04-10 MED ORDER — GADOBUTROL 1 MMOL/ML IV SOLN
7.5000 mL | Freq: Once | INTRAVENOUS | Status: AC | PRN
  Administered 2018-04-10: 7.5 mL via INTRAVENOUS

## 2018-04-10 MED ORDER — PROCHLORPERAZINE MALEATE 5 MG PO TABS: 5.0000 mg | ORAL_TABLET | Freq: Four times a day (QID) | ORAL | Status: DC | PRN

## 2018-04-10 MED ORDER — ALPRAZOLAM 0.25 MG PO TABS: 0.2500 mg | ORAL_TABLET | Freq: Two times a day (BID) | ORAL | 0 refills | Status: DC | PRN

## 2018-04-10 MED ORDER — ATORVASTATIN CALCIUM 10 MG PO TABS
20.0000 mg | ORAL_TABLET | Freq: Every day | ORAL | Status: DC
  Administered 2018-04-11 – 2018-04-18 (×8): 20 mg via ORAL
  Filled 2018-04-10 (×8): qty 2

## 2018-04-10 MED ORDER — ATORVASTATIN CALCIUM 20 MG PO TABS: 20.0000 mg | ORAL_TABLET | Freq: Every day | ORAL | Status: DC

## 2018-04-10 MED ORDER — LIDOCAINE 5 % EX PTCH
1.0000 | MEDICATED_PATCH | CUTANEOUS | Status: DC
  Administered 2018-04-11 – 2018-04-19 (×8): 1 via TRANSDERMAL
  Filled 2018-04-10 (×10): qty 1

## 2018-04-10 MED ORDER — ALUM & MAG HYDROXIDE-SIMETH 200-200-20 MG/5ML PO SUSP
30.0000 mL | ORAL | Status: DC | PRN
  Administered 2018-04-17: 30 mL via ORAL
  Filled 2018-04-10: qty 30

## 2018-04-10 MED ORDER — BISACODYL 10 MG RE SUPP: 10.0000 mg | Freq: Every day | RECTAL | Status: DC | PRN

## 2018-04-10 MED ORDER — ACETAMINOPHEN 325 MG PO TABS
325.0000 mg | ORAL_TABLET | ORAL | Status: DC | PRN
  Administered 2018-04-16 – 2018-04-18 (×3): 650 mg via ORAL
  Filled 2018-04-10 (×3): qty 2

## 2018-04-10 MED ORDER — ALPRAZOLAM 0.25 MG PO TABS
0.2500 mg | ORAL_TABLET | Freq: Two times a day (BID) | ORAL | Status: DC | PRN
  Administered 2018-04-10 – 2018-04-18 (×9): 0.25 mg via ORAL
  Filled 2018-04-10 (×9): qty 1

## 2018-04-10 MED ORDER — CLOPIDOGREL BISULFATE 75 MG PO TABS
75.0000 mg | ORAL_TABLET | Freq: Every day | ORAL | Status: DC
  Administered 2018-04-11 – 2018-04-19 (×9): 75 mg via ORAL
  Filled 2018-04-10 (×9): qty 1

## 2018-04-10 MED ORDER — FLEET ENEMA 7-19 GM/118ML RE ENEM: 1.0000 | ENEMA | Freq: Once | RECTAL | Status: DC | PRN

## 2018-04-10 MED ORDER — CLOPIDOGREL BISULFATE 75 MG PO TABS: 75.0000 mg | ORAL_TABLET | Freq: Every day | ORAL | Status: DC

## 2018-04-10 MED ORDER — LATANOPROST 0.005 % OP SOLN
1.0000 [drp] | Freq: Every day | OPHTHALMIC | Status: DC
  Administered 2018-04-10 – 2018-04-18 (×9): 1 [drp] via OPHTHALMIC

## 2018-04-10 MED ORDER — DIPHENHYDRAMINE HCL 12.5 MG/5ML PO ELIX: 12.5000 mg | ORAL_SOLUTION | Freq: Four times a day (QID) | ORAL | Status: DC | PRN

## 2018-04-10 NOTE — Progress Notes (Signed)
Patient arrived on rehab unit were he was informed about patient safety plan and rehab process.

## 2018-04-10 NOTE — Progress Notes (Signed)
Pt discharge education and instructions completed. Pt telemetry removed and report called off to CIR RN. Pt discharge to CIR [id number] and pt to be transported off unit via wheelchair with belongings to the side. Delia Heady RN

## 2018-04-10 NOTE — Discharge Summary (Signed)
Physician Discharge Summary  [name] Duffy NOI:[id number] DOB: [birth year] DOA: 04/06/2018  PCP: Burnard Bunting, MD  Admit date: 04/06/2018 Discharge date: 04/10/2018  Admitted From:home  Disposition:  CIR   Recommendations for Outpatient Follow-up:  1. Output follow up for CVA with Dr Leonie Man  Discharge Condition:  start   CODE STATUS:  Full code   Diet recommendation:  Heart healthy Consultations:  Neuro  Cardiology    Discharge Diagnoses:  Principal Problem:   Acute CVA (cerebrovascular accident) [facility]) Active Problems:   Anxiety state   H/O malignant neoplasm of prostate   Benign essential HTN     Brief Summary: [name] Duffy is a 85 y.o.malewith past medical history of hypertension, falls, lipidemia and arthritis, presented to the hospital with complaints of fall and right leg weakness. MRIscan of the head showed left centrum semiovale stroke  Hospital Course:  Principal Problem:   Acute CVA (cerebrovascular accident) - Carotid duplex showed no hemodynamically significant stenosis- see report below - 2 D ECHO showed> ? LA mass vs artifact- TEE has been requested - A1c is 5.5 - LDL -  89- Statin changed due to being on Amlodipine on Lipitor instead of Simvastatin - PT /OT evals complete- CIR consulted based on these evals - TEE could not be completed as probe could not be advanced ? Due to possibly a zenker's diverticulum- after discussion with cardiology, Dr Meda Coffee, we decided to pursue a cardiac MRI - I have been notified by Dr Meda Coffee that this is negative for a atrial mass - recommendations for discharge> ASA 81 mg and Plavix 75 mg x 3 wks and then Plavix alone -  Active Problems:   Anxiety  - cont home dose of Xanax  HTN - have been holding Losartan      H/O malignant neoplasm of prostate   Very hard of hearing - has hearing aids   Discharge Exam: Vitals:   04/10/18 1000 04/10/18 1229  BP: 126/73 133/83  Pulse: 99 86  Resp: 20 16   Temp: 98 F (36.7 C) 97.7 F (36.5 C)  SpO2: 95% 98%   Vitals:   04/09/18 2010 04/09/18 2358 04/10/18 1000 04/10/18 1229  BP: 133/86 124/69 126/73 133/83  Pulse: 79 78 99 86  Resp: 15  20 16   Temp: 98.5 F (36.9 C)  98 F (36.7 C) 97.7 F (36.5 C)  TempSrc: Oral  Oral Oral  SpO2: 97% 97% 95% 98%  Weight:      Height:        General: Pt is alert, awake, not in acute distress Cardiovascular: RRR, S1/S2 +, no rubs, no gallops Respiratory: CTA bilaterally, no wheezing, no rhonchi Abdominal: Soft, NT, ND, bowel sounds + Extremities: no edema, no cyanosis   Discharge Instructions  Discharge Instructions    Increase activity slowly   Complete by:  As directed      Allergies as of 04/10/2018      Reactions   Rofecoxib Swelling   REACTION: swelling- Vioxx name brand       Medication List    STOP taking these medications   lidocaine 5 % Commonly known as:  LIDODERM   losartan 100 MG tablet Commonly known as:  COZAAR   simvastatin 20 MG tablet Commonly known as:  ZOCOR     TAKE these medications   ALIGN PO Take 1 capsule by mouth daily.   ALPRAZolam 0.25 MG tablet Commonly known as:  XANAX Take 1 tablet (0.25 mg total) by mouth 2 (  two) times daily as needed for anxiety or sleep. What changed:    medication strength  how much to take  when to take this  reasons to take this   aspirin EC 81 MG tablet Take 81 mg by mouth at bedtime.   atorvastatin 20 MG tablet Commonly known as:  LIPITOR Take 1 tablet (20 mg total) by mouth daily at 6 PM.   citalopram 20 MG tablet Commonly known as:  CELEXA Take 1 tablet (20 mg total) by mouth daily. Start taking on:  April 11, 2018 What changed:    how much to take  how to take this  when to take this   clopidogrel 75 MG tablet Commonly known as:  PLAVIX Take 1 tablet (75 mg total) by mouth daily. Start taking on:  April 11, 2018   docusate sodium 100 MG capsule Commonly known as:  COLACE Take 1  capsule (100 mg total) by mouth 2 (two) times daily.   polyethylene glycol packet Commonly known as:  MIRALAX / GLYCOLAX Take 17 g by mouth daily as needed for mild constipation.   XALATAN 0.005 % ophthalmic solution Generic drug:  latanoprost Place 1 drop into the right eye at bedtime.       Allergies  Allergen Reactions  . Rofecoxib Swelling    REACTION: swelling- Vioxx name brand      Procedures/Studies: 2 D ECHO Study Conclusions  - Left ventricle: The cavity size was normal. Wall thickness was increased in a pattern of mild LVH. Systolic function was normal. The estimated ejection fraction was in the range of 60% to 65%. Wall motion was normal; there were no regional wall motion abnormalities. Doppler parameters are consistent with abnormal left ventricular relaxation (grade 1 diastolic dysfunction). The E/e&' ratio is <8, suggesting normal LV filling pressure. - Mitral valve: Mildly thickened leaflets . There was trivial regurgitation. - Left atrium: Appears moderately dilated - ill defined opacitiy seen in multiple views in the LA, suspect artifact or less likely thrombus/mass, but consider TEE to further evaluate in the setting of stroke. - Tricuspid valve: There was trivial regurgitation. - Pulmonary arteries: PA peak pressure: 27 mm Hg (S). - Inferior vena cava: The vessel was normal in size. The respirophasic diameter changes were in the normal range (>= 50%), consistent with normal central venous pressure.  Impressions:  - LVEF 60-65%, mild LVH, normal wall motion, grade 1 DD, normal LV filling pressure, probably moderate LAE - ?LA mass versus more likely artifact - consider TEE to evaluate further given the study indication of stroke, trivial MR and TR, RVSP 27 mmHg,  Dg Chest 2 View  Result Date: 04/06/2018 CLINICAL DATA:  Pain following fall EXAM: CHEST - 2 VIEW COMPARISON:  October 15, 2017 FINDINGS: There is stable  elevation of the left hemidiaphragm. There is left base atelectasis. There is no frank edema or consolidation. Heart is upper normal in size with pulmonary vascularity normal. There is a large paraesophageal hernia. There is aortic atherosclerosis. There is no appreciable pneumothorax. There is anterior wedging lower thoracic vertebral bodies, age uncertain. IMPRESSION: Large paraesophageal hernia. Left base atelectasis. Elevation of the left hemidiaphragm, stable. No frank airspace consolidation. Stable cardiac silhouette. There is aortic atherosclerosis. Age uncertain wedge fractures in the lower thoracic region. Aortic Atherosclerosis (ICD10-I70.0). Electronically Signed   By: Lowella Grip III M.D.   On: 04/06/2018 08:13   Dg Lumbar Spine Complete  Result Date: 04/06/2018 CLINICAL DATA:  Pain following fall EXAM: LUMBAR SPINE -  COMPLETE 4+ VIEW COMPARISON:  Lumbar radiographs October 19, 2003 and lumbar MR June 26 2017 FINDINGS: Frontal, lateral, spot lumbosacral lateral, and bilateral oblique views were obtained. There are 5 non-rib-bearing lumbar type vertebral bodies. There is thoracic dextroscoliosis with rotatory component. No acute fracture or spondylolisthesis is evident in the lumbar region. There is disc space narrowing at all levels in the lumbar spine. Vacuum phenomenon is noted at L2-3. There is facet osteoarthritic change at all levels bilaterally. There is aortic atherosclerosis. IMPRESSION: Scoliosis with multilevel arthropathy. No acute fracture or spondylolisthesis. There is aortic atherosclerosis. Aortic Atherosclerosis (ICD10-I70.0). Electronically Signed   By: Lowella Grip III M.D.   On: 04/06/2018 08:15   Ct Head Wo Contrast  Result Date: 04/06/2018 CLINICAL DATA:  Fall. EXAM: CT HEAD WITHOUT CONTRAST CT CERVICAL SPINE WITHOUT CONTRAST TECHNIQUE: Multidetector CT imaging of the head and cervical spine was performed following the standard protocol without intravenous contrast.  Multiplanar CT image reconstructions of the cervical spine were also generated. COMPARISON:  CT head 10/15/2017 FINDINGS: CT HEAD FINDINGS Brain: Mild atrophy. Mild chronic microvascular ischemic change in the white matter. Chronic infarcts in the cerebellum bilaterally unchanged. Negative for acute infarct, hemorrhage, or mass. Vascular: Atherosclerotic disease. Negative for hyperdense vessel. Fusiform enlargement distal right vertebral artery unchanged. Skull: Negative for fracture Sinuses/Orbits: Paranasal sinuses clear. Bilateral cataract surgery. Other: None CT CERVICAL SPINE FINDINGS Alignment: Mild anterolisthesis C3-4.  Mild dextroscoliosis. Skull base and vertebrae: Negative for fracture Soft tissues and spinal canal: Negative for soft tissue mass. Disc levels: Disc degeneration and spurring throughout the cervical spine. Diffuse facet degeneration left greater than right throughout the cervical spine. Upper chest: Negative Other: None IMPRESSION: 1. No acute intracranial abnormality 2. Negative for cervical spine fracture.  Multilevel spondylosis. 3. Fusiform enlargement distal right vertebral artery due to atherosclerotic disease unchanged from prior studies. Electronically Signed   By: Franchot Gallo M.D.   On: 04/06/2018 08:34   Ct Cervical Spine Wo Contrast  Result Date: 04/06/2018 CLINICAL DATA:  Fall. EXAM: CT HEAD WITHOUT CONTRAST CT CERVICAL SPINE WITHOUT CONTRAST TECHNIQUE: Multidetector CT imaging of the head and cervical spine was performed following the standard protocol without intravenous contrast. Multiplanar CT image reconstructions of the cervical spine were also generated. COMPARISON:  CT head 10/15/2017 FINDINGS: CT HEAD FINDINGS Brain: Mild atrophy. Mild chronic microvascular ischemic change in the white matter. Chronic infarcts in the cerebellum bilaterally unchanged. Negative for acute infarct, hemorrhage, or mass. Vascular: Atherosclerotic disease. Negative for hyperdense vessel.  Fusiform enlargement distal right vertebral artery unchanged. Skull: Negative for fracture Sinuses/Orbits: Paranasal sinuses clear. Bilateral cataract surgery. Other: None CT CERVICAL SPINE FINDINGS Alignment: Mild anterolisthesis C3-4.  Mild dextroscoliosis. Skull base and vertebrae: Negative for fracture Soft tissues and spinal canal: Negative for soft tissue mass. Disc levels: Disc degeneration and spurring throughout the cervical spine. Diffuse facet degeneration left greater than right throughout the cervical spine. Upper chest: Negative Other: None IMPRESSION: 1. No acute intracranial abnormality 2. Negative for cervical spine fracture.  Multilevel spondylosis. 3. Fusiform enlargement distal right vertebral artery due to atherosclerotic disease unchanged from prior studies. Electronically Signed   By: Franchot Gallo M.D.   On: 04/06/2018 08:34   Mr Brain Wo Contrast  Result Date: 04/06/2018 CLINICAL DATA:  85 year old male with recent falls. Right leg cramps. EXAM: MRI HEAD WITHOUT CONTRAST TECHNIQUE: Multiplanar, multiecho pulse sequences of the brain and surrounding structures were obtained without intravenous contrast. COMPARISON:  Head and cervical spine CT earlier today. Brain  MRI 08/01/2017. FINDINGS: Brain: Oval 12 millimeter focus of restricted diffusion in the left posterior centrum semiovale near the perirolandic cortex (series 4, image 28). Minimal T2 hyperintensity. No hemorrhage or mass effect. No other restricted diffusion. Stable cerebral volume. Patchy and widely scattered cerebral white matter T2 and FLAIR hyperintensity outside of the acute finding is stable. There is a small area of chronic cortical encephalomalacia in the left superior frontal gyrus (series 7, image 25). No chronic cerebral blood products. Mild to moderate for age chronic T2 heterogeneity in the deep gray matter nuclei. Chronic bilateral posterior cerebellar infarcts. No midline shift, mass effect, evidence of mass  lesion, ventriculomegaly, extra-axial collection or acute intracranial hemorrhage. Cervicomedullary junction and pituitary are within normal limits. Vascular: Major intracranial vascular flow voids are stable since 2019. Dominant right vertebral artery with moderate fusiform dolichoectasia in the posterior fossa again noted. The left ICA siphon and terminus also are ectatic. Skull and upper cervical spine: Negative visible cervical spine. Visualized bone marrow signal is within normal limits. Sinuses/Orbits: Postoperative changes to both globes since the prior MRI. Stable paranasal sinuses. Other: Mastoids remain clear. Visible internal auditory structures appear normal. Scalp and face soft tissues appear negative. IMPRESSION: 1. Acute small vessel white matter infarct in the left centrum semiovale near the left perirolandic cortex. No hemorrhage or mass effect. 2. Otherwise stable chronic ischemic disease in the brain since May 2019. 3. Chronic intracranial artery dolichoectasia. Electronically Signed   By: Genevie Ann M.D.   On: 04/06/2018 13:09   Mr Cervical Spine Wo Contrast  Result Date: 04/06/2018 CLINICAL DATA:  Fall.  Neck pain EXAM: MRI CERVICAL SPINE WITHOUT CONTRAST TECHNIQUE: Multiplanar, multisequence MR imaging of the cervical spine was performed. No intravenous contrast was administered. COMPARISON:  CT cervical spine today 04/06/2018 FINDINGS: Alignment: Normal alignment.  Mild dextroscoliosis. Vertebrae: Negative for fracture or bone marrow edema. Cord: Negative for cord compression. Cord signal normal. No epidural hematoma. Posterior Fossa, vertebral arteries, paraspinal tissues: Negative Disc levels: C2-3: Mild facet degeneration on the left.  Negative for stenosis C3-4: Mild disc and mild facet degeneration. Mild foraminal narrowing bilaterally. C4-5: Disc degeneration and spurring. Mild facet degeneration. Negative for stenosis. C5-6: Mild right foraminal narrowing due to spurring. C6-7: Mild  disc degeneration.  Negative for stenosis C7-T1: Mild facet degeneration on the left. No significant stenosis. IMPRESSION: Negative for fracture.  Negative for spinal stenosis Degenerative changes as above. Electronically Signed   By: Franchot Gallo M.D.   On: 04/06/2018 13:30   Mr Lumbar Spine Wo Contrast  Result Date: 04/06/2018 CLINICAL DATA:  Low back pain.  Recent falls.  Right leg cramps EXAM: MRI LUMBAR SPINE WITHOUT CONTRAST TECHNIQUE: Multiplanar, multisequence MR imaging of the lumbar spine was performed. No intravenous contrast was administered. COMPARISON:  MRI lumbar spine 06/26/2017 FINDINGS: Segmentation:  Normal Alignment:  Dextroscoliosis.  Mild retrolisthesis L1-2 and L2-3 Vertebrae:  Negative for fracture or mass lesion. Conus medullaris and cauda equina: Conus extends to the L1-2 level. Conus and cauda equina appear normal. Paraspinal and other soft tissues: Bilateral renal cysts. No retroperitoneal mass or adenopathy Disc levels: T12-L1: Mild disc degeneration L1-2: Disc degeneration and spurring on the left with moderate subarticular stenosis, unchanged. L2-3: Disc degeneration and spurring on the left. Mild subarticular stenosis on the left unchanged. L3-4: Mild disc degeneration and mild spurring left greater than right. Mild facet degeneration. Mild subarticular stenosis on the left L4-5: Disc degeneration with disc bulging and spurring. Mild subarticular stenosis on the right  L5-S1: Bilateral facet degeneration. Mild right foraminal narrowing due to spurring, unchanged. IMPRESSION: Negative for fracture Scoliosis and multilevel degenerative change, stable from the prior MRI. Electronically Signed   By: Franchot Gallo M.D.   On: 04/06/2018 13:24   Dg Hip Unilat W Or Wo Pelvis 2-3 Views Right  Result Date: 04/06/2018 CLINICAL DATA:  Per EMS, patient coming from [place] with complaints of two witnessed falls. First fall was yesterday afternoon. He was  bending over to pick something up from the floor and fell. Did not get evaluated after this incident. This morning he was going to use the urinal and had some pain/weakness in the right leg which led to a fall. Patient is currently complaining of pain in the right leg but was able to ambulate to the stretcher with EMS. EXAM: DG HIP (WITH OR WITHOUT PELVIS) 2-3V RIGHT COMPARISON:  None. FINDINGS: No fracture.  No bone lesion. Hip joints, SI joints and symphysis pubis are normally aligned. There are surgical vascular clips in the pelvis consistent with a prior prostatectomy. Soft tissues otherwise unremarkable. IMPRESSION: 1. No fracture, dislocation or significant joint abnormality. Electronically Signed   By: Lajean Manes M.D.   On: 04/06/2018 09:34   Vas US Carotid  Result Date: 04/07/2018 Carotid Arterial Duplex Study Indications:       CVA. Risk Factors:      Hypertension, hyperlipidemia. Other Factors:     Prostate cancer. Comparison Study:  Exam for comparison 07/03/2017 available Performing Technologist: Toma Copier RVS  Examination Guidelines: A complete evaluation includes B-mode imaging, spectral Doppler, color Doppler, and power Doppler as needed of all accessible portions of each vessel. Bilateral testing is considered an integral part of a complete examination. Limited examinations for reoccurring indications may be performed as noted.  Right Carotid Findings: +----------+--------+--------+--------+------------+--------------------+           PSV cm/sEDV cm/sStenosisDescribe    Comments             +----------+--------+--------+--------+------------+--------------------+ CCA Prox  107     4                           mild intimal changes +----------+--------+--------+--------+------------+--------------------+ CCA Distal93      11                          mild intimal changes +----------+--------+--------+--------+------------+--------------------+ ICA Prox  58      10                           mild intimal changes +----------+--------+--------+--------+------------+--------------------+ ICA Mid   81      14                          tortuous             +----------+--------+--------+--------+------------+--------------------+ ICA Distal103     19                          tortuous             +----------+--------+--------+--------+------------+--------------------+ ECA       114     1               heterogenousmild plaque          +----------+--------+--------+--------+------------+--------------------+ +----------+--------+-------+--------+-------------------+  PSV cm/sEDV cmsDescribeArm Pressure (mmHG) +----------+--------+-------+--------+-------------------+ [id number]                                        +----------+--------+-------+--------+-------------------+ +---------+--------+--+--------+--+ VertebralPSV cm/s99EDV cm/s13 +---------+--------+--+--------+--+ No significant change noted from previous exam  Left Carotid Findings: +----------+--------+--------+--------+------------+--------------------+           PSV cm/sEDV cm/sStenosisDescribe    Comments             +----------+--------+--------+--------+------------+--------------------+ CCA Prox  85      12                          mild intimal cahnges +----------+--------+--------+--------+------------+--------------------+ CCA Distal86      11                                               +----------+--------+--------+--------+------------+--------------------+ ICA Prox  78      10      1-39%   heterogenousmild plaque          +----------+--------+--------+--------+------------+--------------------+ ICA Mid   83      16                          tortuous             +----------+--------+--------+--------+------------+--------------------+ ICA Distal58      15                          tortuous              +----------+--------+--------+--------+------------+--------------------+ ECA       65      2                                                +----------+--------+--------+--------+------------+--------------------+ +----------+--------+--------+--------+-------------------+ SubclavianPSV cm/sEDV cm/sDescribeArm Pressure (mmHG) +----------+--------+--------+--------+-------------------+           107                                         +----------+--------+--------+--------+-------------------+ +---------+--------+--+--------+-+ VertebralPSV cm/s56EDV cm/s7 +---------+--------+--+--------+-+ No significant change noted from previous exam  Summary: Right Carotid: Velocities in the right ICA are consistent with a 1-39% stenosis.                See technician comments above. Left Carotid: There is no evidence of stenosis in the left ICA. See technican               comment s above. Vertebrals:  Bilateral vertebral arteries demonstrate antegrade flow. Subclavians: Normal flow hemodynamics were seen in bilateral subclavian              arteries. *See table(s) above for measurements and observations.  Electronically signed by Harold Barban MD on 04/07/2018 at 4:59:47 PM.    Final    Vas [place] Lower Extremity Venous (dvt)  Result Date: 04/07/2018  Lower Venous Study Indications: Pain.  Risk Factors: Known back problems. Performing Technologist: Toma Copier RVS  Examination  Guidelines: A complete evaluation includes B-mode imaging, spectral Doppler, color Doppler, and power Doppler as needed of all accessible portions of each vessel. Bilateral testing is considered an integral part of a complete examination. Limited examinations for reoccurring indications may be performed as noted.  Right Venous Findings: +---------+---------------+---------+-----------+----------+-------+          CompressibilityPhasicitySpontaneityPropertiesSummary  +---------+---------------+---------+-----------+----------+-------+ CFV      Full           Yes      Yes                          +---------+---------------+---------+-----------+----------+-------+ SFJ      Full                                                 +---------+---------------+---------+-----------+----------+-------+ FV Prox  Full           Yes      Yes                          +---------+---------------+---------+-----------+----------+-------+ FV Mid   Full                                                 +---------+---------------+---------+-----------+----------+-------+ FV DistalFull           Yes      Yes                          +---------+---------------+---------+-----------+----------+-------+ PFV      Full           Yes      Yes                          +---------+---------------+---------+-----------+----------+-------+ POP      Full           Yes      Yes                          +---------+---------------+---------+-----------+----------+-------+ PTV      Full                                                 +---------+---------------+---------+-----------+----------+-------+ PERO     Full                                                 +---------+---------------+---------+-----------+----------+-------+ Incidental finding. Arterial signals are triphasic throughout  Left Venous Findings: +---------+---------------+---------+-----------+----------+-------+          CompressibilityPhasicitySpontaneityPropertiesSummary +---------+---------------+---------+-----------+----------+-------+ CFV      Full           Yes      Yes                          +---------+---------------+---------+-----------+----------+-------+ SFJ  Full                                                 +---------+---------------+---------+-----------+----------+-------+ FV Prox  Full           Yes      Yes                           +---------+---------------+---------+-----------+----------+-------+ FV Mid   Full                                                 +---------+---------------+---------+-----------+----------+-------+ FV DistalFull           Yes      Yes                          +---------+---------------+---------+-----------+----------+-------+ PFV      Full           Yes      Yes                          +---------+---------------+---------+-----------+----------+-------+ POP      Full           Yes      Yes                          +---------+---------------+---------+-----------+----------+-------+ PTV      Full                                                 +---------+---------------+---------+-----------+----------+-------+ PERO     Full                                                 +---------+---------------+---------+-----------+----------+-------+ Incidental findings. Arterial signals are triphasic throughout    Summary: Right: There is no evidence of deep vein thrombosis in the lower extremity. No cystic structure found in the popliteal fossa. See technical comment above Left: There is no evidence of deep vein thrombosis in the lower extremity. No cystic structure found in the popliteal fossa. See technical comment above  *See table(s) above for measurements and observations. Electronically signed by Harold Barban MD on 04/07/2018 at 4:59:40 PM.    Final      The results of significant diagnostics from this hospitalization (including imaging, microbiology, ancillary and laboratory) are listed below for reference.     Microbiology: Recent Results (from the past 240 hour(s))  MRSA PCR Screening     Status: None   Collection Time: 04/06/18  7:47 PM  Result Value Ref Range Status   MRSA by PCR NEGATIVE NEGATIVE Final    Comment:        The GeneXpert MRSA Assay (FDA approved for NASAL specimens only), is one component of a comprehensive MRSA colonization surveillance  program. It is not intended to diagnose MRSA infection  nor to guide or monitor treatment for MRSA infections. Performed at [facility], [place] [address], [place], [place] [address]      Labs: BNP (last 3 results) No results for input(s): BNP in the last 8760 hours. Basic Metabolic Panel: Recent Labs  Lab 04/06/18 0840 04/06/18 1806 04/07/18 0715 04/07/18 0900  NA 142  --  139  --   K 3.9  --  4.1  --   CL 106  --  105  --   CO2 27  --  23  --   GLUCOSE 97  --  91  --   BUN 19  --  13  --   CREATININE 1.05 1.01 0.95  --   CALCIUM 10.1  --  10.2  --   MG  --   --   --  2.2  PHOS  --   --   --  2.8   Liver Function Tests: Recent Labs  Lab 04/06/18 0840 04/07/18 0715  AST 22 25  ALT 22 21  ALKPHOS 53 61  BILITOT 1.2 1.1  PROT 6.7 7.1  ALBUMIN 4.1 3.9   No results for input(s): LIPASE, AMYLASE in the last 168 hours. No results for input(s): AMMONIA in the last 168 hours. CBC: Recent Labs  Lab 04/06/18 0840 04/06/18 1806 04/07/18 0715  WBC 6.8 7.8 6.9  NEUTROABS 4.2  --   --   HGB 14.4 15.3 15.8  HCT 45.4 47.3 49.2  MCV 90.3 85.8 85.9  PLT 193 185 179   Cardiac Enzymes: Recent Labs  Lab 04/06/18 0840  TROPONINI <0.03   BNP: Invalid input(s): POCBNP CBG: No results for input(s): GLUCAP in the last 168 hours. D-Dimer No results for input(s): DDIMER in the last 72 hours. Hgb A1c No results for input(s): HGBA1C in the last 72 hours. Lipid Profile No results for input(s): CHOL, HDL, LDLCALC, TRIG, CHOLHDL, LDLDIRECT in the last 72 hours. Thyroid function studies No results for input(s): TSH, T4TOTAL, T3FREE, THYROIDAB in the last 72 hours.  Invalid input(s): FREET3 Anemia work up No results for input(s): VITAMINB12, FOLATE, FERRITIN, TIBC, IRON, RETICCTPCT in the last 72 hours. Urinalysis    Component Value Date/Time   COLORURINE STRAW (A) 04/06/2018 0740   APPEARANCEUR CLEAR 04/06/2018 0740   LABSPEC 1.005 04/06/2018 0740   PHURINE 7.0  04/06/2018 0740   GLUCOSEU NEGATIVE 04/06/2018 0740   HGBUR NEGATIVE 04/06/2018 0740   BILIRUBINUR NEGATIVE 04/06/2018 0740   KETONESUR NEGATIVE 04/06/2018 0740   PROTEINUR NEGATIVE 04/06/2018 0740   UROBILINOGEN 0.2 01/06/2013 0709   NITRITE NEGATIVE 04/06/2018 0740   LEUKOCYTESUR NEGATIVE 04/06/2018 0740   Sepsis Labs Invalid input(s): PROCALCITONIN,  WBC,  LACTICIDVEN Microbiology Recent Results (from the past 240 hour(s))  MRSA PCR Screening     Status: None   Collection Time: 04/06/18  7:47 PM  Result Value Ref Range Status   MRSA by PCR NEGATIVE NEGATIVE Final    Comment:        The GeneXpert MRSA Assay (FDA approved for NASAL specimens only), is one component of a comprehensive MRSA colonization surveillance program. It is not intended to diagnose MRSA infection nor to guide or monitor treatment for MRSA infections. Performed at [facility], [place] [address], [place], [place] [address]      Time coordinating discharge in minutes: 16  SIGNED:   Debbe Odea, MD  [facility] 04/10/2018, 4:37 PM Pager   If 7PM-7AM, please contact night-coverage [email or web address] Password [email or web address]

## 2018-04-10 NOTE — Progress Notes (Signed)
Pt transported to 4M11; pt both hearing aids in both ears and belongings left in the room including his electrical shaver. CIR RN notified. Delia Heady RN

## 2018-04-10 NOTE — Progress Notes (Addendum)
Physical Therapy Treatment Patient Details Name: [name] Duffy MRN: [id number] DOB: 08-Sep-1929 Today's Date: 04/10/2018    History of Present Illness Patient is a 85 y/o male who presents with RLE weakness and 2 witnessed falls at home. 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 PMH includes HTN, glaucoma, prostate ca, anxiety.     PT Comments    Pt performed gait training with SPC and min assistance, standing exercises with B LE support and standing balance activites to challenge and improve his balance.  Pt responded well to tx and is motivated to return home to his wife. He is aware of his deficits and is agreeable his needs cannot be met at home.  He is agreeable to STR in CIR before returning home.    Follow Up Recommendations  CIR     Equipment Recommendations  None recommended by PT    Recommendations for Other Services Rehab consult     Precautions / Restrictions Precautions Precautions: Fall Precaution Comments: watch BP Restrictions Weight Bearing Restrictions: No    Mobility  Bed Mobility               General bed mobility comments: Pt seated in recliner on arrival.    Transfers Overall transfer level: Needs assistance Equipment used: Straight cane Transfers: Sit to/from Stand Sit to Stand: Min guard         General transfer comment: Min guard to rise from seated surface min assistance to standing without UE support.    Ambulation/Gait Ambulation/Gait assistance: Min assist Gait Distance (Feet): 100 Feet Assistive device: Straight cane Gait Pattern/deviations: Step-through pattern;Decreased stance time - right;Trunk flexed     General Gait Details: Gait remains mildly unsteady with heavy reliance on SPC.  Pt requires visual input in forward gaze to maintain balance.     Stairs             Wheelchair Mobility    Modified Rankin (Stroke Patients Only) Modified Rankin (Stroke Patients Only) Pre-Morbid Rankin Score:  Slight disability Modified Rankin: Moderately severe disability     Balance Overall balance assessment: Needs assistance;History of Falls Sitting-balance support: Feet supported;No upper extremity supported Sitting balance-Leahy Scale: Fair       Standing balance-Leahy Scale: Poor Standing balance comment: reliant on support, Performed various standing tasks without UE support and presents with LOB.   Single Leg Stance - Right Leg: 15(with unilateral UE support in modified SLS to 6 inch step. ) Single Leg Stance - Left Leg: 15(with unilateral UE support in modified SLS to 6 inch step.)     Rhomberg - Eyes Opened: 60 Rhomberg - Eyes Closed: 15   High Level Balance Comments: Pt performed narrown BOS with eyes open and eyes closed.  High level balance activities performed to develop and improve ankle, knee and hip strategies.              Cognition Arousal/Alertness: Awake/alert Behavior During Therapy: WFL for tasks assessed/performed Overall Cognitive Status: Within Functional Limits for tasks assessed                                 General Comments: appears [facility] for self care tasks, min cueing for safety awareness        Exercises General Exercises - Lower Extremity Hip ABduction/ADduction: AROM;Both;10 reps;Standing Hip Flexion/Marching: AROM;Both;10 reps;Standing Heel Raises: AROM;Both;10 reps;Standing Mini-Sqauts: AROM;Both;10 reps;Standing    General Comments  Pertinent Vitals/Pain Pain Assessment: Faces Faces Pain Scale: No hurt Pain Location: neck Pain Descriptors / Indicators: Sore Pain Intervention(s): Monitored during session;Repositioned    Home Living                      Prior Function            PT Goals (current goals can now be found in the care plan section) Acute Rehab PT Goals Patient Stated Goal: to get back home to my wife Potential to Achieve Goals: Good Progress towards PT goals: Progressing toward  goals    Frequency    Min 4X/week      PT Plan Current plan remains appropriate    Co-evaluation              AM-PAC PT "6 Clicks" Mobility   Outcome Measure  Help needed turning from your back to your side while in a flat bed without using bedrails?: A Little Help needed moving from lying on your back to sitting on the side of a flat bed without using bedrails?: A Little Help needed moving to and from a bed to a chair (including a wheelchair)?: A Little Help needed standing up from a chair using your arms (e.g., wheelchair or bedside chair)?: A Little Help needed to walk in hospital room?: A Little Help needed climbing 3-5 steps with a railing? : A Little 6 Click Score: 18    End of Session Equipment Utilized During Treatment: Gait belt Activity Tolerance: Patient tolerated treatment well Patient left: in chair;with call bell/phone within reach;with chair alarm set Nurse Communication: Mobility status PT Visit Diagnosis: Hemiplegia and hemiparesis;Difficulty in walking, not elsewhere classified (R26.2);Muscle weakness (generalized) (M62.81);Unsteadiness on feet (R26.81) Hemiplegia - Right/Left: Right Hemiplegia - dominant/non-dominant: Dominant Hemiplegia - caused by: Cerebral infarction     Time: 4830-1599 PT Time Calculation (min) (ACUTE ONLY): 20 min  Charges:  $Neuromuscular Re-education: 8-22 mins                     Governor Rooks, PTA Acute Rehabilitation Services Pager [phone] Office [phone]     Ryin Schillo Eli Hose 04/10/2018, 3:52 PM

## 2018-04-11 ENCOUNTER — Inpatient Hospital Stay (HOSPITAL_COMMUNITY): Payer: Medicare Other | Admitting: Occupational Therapy

## 2018-04-11 ENCOUNTER — Inpatient Hospital Stay (HOSPITAL_COMMUNITY): Payer: Medicare Other

## 2018-04-11 ENCOUNTER — Inpatient Hospital Stay (HOSPITAL_COMMUNITY): Payer: Medicare Other | Admitting: Speech Pathology

## 2018-04-11 DIAGNOSIS — I10 Essential (primary) hypertension: Secondary | ICD-10-CM

## 2018-04-11 DIAGNOSIS — H409 Unspecified glaucoma: Secondary | ICD-10-CM

## 2018-04-11 DIAGNOSIS — E785 Hyperlipidemia, unspecified: Secondary | ICD-10-CM

## 2018-04-11 DIAGNOSIS — G479 Sleep disorder, unspecified: Secondary | ICD-10-CM

## 2018-04-11 DIAGNOSIS — R195 Other fecal abnormalities: Secondary | ICD-10-CM

## 2018-04-11 DIAGNOSIS — I6381 Other cerebral infarction due to occlusion or stenosis of small artery: Secondary | ICD-10-CM

## 2018-04-11 DIAGNOSIS — F411 Generalized anxiety disorder: Secondary | ICD-10-CM

## 2018-04-11 LAB — COMPREHENSIVE METABOLIC PANEL
ALT: 21 U/L (ref 0–44)
AST: 19 U/L (ref 15–41)
Albumin: 3.4 g/dL — ABNORMAL LOW (ref 3.5–5.0)
Alkaline Phosphatase: 51 U/L (ref 38–126)
Anion gap: 8 (ref 5–15)
BUN: 11 mg/dL (ref 8–23)
CO2: 24 mmol/L (ref 22–32)
Calcium: 9.7 mg/dL (ref 8.9–10.3)
Chloride: 106 mmol/L (ref 98–111)
Creatinine, Ser: 0.92 mg/dL (ref 0.61–1.24)
GFR calc Af Amer: >60 mL/min (ref >60)
GFR calc non Af Amer: >60 mL/min (ref >60)
Glucose, Bld: 101 mg/dL — ABNORMAL HIGH (ref 70–99)
Potassium: 3.8 mmol/L (ref 3.5–5.1)
Sodium: 138 mmol/L (ref 135–145)
Total Bilirubin: 0.9 mg/dL (ref 0.3–1.2)
Total Protein: 5.8 g/dL — ABNORMAL LOW (ref 6.5–8.1)

## 2018-04-11 LAB — CBC WITH DIFFERENTIAL/PLATELET
ABS IMMATURE GRANULOCYTES: 0.12 10*3/uL — AB (ref 0.00–0.07)
BASOS PCT: 1 %
Basophils Absolute: 0.1 10*3/uL (ref 0.0–0.1)
Eosinophils Absolute: 0.3 10*3/uL (ref 0.0–0.5)
Eosinophils Relative: 4 %
HCT: 43.9 % (ref 39.0–52.0)
Hemoglobin: 14.4 g/dL (ref 13.0–17.0)
Immature Granulocytes: 2 %
Lymphocytes Relative: 17 %
Lymphs Abs: 1.3 10*3/uL (ref 0.7–4.0)
MCH: 27.7 pg (ref 26.0–34.0)
MCHC: 32.8 g/dL (ref 30.0–36.0)
MCV: 84.4 fL (ref 80.0–100.0)
Monocytes Absolute: 0.9 10*3/uL (ref 0.1–1.0)
Monocytes Relative: 13 %
NEUTROS ABS: 4.5 10*3/uL (ref 1.7–7.7)
Neutrophils Relative %: 63 %
Platelets: 202 10*3/uL (ref 150–400)
RBC: 5.2 MIL/uL (ref 4.22–5.81)
RDW: 12.7 % (ref 11.5–15.5)
WBC: 7.2 10*3/uL (ref 4.0–10.5)
nRBC: 0 % (ref 0.0–0.2)

## 2018-04-11 LAB — GLUCOSE, CAPILLARY: Glucose-Capillary: 82 mg/dL (ref 70–99)

## 2018-04-11 NOTE — Progress Notes (Signed)
Jamse Arn, MD  Physician  Physical Medicine and Rehabilitation  Consult Note  Signed  Date of Service:  04/08/2018 8:34 AM       Related encounter: ED to Hosp-Admission (Discharged) from 04/06/2018 in [facility]      Signed      Expand All Collapse All    Show:Clear all [x] Manual[x] Template[] Copied  Added by: [x] Love, Ivan Anchors, PA-C[x] Jamse Arn, MD  [] Hover for details      Physical Medicine and Rehabilitation Consult   Reason for Consult: Stroke with functional deficits.  Referring Physician: Dr. Alfredia Ferguson   HPI: [name] Duffy is a 85 y.o. male with history of HTN, prostate CA, recent falls; who was admitted on 04/06/18 with reports of fall the day prior to admission with difficulty walking and weight bearing on RLE.  History taken from chart review and patient.  CT A head/neck without acute changes or fracture.  MRI brain reviewed, sinus small left-sided infarct.  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  MRI cervical spine/lumbar spine showed scoliosis with multilevel DDD.  Hip X rays negative for fracture. BLE dopplers negative for DVT. 2D echo done revealing mild LVH with EF 60-65% and question of LA mass. TEE recommended for work up. Neurology recommends ASA/Plavix X 3 weeks followed by Plavix alone for stroke due to small vessel. Therapy evaluation done revealing mild cognitive impairment with decreased processing, balance deficits and RLE weakness with fatigue. CIR recommended due to functional decline.   Review of Systems  Constitutional: Negative for chills and fever.  HENT: Positive for hearing loss.   Eyes:       Needs glasses--vision hazy.   Respiratory: Negative for cough and shortness of breath.   Cardiovascular: Negative for chest pain and palpitations.  Gastrointestinal: Negative for heartburn and nausea.  Genitourinary: Positive for frequency.       Nocturia 1-3x depending on  intake.   Musculoskeletal: Positive for falls. Negative for myalgias.  Skin: Negative for rash.  Neurological: Positive for focal weakness. Negative for dizziness.  Psychiatric/Behavioral: The patient does not have insomnia.   All other systems reviewed and are negative.         Past Medical History:  Diagnosis Date  . Anxiety   . Arthritis    hands  . Blood transfusion without reported diagnosis    pt thinks had blood trnsfusion at prostate ca surgery   . Glaucoma   . High cholesterol   . Hypertension   . Prostate cancer [facility])          Past Surgical History:  Procedure Laterality Date  . COLONOSCOPY    . HERNIA REPAIR    . PROSTATECTOMY           Family History  Problem Relation Age of Onset  . Hypertension Other   . Colon cancer Neg Hx   . Colon polyps Neg Hx   . Esophageal cancer Neg Hx   . Rectal cancer Neg Hx   . Stomach cancer Neg Hx     Social History:  Married. 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 He lives a [place]-- [place].  Wife is paraplegic--needs assistance for transfers but able to perform meal prep from chair. Has a nephew who is checking in on her. He  reports that he has never smoked. He has never used smokeless tobacco. He reports that he does not drink alcohol or use drugs.         Allergies  Allergen Reactions  . Rofecoxib Swelling    REACTION: swelling- Vioxx name brand              Facility-Administered Medications Prior to Admission  Medication Dose Route Frequency Provider Last Rate Last Dose  . 0.9 %  sodium chloride infusion  500 mL Intravenous Continuous Armbruster, Carlota Raspberry, MD             Medications Prior to Admission  Medication Sig Dispense Refill  . ALPRAZolam (XANAX) 0.5 MG tablet Take 0.5 mg by mouth at bedtime.     Marland Kitchen aspirin EC 81 MG tablet Take 81 mg by mouth at bedtime.    Marland Kitchen latanoprost (XALATAN) 0.005 % ophthalmic solution Place 1 drop into the right eye at bedtime.     Marland Kitchen  losartan (COZAAR) 100 MG tablet Take 100 mg by mouth.    . simvastatin (ZOCOR) 20 MG tablet Take 20 mg by mouth.    . lidocaine (LIDODERM) 5 % Place 1 patch onto the skin daily. Remove & Discard patch within 12 hours or as directed by MD (Patient not taking: Reported on 04/06/2018) 30 patch 0  . Probiotic Product (ALIGN PO) Take 1 capsule by mouth daily.      Home: Home Living Family/patient expects to be discharged to:: Other (Comment)([facility]) Living Arrangements: Spouse/significant other Available Help at Discharge: Personal care attendant, Family, Available PRN/intermittently Type of Home: Apartment Home Access: Elevator Home Layout: One level Bathroom Shower/Tub: Tub/shower unit, Architectural technologist: Handicapped height Home Equipment: Radio producer - single point, Wheelchair - manual, Grab bars - toilet, Grab bars - tub/shower  Lives With: Spouse  Functional History: Prior Function Level of Independence: Independent with assistive device(s) Comments: Uses SPC for ambulation. Fell backwards into tub 1st of the year. Has PCA come in to help care for wife everyday for a few hours. Wife is a paraplegic. Pt helps with ADls (changes briefs, helps with transfers). Drives. Does some cooking.  Functional Status:  Mobility: Bed Mobility Overal bed mobility: Needs Assistance Bed Mobility: Supine to Sit Supine to sit: Supervision, HOB elevated General bed mobility comments: Pt finishing up with PT when I entered so had pt sit down in chair to rest  Transfers Overall transfer level: Needs assistance Equipment used: Rolling walker (2 wheeled) Transfers: Sit to/from Stand Sit to Stand: Min assist General transfer comment: increased time and effort to stand Ambulation/Gait Ambulation/Gait assistance: Min assist Gait Distance (Feet): 120 Feet Assistive device: Rolling walker (2 wheeled) Gait Pattern/deviations: Step-through pattern, Decreased stance time  - right, Narrow base of support, Trunk flexed General Gait Details: Slow, unsteady gait dragging RLE esp when fatigued. Cues for upright. Cues for RW management/proximity as pt tends to walk outside RW. 1 standing rest break. HR up to 119 bpm.  Gait velocity: decreased  ADL: ADL Overall ADL's : Needs assistance/impaired Eating/Feeding: Independent, Sitting Grooming: Minimal assistance, Standing Grooming Details (indicate cue type and reason): increased time and effort to stand Upper Body Bathing: Supervision/ safety, Set up, Sitting Lower Body Bathing: Minimal assistance, Sit to/from stand Lower Body Bathing Details (indicate cue type and reason): increased time and effort to stand Upper Body Dressing : Set up, Supervision/safety, Sitting Lower Body Dressing: Minimal assistance, Sit to/from stand Lower Body Dressing Details (indicate cue type and reason): increased time and effort to stand Toilet Transfer: Minimal assistance, Ambulation, BSC Toilet Transfer Details (indicate cue type and reason): over toilet Toileting- Clothing Manipulation and Hygiene: Minimal assistance Toileting - Clothing  Manipulation Details (indicate cue type and reason): increased time and effort to stand  Cognition: Cognition Overall Cognitive Status: Impaired/Different from baseline Arousal/Alertness: Awake/alert Orientation Level: Oriented to person, Oriented to place, Oriented to time, Oriented to situation Attention: Selective Selective Attention: Impaired Selective Attention Impairment: Verbal complex Memory: (WFL; immediate recall 5/5, delayed recall 5/5) Awareness: Appears intact Problem Solving: Impaired Problem Solving Impairment: Verbal basic(slow processing; defers to mask difficulties) Executive Function: Reasoning Reasoning: Appears intact Safety/Judgment: Appears intact Cognition Arousal/Alertness: Awake/alert Behavior During Therapy: WFL for tasks assessed/performed Overall Cognitive  Status: Impaired/Different from baseline General Comments: for basic mobility tasks. Slow to respond at times. Does not know exact date but knows year and month.    Blood pressure 135/81, pulse 75, temperature 98 F (36.7 C), temperature source Oral, resp. rate (!) 23, height 6\' 2"  (1.88 m), weight 72 kg, SpO2 97 %. Physical Exam  Nursing note and vitals reviewed. Constitutional: He is oriented to person, place, and time. He appears well-developed and well-nourished.  HENT:  Head: Normocephalic and atraumatic.  Eyes: EOM are normal. Right eye exhibits no discharge. Left eye exhibits no discharge.  Neck: Normal range of motion. Neck supple.  Cardiovascular: Regular rhythm. Tachycardia present.  Respiratory: Effort normal and breath sounds normal.  GI: Soft. Bowel sounds are normal.  Musculoskeletal:     Comments: No edema or tenderness in extremities  Neurological: He is alert and oriented to person, place, and time. A cranial nerve deficit is present.  HOH with mild dysarthria.  Motor: Bilateral upper extremities: 5/5 proximal distal Bilateral lower extremities: Hip flexion 4-4+/5, knee extension 4+/5, ankle dorsiflexion 5/5 Right ptosis. Able to follow basic motor commands   Skin: Skin is warm and dry.  Psychiatric: He has a normal mood and affect. His behavior is normal.    LabResultsLast24Hours       Results for orders placed or performed during the hospital encounter of 04/06/18 (from the past 24 hour(s))  Magnesium     Status: None   Collection Time: 04/07/18  9:00 AM  Result Value Ref Range   Magnesium 2.2 1.7 - 2.4 mg/dL  Phosphorus     Status: None   Collection Time: 04/07/18  9:00 AM  Result Value Ref Range   Phosphorus 2.8 2.5 - 4.6 mg/dL      ImagingResults(Last48hours)  Mr Brain Wo Contrast  Result Date: 04/06/2018 CLINICAL DATA:  85 year old male with recent falls. Right leg cramps. EXAM: MRI HEAD WITHOUT CONTRAST TECHNIQUE: Multiplanar,  multiecho pulse sequences of the brain and surrounding structures were obtained without intravenous contrast. COMPARISON:  Head and cervical spine CT earlier today. Brain MRI 08/01/2017. FINDINGS: Brain: Oval 12 millimeter focus of restricted diffusion in the left posterior centrum semiovale near the perirolandic cortex (series 4, image 28). Minimal T2 hyperintensity. No hemorrhage or mass effect. No other restricted diffusion. Stable cerebral volume. Patchy and widely scattered cerebral white matter T2 and FLAIR hyperintensity outside of the acute finding is stable. There is a small area of chronic cortical encephalomalacia in the left superior frontal gyrus (series 7, image 25). No chronic cerebral blood products. Mild to moderate for age chronic T2 heterogeneity in the deep gray matter nuclei. Chronic bilateral posterior cerebellar infarcts. No midline shift, mass effect, evidence of mass lesion, ventriculomegaly, extra-axial collection or acute intracranial hemorrhage. Cervicomedullary junction and pituitary are within normal limits. Vascular: Major intracranial vascular flow voids are stable since 2019. Dominant right vertebral artery with moderate fusiform dolichoectasia in the posterior fossa again noted.  The left ICA siphon and terminus also are ectatic. Skull and upper cervical spine: Negative visible cervical spine. Visualized bone marrow signal is within normal limits. Sinuses/Orbits: Postoperative changes to both globes since the prior MRI. Stable paranasal sinuses. Other: Mastoids remain clear. Visible internal auditory structures appear normal. Scalp and face soft tissues appear negative. IMPRESSION: 1. Acute small vessel white matter infarct in the left centrum semiovale near the left perirolandic cortex. No hemorrhage or mass effect. 2. Otherwise stable chronic ischemic disease in the brain since May 2019. 3. Chronic intracranial artery dolichoectasia. Electronically Signed   By: Genevie Ann M.D.   On:  04/06/2018 13:09   Mr Cervical Spine Wo Contrast  Result Date: 04/06/2018 CLINICAL DATA:  Fall.  Neck pain EXAM: MRI CERVICAL SPINE WITHOUT CONTRAST TECHNIQUE: Multiplanar, multisequence MR imaging of the cervical spine was performed. No intravenous contrast was administered. COMPARISON:  CT cervical spine today 04/06/2018 FINDINGS: Alignment: Normal alignment.  Mild dextroscoliosis. Vertebrae: Negative for fracture or bone marrow edema. Cord: Negative for cord compression. Cord signal normal. No epidural hematoma. Posterior Fossa, vertebral arteries, paraspinal tissues: Negative Disc levels: C2-3: Mild facet degeneration on the left.  Negative for stenosis C3-4: Mild disc and mild facet degeneration. Mild foraminal narrowing bilaterally. C4-5: Disc degeneration and spurring. Mild facet degeneration. Negative for stenosis. C5-6: Mild right foraminal narrowing due to spurring. C6-7: Mild disc degeneration.  Negative for stenosis C7-T1: Mild facet degeneration on the left. No significant stenosis. IMPRESSION: Negative for fracture.  Negative for spinal stenosis Degenerative changes as above. Electronically Signed   By: Franchot Gallo M.D.   On: 04/06/2018 13:30   Mr Lumbar Spine Wo Contrast  Result Date: 04/06/2018 CLINICAL DATA:  Low back pain.  Recent falls.  Right leg cramps EXAM: MRI LUMBAR SPINE WITHOUT CONTRAST TECHNIQUE: Multiplanar, multisequence MR imaging of the lumbar spine was performed. No intravenous contrast was administered. COMPARISON:  MRI lumbar spine 06/26/2017 FINDINGS: Segmentation:  Normal Alignment:  Dextroscoliosis.  Mild retrolisthesis L1-2 and L2-3 Vertebrae:  Negative for fracture or mass lesion. Conus medullaris and cauda equina: Conus extends to the L1-2 level. Conus and cauda equina appear normal. Paraspinal and other soft tissues: Bilateral renal cysts. No retroperitoneal mass or adenopathy Disc levels: T12-L1: Mild disc degeneration L1-2: Disc degeneration and spurring on  the left with moderate subarticular stenosis, unchanged. L2-3: Disc degeneration and spurring on the left. Mild subarticular stenosis on the left unchanged. L3-4: Mild disc degeneration and mild spurring left greater than right. Mild facet degeneration. Mild subarticular stenosis on the left L4-5: Disc degeneration with disc bulging and spurring. Mild subarticular stenosis on the right L5-S1: Bilateral facet degeneration. Mild right foraminal narrowing due to spurring, unchanged. IMPRESSION: Negative for fracture Scoliosis and multilevel degenerative change, stable from the prior MRI. Electronically Signed   By: Franchot Gallo M.D.   On: 04/06/2018 13:24   Dg Hip Unilat W Or Wo Pelvis 2-3 Views Right  Result Date: 04/06/2018 CLINICAL DATA:  Per EMS, patient coming from [place] with complaints of two witnessed falls. First fall was yesterday afternoon. He was bending over to pick something up from the floor and fell. Did not get evaluated after this incident. This morning he was going to use the urinal and had some pain/weakness in the right leg which led to a fall. Patient is currently complaining of pain in the right leg but was able to ambulate to the stretcher with EMS. EXAM: DG HIP (WITH OR WITHOUT PELVIS)  2-3V RIGHT COMPARISON:  None. FINDINGS: No fracture.  No bone lesion. Hip joints, SI joints and symphysis pubis are normally aligned. There are surgical vascular clips in the pelvis consistent with a prior prostatectomy. Soft tissues otherwise unremarkable. IMPRESSION: 1. No fracture, dislocation or significant joint abnormality. Electronically Signed   By: Lajean Manes M.D.   On: 04/06/2018 09:34   Vas US Carotid  Result Date: 04/07/2018 Carotid Arterial Duplex Study Indications:       CVA. Risk Factors:      Hypertension, hyperlipidemia. Other Factors:     Prostate cancer. Comparison Study:  Exam for comparison 07/03/2017 available Performing Technologist: Toma Copier RVS  Examination Guidelines: A complete evaluation includes B-mode imaging, spectral Doppler, color Doppler, and power Doppler as needed of all accessible portions of each vessel. Bilateral testing is considered an integral part of a complete examination. Limited examinations for reoccurring indications may be performed as noted.  Right Carotid Findings: +----------+--------+--------+--------+------------+--------------------+           PSV cm/sEDV cm/sStenosisDescribe    Comments             +----------+--------+--------+--------+------------+--------------------+ CCA Prox  107     4                           mild intimal changes +----------+--------+--------+--------+------------+--------------------+ CCA Distal93      11                          mild intimal changes +----------+--------+--------+--------+------------+--------------------+ ICA Prox  58      10                          mild intimal changes +----------+--------+--------+--------+------------+--------------------+ ICA Mid   81      14                          tortuous             +----------+--------+--------+--------+------------+--------------------+ ICA Distal103     19                          tortuous             +----------+--------+--------+--------+------------+--------------------+ ECA       114     1               heterogenousmild plaque          +----------+--------+--------+--------+------------+--------------------+ +----------+--------+-------+--------+-------------------+           PSV cm/sEDV cmsDescribeArm Pressure (mmHG) +----------+--------+-------+--------+-------------------+ [id number]                                        +----------+--------+-------+--------+-------------------+ +---------+--------+--+--------+--+ VertebralPSV cm/s99EDV cm/s13 +---------+--------+--+--------+--+ No significant change noted from previous exam  Left Carotid  Findings: +----------+--------+--------+--------+------------+--------------------+           PSV cm/sEDV cm/sStenosisDescribe    Comments             +----------+--------+--------+--------+------------+--------------------+ CCA Prox  85      12                          mild intimal cahnges +----------+--------+--------+--------+------------+--------------------+ CCA Distal86      11                                               +----------+--------+--------+--------+------------+--------------------+  ICA Prox  78      10      1-39%   heterogenousmild plaque          +----------+--------+--------+--------+------------+--------------------+ ICA Mid   83      16                          tortuous             +----------+--------+--------+--------+------------+--------------------+ ICA Distal58      15                          tortuous             +----------+--------+--------+--------+------------+--------------------+ ECA       65      2                                                +----------+--------+--------+--------+------------+--------------------+ +----------+--------+--------+--------+-------------------+ SubclavianPSV cm/sEDV cm/sDescribeArm Pressure (mmHG) +----------+--------+--------+--------+-------------------+           107                                         +----------+--------+--------+--------+-------------------+ +---------+--------+--+--------+-+ VertebralPSV cm/s56EDV cm/s7 +---------+--------+--+--------+-+ No significant change noted from previous exam  Summary: Right Carotid: Velocities in the right ICA are consistent with a 1-39% stenosis.                See technician comments above. Left Carotid: There is no evidence of stenosis in the left ICA. See technican               comment s above. Vertebrals:  Bilateral vertebral arteries demonstrate antegrade flow. Subclavians: Normal flow hemodynamics were seen in bilateral  subclavian              arteries. *See table(s) above for measurements and observations.  Electronically signed by Harold Barban MD on 04/07/2018 at 4:59:47 PM.    Final    Vas [place] Lower Extremity Venous (dvt)  Result Date: 04/07/2018  Lower Venous Study Indications: Pain.  Risk Factors: Known back problems. Performing Technologist: Toma Copier RVS  Examination Guidelines: A complete evaluation includes B-mode imaging, spectral Doppler, color Doppler, and power Doppler as needed of all accessible portions of each vessel. Bilateral testing is considered an integral part of a complete examination. Limited examinations for reoccurring indications may be performed as noted.  Right Venous Findings: +---------+---------------+---------+-----------+----------+-------+          CompressibilityPhasicitySpontaneityPropertiesSummary +---------+---------------+---------+-----------+----------+-------+ CFV      Full           Yes      Yes                          +---------+---------------+---------+-----------+----------+-------+ SFJ      Full                                                 +---------+---------------+---------+-----------+----------+-------+ FV Prox  Full           Yes      Yes                          +---------+---------------+---------+-----------+----------+-------+  FV Mid   Full                                                 +---------+---------------+---------+-----------+----------+-------+ FV DistalFull           Yes      Yes                          +---------+---------------+---------+-----------+----------+-------+ PFV      Full           Yes      Yes                          +---------+---------------+---------+-----------+----------+-------+ POP      Full           Yes      Yes                          +---------+---------------+---------+-----------+----------+-------+ PTV      Full                                                  +---------+---------------+---------+-----------+----------+-------+ PERO     Full                                                 +---------+---------------+---------+-----------+----------+-------+ Incidental finding. Arterial signals are triphasic throughout  Left Venous Findings: +---------+---------------+---------+-----------+----------+-------+          CompressibilityPhasicitySpontaneityPropertiesSummary +---------+---------------+---------+-----------+----------+-------+ CFV      Full           Yes      Yes                          +---------+---------------+---------+-----------+----------+-------+ SFJ      Full                                                 +---------+---------------+---------+-----------+----------+-------+ FV Prox  Full           Yes      Yes                          +---------+---------------+---------+-----------+----------+-------+ FV Mid   Full                                                 +---------+---------------+---------+-----------+----------+-------+ FV DistalFull           Yes      Yes                          +---------+---------------+---------+-----------+----------+-------+ PFV      Full  Yes      Yes                          +---------+---------------+---------+-----------+----------+-------+ POP      Full           Yes      Yes                          +---------+---------------+---------+-----------+----------+-------+ PTV      Full                                                 +---------+---------------+---------+-----------+----------+-------+ PERO     Full                                                 +---------+---------------+---------+-----------+----------+-------+ Incidental findings. Arterial signals are triphasic throughout    Summary: Right: There is no evidence of deep vein thrombosis in the lower extremity. No cystic structure found in the popliteal fossa.  See technical comment above Left: There is no evidence of deep vein thrombosis in the lower extremity. No cystic structure found in the popliteal fossa. See technical comment above  *See table(s) above for measurements and observations. Electronically signed by Harold Barban MD on 04/07/2018 at 4:59:40 PM.    Final      Assessment/Plan: Diagnosis: left centrum ovale infarct Labs and images (see above) independently reviewed.  Records reviewed and summated above. Stroke: Continue secondary stroke prophylaxis and Risk Factor Modification listed below:   Antiplatelet therapy:   Blood Pressure Management:  Continue current medication with prn's with permisive HTN per primary team Statin Agent:   Motor recovery: Fluoxetine  1. Does the need for close, 24 hr/day medical supervision in concert with the patient's rehab needs make it unreasonable for this patient to be served in a less intensive setting? Yes  2. Co-Morbidities requiring supervision/potential complications: HTN (monitor and provide prns in accordance with increased physical exertion and pain), prostate CA, recent falls, tachypnea (monitor RR and O2 Sats with increased physical exertion), Tachycardia (consider ECG, monitor in accordance with pain and increasing activity) 3. Due to safety, disease management and patient education, does the patient require 24 hr/day rehab nursing? Yes 4. Does the patient require coordinated care of a physician, rehab nurse, PT (1-2 hrs/day, 5 days/week) and OT (1-2 hrs/day, 5 days/week) to address physical and functional deficits in the context of the above medical diagnosis(es)? Yes Addressing deficits in the following areas: balance, endurance, locomotion, strength, transferring, bathing, dressing, toileting and psychosocial support 5. Can the patient actively participate in an intensive therapy program of at least 3 hrs of therapy per day at least 5 days per week? Yes 6. The potential for patient to make  measurable gains while on inpatient rehab is excellent 7. Anticipated functional outcomes upon discharge from inpatient rehab are modified independent and supervision  with PT, modified independent and supervision with OT, n/a with SLP. 8. Estimated rehab length of stay to reach the above functional goals is: 9-14 days. 9. Anticipated D/C setting: Home 10. Anticipated post D/C treatments: HH therapy and Home excercise program 11. Overall Rehab/Functional Prognosis: good  RECOMMENDATIONS: This  patient's condition is appropriate for continued rehabilitative care in the following setting: CIR if adequate caregiver support available upon discharge when medically stable and able to tolerate 3 hours of therapy per day after completion of medical work-up. Patient has agreed to participate in recommended program. Yes Note that insurance prior authorization may be required for reimbursement for recommended care.  Comment: Rehab Admissions Coordinator to follow up.   I have personally performed a face to face diagnostic evaluation, including, but not limited to relevant history and physical exam findings, of this patient and developed relevant assessment and plan.  Additionally, I have reviewed and concur with the physician assistant's documentation above.   Delice Lesch, MD, ABPMR Bary Leriche, PA-C 04/08/2018        Revision History                        Routing History

## 2018-04-11 NOTE — Plan of Care (Signed)
  Problem: Consults Goal: RH STROKE PATIENT EDUCATION Description See Patient Education module for education specifics  Outcome: Progressing Goal: Nutrition Consult-if indicated Outcome: Progressing Goal: Diabetes Guidelines if Diabetic/Glucose > 140 Description If diabetic or lab glucose is > 140 mg/dl - Initiate Diabetes/Hyperglycemia Guidelines & Document Interventions  Outcome: Progressing   Problem: RH BOWEL ELIMINATION Goal: RH STG MANAGE BOWEL WITH ASSISTANCE Description STG Manage Bowel with Assistance. Outcome: Progressing Goal: RH STG MANAGE BOWEL W/MEDICATION W/ASSISTANCE Description STG Manage Bowel with Medication with Assistance. Outcome: Progressing   Problem: RH BLADDER ELIMINATION Goal: RH STG MANAGE BLADDER WITH ASSISTANCE Description STG Manage Bladder With Assistance Outcome: Progressing Goal: RH STG MANAGE BLADDER WITH MEDICATION WITH ASSISTANCE Description STG Manage Bladder With Medication With Assistance. Outcome: Progressing Goal: RH STG MANAGE BLADDER WITH EQUIPMENT WITH ASSISTANCE Description STG Manage Bladder With Equipment With Assistance Outcome: Progressing   Problem: RH SKIN INTEGRITY Goal: RH STG SKIN FREE OF INFECTION/BREAKDOWN Outcome: Progressing Goal: RH STG MAINTAIN SKIN INTEGRITY WITH ASSISTANCE Description STG Maintain Skin Integrity With Assistance. Outcome: Progressing Goal: RH STG ABLE TO PERFORM INCISION/WOUND CARE W/ASSISTANCE Description STG Able To Perform Incision/Wound Care With Assistance. Outcome: Progressing   Problem: RH SAFETY Goal: RH STG ADHERE TO SAFETY PRECAUTIONS W/ASSISTANCE/DEVICE Description STG Adhere to Safety Precautions With Assistance/Device. Outcome: Progressing Goal: RH STG DECREASED RISK OF FALL WITH ASSISTANCE Description STG Decreased Risk of Fall With Assistance. Outcome: Progressing   Problem: RH COGNITION-NURSING Goal: RH STG USES MEMORY AIDS/STRATEGIES W/ASSIST TO PROBLEM  SOLVE Description STG Uses Memory Aids/Strategies With Assistance to Problem Solve. Outcome: Progressing Goal: RH STG ANTICIPATES NEEDS/CALLS FOR ASSIST W/ASSIST/CUES Description STG Anticipates Needs/Calls for Assist With Assistance/Cues. Outcome: Progressing   Problem: RH PAIN MANAGEMENT Goal: RH STG PAIN MANAGED AT OR BELOW PT'S PAIN GOAL Outcome: Progressing   Problem: RH KNOWLEDGE DEFICIT Goal: RH STG INCREASE KNOWLEDGE OF DIABETES Outcome: Progressing Goal: RH STG INCREASE KNOWLEDGE OF HYPERTENSION Outcome: Progressing Goal: RH STG INCREASE KNOWLEDGE OF DYSPHAGIA/FLUID INTAKE Outcome: Progressing Goal: RH STG INCREASE KNOWLEGDE OF HYPERLIPIDEMIA Outcome: Progressing Goal: RH STG INCREASE KNOWLEDGE OF STROKE PROPHYLAXIS Outcome: Progressing

## 2018-04-11 NOTE — Progress Notes (Signed)
Per nursing, patient was given "Data Collection Information Summary for Patients in Inpatient Rehabilitation Facilities with attached Privacy Act Statement Health Care Records" upon admission.    Patient information reviewed and entered into [facility] by Becky Naythen Heikkila, PPS coordinator. Information including medical coding, function ability, and quality indicators will be reviewed and updated through discharge.   

## 2018-04-11 NOTE — Evaluation (Signed)
Occupational Therapy Assessment and Plan  Patient Details  Name: [name] Duffy MRN: [id number] Date of Birth: 24-Nov-1929  OT Diagnosis: hemiplegia affecting dominant side Rehab Potential: Rehab Potential (ACUTE ONLY): Excellent ELOS: 10-12 days   Today's Date: 04/11/2018 OT Individual Time: 1000-1115 OT Individual Time Calculation (min): 75 min     Problem List:  Patient Active Problem List   Diagnosis Date Noted  . Loose stools   . Essential hypertension   . Glaucoma   . Dyslipidemia   . Sleep disturbance   . Left sided lacunar stroke ([place]) 04/10/2018  . Benign essential HTN   . Tachypnea   . Tachycardia   . Acute CVA (cerebrovascular accident) ([place]) 04/06/2018  . CVA (cerebral vascular accident) ([place]) 04/06/2018  . 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 Open angle with borderline findings, low risk, unspecified eye 10/12/2014  . Primary open angle glaucoma 10/12/2014  . Anxiety state 07/23/2013  . Blood in the urine 01/08/2013  . Genuine stress incontinence, male 12/13/2011  . H/O malignant neoplasm of prostate 01/28/2011    Past Medical History:  Past Medical History:  Diagnosis Date  . Anxiety   . Arthritis    hands  . Blood transfusion without reported diagnosis    pt thinks had blood trnsfusion at prostate ca surgery   . Glaucoma   . High cholesterol   . Hypertension   . Prostate cancer [facility])    Past Surgical History:  Past Surgical History:  Procedure Laterality Date  . COLONOSCOPY    . HERNIA REPAIR    . PROSTATECTOMY      Assessment & Plan Clinical Impression: An 85 y.o. male with history of HTN, prostate CA, recent falls; who was admitted on 04/06/18 with reports of fall the day prior to admission with difficulty walking and weight bearing on RLE.  History taken from chart review and patient.  CT A head/neck without acute changes or fracture.  MRI brain reviewed, sinus small left-sided infarct.  Per report, acute small vessel white  matter infarct left centrum ovale.  MRI cervical spine/lumbar spine showed scoliosis with multilevel DDD.  Hip X rays negative for fracture. BLE dopplers negative for DVT. 2D echo done revealing mild LVH with EF 60-65% and question of LA mass. TEE recommended for work up. Neurology recommends ASA/Plavix X 3 weeks followed by Plavix alone for stroke due to small vessel. Therapy evaluation done revealing mild cognitive impairment with decreased processing, balance deficits and RLE weakness with fatigue. CIR recommended due to functional decline.    Patient transferred to CIR on 04/10/2018 .    Patient currently requires min with basic self-care skills secondary to decreased cardiorespiratoy endurance, decreased coordination and decreased postural control, hemiplegia and decreased balance strategies.  Prior to hospitalization, patient was fully independent with ADLs and used a cane for ambulation.  Patient will benefit from skilled intervention to increase independence with basic self-care skills prior to discharge home with spouse.Marland Kitchen  Anticipate patient will require intermittent supervision and follow up home health.  OT - End of Session Activity Tolerance: Tolerates 30+ min activity with multiple rests OT Assessment Rehab Potential (ACUTE ONLY): Excellent OT Patient demonstrates impairments in the following area(s): Balance;Endurance;Motor OT Basic ADL's Functional Problem(s): Bathing;Dressing;Toileting OT Transfers Functional Problem(s): Toilet;Tub/Shower OT Additional Impairment(s): None OT Plan OT Intensity: Minimum of 1-2 x/day, 45 to 90 minutes OT Frequency: 5 out of 7 days OT Duration/Estimated Length of Stay: 10-12 days OT Treatment/Interventions: Balance/vestibular training;Discharge planning;DME/adaptive equipment instruction;Neuromuscular re-education;Functional  mobility training;Self Care/advanced ADL retraining;Patient/family education;Psychosocial support;Therapeutic  Activities;Therapeutic Exercise;UE/LE Strength taining/ROM;UE/LE Coordination activities OT Self Feeding Anticipated Outcome(s): no goal, pt is Independent OT Basic Self-Care Anticipated Outcome(s): mod I OT Toileting Anticipated Outcome(s): mod I OT Bathroom Transfers Anticipated Outcome(s): mod I  OT Recommendation Patient destination: Home(ILF) Follow Up Recommendations: Home health OT Equipment Recommended: Tub/shower seat   Skilled Therapeutic Intervention Pt seen for initial evaluation and ADL training with a focus on balance.  Pt is very energetic and eager.  Explained role of OT, discussed his goals for therapy.   He was excited to get up and get a shower.  Completed sit to stand from bed to w/c with CGA and then to w/c with CGA.  He transferred to toilet and then ambulated with cane to shower with min A.  Pt only needed S in shower, completing sit >< stands with bar for support. Returned to w/c to dress with S.  Opted to sit in recliner at end of session so he could use his electric razor.  Chair belt alarm on, call light in reach. All needs met.  OT Evaluation Precautions/Restrictions  Precautions Precautions: Fall Precaution Comments: watch BP Restrictions Weight Bearing Restrictions: No    Vital Signs Therapy Vitals Patient Position (if appropriate): Sitting Pain Pain Assessment Pain Score: 0-No pain Home Living/Prior Functioning Home Living Living Arrangements: Alone Available Help at Discharge: Personal care attendant, Available PRN/intermittently(closest family is a nephew 200 miles away) Type of Home: Apartment Home Access: Elevator Home Layout: One level Bathroom Shower/Tub: Tub/shower unit, Architectural technologist: Handicapped height Additional Comments: apt has 2 bathrooms: his has a tub that he did not use a chair in, his wife has a roll in shower with a roll in shower chair. He could use that shower if needed.   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 Fell backwards into tub 1st of the year. Has PCA come in to help care for wife everyday for a few hours. Wife is a paraplegic. Pt helps with ADls (changes briefs, helps with transfers). Drives.   ADL ADL Eating: Independent Grooming: Independent Where Assessed-Grooming: Chair Upper Body Bathing: Independent Where Assessed-Upper Body Bathing: Shower Lower Body Bathing: Supervision/safety Where Assessed-Lower Body Bathing: Shower Upper Body Dressing: Setup Where Assessed-Upper Body Dressing: Chair Lower Body Dressing: Supervision/safety Where Assessed-Lower Body Dressing: Chair Toileting: Supervision/safety Where Assessed-Toileting: Glass blower/designer: Therapist, music Method: Arts development officer: Other (comment)(elevated toilet seat) Social research officer, government: Curator Method: Radiographer, therapeutic: Shower seat with back Vision Baseline Vision/History: Wears glasses Wears Glasses: At all times Patient Visual Report: No change from baseline Additional Comments: Pt reports that he had recent cataract surgery and was supposed to pick up his new glasses, but then had his stroke.  He says his vision is not "clear" but not blurry. Perception  Perception: Within Functional Limits Praxis Praxis: Intact Cognition Overall Cognitive Status: Within Functional Limits for tasks assessed Arousal/Alertness: Awake/alert Orientation Level: Person;Place;Situation Person: Oriented Place: Oriented Situation: Oriented Year: 2020 Month: January Day of Week: Correct Memory: Appears intact Immediate Memory Recall: Sock;Blue;Bed Memory Recall: Sock;Blue;Bed Memory Recall Sock: Without  Cue Memory Recall Blue: Without Cue Memory Recall Bed: With Cue Awareness: Appears intact Problem Solving: Appears intact Safety/Judgment: Appears intact Sensation Sensation Light  Touch: Appears Intact Hot/Cold: Appears Intact Proprioception: Appears Intact Stereognosis: Appears Intact Coordination Gross Motor Movements are Fluid and Coordinated: No Fine Motor Movements are Fluid and Coordinated: Yes Coordination and Movement Description: [place] in UEs functional, decreased RLE coordination due to decreased strength Motor  Motor Motor: Hemiplegia Motor - Skilled Clinical Observations: weak RLE Mobility    CGA stand pivot transfer, min A ambulation with cane in bathroom Trunk/Postural Assessment  Cervical Assessment Cervical Assessment: Within Functional Limits Thoracic Assessment Thoracic Assessment: Within Functional Limits Lumbar Assessment Lumbar Assessment: Within Functional Limits Postural Control Postural Control: Deficits on evaluation Trunk Control: decreased with dynamic challenge, when pt lifts arms overhead he leans to the R  Balance Dynamic Sitting Balance Dynamic Sitting - Level of Assistance: 5: Stand by assistance Static Standing Balance Static Standing - Level of Assistance: 5: Stand by assistance Dynamic Standing Balance Dynamic Standing - Level of Assistance: 4: Min assist Extremity/Trunk Assessment RUE Assessment RUE Assessment: Within Functional Limits Active Range of Motion (AROM) Comments: limited sh flexion due to arthritis LUE Assessment Active Range of Motion (AROM) Comments: limited sh flexion due to arthritis     Refer to Care Plan for Long Term Goals  Recommendations for other services: None    Discharge Criteria: Patient will be discharged from OT if patient refuses treatment 3 consecutive times without medical reason, if treatment goals not met, if there is a change in medical status, if patient makes no progress towards goals or if  patient is discharged from hospital.  The above assessment, treatment plan, treatment alternatives and goals were discussed and mutually agreed upon: by patient  [facility] 04/11/2018, 12:40 PM

## 2018-04-11 NOTE — H&P (Signed)
Physical Medicine and Rehabilitation Admission H&P    Chief Complaint  Patient presents with  .  Stroke with functional deficits    HPI:  [name] Duffy is an 85 year old RH-male with history of HTN, prostate cancer, recent falls; who was admitted on 04/06/2018 with reports of fall the day PTA and difficulty walking/ weightbearing on RLE.  History taken from chart review and patient.  CTA head/neck was negative for acute changes or fractures.  MRI brain reviewed, small left brain infarct.  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  MRI cervical spine/lumbar spine showed scoliosis with multilevel DDD.  BLE Dopplers were negative for DVT.  2D echo done revealing mild LVH with EF 60 to 65% and question of LA mass.  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 Cardiac MRI done completed, unremarkable..   Neurology recommends aspirin and Plavix x3 weeks followed by Plavix alone for stroke due to small vessel disease. Therapy ongoing and patient limited by unsteady gait with flexed posture, balance deficits as well as deficits in higher level cognitive tasks. CIR recommended due to decline in functional and cognitive status.  Patient is doing well this morning and would like to know his discharge date.   Review of Systems  Constitutional: Negative for chills and fever.  HENT: Positive for hearing loss. Negative for tinnitus.   Eyes: Positive for blurred vision (Hazzy--needs new glasses).  Respiratory: Negative for cough and shortness of breath.   Cardiovascular: Negative for chest pain, palpitations and leg swelling.  Gastrointestinal: Negative for heartburn and nausea.       Loose stools  Genitourinary: Negative for dysuria. Frequency: can get up 1-3 times at nights.  Musculoskeletal: Positive for back pain, falls, myalgias and neck pain.  Neurological: Positive for focal weakness. Negative  for dizziness and headaches.  Balance deficits. Psychiatric/Behavioral: The patient is not nervous/anxious and does not have insomnia.   All other systems reviewed and negative.    Past Medical History:  Diagnosis Date  . Anxiety   . Arthritis    hands  . Blood transfusion without reported diagnosis    pt thinks had blood trnsfusion at prostate ca surgery   . Glaucoma   . High cholesterol   . Hypertension   . Prostate cancer [facility])     Past Surgical History:  Procedure Laterality Date  . COLONOSCOPY    . HERNIA REPAIR    . PROSTATECTOMY      Family History  Problem Relation Age of Onset  . Hypertension Other   . Colon cancer Neg Hx   . Colon polyps Neg Hx   . Esophageal cancer Neg Hx   . Rectal cancer Neg Hx   . Stomach cancer Neg Hx     Social History: Married.  Lives in independent living at [facility] [place].  Wife paraplegic and wheelchair bound.  Nephew/friends have been checking on wife. He reports that he has never smoked. He has never used smokeless tobacco. He reports that he does not drink alcohol or use drugs.   Allergies  Allergen Reactions  . Rofecoxib Swelling    REACTION: swelling- Vioxx name brand     Facility-Administered Medications Prior to Admission  Medication Dose Route Frequency Provider Last Rate Last Dose  . 0.9 %  sodium chloride infusion  500 mL Intravenous Continuous Armbruster, Carlota Raspberry, MD       Medications Prior to  Admission  Medication Sig Dispense Refill  . ALPRAZolam (XANAX) 0.5 MG tablet Take 0.5 mg by mouth at bedtime.     Marland Kitchen aspirin EC 81 MG tablet Take 81 mg by mouth at bedtime.    Marland Kitchen latanoprost (XALATAN) 0.005 % ophthalmic solution Place 1 drop into the right eye at bedtime.     Marland Kitchen losartan (COZAAR) 100 MG tablet Take 100 mg by mouth.    . simvastatin (ZOCOR) 20 MG tablet Take 20 mg by mouth.    . lidocaine (LIDODERM) 5 % Place 1 patch onto the skin daily. Remove & Discard patch within 12 hours or as directed by MD  (Patient not taking: Reported on 04/06/2018) 30 patch 0  . Probiotic Product (ALIGN PO) Take 1 capsule by mouth daily.      Drug Regimen Review  Drug regimen was reviewed and remains appropriate with no significant issues identified  Home: Home Living Family/patient expects to be discharged to:: Other (Comment)([facility]) Living Arrangements: Spouse/significant other Available Help at Discharge: Personal care attendant, Family, Available PRN/intermittently Type of Home: Apartment Home Access: Elevator Home Layout: One level Bathroom Shower/Tub: Tub/shower unit, Architectural technologist: Handicapped height Home Equipment: Radio producer - single point, Wheelchair - manual, Grab bars - toilet, Grab bars - tub/shower  Lives With: Spouse   Functional History: Prior Function Level of Independence: Independent with assistive device(s) Comments: Uses SPC for ambulation. Fell backwards into tub 1st of the year. Has PCA come in to help care for wife everyday for a few hours. Wife is a paraplegic. Pt helps with ADls (changes briefs, helps with transfers). Drives. Does some cooking.   Functional Status:  Mobility: Bed Mobility Overal bed mobility: Needs Assistance Bed Mobility: Supine to Sit, Sit to Supine Supine to sit: Supervision, HOB elevated Sit to supine: Supervision General bed mobility comments: Pt seated in recliner on arrival.   Transfers Overall transfer level: Needs assistance Equipment used: Straight cane Transfers: Sit to/from Stand Sit to Stand: Min guard General transfer comment: Min guard to rise from seated surface min assistance to standing without UE support.   Ambulation/Gait Ambulation/Gait assistance: Min assist Gait Distance (Feet): 100 Feet Assistive device: Straight cane Gait Pattern/deviations: Step-through pattern, Decreased stance time - right, Trunk flexed General Gait Details: Gait remains mildly unsteady with heavy reliance on SPC.   Pt requires visual input in forward gaze to maintain balance.   Gait velocity: decreased    ADL: ADL Overall ADL's : Needs assistance/impaired Eating/Feeding: Independent, Sitting Grooming: Min guard, Standing, Wash/dry hands Grooming Details (indicate cue type and reason): min guard for safety and balance with 0 hand support, cueing to prevent forward lean on counter and stand erect Upper Body Bathing: Supervision/ safety, Set up, Sitting Lower Body Bathing: Minimal assistance, Sit to/from stand Lower Body Bathing Details (indicate cue type and reason): increased time and effort to stand Upper Body Dressing : Set up, Supervision/safety, Sitting Lower Body Dressing: Minimal assistance, Sit to/from stand Lower Body Dressing Details (indicate cue type and reason): donning shoes with supervision seated EOB, min assist in standing  Toilet Transfer: Minimal assistance, Ambulation, Regular Toilet, Grab bars(cane) Toilet Transfer Details (indicate cue type and reason): min assist to regular commode for steady assist, cueing for hand placement and safety  Toileting- Clothing Manipulation and Hygiene: Minimal assistance Toileting - Clothing Manipulation Details (indicate cue type and reason): increased time and effort to stand Functional mobility during ADLs: Cane, Min guard, Minimal assistance General ADL Comments: min assist  to min guard for safety, unsteadiness and decreased activity tolerance  Cognition: Cognition Overall Cognitive Status: Within Functional Limits for tasks assessed Arousal/Alertness: Awake/alert Orientation Level: Oriented X4 Attention: Selective Selective Attention: Impaired Selective Attention Impairment: Verbal complex Memory: (WFL; immediate recall 5/5, delayed recall 5/5) Awareness: Appears intact Problem Solving: Impaired Problem Solving Impairment: Verbal basic(slow processing; defers to mask difficulties) Executive Function: Reasoning Reasoning: Appears  intact Safety/Judgment: Appears intact Cognition Arousal/Alertness: Awake/alert Behavior During Therapy: WFL for tasks assessed/performed Overall Cognitive Status: Within Functional Limits for tasks assessed General Comments: appears [facility] for self care tasks, min cueing for safety awareness     Blood pressure (!) 142/89, pulse 97, temperature (!) 97.5 F (36.4 C), temperature source Oral, resp. rate 20, height 6\' 2"  (1.88 m), weight 72.4 kg, SpO2 98 %. Physical Exam  Nursing note and vitals reviewed. Constitutional: He is oriented to person, place, and time. He appears well-developed and well-nourished.  HENT: Normocephalic.  Atraumatic. Eyes: EOMI.  No discharge. Neck: Normal range of motion.  Neck supple. Cardiovascular: Regular rate and rhythm.  No JVD. Respiratory: Effort normal and breath sounds normal. GI: Soft.  Bowel sounds normal. Musculoskeletal:No edema or tenderness in extremities Neurological: He is alert and oriented to person, place, and time.  HOH.  Speech clear.  Able to follow basic one and two step commands without difficulty.  Motor: Bilateral upper extremities 5/5 proximal to distal Left lower extremity: 5/5 proximal distal Right lower extremity: 4+/5 proximal to distal  No results found for this or any previous visit (from the past 48 hour(s)). Mr Cardiac Morphology W Wo Contrast  Result Date: 04/10/2018 CLINICAL DATA:  85 year old male with suspicion for a left atrial mass on an echocardiogram. EXAM: CARDIAC MRI TECHNIQUE: The patient was scanned on a 1.5 Tesla GE magnet. A dedicated cardiac coil was used. Functional imaging was done using Fiesta sequences. 2,3, and 4 chamber views were done to assess for RWMA's. Modified Simpson's rule using a short axis stack was used to calculate an ejection fraction on a dedicated work Conservation officer, nature. The patient received 8 cc of Gadavist. After 10 minutes inversion recovery sequences were used to assess for  infiltration and scar tissue. CONTRAST:  8 cc  of Gadavist FINDINGS: 1. Normal left ventricular size, thickness and systolic function (LVEF = 56%). There are no regional wall motion abnormalities. There is no late gadolinium enhancement in the left ventricular myocardium. LVEDD: 42 mm LVESD: 34 mm LVEDV: 89 ml LVESV: 39 ml SV: 50 ml CO: 4.3 ml Myocardial mass: 75 g 2. Normal right ventricular size, thickness and systolic function (LVEF = 48%). There are no regional wall motion abnormalities. 3.  Normal left atrial size.  Mildly dilated right atrium. 4. Normal size of the aortic root, ascending aorta and pulmonary artery. 5.  Trivial mitral and tricuspid regurgitation. 6.  Normal pericardium.  No pericardial effusion. IMPRESSION: 1. Normal left ventricular size, thickness and systolic function (LVEF = 56%). There are no regional wall motion abnormalities. There is no late gadolinium enhancement in the left ventricular myocardium. 2. Normal right ventricular size, thickness and systolic function (LVEF = 48%). There are no regional wall motion abnormalities. 3.  Normal left atrial size.  Mildly dilated right atrium. 4.  Trivial mitral and tricuspid regurgitation. There is no evidence of a left atrial mass. Ena Dawley Electronically Signed   By: Ena Dawley   On: 04/10/2018 20:05   Medical Problem List and Plan: 1.  Functional deficits secondary to  Left centrum ovale infarct. 2.  DVT Prophylaxis/Anticoagulation: Pharmaceutical: Lovenox 3. Pain Management: Tylenol as needed 4. Mood: LCSW to follow for evaluation and support 5. Neuropsych: This patient is capable of making decisions on his own behalf. 6. Skin/Wound Care: Routine pressure relief measures 7. Fluids/Electrolytes/Nutrition: Monitor I/os.  BMP. 8.  HTN: Monitor blood pressures twice daily.  Continue to hold Cozaar to allow permissive hypertension.  Avoid hypotension.  9.  Glaucoma: Continue Xalatan right eye at bedtime. 10.  Dyslipidemia:  Continue Lipitor. 11.  Sleep disturbance/anxiety: Uses low dose Xanax at bedtime  12. Loose stools: Will discontinue colace.   Post Admission Physician Evaluation: 1. Preadmission assessment reviewed and changes made below. 2. Functional deficits secondary  to left centrum ovale infarct. 3. Patient is admitted to receive collaborative, interdisciplinary care between the physiatrist, rehab nursing staff, and therapy team. 4. Patient has experienced substantial functional loss from his/her baseline which was documented above under the "Functional History" and "Functional Status" headings.  Judging by the patient's diagnosis, physical exam, and functional history, the patient has potential for functional progress which will result in measurable gains while on inpatient rehab.  These gains will be of substantial and practical use upon discharge  in facilitating mobility and self-care at the household level. 5. Physiatrist will provide 24 hour management of medical needs as well as oversight of the therapy plan/treatment and provide guidance as appropriate regarding the interaction of the two. 6. 24 hour rehab nursing will assist with safety, disease management and patient education  and help integrate therapy concepts, techniques,education, etc. 7. PT will assess and treat for/with: Lower extremity strength, range of motion, stamina, balance, functional mobility, safety, adaptive techniques and equipment, coping skills, pain control, education. Goals are: Mod I. 8. OT will assess and treat for/with: ADL's, functional mobility, safety, upper extremity strength, adaptive techniques and equipment, ego support, and community reintegration.   Goals are: Mod I. Therapy may proceed with showering this patient. 9. SLP will assess and treat for/with: Higher-level cognition.  Goals are: Mod I. 10. Case Management and Social Worker will assess and treat for psychological issues and discharge planning. 11. Team  conference will be held weekly to assess progress toward goals and to determine barriers to discharge. 12. Patient will receive at least 3 hours of therapy per day at least 5 days per week. 13. ELOS: 6-10 days.       14. Prognosis:  good  I have personally performed a face to face diagnostic evaluation, including, but not limited to relevant history and physical exam findings, of this patient and developed relevant assessment and plan.  Additionally, I have reviewed and concur with the physician assistant's documentation above.  Delice Lesch, MD, ABPMR Bary Leriche, PA-C 04/11/2018

## 2018-04-11 NOTE — Progress Notes (Signed)
[name] Diones, RN  Rehab Admission Coordinator  Physical Medicine and Rehabilitation  PMR Pre-admission  Signed  Date of Service:  04/09/2018 12:21 PM       Related encounter: ED to Hosp-Admission (Discharged) from 04/06/2018 in [facility]      Signed         Show:Clear all [x] Manual[x] Template[x] Copied  Added by: [x] [name] Diones, RN  [] Hover for details PMR Admission Coordinator Pre-Admission Assessment  Patient: [name] Duffy is an 85 y.o., male MRN: [id number] DOB: [birth year] Height: 6\' 2"  (188 cm) Weight: 72.4 kg                                                                                                                                                  Insurance Information HMO: Yes    PPO:       PCP:       IPA:       80/20:       OTHER:   PRIMARY: Medicare A and B      Policy#: [id number]      Subscriber: patient CM Name:        Phone#:       Fax#:   Pre-Cert#:        Employer: Retired Benefits:  Phone #:       Name: Checked in passport one source [employer] Date: 12/18/96     Deduct: $1408      Out of Pocket Max: none      Life Max: N/A CIR: 100%      SNF: 100 days Outpatient: 80%     Co-Pay: 20% Home Health: 100%      Co-Pay: none DME: 80%     Co-Pay: 20% Providers: patient's choice  SECONDARY: BCBS supplement      Policy#: [id number]      Subscriber: patient CM Name:        Phone#:       Fax#:   Pre-Cert#:        Employer: Retired Benefits:  Phone #: [phone]     Name:   Eff. Date:       Deduct:        Out of Pocket Max:        Life Max:   CIR:        SNF:   Outpatient:       Co-Pay:   Home Health:        Co-Pay:   DME:       Co-Pay:    Medicaid Application Date:        Case Manager:   Disability Application Date:        Case Worker:    Emergency Publishing copy Information    Name Relation Home Work Mobile  Battie,[name] Spouse [phone]     Marylene Land    [phone]     Current Medical History  Patient Admitting Diagnosis:  L centrum ovale infarct  History of Present Illness: An 85 y.o.malewith history of HTN, prostate CA,recentfalls; who was admitted on 04/06/18 with reports of fall the day prior to admission with difficulty walking and weight bearing on RLE.History taken from chart review and patient. CT A head/neck without acute changes or fracture. MRI brain reviewed, sinus small left-sided infarct. 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 MRI cervical spine/lumbar spine showed scoliosis with multilevel DDD. Hip X rays negative for fracture. BLE dopplers negative for DVT. 2D echo done revealing mild LVH with EF 60-65% and question of LA mass. TEE recommended for work up. Neurology recommends ASA/Plavix X 3 weeks followed by Plavix alone for stroke due to small vessel. Therapy evaluation done revealing mild cognitive impairment with decreased processing, balance deficits and RLE weakness with fatigue. CIR recommended due to functional decline.   Complete NIHSS TOTAL: 0  Past Medical History      Past Medical History:  Diagnosis Date  . Anxiety   . Arthritis    hands  . Blood transfusion without reported diagnosis    pt thinks had blood trnsfusion at prostate ca surgery   . Glaucoma   . High cholesterol   . Hypertension   . Prostate cancer [facility])     Family History  family history includes Hypertension in an other family member.  Prior Rehab/Hospitalizations: No previous rehab  Has the patient had major surgery during 100 days prior to admission? No  Current Medications   Current Facility-Administered Medications:  .  0.9 %  sodium chloride infusion, 250 mL, Intravenous, PRN, Pokhrel, Laxman, MD .  acetaminophen (TYLENOL) tablet 650 mg, 650 mg, Oral, Q6H PRN **OR** acetaminophen (TYLENOL) suppository 650 mg, 650 mg, Rectal, Q6H PRN, Pokhrel, Laxman, MD .  ALPRAZolam  Duanne Moron) tablet 0.25 mg, 0.25 mg, Oral, BID PRN, Pokhrel, Laxman, MD, 0.25 mg at 04/06/18 2042 .  aspirin EC tablet 81 mg, 81 mg, Oral, Daily, Raiford Noble [place], [place], 81 mg at 04/08/18 0102 .  atorvastatin (LIPITOR) tablet 20 mg, 20 mg, Oral, q1800, Raiford Noble Latif, DO, 20 mg at 04/08/18 1751 .  citalopram (CELEXA) tablet 20 mg, 20 mg, Oral, Daily, Pokhrel, Laxman, MD, 20 mg at 04/08/18 0929 .  clopidogrel (PLAVIX) tablet 75 mg, 75 mg, Oral, Daily, Kerney Elbe, MD, 75 mg at 04/08/18 0929 .  docusate sodium (COLACE) capsule 100 mg, 100 mg, Oral, BID, Pokhrel, Laxman, MD, 100 mg at 04/08/18 2138 .  latanoprost (XALATAN) 0.005 % ophthalmic solution 1 drop, 1 drop, Right Eye, QHS, Pokhrel, Laxman, MD, 1 drop at 04/08/18 2138 .  ondansetron (ZOFRAN) tablet 4 mg, 4 mg, Oral, Q6H PRN **OR** ondansetron (ZOFRAN) injection 4 mg, 4 mg, Intravenous, Q6H PRN, Pokhrel, Laxman, MD .  polyethylene glycol (MIRALAX / GLYCOLAX) packet 17 g, 17 g, Oral, Daily PRN, Pokhrel, Laxman, MD .  sodium chloride flush (NS) 0.9 % injection 3 mL, 3 mL, Intravenous, Q12H, Pokhrel, Laxman, MD, 3 mL at 04/08/18 2140 .  sodium chloride flush (NS) 0.9 % injection 3 mL, 3 mL, Intravenous, PRN, Pokhrel, Laxman, MD  Patients Current Diet:     Diet Order                  Diet Heart Room service appropriate? Yes; Fluid consistency: Thin  Diet effective now  Precautions / Restrictions Precautions Precautions: Fall Precaution Comments: watch BP (high today) Restrictions Weight Bearing Restrictions: No   Has the patient had 2 or more falls or a fall with injury in the past year?Yes.  Patient reports at least 4 falls with injuries this past year.  Prior Activity Level Limited Community (1-2x/wk): Martin Majestic out a couple times a week to the store, was driving.  Home Assistive Devices / Equipment Home Assistive Devices/Equipment: Kasandra Knudsen (specify quad or straight) Home Equipment: Cane - single point,  Wheelchair - manual, Grab bars - toilet, Grab bars - tub/shower  Prior Device Use: Indicate devices/aids used by the patient prior to current illness, exacerbation or injury? None  Prior Functional Level Prior Function Level of Independence: Independent with assistive device(s) Comments: Uses SPC for ambulation. Fell backwards into tub 1st of the year. Has PCA come in to help care for wife everyday for a few hours. Wife is a paraplegic. Pt helps with ADls (changes briefs, helps with transfers). Drives. Does some cooking.   Self Care: Did the patient need help bathing, dressing, using the toilet or eating?  Independent  Indoor Mobility: Did the patient need assistance with walking from room to room (with or without device)? Independent  Stairs: Did the patient need assistance with internal or external stairs (with or without device)? Independent  Functional Cognition: Did the patient need help planning regular tasks such as shopping or remembering to take medications? Independent  Current Functional Level Cognition  Arousal/Alertness: Awake/alert Overall Cognitive Status: No family/caregiver present to determine baseline cognitive functioning Orientation Level: Oriented X4 General Comments: Seems WFL for basic mobility tasks but not formally assessed.  Attention: Selective Selective Attention: Impaired Selective Attention Impairment: Verbal complex Memory: (WFL; immediate recall 5/5, delayed recall 5/5) Awareness: Appears intact Problem Solving: Impaired Problem Solving Impairment: Verbal basic(slow processing; defers to mask difficulties) Executive Function: Reasoning Reasoning: Appears intact Safety/Judgment: Appears intact    Extremity Assessment (includes Sensation/Coordination)  Upper Extremity Assessment: Overall WFL for tasks assessed  Lower Extremity Assessment: RLE deficits/detail RLE Deficits / Details: Grossly ~4/5 throughout RLE Sensation: WNL RLE  Coordination: WNL    ADLs  Overall ADL's : Needs assistance/impaired Eating/Feeding: Independent, Sitting Grooming: Minimal assistance, Standing Grooming Details (indicate cue type and reason): increased time and effort to stand Upper Body Bathing: Supervision/ safety, Set up, Sitting Lower Body Bathing: Minimal assistance, Sit to/from stand Lower Body Bathing Details (indicate cue type and reason): increased time and effort to stand Upper Body Dressing : Set up, Supervision/safety, Sitting Lower Body Dressing: Minimal assistance, Sit to/from stand Lower Body Dressing Details (indicate cue type and reason): increased time and effort to stand Toilet Transfer: Minimal assistance, Ambulation, BSC Toilet Transfer Details (indicate cue type and reason): over toilet Toileting- Clothing Manipulation and Hygiene: Minimal assistance Toileting - Clothing Manipulation Details (indicate cue type and reason): increased time and effort to stand    Mobility  Overal bed mobility: Needs Assistance Bed Mobility: Supine to Sit Supine to sit: Supervision, HOB elevated General bed mobility comments: Heavy use of rail to get to EOB. no physical assist.     Transfers  Overall transfer level: Needs assistance Equipment used: Straight cane Transfers: Sit to/from Stand Sit to Stand: Min assist General transfer comment: increased time and effort to stand with Min A. Cues for technique and hand placement. stood from EOB x2, from toilet x1 using rail, transferred to chair post ambulation.    Ambulation / Gait / Stairs / Wheelchair Mobility  Ambulation/Gait Ambulation/Gait  assistance: Min Web designer (Feet): 120 Feet Assistive device: Straight cane Gait Pattern/deviations: Step-through pattern, Decreased stance time - right, Trunk flexed, Step-to pattern General Gait Details: Slow, mildly unsteady gait using SPC for support; no dragging of RLE noted today but mild balance deficits and 2  standing rest breaks.  Gait velocity: decreased    Posture / Balance Balance Overall balance assessment: Needs assistance, History of Falls Sitting-balance support: Feet supported, No upper extremity supported Sitting balance-Leahy Scale: Fair Standing balance support: During functional activity, Single extremity supported Standing balance-Leahy Scale: Poor Standing balance comment: Requires UE support in standing, able to wash hands at sink with close min guard.  High Level Balance Comments: Pt leaning down to tryt o see if there was trash on floor with LOB anteriorly.    Special needs/care consideration BiPAP/CPAP No CPM No Continuous Drip IV No Dialysis No       Life Vest No Oxygen No Special Bed No Trach Size No Wound Vac (area) No     Skin No                      Bowel mgmt: Last documented BM 04/05/18, but patient reports small BM 04/09/18 Bladder mgmt: Voiding in urinal WDL Diabetic mgmt No    Previous Home Environment Living Arrangements: Spouse/significant other  Lives With: Spouse Available Help at Discharge: Personal care attendant, Family, Available PRN/intermittently Type of Home: [place] Name: [place]: One level Home Access: Elevator Bathroom Shower/Tub: Tub/shower unit, Theatre stage manager Toilet: Handicapped height [place]: No  Discharge Living Setting Plans for Discharge Living Setting: Lives with (comment), Apartment(Lives in apartment at [facility].) Type of Home at Discharge: Apartment([place] apartment at [facility] Discharge Home Layout: One level(On the third floor of the building with elevator.) Discharge Home Access: Level entry, Elevator Does the patient have any problems obtaining your medications?: No  Social/Family/Support Systems Patient Roles: Spouse(Has a wife who is paraplegic and w/c bound.) Contact Information: Shadrick Senne - wife - [phone] Ability/Limitations  of Caregiver: Wife is w/c bound and he was the caregiver for his wife. Caregiver Availability: Intermittent Discharge Plan Discussed with Primary Caregiver: Yes Is Caregiver In Agreement with Plan?: Yes Does Caregiver/Family have Issues with Lodging/Transportation while Pt is in Rehab?: No  Goals/Additional Needs Patient/Family Goal for Rehab: PT/OT mod I and supervision goals Expected length of stay: 9-14 days Cultural Considerations: [employer] Dietary Needs: Heart diet, thin liquids Equipment Needs: TBD Pt/Family Agrees to Admission and willing to participate: Yes Program Orientation Provided & Reviewed with Pt/Caregiver Including Roles  & Responsibilities: Yes  Decrease burden of Care through IP rehab admission: N/A  Possible need for SNF placement upon discharge: Not planned  Patient Condition: This patient's medical and functional status has changed since the consult dated: 04/08/18 in which the Rehabilitation Physician determined and documented that the patient's condition is appropriate for intensive rehabilitative care in an inpatient rehabilitation facility. See "History of Present Illness" (above) for medical update. Functional changes are: Currently requiring min assist to ambulate 120 feet [place]. Patient's medical and functional status update has been discussed with the Rehabilitation physician and patient remains appropriate for inpatient rehabilitation. Will admit to inpatient rehab today.  Preadmission Screen Completed By:  [name] Duffy, 04/09/2018 12:21 PM ______________________________________________________________________   Discussed status with Dr. Posey Pronto on 04/10/18 at 1621 and received telephone approval for admission today.  Admission Coordinator:  [name] Duffy, time 1621/Date 04/10/18  Cosigned by: Jamse Arn, MD at 04/10/2018 4:31 PM  Revision History

## 2018-04-11 NOTE — Evaluation (Signed)
Speech Language Pathology Assessment and Plan  Patient Details  Name: [name] Duffy MRN: [id number] Date of Birth: 1929-05-09    Today's Date: 04/11/2018 SLP Individual Time: 6644-0347 SLP Individual Time Calculation (min): 44 min   Problem List:  Patient Active Problem List   Diagnosis Date Noted  . Loose stools   . Essential hypertension   . Glaucoma   . Dyslipidemia   . Sleep disturbance   . Left sided lacunar stroke ([place]) 04/10/2018  . Benign essential HTN   . Tachypnea   . Tachycardia   . Acute CVA (cerebrovascular accident) ([place]) 04/06/2018  . CVA (cerebral vascular accident) ([place]) 04/06/2018  . 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 Open angle with borderline findings, low risk, unspecified eye 10/12/2014  . Primary open angle glaucoma 10/12/2014  . Anxiety state 07/23/2013  . Blood in the urine 01/08/2013  . Genuine stress incontinence, male 12/13/2011  . H/O malignant neoplasm of prostate 01/28/2011   Past Medical History:  Past Medical History:  Diagnosis Date  . Anxiety   . Arthritis    hands  . Blood transfusion without reported diagnosis    pt thinks had blood trnsfusion at prostate ca surgery   . Glaucoma   . High cholesterol   . Hypertension   . Prostate cancer [facility])    Past Surgical History:  Past Surgical History:  Procedure Laterality Date  . COLONOSCOPY    . HERNIA REPAIR    . PROSTATECTOMY      Assessment / Plan / Recommendation Clinical Impression   An 85 y.o.malewith history of HTN, prostate CA,recentfalls; who was admitted on 04/06/18 with reports of fall the day prior to admission with difficulty walking and weight bearing on RLE.History taken from chart review and patient. CT A head/neck without acute changes or fracture. MRI brain reviewed, sinus small left-sided infarct. 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 MRI cervical spine/lumbar spine showed scoliosis with multilevel DDD.  Hip X rays negative for fracture. BLE dopplers negative for DVT. 2D echo done revealing mild LVH with EF 60-65% and question of LA mass. TEE recommended for work up. Neurology recommends ASA/Plavix X 3 weeks followed by Plavix alone for stroke due to small vessel. Therapy evaluation done revealing mild cognitive impairment with decreased processing, balance deficits and RLE weakness with fatigue. CIR recommended due to functional decline. Pt admitted to CIR on 04/10/2018.  SLP evaluation was completed on 04/11/2018 with results as follows:  Pt presents with grossly intact cognitive-linguistic function.  Pt endorses baseline memory deficits lasting >1 year which at this point appear to be age appropriate given that pt can recall complex daily information without difficulty.  As a result, no further acute ST needs are indicated at this time; however, SLP did provide skilled education regarding OP or [place] services post discharge if pt experiences difficulty resuming his previous level of function in the home environment.       Skilled Therapeutic Interventions          Cognitive-linguistic evaluation completed with results and recommendations reviewed with patient.     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  The above assessment, treatment plan, treatment alternatives and goals were discussed and mutually agreed upon: by patient  [name] Duffy 04/11/2018, 4:19 PM

## 2018-04-11 NOTE — IPOC Note (Signed)
Overall Plan of Care [facility]) Patient Details Name: [name] Duffy MRN: [id number] DOB: 12/28/1929  Admitting Diagnosis: Left brain infarct.  Hospital Problems: Active Problems:   Left sided lacunar stroke (HCC)   Loose stools   Essential hypertension   Glaucoma   Dyslipidemia   Sleep disturbance     Functional Problem List: Nursing Bladder, Edema, Medication Management, Nutrition, Safety, Skin Integrity  PT Balance, Endurance, Motor  OT Balance, Endurance, Motor  SLP    TR         Basic ADL's: OT Bathing, Dressing, Toileting     Advanced  ADL's: OT       Transfers: PT Bed Mobility, Bed to Chair, Car, Manufacturing systems engineer, Metallurgist: PT Ambulation, Emergency planning/management officer, Stairs     Additional Impairments: OT None  SLP        TR      Anticipated Outcomes Item Anticipated Outcome  Self Feeding no goal, pt is Independent  Swallowing      Basic self-care  mod I  Toileting  mod I   Bathroom Transfers mod I   Bowel/Bladder  mod indip  Transfers  modified independent basic; S car  Locomotion  modified independent gait x 150; S up/down 12 step 2 rails  Communication     Cognition     Pain  less<2  Safety/Judgment  mod indip   Therapy Plan: PT Intensity: Minimum of 1-2 x/day ,45 to 90 minutes PT Frequency: 5 out of 7 days PT Duration Estimated Length of Stay: 10-14 OT Intensity: Minimum of 1-2 x/day, 45 to 90 minutes OT Frequency: 5 out of 7 days OT Duration/Estimated Length of Stay: 10-12 days      Team Interventions: Nursing Interventions Patient/Family Education, Discharge Planning, Medication Management, Skin Care/Wound Management, Psychosocial Support, Cognitive Remediation/Compensation, Disease Management/Prevention  PT interventions Ambulation/gait training, Community reintegration, DME/adaptive equipment instruction, Neuromuscular re-education, Psychosocial support, Stair training, UE/LE Strength taining/ROM, Wheelchair  propulsion/positioning, Training and development officer, Discharge planning, Therapeutic Activities, UE/LE Coordination activities, Functional mobility training, Patient/family education, Splinting/orthotics, Therapeutic Exercise, Visual/perceptual remediation/compensation  OT Interventions Training and development officer, Discharge planning, DME/adaptive equipment instruction, Neuromuscular re-education, Functional mobility training, Self Care/advanced ADL retraining, Patient/family education, Psychosocial support, Therapeutic Activities, Therapeutic Exercise, UE/LE Strength taining/ROM, UE/LE Coordination activities  SLP Interventions    TR Interventions    SW/CM Interventions Discharge Planning, Psychosocial Support, Patient/Family Education   Barriers to Discharge MD  Medical stability and Lack of/limited family support  Nursing      PT      OT      SLP      SW       Team Discharge Planning: Destination: PT-Home ,OT- Home(ILF) , SLP-  Projected Follow-up: PT-Home health PT, OT-  Home health OT, SLP-None Projected Equipment Needs: PT-To be determined, OT- Tub/shower seat, SLP-None recommended by SLP Equipment Details: PT-owns a SPC, OT-  Patient/family involved in discharge planning: PT- Patient,  OT-Patient, SLP-Patient  MD ELOS: 8-12 days. Medical Rehab Prognosis:  Good Assessment: 85 year old RH-male with history of HTN, prostate cancer, recent falls; who was admitted on 04/06/2018 with reports of fall the day PTA and difficulty walking/ weightbearing on RLE.  CTA head/neck was negative for acute changes or fractures.  MRI brain revealed acute small vessel white matter infarct in left centrum ovale.  MRI cervical spine/lumbar spine showed scoliosis with multilevel DDD.  BLE Dopplers were negative for DVT.  2D echo done revealing mild LVH with EF 60 to 65%  and question of LA mass.  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  Cardiac MRI for work up - unremarkable.  Neurology recommends aspirin and Plavix x3 weeks followed by Plavix alone for stroke due to small vessel disease. Therapy ongoing and patient limited by unsteady gait with flexed posture, balance deficits as well as deficits in higher level cognitive tasks. Will set goals for Mod I with PT/OT.   See Team Conference Notes for weekly updates to the plan of care

## 2018-04-11 NOTE — Care Management Note (Signed)
[facility] Individual Statement of Services  Patient Name:  [name] Duffy  Date:  04/11/2018  Welcome to the [facility].  Our goal is to provide you with an individualized program based on your diagnosis and situation, designed to meet your specific needs.  With this comprehensive rehabilitation program, you will be expected to participate in at least 3 hours of rehabilitation therapies Monday-Friday, with modified therapy programming on the weekends.  Your rehabilitation program will include the following services:  Physical Therapy (PT), Occupational Therapy (OT), Speech Therapy (ST), 24 hour per day rehabilitation nursing, Case Management (Social Worker), Rehabilitation Medicine, Nutrition Services and Pharmacy Services  Weekly team conferences will be held on Wednesday to discuss your progress.  Your Social Worker will talk with you frequently to get your input and to update you on team discussions.  Team conferences with you and your family in attendance may also be held.  Expected length of stay: 10-12 days  Overall anticipated outcome: independent with device  Depending on your progress and recovery, your program may change. Your Social Worker will coordinate services and will keep you informed of any changes. Your Social Worker's name and contact numbers are listed  below.  The following services may also be recommended but are not provided by the Advance will be made to provide these services after discharge if needed.  Arrangements include referral to agencies that provide these services.  Your insurance has been verified to be:  [employer] Your primary doctor is:  Burnard Bunting  Pertinent information will be shared with your doctor and your insurance company.  Social Worker:  Ovidio Kin, [place] or ([phone]  Information discussed with and copy given to patient by: Elease Hashimoto, 04/11/2018, 10:14 AM

## 2018-04-11 NOTE — Progress Notes (Signed)
Social Work  Social Work Assessment and Plan  Patient Details  Name: [name] Duffy MRN: [id number] Date of Birth: 1929-06-21  Today's Date: 04/11/2018  Problem List:  Patient Active Problem List   Diagnosis Date Noted  . Loose stools   . Essential hypertension   . Glaucoma   . Dyslipidemia   . Sleep disturbance   . Left sided lacunar stroke ([place]) 04/10/2018  . Benign essential HTN   . Tachypnea   . Tachycardia   . Acute CVA (cerebrovascular accident) ([place]) 04/06/2018  . CVA (cerebral vascular accident) ([place]) 04/06/2018  . 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 Open angle with borderline findings, low risk, unspecified eye 10/12/2014  . Primary open angle glaucoma 10/12/2014  . Anxiety state 07/23/2013  . Blood in the urine 01/08/2013  . Genuine stress incontinence, male 12/13/2011  . H/O malignant neoplasm of prostate 01/28/2011   Past Medical History:  Past Medical History:  Diagnosis Date  . Anxiety   . Arthritis    hands  . Blood transfusion without reported diagnosis    pt thinks had blood trnsfusion at prostate ca surgery   . Glaucoma   . High cholesterol   . Hypertension   . Prostate cancer [facility])    Past Surgical History:  Past Surgical History:  Procedure Laterality Date  . COLONOSCOPY    . HERNIA REPAIR    . PROSTATECTOMY     Social History:  reports that he has never smoked. He has never used smokeless tobacco. He reports that he does not drink alcohol or use drugs.  Family / Support Systems Marital Status: Married Patient Roles: Spouse, Other (Comment)(Uncle) Spouse/Significant Other: Belenda Cruise [phone] Other Supports: Marylene Land [phone] Anticipated Caregiver: Unclear at this time Ability/Limitations of Caregiver: Wife has moved to care Center at [place] Availability: Other (Comment)(Will need to come up with a plan-lives at [facility]) Family Dynamics: Close with small family-wife has been  moved to the [place] at [facility] for her care, she is too much care for pt. They have a nephew who is involved but lives out fo town and very good friends and church members.  Social History Preferred language: English Religion: Baptist Cultural Background: No issues Education: Secretary/administrator educated Read: Yes Write: Yes Employment Status: Retired Public relations account executive Issues: No issues Guardian/Conservator: nephew-Charles is his POA when this is needed. MD feels pt is capable of making his own decisions while here   Abuse/Neglect Abuse/Neglect Assessment Can Be Completed: Yes Physical Abuse: Denies Verbal Abuse: Denies Sexual Abuse: Denies Exploitation of patient/patient's resources: Denies Self-Neglect: Denies  Emotional Status Pt's affect, behavior and adjustment status: Pt is motivated to do well and recover and get his balance issues managed. This is what seems to have been affected is his balance and higher level thinking. He seems to be doing well and is worried about his wife who is also worred aobut him. They have been married a long time. 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 He is worried about his wife and wants to get back to [place]. Will monitor and see if would benefit from seeing neuro-psych while here. Substance Abuse History: No issues  Patient / Family Perceptions, Expectations & Goals Pt/Family understanding of illness & functional limitations: Pt can explain his  stroke and does talk with the MD daily. He feels he is doing ok, but does see his balance issues. He is hopeful he will do well here and be able to return to his independent apartment.  Premorbid pt/family roles/activities: Husband, retiree, caregiver, church member, friend,  etc Anticipated changes in roles/activities/participation: resume Pt/family expectations/goals: Pt states: " I want to be able to be steady on my feet and get back home, so I am closer to my wife and can see her daily."  [employer]: Other (Comment)(resident [facility] Independent apt) Premorbid Home Care/DME Agencies: Other (Comment)(cane) Transportation available at discharge: friends and facility Resource referrals recommended: Support group (specify)  Discharge Planning Living Arrangements: Alone Support Systems: Spouse/significant other, Other relatives, Friends/neighbors, Church/faith community Type of Residence: [place] Name: Other (enter name of facility below) [place] Name: [facility] Insurance Resources: [employer], Multimedia programmer (specify)(BCBS) Financial Resources: Radio broadcast assistant Screen Referred: No Living Expenses: Rent Money Management: Patient Does the patient have any problems obtaining your medications?: No Home Management: Someone comes in to clean and has meals if wants them Patient/Family Preliminary Plans: HOpes to return back to his independent apartment if he is able-he does have the option to go to ALF or Rehab if needed at [facility]. HIs wife is in the [place] now due to she is too much care for him. Will await team's evaluations and work on discharge needs,. Social Work Anticipated Follow Up Needs: HH/OP, ALF/IL, Support Group  Clinical Impression Pleasant gentleman who has been providing care to his paraplegic wife for the last eight years. It seems pt's health has suffered as a result of this and wife is in the [place] now. Pt hopes to be able to go back to his independent apartment but will need to await his goals. Will work on a safe discharge plan for him  Elease Hashimoto 04/11/2018, 10:09 AM

## 2018-04-12 ENCOUNTER — Inpatient Hospital Stay (HOSPITAL_COMMUNITY): Payer: Medicare Other

## 2018-04-12 ENCOUNTER — Inpatient Hospital Stay (HOSPITAL_COMMUNITY): Payer: Medicare Other | Admitting: Occupational Therapy

## 2018-04-12 DIAGNOSIS — E46 Unspecified protein-calorie malnutrition: Secondary | ICD-10-CM

## 2018-04-12 DIAGNOSIS — E8809 Other disorders of plasma-protein metabolism, not elsewhere classified: Secondary | ICD-10-CM

## 2018-04-12 LAB — GLUCOSE, CAPILLARY
GLUCOSE-CAPILLARY: 220 mg/dL — AB (ref 70–99)
Glucose-Capillary: 89 mg/dL (ref 70–99)
Glucose-Capillary: 112 mg/dL — ABNORMAL HIGH (ref 70–99)
Glucose-Capillary: 112 mg/dL — ABNORMAL HIGH (ref 70–99)

## 2018-04-12 MED ORDER — PRO-STAT SUGAR FREE PO LIQD
30.0000 mL | Freq: Two times a day (BID) | ORAL | Status: DC
  Administered 2018-04-12 – 2018-04-19 (×15): 30 mL via ORAL
  Filled 2018-04-12 (×15): qty 30

## 2018-04-12 NOTE — Progress Notes (Signed)
Physical Therapy Session Note  Patient Details  Name: [name] Duffy MRN: [id number] Date of Birth: [birth year]  Today's Date: 04/12/2018 PT Individual Time: 1400-1500 PT Individual Time Calculation (min): 60 min   Short Term Goals: Week 1:  PT Short Term Goal 1 (Week 1): pt will transfer with supervision PT Short Term Goal 2 (Week 1): pt will perform gait x 150' with supervision PT Short Term Goal 3 (Week 1): pt will manange 12 steps 2 rails with supervision PT Short Term Goal 4 (Week 1): pt will voice understanding of scoliosis/balance   Skilled Therapeutic Interventions/Progress Updates:    Patient in recliner in room CGA for sit to stand with cane.  Gait with cane min A/CGA 1 eposide of catching R foot on floor x 150'.  Performed Berg balance assessment in gym as noted below and educated in fall prevention and need for assistive device, footwear, lighting, etc for fall prevention.  Patient ambulated with RW and S x 150' x 2 with cues for shorter steps for turning and hand placement with transfers.  Performed figure of 8 turns with RW and S.  Standing balance working on SLS tapping L/R foot to colored discs with cane for support and min A. Noted more difficult in R SLS.  Patient requested to toilet in room and assisted to bathroom for change brief after standing to void in urinal min A.  Patient sit to supine in bed with S and left with bed alarm and call bell in reach.   Therapy Documentation Precautions:  Precautions Precautions: Fall Precaution Comments: watch BP Restrictions Weight Bearing Restrictions: No Pain: Pain Assessment Pain Score: 0-No pain Faces Pain Scale: Hurts little more Pain Type: Acute pain Pain Location: Hip Pain Orientation: Right;Left Pain Descriptors / Indicators: Sore Pain Onset: With Activity Pain Intervention(s): Ambulation/increased activity  Balance: Standardized Balance Assessment Standardized Balance Assessment: Berg Balance Test Berg  Balance Test Sit to Stand: Able to stand  independently using hands Standing Unsupported: Able to stand safely 2 minutes Sitting with Back Unsupported but Feet Supported on Floor or Stool: Able to sit safely and securely 2 minutes Stand to Sit: Sits safely with minimal use of hands Transfers: Able to transfer safely, definite need of hands Standing Unsupported with Eyes Closed: Able to stand 10 seconds safely Standing Ubsupported with Feet Together: Able to place feet together independently and stand 1 minute safely From Standing, Reach Forward with Outstretched Arm: Can reach forward >12 cm safely (5") From Standing Position, Pick up Object from Floor: Able to pick up shoe, needs supervision From Standing Position, Turn to Look Behind Over each Shoulder: Needs supervision when turning Turn 360 Degrees: Needs close supervision or verbal cueing Standing Unsupported, Alternately Place Feet on Step/Stool: Able to complete >2 steps/needs minimal assist Standing Unsupported, One Foot in Front: Able to take small step independently and hold 30 seconds Standing on One Leg: Tries to lift leg/unable to hold 3 seconds but remains standing independently Total Score: 38    Therapy/Group: Individual Therapy  Reginia Naas  [place], [place] 04/12/2018  04/12/2018, 2:25 PM

## 2018-04-12 NOTE — Plan of Care (Signed)
  Problem: Consults Goal: RH STROKE PATIENT EDUCATION Description See Patient Education module for education specifics  Outcome: Progressing Goal: Nutrition Consult-if indicated Outcome: Progressing Goal: Diabetes Guidelines if Diabetic/Glucose > 140 Description If diabetic or lab glucose is > 140 mg/dl - Initiate Diabetes/Hyperglycemia Guidelines & Document Interventions  Outcome: Progressing   Problem: RH BOWEL ELIMINATION Goal: RH STG MANAGE BOWEL WITH ASSISTANCE Description STG Manage Bowel with Assistance. Outcome: Progressing Goal: RH STG MANAGE BOWEL W/MEDICATION W/ASSISTANCE Description STG Manage Bowel with Medication with Assistance. Outcome: Progressing   Problem: RH BLADDER ELIMINATION Goal: RH STG MANAGE BLADDER WITH ASSISTANCE Description STG Manage Bladder With Assistance Outcome: Progressing Goal: RH STG MANAGE BLADDER WITH MEDICATION WITH ASSISTANCE Description STG Manage Bladder With Medication With Assistance. Outcome: Progressing Goal: RH STG MANAGE BLADDER WITH EQUIPMENT WITH ASSISTANCE Description STG Manage Bladder With Equipment With Assistance Outcome: Progressing   Problem: RH SKIN INTEGRITY Goal: RH STG SKIN FREE OF INFECTION/BREAKDOWN Outcome: Progressing Goal: RH STG MAINTAIN SKIN INTEGRITY WITH ASSISTANCE Description STG Maintain Skin Integrity With Assistance. Outcome: Progressing Goal: RH STG ABLE TO PERFORM INCISION/WOUND CARE W/ASSISTANCE Description STG Able To Perform Incision/Wound Care With Assistance. Outcome: Progressing   Problem: RH SAFETY Goal: RH STG ADHERE TO SAFETY PRECAUTIONS W/ASSISTANCE/DEVICE Description STG Adhere to Safety Precautions With Assistance/Device. Outcome: Progressing Goal: RH STG DECREASED RISK OF FALL WITH ASSISTANCE Description STG Decreased Risk of Fall With Assistance. Outcome: Progressing   Problem: RH COGNITION-NURSING Goal: RH STG USES MEMORY AIDS/STRATEGIES W/ASSIST TO PROBLEM  SOLVE Description STG Uses Memory Aids/Strategies With Assistance to Problem Solve. Outcome: Progressing Goal: RH STG ANTICIPATES NEEDS/CALLS FOR ASSIST W/ASSIST/CUES Description STG Anticipates Needs/Calls for Assist With Assistance/Cues. Outcome: Progressing   Problem: RH PAIN MANAGEMENT Goal: RH STG PAIN MANAGED AT OR BELOW PT'S PAIN GOAL Outcome: Progressing   Problem: RH KNOWLEDGE DEFICIT Goal: RH STG INCREASE KNOWLEDGE OF DIABETES Outcome: Progressing Goal: RH STG INCREASE KNOWLEDGE OF HYPERTENSION Outcome: Progressing Goal: RH STG INCREASE KNOWLEDGE OF DYSPHAGIA/FLUID INTAKE Outcome: Progressing Goal: RH STG INCREASE KNOWLEGDE OF HYPERLIPIDEMIA Outcome: Progressing Goal: RH STG INCREASE KNOWLEDGE OF STROKE PROPHYLAXIS Outcome: Progressing

## 2018-04-12 NOTE — Progress Notes (Signed)
Occupational Therapy Session Note  Patient Details  Name: [name] Duffy MRN: [id number] Date of Birth: 24-Dec-1929  Today's Date: 04/12/2018 OT Individual Time: 8871-9597 OT Individual Time Calculation (min): 75 min    Short Term Goals: Week 1:  OT Short Term Goal 1 (Week 1): Pt will demonstrate improved balance by ambulating in and out of bathroom with cane with S only. OT Short Term Goal 2 (Week 1): Pt will demonstrate improved standing balance and tolerance by being able to stand in shower for 10 min.  OT Short Term Goal 3 (Week 1): Pt will complete toileting with mod I.   Skilled Therapeutic Interventions/Progress Updates:    Pt received in wc having just completed shaving and oral care at the sink.  Pt then ambulated to bathroom with cane with CGA and completed shower with S and then back to w/c to dress with S.  No LOB noted, but did need cues to sit down vs standing when doffing/donning pants over feet.   Pt used cane to ambulate to gym with CGA to close S. In gym, pt worked on dynamic balance in sitting with focus on upright posture and sit to stands holding a ball.  Pt tolerated exercises well.  Ambulated back to room to rest in wc. Seat alarm on and all needs met.  Therapy Documentation Precautions:  Precautions Precautions: Fall Precaution Comments: watch BP Restrictions Weight Bearing Restrictions: No      Pain: Pain Assessment Pain Score: 0-No pain ADL: ADL Eating: Independent Grooming: Independent Where Assessed-Grooming: Chair Upper Body Bathing: Independent Where Assessed-Upper Body Bathing: Shower Lower Body Bathing: Supervision/safety Where Assessed-Lower Body Bathing: Shower Upper Body Dressing: Setup Where Assessed-Upper Body Dressing: Chair Lower Body Dressing: Supervision/safety Where Assessed-Lower Body Dressing: Chair Toileting: Supervision/safety Where Assessed-Toileting: Glass blower/designer: Therapist, music Method: Actuary: Other (comment)(elevated toilet seat) Social research officer, government: Curator Method: Radiographer, therapeutic: Shower seat with back   Therapy/Group: Individual Therapy  [place] 04/12/2018, 12:24 PM

## 2018-04-12 NOTE — Evaluation (Addendum)
Physical Therapy Assessment and Plan  Patient Details  Name: [name] Duffy MRN: [id number] Date of Birth: April 16, 1929  PT Diagnosis: Abnormal posture, Abnormality of gait and Hemiparesis dominant Rehab Potential: Good ELOS: 10-14   Today's Date: 04/12/2018 PT Individual Time:1300  -  1400, 60 min      Problem List:  Patient Active Problem List   Diagnosis Date Noted  . Loose stools   . Essential hypertension   . Glaucoma   . Dyslipidemia   . Sleep disturbance   . Left sided lacunar stroke ([place]) 04/10/2018  . Benign essential HTN   . Tachypnea   . Tachycardia   . Acute CVA (cerebrovascular accident) ([place]) 04/06/2018  . CVA (cerebral vascular accident) ([place]) 04/06/2018  . 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 Open angle with borderline findings, low risk, unspecified eye 10/12/2014  . Primary open angle glaucoma 10/12/2014  . Anxiety state 07/23/2013  . Blood in the urine 01/08/2013  . Genuine stress incontinence, male 12/13/2011  . H/O malignant neoplasm of prostate 01/28/2011    Past Medical History:  Past Medical History:  Diagnosis Date  . Anxiety   . Arthritis    hands  . Blood transfusion without reported diagnosis    pt thinks had blood trnsfusion at prostate ca surgery   . Glaucoma   . High cholesterol   . Hypertension   . Prostate cancer [facility])    Past Surgical History:  Past Surgical History:  Procedure Laterality Date  . COLONOSCOPY    . HERNIA REPAIR    . PROSTATECTOMY      Assessment & Plan Clinical Impression: [name] Duffy is an 85 year old RH-male with history of HTN, prostate cancer, recent falls; who was admitted on 04/06/2018 with reports of fall the day PTA and difficulty walking/ weightbearing on RLE.  History taken from chart review and patient.  CTA head/neck was negative for acute changes or fractures.  MRI brain reviewed, small left brain infarct.  Per report, acute small vessel white matter infarct in left centrum  ovale.  MRI cervical spine/lumbar spine showed scoliosis with multilevel DDD.  BLE Dopplers were negative for DVT.  2D echo done revealing mild LVH with EF 60 to 65% and question of LA mass.  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 Cardiac MRI done completed, unremarkable..   Neurology recommends aspirin and Plavix x3 weeks followed by Plavix alone for stroke due to small vessel disease. Therapy ongoing and patient limited by unsteady gait with flexed posture, balance deficits as well as deficits in higher level cognitive tasks. CIR recommended due to decline in functional and cognitive status. Patient transferred to CIR on 04/10/2018 .   Patient currently requires min with mobility secondary to muscle weakness, decreased cardiorespiratoy endurance, impaired timing and sequencing, decreased visual acuity and decreased standing balance, decreased postural control, hemiplegia and decreased balance strategies.  Prior to hospitalization, patient was modified independent  with mobility and lived with Spouse in a [place] home.  Home access is  Elevator.  Patient will benefit from skilled PT intervention to maximize safe functional mobility, minimize fall risk, decrease caregiver burden and 1 for planned discharge home with wife who has paraplegia and pt was caregiver (majority of time).  Anticipate patient will benefit from follow up [place] at discharge.  PT - End of Session Activity Tolerance: Tolerates 10 - 20 min activity with multiple rests Endurance Deficit: Yes Endurance Deficit Description:  fatigued after propelling w/c x 50' PT Assessment Rehab Potential (ACUTE/IP ONLY): Good PT Patient demonstrates impairments in the following area(s): Balance;Endurance;Motor PT Transfers Functional Problem(s): Bed Mobility;Bed to Chair;Car;Furniture PT Locomotion Functional Problem(s): Ambulation;Wheelchair Mobility;Stairs PT Plan PT  Intensity: Minimum of 1-2 x/day ,45 to 90 minutes PT Frequency: 5 out of 7 days PT Duration Estimated Length of Stay: 10-14 PT Treatment/Interventions: Ambulation/gait training;Community reintegration;DME/adaptive equipment instruction;Neuromuscular re-education;Psychosocial support;Stair training;UE/LE Strength taining/ROM;Wheelchair propulsion/positioning;Balance/vestibular training;Discharge planning;Therapeutic Activities;UE/LE Coordination activities;Functional mobility training;Patient/family education;Splinting/orthotics;Therapeutic Exercise;Visual/perceptual remediation/compensation PT Transfers Anticipated Outcome(s): modified independent basic; S car PT Locomotion Anticipated Outcome(s): modified independent gait x 150; S up/down 12 step 2 rails PT Recommendation Follow Up Recommendations: Home health PT Patient destination: Home Equipment Recommended: To be determined Equipment Details: owns a [facility]  Skilled Therapeutic Intervention Pt is HOH.  He participated well in eval, but expressed dismay at his status several times.  Although his wife had a caregiver for part of the day, pt feels very responsible for her physically, and they have no children.  Pt has scoliosis and vision deficits which affect his balance, in addition to his mild hemiplegia. He has had several falls with injury in the last year.  Pt stated that he routinely gets up out of bed several times per night to urinate.  PT explained ELOS, POC. Pt left resting in w/c with seat belt alarm set and needs at hand.   PT Evaluation Precautions/Restrictions Precautions Precautions: Fall Precaution Comments: watch BP General    Pain- pt denies   Home Living/Prior Functioning Home Living Available Help at Discharge: Personal care attendant;Available PRN/intermittently(closest family is a nephew 200 miles away) Type of Home: Apartment Home Access: Elevator Home Layout: One level Bathroom Shower/Tub: Financial risk analyst: Handicapped height Additional Comments: apt has 2 bathrooms: his has a tub that he did not use a chair in, his wife has a roll in shower with a roll in shower chair. He could use that shower if needed.   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 Fell backwards into tub 1st of the year. Has PCA come in to help care for wife everyday for a few hours. Wife is a paraplegic. Pt helps with ADls (changes briefs, helps with transfers). Drives.   Vision/Perception  Vision - Assessment Additional Comments: cataract surgery 8 mon ago; blurry since then Perception Perception: Within Functional Limits Praxis Praxis: Intact  Cognition Overall Cognitive Status: Within Functional Limits for tasks assessed Arousal/Alertness: Awake/alert Orientation Level: Oriented X4 Memory: Appears intact Awareness: Appears intact Problem Solving: Appears intact Safety/Judgment: Appears intact Sensation Sensation Light Touch: Appears Intact Proprioception: Appears Intact Coordination Heel Shin Test: = bil Motor  Motor Motor: Hemiplegia;Abnormal postural alignment and control Motor - Skilled Clinical Observations: mild  Mobility Bed Mobility Bed Mobility: Not assessed Transfers Transfers: Sit to Stand;Stand to Sit;Stand Pivot Transfers Sit to Stand: Supervision/Verbal cueing Stand to Sit: Minimal Assistance - Patient > 75% Stand Pivot Transfers: Minimal Assistance - Patient > 75% Transfer (Assistive device): None Locomotion  Gait Ambulation: Yes Gait Assistance: Minimal Assistance - Patient > 75% Gait Distance (Feet): 150 Feet Assistive  device: None Gait Gait: Yes Gait Pattern: Impaired Gait Pattern: Decreased trunk rotation;Trunk flexed Gait velocity: decreased Stairs / Additional Locomotion Stairs:  Yes Stairs Assistance: Minimal Assistance - Patient > 75% Stair Management Technique: Two rails Wheelchair Mobility Wheelchair Mobility: Yes Wheelchair Assistance: Minimal assistance - Patient >75% Wheelchair Propulsion: Both upper extremities Wheelchair Parts Management: Needs assistance Distance: 50  Trunk/Postural Assessment  Cervical Assessment Cervical Assessment: Exceptions to WFL(limited extension; all movements painful) Thoracic Assessment Thoracic Assessment: Exceptions to WFL(kyphotic wiht slightly more prominent R rib hump) Lumbar Assessment Lumbar Assessment: Exceptions to WFL(severe posterior pelvic tilt) Postural Control Postural Control: Deficits on evaluation(head tilt to R in standing) Trunk Control: decreased with dynamic challenge, when pt lifts arms overhead he leans to the R Postural Limitations: pt has scoliosis; R waist crease, shoulders shifted L  Balance Balance Balance Assessed: Yes Dynamic Sitting Balance Dynamic Sitting - Level of Assistance: 5: Stand by assistance Static Standing Balance Static Standing - Level of Assistance: 5: Stand by assistance Dynamic Standing Balance Dynamic Standing - Level of Assistance: 4: Min assist Extremity Assessment      RLE Assessment RLE Assessment: Exceptions to [facility] Passive Range of Motion (PROM) Comments: tight hamstrings and heel cords General Strength Comments: grossly in sitting: hip flexion 4/5, hip abd/adduction , knee extension and ankle DF 4+/5 LLE Assessment LLE Assessment: Within Functional Limits Passive Range of Motion (PROM) Comments: tight hamstrings and heel cords    Refer to Care Plan for Long Term Goals  Recommendations for other services: None   Discharge Criteria: Patient will be discharged from PT if patient refuses  treatment 3 consecutive times without medical reason, if treatment goals not met, if there is a change in medical status, if patient makes no progress towards goals or if patient is discharged from hospital.  The above assessment, treatment plan, treatment alternatives and goals were discussed and mutually agreed upon: by patient  Susanna Benge 04/12/2018, 7:39 AM

## 2018-04-12 NOTE — Progress Notes (Signed)
[place] PHYSICAL MEDICINE & REHABILITATION PROGRESS NOTE  Subjective/Complaints: Patient seen sitting up in bed this morning.  He states he slept well overnight.  He states he had good first day of therapies yesterday.  Family at bedside.  Both family and patient would like to know when he is being discharged.  ROS: Denies CP, shortness of breath, nausea, vomiting, diarrhea.  Objective: Vital Signs: Blood pressure (!) 142/96, pulse 87, temperature 98.4 F (36.9 C), temperature source Oral, resp. rate 18, height 6\' 2"  (1.88 m), SpO2 97 %. No results found. Recent Labs    04/11/18 0541  WBC 7.2  HGB 14.4  HCT 43.9  PLT 202   Recent Labs    04/11/18 0541  NA 138  K 3.8  CL 106  CO2 24  GLUCOSE 101*  BUN 11  CREATININE 0.92  CALCIUM 9.7    Physical Exam: BP (!) 142/96 (BP Location: Right Arm)   Pulse 87   Temp 98.4 F (36.9 C) (Oral)   Resp 18   Ht 6\' 2"  (1.88 m)   SpO2 97%   BMI 20.49 kg/m  Constitutional: No distress . Vital signs reviewed. HENT: Normocephalic.  Atraumatic. Eyes: EOMI. No discharge. Cardiovascular: RRR.  No JVD. Respiratory: CTA bilaterally.  Normal effort. GI: BS +. Non-distended. Musc: No edema or tenderness in extremities. Neurological: He is alert and oriented to person, place, and time.  HOH Speech clear.  Able to follow basic one and two step commands without difficulty.  Motor: Bilateral upper extremities 5/5 proximal to distal Left lower extremity: 5/5 proximal distal, unchanged Right lower extremity: 4+/5 proximal to distal, unchanged Psych: Normal mood.  Normal affect.  Assessment/Plan: 1. 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  Physiatrist is providing close team supervision and 24 hour management of active medical problems listed below.  Physiatrist and rehab team continue to assess barriers to  discharge/monitor patient progress toward functional and medical goals  Care Tool:  Bathing    Body parts bathed by patient: Right arm, Left arm, Chest, Abdomen, Front perineal area, Buttocks, Right upper leg, Left upper leg, Face, Left lower leg, Right lower leg         Bathing assist Assist Level: Supervision/Verbal cueing     Upper Body Dressing/Undressing Upper body dressing   What is the patient wearing?: Pull over shirt, Button up shirt    Upper body assist Assist Level: Set up assist    Lower Body Dressing/Undressing Lower body dressing      What is the patient wearing?: Underwear/pull up, Pants     Lower body assist Assist for lower body dressing: Supervision/Verbal cueing     Toileting Toileting    Toileting assist Assist for toileting: Supervision/Verbal cueing     Transfers Chair/bed transfer  Transfers assist     Chair/bed transfer assist level: Contact Guard/Touching assist     Locomotion Ambulation   Ambulation assist      Assist level: Minimal Assistance - Patient > 75% Assistive device: Hand held assist Max distance: 150   Walk 10 feet activity   Assist     Assist level: Minimal Assistance - Patient > 75% Assistive device: Hand held assist   Walk 50 feet activity   Assist    Assist level: Minimal Assistance - Patient > 75% Assistive device: Hand held assist    Walk 150 feet activity   Assist  Assist level: Minimal Assistance - Patient > 75% Assistive device: Hand held assist    Walk 10 feet on uneven surface  activity   Assist Walk 10 feet on uneven surfaces activity did not occur: Safety/medical concerns(balance deficits)         Wheelchair     Assist Will patient use wheelchair at discharge?: No Type of Wheelchair: Manual    Wheelchair assist level: Minimal Assistance - Patient > 75% Max wheelchair distance: 50    Wheelchair 50 feet with 2 turns activity    Assist        Assist  Level: Minimal Assistance - Patient > 75%   Wheelchair 150 feet activity     Assist Wheelchair 150 feet activity did not occur: Safety/medical concerns(fatigue)          Medical Problem List and Plan: 1.  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  Continue CIR 2.  DVT Prophylaxis/Anticoagulation: Pharmaceutical: Lovenox 3. Pain Management: Tylenol as needed 4. Mood: LCSW to follow for evaluation and support 5. Neuropsych: This patient is capable of making decisions on his own behalf. 6. Skin/Wound Care: Routine pressure relief measures 7. Fluids/Electrolytes/Nutrition: Monitor I/os.   BMP within acceptable range on 1/23 8.  HTN: Monitor blood pressures twice daily.  Continue to hold Cozaar to allow permissive hypertension.  Avoid hypotension.   Relatively controlled on 1/24 9.  Glaucoma: Continue Xalatan right eye at bedtime. 10.  Dyslipidemia: Continue Lipitor. 11.  Sleep disturbance/anxiety: Uses low dose Xanax at bedtime  12. Loose stools: Discontinued Colace.  13.  Hypoalbuminemia  Supplement initiated on 1/24  LOS: 2 days A FACE TO FACE EVALUATION WAS PERFORMED  Darsh Vandevoort Lorie Phenix 04/12/2018, 12:34 PM

## 2018-04-12 NOTE — Progress Notes (Signed)
Occupational Therapy Session Note  Patient Details  Name: [name] Duffy MRN: [id number] Date of Birth: 15-Sep-1929  Today's Date: 04/12/2018 OT Individual Time: 1100-1200 OT Individual Time Calculation (min): 60 min    Short Term Goals: Week 1:  OT Short Term Goal 1 (Week 1): Pt will demonstrate improved balance by ambulating in and out of bathroom with cane with S only. OT Short Term Goal 2 (Week 1): Pt will demonstrate improved standing balance and tolerance by being able to stand in shower for 10 min.  OT Short Term Goal 3 (Week 1): Pt will complete toileting with mod I.   Skilled Therapeutic Interventions/Progress Updates:    Pt seen for OT session focusing on functional ambulation, transfers and standing balance/enduance. Pt sitting up in w/c upon arrival, denying pain and agreeable to tx session.  He ambulated throughout session using SPC with close supervision.   Sit<>stand from low soft surface couch in ADL apartment with supervision  Completed table top task standing on foam wedge for increased challenge and strengthening of stabilizer muscles. Pt tolerating ~15 minutes in standing before requiring seated rest break.   D[employer] activity in standing, completed standing on standard floor, progressing to standing on foam non-compliant mat with min A while reaching across midline and outside BOS to hit targets. Required min cuing for encouragement of use of L UE during task.   Pt ambulated back to room at end of session, left seated in recliner with all needs in reach, chair pad alarm activated.    Therapy Documentation Precautions:  Precautions Precautions: Fall Precaution Comments: watch BP Restrictions Weight Bearing Restrictions: No Pain:   No/denies pain   Therapy/Group: Individual Therapy  [name] 04/12/2018, 7:01 AM

## 2018-04-13 LAB — GLUCOSE, CAPILLARY
Glucose-Capillary: 94 mg/dL (ref 70–99)
Glucose-Capillary: 107 mg/dL — ABNORMAL HIGH (ref 70–99)

## 2018-04-13 NOTE — Progress Notes (Signed)
[place] PHYSICAL MEDICINE & REHABILITATION PROGRESS NOTE  Subjective/Complaints: Patient sitting up in bed this morning.  He states he feels well.  He slept well.  He has no specific complaints..  Objective: Vital Signs: Blood pressure (!) 142/92, pulse 75, temperature 98.5 F (36.9 C), resp. rate 16, height 6\' 2"  (1.88 m), SpO2 98 %. Physical Exam: BP (!) 142/92 (BP Location: Right Arm)   Pulse 75   Temp 98.5 F (36.9 C)   Resp 16   Ht 6\' 2"  (1.88 m)   SpO2 98%   BMI 20.49 kg/m  Pleasant elderly male in no acute distress.  HEENT exam atraumatic, normocephalic, extraocular muscles are intact.  Neck is supple.  Chest clear to auscultation.  Cardiac exam S1 and S2 are regular.  Abdominal exam active bowel sounds, soft, nontender.  Assessment/Plan: 1. 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  Continue CIR 2.  DVT Prophylaxis/Anticoagulation: Pharmaceutical: Lovenox 3. Pain Management: Tylenol as needed 4. Mood: LCSW to follow for evaluation and support 5. Neuropsych: This patient is capable of making decisions on his own behalf. 6. Skin/Wound Care: Routine pressure relief measures 7. Fluids/Electrolytes/Nutrition: Monitor I/os.    Basic Metabolic Panel:    Component Value Date/Time   NA 138 04/11/2018 0541   K 3.8 04/11/2018 0541   CL 106 04/11/2018 0541   CO2 24 04/11/2018 0541   BUN 11 04/11/2018 0541   CREATININE 0.92 04/11/2018 0541   GLUCOSE 101 (H) 04/11/2018 0541   CALCIUM 9.7 04/11/2018 0541    8.  HTN: Monitor blood pressures twice daily.   Allow permissive hypertension.  Current blood pressure 120-142/71-92. 9.  Glaucoma: Continue Xalatan right eye at bedtime. 10.  Dyslipidemia: Continue Lipitor. 11.  Sleep disturbance/anxiety: Uses low dose Xanax at bedtime  12. Loose stools: Discontinued Colace.  13.  Hypoalbuminemia  Supplement  initiated on 1/24  LOS: 3 days A FACE TO FACE EVALUATION WAS PERFORMED  Derrik Mceachern H Dayanara Sherrill 04/13/2018, 10:53 AM

## 2018-04-14 ENCOUNTER — Inpatient Hospital Stay (HOSPITAL_COMMUNITY): Payer: Medicare Other | Admitting: Physical Therapy

## 2018-04-14 ENCOUNTER — Inpatient Hospital Stay (HOSPITAL_COMMUNITY): Payer: Medicare Other | Admitting: Occupational Therapy

## 2018-04-14 LAB — GLUCOSE, CAPILLARY: Glucose-Capillary: 120 mg/dL — ABNORMAL HIGH (ref 70–99)

## 2018-04-14 NOTE — Progress Notes (Signed)
Physical Therapy Session Note  Patient Details  Name: [name] Duffy MRN: [id number] Date of Birth: [birth year]  Today's Date: 04/14/2018 PT Individual Time: 1100-1157 PT Individual Time Calculation (min): 57 min   Short Term Goals: Week 1:  PT Short Term Goal 1 (Week 1): pt will transfer with supervision PT Short Term Goal 2 (Week 1): pt will perform gait x 150' with supervision PT Short Term Goal 3 (Week 1): pt will manange 12 steps 2 rails with supervision PT Short Term Goal 4 (Week 1): pt will voice understanding of scoliosis/balance   Skilled Therapeutic Interventions/Progress Updates:  Pt was seen bedside in the am. Pt performed multiple sit to stand and stand pivot transfers with st cane and c/g. Pt ambulated 200, 120, and 150 feet with st cane and c/g. Pt switched clothes from washer to dryer with S to c/g. In gym treatment focused on NMR utilizing cone taps and alternating cone taps 3 sets x 10 reps each. Pt returned to room and left sitting up in recliner with call bell within reach.   Therapy Documentation Precautions:  Precautions Precautions: Fall Precaution Comments: watch BP Restrictions Weight Bearing Restrictions: No General:   Pain: No c/o pain.   Therapy/Group: Individual Therapy  Dub Amis 04/14/2018, 12:00 PM

## 2018-04-14 NOTE — Progress Notes (Signed)
Occupational Therapy Session Note  Patient Details  Name: [name] Duffy MRN: [id number] Date of Birth: 09-Jul-1929  Today's Date: 04/14/2018 OT Individual Time: [id number] and 1330-1430 OT Individual Time Calculation (min): 73 min and 60 min   Short Term Goals: Week 1:  OT Short Term Goal 1 (Week 1): Pt will demonstrate improved balance by ambulating in and out of bathroom with cane with S only. OT Short Term Goal 2 (Week 1): Pt will demonstrate improved standing balance and tolerance by being able to stand in shower for 10 min.  OT Short Term Goal 3 (Week 1): Pt will complete toileting with mod I.   Skilled Therapeutic Interventions/Progress Updates:    Session One: Pt seen for OT ADL bathing/dressing session. Pt asleep in supine upon arrival, easily awoken and agreeable to tx session, denying pain this morning.  He ambulated throughout room with [facility] and close supervision to gather clothing items in prep for showering task. HE was able o reach overhead to retrieve item from closet and pick up items from floor with guarding assist.  He completed functional transfers throughout session with overall supervision, sit>stand from EOB and toilet using SPC.  Pt completed bathing task from standing level in shower. Completed with supervision and use of grab bars to assist with dynamic standing balance when washing LEs. Discussed home set-up and potential use of shower chair for use at d/c in order to reduce fall risk and for energy conservation.  He returned to EOB to dress with supervision and increased time. Pt with poor decentric control when returning from stand>sit.  He ambulated to sink and completed grooming tasks in standing with supervision, leaning into counter for support.  Following seated rest break, he  Ambulated throughout unit to pt laundry facility. Pt placed items in top loading washer and able to manage laundry detergent bottle with supervision.  Pt returned to room in same  manner as described above. PT left seated in recliner, set-up with breakfast tray, all needs in reach and RN present.   Session Two: Pt seen for OT session focusing on IADL re-training and functional transfers. Pt asleep in recliner upon arrival, easily awoken and agreeable to tx session. Throughout session, he ambulated with [facility] and overall supervision. Occasional posterior LOB, however, pt able to self correct independently with use of environmental aids. Pt well aware of posterior bias and balance deficits. He ambulated to pt laundry facility and gathered clothing from front loading dryer, reaching towards floor with supervision to retrieve items. He stood to fold items, using B UEs to complete task and maintaining dynamic standing balance without UE support.  He was able to carry laundry items back to room while also managing cane. Discussed energy conservation and safety considerations when completing IADL tasks. Pt put away all clothing items in drawers and closets with supervision. In ADL apartment, completed simulated tub/shower transfer. With use of grab bars like he has at home, pt able to step over tub wall to complete transfer with supervision. Educated regarding recommendations of adding no-slid mat to tub floor and potential use of shower chair. Pt desires to cont bathing from standing level. He exited shower with CGA. Will benefit from cont practice with this transfer. Pt returned to room at end of session, left seated in recliner with all needs in reach.   Therapy Documentation Precautions:  Precautions Precautions: Fall Precaution Comments: watch BP Restrictions Weight Bearing Restrictions: No Pain:   No/denies pain   Therapy/Group: Individual Therapy  Jameire Kouba,  Anders Hohmann L 04/14/2018, 6:36 AM

## 2018-04-14 NOTE — Progress Notes (Signed)
[name] D[facility] PHYSICAL MEDICINE & REHABILITATION PROGRESS NOTE  Subjective/Complaints: Patient is sitting up in bed this morning.  He is dressed in street clothes.  He is working with a Transport planner.  I accompanied him down the hallway.  He was walking with minimal assistance and a cane.  Objective: Physical Exam: BP 140/85 (BP Location: Right Arm)   Pulse 73   Temp 97.8 F (36.6 C) (Oral)   Resp 18   Ht 6\' 2"  (1.88 m)   SpO2 98%   BMI 20.49 kg/m  Pleasant elderly male in no acute distress.  HEENT exam atraumatic, normocephalic, extraocular muscles are intact.  Neck is supple.  Chest clear to auscultation.  Cardiac exam S1 and S2 are regular.  Abdominal exam active bowel sounds, soft, nontender.  Gait: He has a broad-based gait.  He is managing with a cane.  Minimal assistance.  Assessment/Plan: 1. 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  Continue CIR 2.  DVT Prophylaxis/Anticoagulation: Pharmaceutical: Lovenox 3. Pain Management: Tylenol as needed 4. Mood: LCSW to follow for evaluation and support 5. Neuropsych: This patient is capable of making decisions on his own behalf. 6. Skin/Wound Care: Routine pressure relief measures 7. Fluids/Electrolytes/Nutrition: Monitor I/os.    Basic Metabolic Panel:    Component Value Date/Time   NA 138 04/11/2018 0541   K 3.8 04/11/2018 0541   CL 106 04/11/2018 0541   CO2 24 04/11/2018 0541   BUN 11 04/11/2018 0541   CREATININE 0.92 04/11/2018 0541   GLUCOSE 101 (H) 04/11/2018 0541   CALCIUM 9.7 04/11/2018 0541    8.  HTN: Monitor blood pressures twice daily.   Allow permissive hypertension.  Current blood pressure 125-140/84-85. 9.  Glaucoma: Continue Xalatan right eye at bedtime. 10.  Dyslipidemia: Continue Lipitor. 11.  Sleep disturbance/anxiety: Uses low dose Xanax at bedtime  12. Loose stools: Discontinued Colace.   13.  Hypoalbuminemia  Supplement initiated on 1/24  LOS: 4 days A FACE TO FACE EVALUATION WAS PERFORMED  Corderro Koloski H Ole Lafon 04/14/2018, 9:12 AM

## 2018-04-15 ENCOUNTER — Inpatient Hospital Stay (HOSPITAL_COMMUNITY): Payer: Medicare Other | Admitting: Occupational Therapy

## 2018-04-15 ENCOUNTER — Inpatient Hospital Stay (HOSPITAL_COMMUNITY): Payer: Medicare Other | Admitting: Physical Therapy

## 2018-04-15 LAB — GLUCOSE, CAPILLARY: Glucose-Capillary: 92 mg/dL (ref 70–99)

## 2018-04-15 NOTE — Progress Notes (Signed)
[place] PHYSICAL MEDICINE & REHABILITATION PROGRESS NOTE  Subjective/Complaints: Patient seen working with therapies standing at the sink this morning.  Slept well overnight.  He states therapies are going well.  He notes he had a good BM this morning.  He states the weekend was up and down because he did not meet his own expectations regarding balancing on 1 leg.  ROS: Denies CP, shortness of breath, nausea, vomiting, diarrhea.  Objective: Vital Signs: Blood pressure (!) 138/94, pulse 76, temperature 98.6 F (37 C), temperature source Oral, resp. rate 14, height 6\' 2"  (1.88 m), SpO2 98 %. No results found. No results for input(s): WBC, HGB, HCT, PLT in the last 72 hours. No results for input(s): NA, K, CL, CO2, GLUCOSE, BUN, CREATININE, CALCIUM in the last 72 hours.  Physical Exam: BP (!) 138/94 (BP Location: Right Arm)   Pulse 76   Temp 98.6 F (37 C) (Oral)   Resp 14   Ht 6\' 2"  (1.88 m)   SpO2 98%   BMI 20.49 kg/m  Constitutional: No distress . Vital signs reviewed. HENT: Normocephalic.  Atraumatic. Eyes: EOMI. No discharge. Cardiovascular: RRR.  No JVD. Respiratory: CTA bilaterally.  Normal effort. GI: BS +. Non-distended. Musc: No edema or tenderness in extremities. Neurological: He is alert and oriented to person, place, and time.  HOH Speech clear.  Able to follow basic one and two step commands without difficulty.  Motor: Bilateral upper extremities 5/5 proximal to distal Left lower extremity: 5/5 proximal distal, stable Right lower extremity: 4+/5 proximal to distal, stable Psych: Normal mood.  Normal affect.  Assessment/Plan: 1. 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  Physiatrist is providing close team supervision and 24 hour management of active medical problems listed below.  Physiatrist and rehab team continue to assess barriers  to discharge/monitor patient progress toward functional and medical goals  Care Tool:  Bathing    Body parts bathed by patient: Right arm, Left arm, Chest, Abdomen, Front perineal area, Buttocks, Right upper leg, Left upper leg, Right lower leg, Left lower leg         Bathing assist Assist Level: Supervision/Verbal cueing     Upper Body Dressing/Undressing Upper body dressing   What is the patient wearing?: Pull over shirt    Upper body assist Assist Level: Independent    Lower Body Dressing/Undressing Lower body dressing      What is the patient wearing?: Underwear/pull up, Pants     Lower body assist Assist for lower body dressing: Supervision/Verbal cueing     Toileting Toileting    Toileting assist Assist for toileting: Supervision/Verbal cueing     Transfers Chair/bed transfer  Transfers assist     Chair/bed transfer assist level: Contact Guard/Touching assist     Locomotion Ambulation   Ambulation assist      Assist level: Contact Guard/Touching assist Assistive device: Cane-straight Max distance: 200   Walk 10 feet activity   Assist     Assist level: Contact Guard/Touching assist Assistive device: Cane-straight   Walk 50 feet activity   Assist    Assist level: Contact Guard/Touching assist Assistive device: Cane-straight    Walk 150 feet activity   Assist    Assist level: Contact Guard/Touching assist Assistive device: Cane-straight    Walk 10 feet on uneven surface  activity   Assist Walk 10 feet on uneven surfaces activity did not occur: Safety/medical concerns  Wheelchair     Assist Will patient use wheelchair at discharge?: No Type of Wheelchair: Manual    Wheelchair assist level: Minimal Assistance - Patient > 75% Max wheelchair distance: 50    Wheelchair 50 feet with 2 turns activity    Assist        Assist Level: Minimal Assistance - Patient > 75%   Wheelchair 150 feet activity      Assist Wheelchair 150 feet activity did not occur: Safety/medical concerns(fatigue)          Medical Problem List and Plan: 1.  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  Continue CIR  Weekend notes reviewed- relatively uneventful 2.  DVT Prophylaxis/Anticoagulation: Pharmaceutical: Lovenox 3. Pain Management: Tylenol as needed 4. Mood: LCSW to follow for evaluation and support 5. Neuropsych: This patient is capable of making decisions on his own behalf. 6. Skin/Wound Care: Routine pressure relief measures 7. Fluids/Electrolytes/Nutrition: Monitor I/os.   BMP within acceptable range on 1/23 8.  HTN: Monitor blood pressures twice daily.  Continue to hold Cozaar to allow permissive hypertension.  Avoid hypotension.   Relatively controlled on 1/27, slightly elevated diastolic readings 9.  Glaucoma: Continue Xalatan right eye at bedtime. 10.  Dyslipidemia: Continue Lipitor. 11.  Sleep disturbance/anxiety: Uses low dose Xanax at bedtime  12. Loose stools: Discontinued Colace.   Improving 13.  Hypoalbuminemia  Supplement initiated on 1/24  LOS: 5 days A FACE TO FACE EVALUATION WAS PERFORMED  Ankit Lorie Phenix 04/15/2018, 9:50 AM

## 2018-04-15 NOTE — Progress Notes (Signed)
Occupational Therapy Session Note  Patient Details  Name: [name] Duffy MRN: [id number] Date of Birth: [birth year]  Today's Date: 04/15/2018 OT Individual Time: 4944-9675 and 1106-1200 OT Individual Time Calculation (min): 75 min and 54 min   Short Term Goals: Week 1:  OT Short Term Goal 1 (Week 1): Pt will demonstrate improved balance by ambulating in and out of bathroom with cane with S only. OT Short Term Goal 2 (Week 1): Pt will demonstrate improved standing balance and tolerance by being able to stand in shower for 10 min.  OT Short Term Goal 3 (Week 1): Pt will complete toileting with mod I.   Skilled Therapeutic Interventions/Progress Updates:    Session One: Pt seen for OT ADL bathing/dressing session. Pt awake in supine upon arrival, ready for tx session and denying pain. Throughout session, he completed functional transfers and ambulation using SPC and close supervision-guarding assist for dynamic tasks when completing functional tasks such as retrieving clothing items from closet and floor in prep for showering task.  He completed toileting task with supervision. Transitioned to shower and bathed from standing position, use of grab bar to assist with balance, maintaining one UE support on grab bar at all times when washing body. He demonstrates ability to maintain dynamic standing balanace while balancing one one LE to wash/dry the other LE. Pt exited shower and returned to sitting EOB to dress with supervision and increased time overall. One posterior LOB requiring min A to regain balance. Education provided regarding importance of establishing balance prior to completing functional tasks. He stood at sink to complete grooming tasks, independently established wide BOS for dynamic standing activity and completed grooming tasks standing with supervision.  Addressed IADL simple meal prep activity. He ambulated to ADL apartment carrying breakfast plate, using no AD and CGA from  therapist. He re-heated breakfast in microwave and carried plate to kitchen table to eat breakfast. Pt maintaining dynamic balance and ambulation within kitchen task with supervision.  Pt left seated in ADL apartment, requesting to finish breakfast there. NT made aware and will assist pt back to room.   Session Two: Pt seen for OT session focusing on dynamic standing balance, functional ambulation and activity tolerance. Pt sitting up in recliner upon arrival, denying pain and agreeable to tx session. Pt first had questions regarding use of his cell phone, assisted with this.  He ambulated throughout unit with supervision using SPC. In therapy gym, completed standing task standing on incline wedge mat in order to facilitate anterior weight shift in standing. Tolerated standing ~10 minutes while completing table top task.  In standing, completed toe taps on cones with B UEs focusing on weight shift R<>L, completed with CGA x5.  Following seated rest break, completed obstacle course, stepping over items and weaving through cones. VCs for upright posture, completed with supervision x4 trials. Then picked up all items off floor and returned to storage area with CGA. Pt returned to room at end of session, left seated in recliner with all needs in reach awaiting lunch tray.    Therapy Documentation Precautions:  Precautions Precautions: Fall Precaution Comments: watch BP Restrictions Weight Bearing Restrictions: No Pain:   No/denies pain   Therapy/Group: Individual Therapy  Pamila Mendibles L 04/15/2018, 7:02 AM

## 2018-04-15 NOTE — Progress Notes (Signed)
Physical Therapy Session Note  Patient Details  Name: [name] Duffy MRN: [id number] Date of Birth: 1929/07/08  Today's Date: 04/15/2018 PT Individual Time: 1345-1445 PT Individual Time Calculation (min): 60 min   Short Term Goals: Week 1:  PT Short Term Goal 1 (Week 1): pt will transfer with supervision PT Short Term Goal 2 (Week 1): pt will perform gait x 150' with supervision PT Short Term Goal 3 (Week 1): pt will manange 12 steps 2 rails with supervision PT Short Term Goal 4 (Week 1): pt will voice understanding of scoliosis/balance   Skilled Therapeutic Interventions/Progress Updates:  Pt received sitting in chair, in good spirits, willing to participate in therapy. Pt performed multiple sit to stand transfers with S and cane. Pt ambulated 200 ft to gym with straight cane and CGA. In gym, performed static balance with feet together, eyes open 30 sec x1; feet together eyes closed 30sec x 3; partial tandem stance (toes beside heel), eyes open x 1 bilateral, all minA to maintain balance. Standing balance practiced with cone taps and minA to maintain balance. Noted more difficulty with single leg balance on R side. Navigated obstacle course with mat with towels underneath, stepping over weight bars and yoga blocks, and weaving around cones with cane and CGA. Pt was able to walk backwards over mat with towels underneat with minA for regaining balance. Attempted single leg stance with ball toss, which was too difficult. Pt unable to maintain single leg stance. Performed forward and backward walking with ball toss, CGA for forward walk, minA to regain balance 1x during backward walk. Pt ambulated 200 ft back to room with CGA, one instance of toe catching. He was able to regain balance, and says his toe drags sometimes when he gets tired. Pt left sitting in chair with alarm belt, call bell in reach, and needs met.     Therapy Documentation Precautions:  Precautions Precautions:  Fall Precaution Comments: watch BP Restrictions Weight Bearing Restrictions: No General:   Vital Signs: Therapy Vitals Temp: 97.6 F (36.4 C) Temp Source: Oral Pulse Rate: 93 Resp: 16 BP: (!) 135/93 Patient Position (if appropriate): Sitting Oxygen Therapy SpO2: 97 % O2 Device: Room Air Pain: Pain Assessment Pain Scale: 0-10 Pain Score: 0-No pain     Therapy/Group: Individual Therapy    Ronnell Guadalajara, SPT  04/15/2018, 3:53 PM

## 2018-04-16 ENCOUNTER — Inpatient Hospital Stay (HOSPITAL_COMMUNITY): Payer: Medicare Other | Admitting: Physical Therapy

## 2018-04-16 ENCOUNTER — Inpatient Hospital Stay (HOSPITAL_COMMUNITY): Payer: Medicare Other

## 2018-04-16 ENCOUNTER — Inpatient Hospital Stay (HOSPITAL_COMMUNITY): Payer: Medicare Other | Admitting: Occupational Therapy

## 2018-04-16 NOTE — Progress Notes (Signed)
Physical Therapy Session Note  Patient Details  Name: [name] Duffy MRN: [id number] Date of Birth: 09-Mar-1930  Today's Date: 04/16/2018 PT Individual Time: 1030-1200 PT Individual Time Calculation (min): 90 min   Short Term Goals: Week 1:  PT Short Term Goal 1 (Week 1): pt will transfer with supervision PT Short Term Goal 2 (Week 1): pt will perform gait x 150' with supervision PT Short Term Goal 3 (Week 1): pt will manange 12 steps 2 rails with supervision PT Short Term Goal 4 (Week 1): pt will voice understanding of scoliosis/balance   Skilled Therapeutic Interventions/Progress Updates:   Pt received sitting in recliner and agreeable to tx. Pt sits<>stands with supervision during session. Pt ambulates throughout unit with SPC and close supervision. Pt performs ladder drills w/o AD forwards, backwards, and side-stepping to challenge SLS balance with CGA>min assist to maintain balance and verbal cuing for foot placement and increased step length when stepping backwards. Pt stands on foam pad and matches cards on board for dual task component with feet apart>feet together with min assist to get into the position but able to maintain with close supervision. Pt stands on foam pad with feet apart and completes two pipe tree patterns for standing endurance and balance challenge with min cuing for correct pipe length and placement and increased time to complete activity. Pt states he has difficulty seeing picture clearly due to poor vision, but does not have new glasses yet for assistance. Pt utilizes Biodex on limits of stability setting w/o use of BUEs to work on hip and ankle strategy and requires increased verbal and tactile cuing for correct weight shifting technique. Pt ambulates 100 feet with SPC and close supervision while counting backwards from 100 by 3s for cognitive challenge during gait. Pt requires min verbal cuing during activity but states he does not remember how he got to the other  room since he was concentrating on the math. Pt ambulates 75 ft w/o AD and CGA to work on balance with walking and verbal cues for upright posture during gait. Pt ambulates to toilet in room with close supervision, able to void bladder and perform self-hygiene tasks with supervision. Pt left sitting up in recliner with chair alarm on and all need in reach.  No c/o pain during session but feels fatigued towards end of session and rest break provided.  Therapy Documentation Precautions:  Precautions Precautions: Fall Precaution Comments: watch BP Restrictions Weight Bearing Restrictions: No Pain: Pain Assessment Pain Scale: 0-10 Pain Score: 0-No pain    Therapy/Group: Individual Therapy  Chiann Goffredo 04/16/2018, 12:53 PM

## 2018-04-16 NOTE — Progress Notes (Signed)
Physical Therapy Session Note  Patient Details  Name: [name] Duffy MRN: [id number] Date of Birth: 1929-05-05  Today's Date: 04/16/2018 PT Individual Time: 0830-0930 PT Individual Time Calculation (min): 60 min   Short Term Goals: Week 1:  PT Short Term Goal 1 (Week 1): pt will transfer with supervision PT Short Term Goal 2 (Week 1): pt will perform gait x 150' with supervision PT Short Term Goal 3 (Week 1): pt will manange 12 steps 2 rails with supervision PT Short Term Goal 4 (Week 1): pt will voice understanding of scoliosis/balance   Skilled Therapeutic Interventions/Progress Updates:    Patient sitting in kitchen in ADL apartment finishing breakfast after OT session.  Sit to stand S and gait with cane carrying pumpkin bread back to his room.  Gait to gym for LE therex as noted below.  Balance/gait/coordination training in hallway with railing for side stepping, crossing over with cues for foot placement, heel and toe walking and tandem gait then with cane and walking within one column of tile for more narrow BOS.  Left in recliner with RN in room.  Therapy Documentation Precautions:  Precautions Precautions: Fall Precaution Comments: watch BP Restrictions Weight Bearing Restrictions: No  Pain: Pain Assessment Pain Scale: Faces Pain Score: 0-No pain Faces Pain Scale: Hurts little more Pain Type: Acute pain Pain Location: Head Pain Orientation: Anterior Pain Descriptors / Indicators: Aching;Headache Pain Frequency: Intermittent Pain Onset: Unable to tell Pain Intervention(s): Medication (See eMAR);Repositioned(tylenol given) Exercises: Cardiovascular Exercises NuStep: 5 min level 5 UE/LE General Exercises - Lower Extremity Hip ABduction/ADduction: Strengthening;10 reps;Supine(hooklying with blue t-band) Straight Leg Raises: Strengthening;10 reps;Supine Total Joint Exercises Bridges: Strengthening;Both;10 reps;Supine Other Exercises Other Exercises: trunk  rotation with stretch x 10 sec x 5 each way with arms out to side Other Exercises: single knee to chest x 20 sec hold Other Exercises: supine hamstring stretch 2 x 20 sec Other Treatments:      Therapy/Group: Individual Therapy  Reginia Naas  [place], [place] 04/16/2018  04/16/2018, 4:37 PM

## 2018-04-16 NOTE — Plan of Care (Signed)
  Problem: Consults Goal: RH STROKE PATIENT EDUCATION Description See Patient Education module for education specifics  Outcome: Progressing Goal: Nutrition Consult-if indicated Outcome: Progressing Goal: Diabetes Guidelines if Diabetic/Glucose > 140 Description If diabetic or lab glucose is > 140 mg/dl - Initiate Diabetes/Hyperglycemia Guidelines & Document Interventions  Outcome: Progressing   Problem: RH BOWEL ELIMINATION Goal: RH STG MANAGE BOWEL WITH ASSISTANCE Description STG Manage Bowel with Assistance. Mod I    Outcome: Progressing   Problem: RH BLADDER ELIMINATION Goal: RH STG MANAGE BLADDER WITH ASSISTANCE Description STG Manage Bladder With Assistance. Mod I  Outcome: Progressing   Problem: RH SKIN INTEGRITY Goal: RH STG SKIN FREE OF INFECTION/BREAKDOWN Description Skin will remain free of infection and breakdown while on rehab with min assist  Outcome: Progressing   Problem: RH SAFETY Goal: RH STG ADHERE TO SAFETY PRECAUTIONS W/ASSISTANCE/DEVICE Description STG Adhere to Safety Precautions With Assistance/Device. Mod I  Outcome: Progressing Goal: RH STG DECREASED RISK OF FALL WITH ASSISTANCE Description STG Decreased Risk of Fall With Assistance. Mod I  Outcome: Progressing   Problem: RH COGNITION-NURSING Goal: RH STG USES MEMORY AIDS/STRATEGIES W/ASSIST TO PROBLEM SOLVE Description STG Uses Memory Aids/Strategies With Assistance to Problem Solve. Mod I  Outcome: Progressing Goal: RH STG ANTICIPATES NEEDS/CALLS FOR ASSIST W/ASSIST/CUES Description STG Anticipates Needs/Calls for Assist With Assistance/Cues. Mod I  Outcome: Progressing   Problem: RH PAIN MANAGEMENT Goal: RH STG PAIN MANAGED AT OR BELOW PT'S PAIN GOAL Description No pain or less than 2  Outcome: Progressing   Problem: RH KNOWLEDGE DEFICIT Goal: RH STG INCREASE KNOWLEDGE OF HYPERTENSION Description Patient will be able to describe management of hypertension including medications,  diet and target BP range with cues/handouts  Outcome: Progressing Goal: RH STG INCREASE KNOWLEGDE OF HYPERLIPIDEMIA Description Patient will be able to describe management of hyperlipidemia including medications, diet and exercise.with cues and handouts  Outcome: Progressing Goal: RH STG INCREASE KNOWLEDGE OF STROKE PROPHYLAXIS Description Patient will be able to describe prevention of stroke with cues/handouts  Outcome: Progressing

## 2018-04-16 NOTE — NC FL2 (Signed)
[place] LEVEL OF CARE SCREENING TOOL     IDENTIFICATION  Patient Name: [name] Duffy Birthdate: [birth year] Sex: male Admission Date (Current Location): 04/10/2018  [facility] and [place] Number:  Herbalist and Address:  [facility] [place] [place], [place] [address], [place], [place] [address]      Provider Number: [id number]  Attending Physician Name and Address:  Jamse Arn, MD  Relative Name and Phone Number:  Marylene Land- [phone]    Current Level of Care: Other (Comment)(rehab) Recommended Level of Care: Assisted Living Facility Prior Approval Number:    Date Approved/Denied:   PASRR Number:    Discharge Plan: Other (Comment)(ALF)    Current Diagnoses: Patient Active Problem List   Diagnosis Date Noted  . Hypoalbuminemia due to protein-calorie malnutrition ([place])   . Loose stools   . Essential hypertension   . Glaucoma   . Dyslipidemia   . Sleep disturbance   . Left sided lacunar stroke ([place]) 04/10/2018  . Benign essential HTN   . Tachypnea   . Tachycardia   . Acute CVA (cerebrovascular accident) ([place]) 04/06/2018  . CVA (cerebral vascular accident) ([place]) 04/06/2018  . 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 Open angle with borderline findings, low risk, unspecified eye 10/12/2014  . Primary open angle glaucoma 10/12/2014  . Anxiety state 07/23/2013  . Blood in the urine 01/08/2013  . Genuine stress incontinence, male 12/13/2011  . H/O malignant neoplasm of prostate 01/28/2011    Orientation RESPIRATION BLADDER Height & Weight     Self, Time, Situation, Place  Normal Continent Weight:   Height:  6\' 2"  (188 cm)  BEHAVIORAL SYMPTOMS/MOOD NEUROLOGICAL BOWEL NUTRITION STATUS      Continent Diet(regular diet)  AMBULATORY STATUS COMMUNICATION OF NEEDS Skin   Supervision Verbally Normal                       Personal Care Assistance Level of Assistance  Bathing, Dressing Bathing  Assistance: Limited assistance Feeding assistance: Independent Dressing Assistance: Limited assistance     Functional Limitations Info  Sight Sight Info: Impaired(wears glasses) Hearing Info: Adequate Speech Info: Adequate    SPECIAL CARE FACTORS FREQUENCY  PT (By licensed PT), OT (By licensed OT)     PT Frequency: 3x week OT Frequency: 3x week            Contractures Contractures Info: Not present    Additional Factors Info  Code Status, Allergies, Psychotropic Code Status Info: Full Code Allergies Info: Rofecoxib Psychotropic Info: Celexa 20 mg daily and Xanax-prn         Current Medications (04/16/2018):  This is the current hospital active medication list Current Facility-Administered Medications  Medication Dose Route Frequency Provider Last Rate Last Dose  . acetaminophen (TYLENOL) tablet 325-650 mg  325-650 mg Oral Q4H PRN Bary Leriche, PA-C      . ALPRAZolam Duanne Moron) tablet 0.25 mg  0.25 mg Oral BID PRN Bary Leriche, PA-C   0.25 mg at 04/15/18 2039  . alum & mag hydroxide-simeth (MAALOX/MYLANTA) 200-200-20 MG/5ML suspension 30 mL  30 mL Oral Q4H PRN Love, Pamela S, PA-C      . aspirin EC tablet 81 mg  81 mg Oral Daily Bary Leriche, PA-C   81 mg at 04/16/18 9024  . atorvastatin (LIPITOR) tablet 20 mg  20 mg Oral q1800 Bary Leriche, PA-C   20 mg at 04/15/18 1746  . bisacodyl (DULCOLAX)  suppository 10 mg  10 mg Rectal Daily PRN Love, Pamela S, PA-C      . citalopram (CELEXA) tablet 20 mg  20 mg Oral Daily Bary Leriche, PA-C   20 mg at 04/16/18 3491  . clopidogrel (PLAVIX) tablet 75 mg  75 mg Oral Daily Bary Leriche, PA-C   75 mg at 04/16/18 7915  . diphenhydrAMINE (BENADRYL) 12.5 MG/5ML elixir 12.5-25 mg  12.5-25 mg Oral Q6H PRN Love, Pamela S, PA-C      . enoxaparin (LOVENOX) injection 40 mg  40 mg Subcutaneous Q24H Love, Pamela S, PA-C   40 mg at 04/15/18 1746  . feeding supplement (PRO-STAT SUGAR FREE 64) liquid 30 mL  30 mL Oral BID Jamse Arn,  MD   30 mL at 04/16/18 0928  . guaiFENesin-dextromethorphan (ROBITUSSIN DM) 100-10 MG/5ML syrup 5-10 mL  5-10 mL Oral Q6H PRN Love, Pamela S, PA-C      . latanoprost (XALATAN) 0.005 % ophthalmic solution 1 drop  1 drop Right Eye QHS Bary Leriche, PA-C   1 drop at 04/15/18 2039  . lidocaine (LIDODERM) 5 % 1 patch  1 patch Transdermal Q24H Bary Leriche, PA-C   Stopped at 04/16/18 0569  . polyethylene glycol (MIRALAX / GLYCOLAX) packet 17 g  17 g Oral Daily PRN Love, Pamela S, PA-C      . prochlorperazine (COMPAZINE) tablet 5-10 mg  5-10 mg Oral Q6H PRN Love, Pamela S, PA-C       Or  . prochlorperazine (COMPAZINE) injection 5-10 mg  5-10 mg Intramuscular Q6H PRN Love, Pamela S, PA-C       Or  . prochlorperazine (COMPAZINE) suppository 12.5 mg  12.5 mg Rectal Q6H PRN Love, Pamela S, PA-C      . sodium phosphate (FLEET) 7-19 GM/118ML enema 1 enema  1 enema Rectal Once PRN Love, Pamela S, PA-C      . traZODone (DESYREL) tablet 25-50 mg  25-50 mg Oral QHS PRN Bary Leriche, PA-C         Discharge Medications: Please see discharge summary for a list of discharge medications.  Relevant Imaging Results:  Relevant Lab Results:   Additional Information SSN: [id number]  Ishan Sanroman, Gardiner Rhyme, LCSW

## 2018-04-16 NOTE — Progress Notes (Signed)
Occupational Therapy Session Note  Patient Details  Name: [name] Duffy MRN: [id number] Date of Birth: 12-Aug-1929  Today's Date: 04/16/2018 OT Individual Time: 5537-4827 OT Individual Time Calculation (min): 60 min    Short Term Goals: Week 1:  OT Short Term Goal 1 (Week 1): Pt will demonstrate improved balance by ambulating in and out of bathroom with cane with S only. OT Short Term Goal 2 (Week 1): Pt will demonstrate improved standing balance and tolerance by being able to stand in shower for 10 min.  OT Short Term Goal 3 (Week 1): Pt will complete toileting with mod I.   Skilled Therapeutic Interventions/Progress Updates:    Pt seen for OT ADL bathing/dressing session. Pt awake sitting up in bed upon arrival, agreeable to tx session and denying pain.  He ambulated throughout session with supervision using SPC, occasionally carrying cane and not using for support during ambulation. He gathered clothing items in prep for showering task, reaching into low drawers and overhead closet to gather items.  He completed toileting task and transfer with supervision using grab bar. He transitioned into shower and bathed from standing position using grab bars for steadying assist throughout. Pt able to maintain dynamic standing balance when standing on one LE to wash/dry other LE. PRovided education regarding sitting to complete dressing tasks when able in order to reduce fall risk as pt attempting to don shirt from standing position. UB/LB bathing tasks completed as overall supervision, demonstrating much improved functional sitting and standing balance this session. He completed grooming tasks standing at sink with distant supervision.  Pt ambulated from room towards ADL apartment carrying heavy meal tray, no AD. Required CGA and VCs for safety as pt increasing ambulation speed to unsafe level.  In ADL apartment, pt re-heated breakfast tray at microwave level and practiced transporting items  throughout kitchen in simulation of home environment. Pt left seated in ADL apartment finishing breakfast awaiting handoff to PT.  Throughout session, discussed pt's perceived level of confidence with mobility and balance, therapy goals, community re-integration, continuum of care, and d/c planning.   Therapy Documentation Precautions:  Precautions Precautions: Fall Precaution Comments: watch BP Restrictions Weight Bearing Restrictions: No Pain:   No/denies pain   Therapy/Group: Individual Therapy  Maleta Pacha L 04/16/2018, 7:04 AM

## 2018-04-16 NOTE — Progress Notes (Addendum)
[place] PHYSICAL MEDICINE & REHABILITATION PROGRESS NOTE  Subjective/Complaints:   ROS: Denies CP, shortness of breath, nausea, vomiting, diarrhea.  Objective: Vital Signs: Blood pressure (!) 142/91, pulse 71, temperature 98.9 F (37.2 C), temperature source Oral, resp. rate 18, height 6\' 2"  (1.88 m), SpO2 99 %. No results found. No results for input(s): WBC, HGB, HCT, PLT in the last 72 hours. No results for input(s): NA, K, CL, CO2, GLUCOSE, BUN, CREATININE, CALCIUM in the last 72 hours.  Physical Exam: BP (!) 142/91 (BP Location: Right Arm)   Pulse 71   Temp 98.9 F (37.2 C) (Oral)   Resp 18   Ht 6\' 2"  (1.88 m)   SpO2 99%   BMI 20.49 kg/m  Constitutional: No distress . Vital signs reviewed. HENT: Normocephalic.  Atraumatic. Eyes: EOMI. No discharge. Cardiovascular: RRR. No JVD. Respiratory: CTA bilaterally. Normal effort. GI: BS +. Non-distended. Musc: No edema or tenderness in extremities. Neurological: He is alert and oriented  [facility] Speech clear.  Able to follow basic one and two step commands without difficulty.  Motor: Bilateral upper extremities 5/5 proximal to distal Left lower extremity: 5/5 proximal distal, stable Right lower extremity: 4+/5 proximal to distal, stable Psych: Normal mood.  Normal affect. Skin: Venous stasis changes bilateral lower extremities  Assessment/Plan: 1. 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  Physiatrist is providing close team supervision and 24 hour management of active medical problems listed below.  Physiatrist and rehab team continue to assess barriers to discharge/monitor patient progress toward functional and medical goals  Care Tool:  Bathing    Body parts bathed by patient: Right arm, Left arm, Chest, Abdomen, Front perineal area, Buttocks, Right upper leg, Left upper leg, Right lower leg,  Left lower leg         Bathing assist Assist Level: Supervision/Verbal cueing     Upper Body Dressing/Undressing Upper body dressing   What is the patient wearing?: Pull over shirt    Upper body assist Assist Level: Independent    Lower Body Dressing/Undressing Lower body dressing      What is the patient wearing?: Underwear/pull up, Pants     Lower body assist Assist for lower body dressing: Supervision/Verbal cueing     Toileting Toileting    Toileting assist Assist for toileting: Supervision/Verbal cueing     Transfers Chair/bed transfer  Transfers assist     Chair/bed transfer assist level: Contact Guard/Touching assist     Locomotion Ambulation   Ambulation assist      Assist level: Contact Guard/Touching assist Assistive device: Cane-straight Max distance: 200   Walk 10 feet activity   Assist     Assist level: Contact Guard/Touching assist Assistive device: Cane-straight   Walk 50 feet activity   Assist    Assist level: Contact Guard/Touching assist Assistive device: Cane-straight    Walk 150 feet activity   Assist    Assist level: Contact Guard/Touching assist Assistive device: Cane-straight    Walk 10 feet on uneven surface  activity   Assist Walk 10 feet on uneven surfaces activity did not occur: Safety/medical concerns   Assist level: Contact Guard/Touching assist Assistive device: Hospital doctor     Assist Will patient use wheelchair at discharge?: No Type of Wheelchair: Manual    Wheelchair assist level: Minimal Assistance - Patient > 75% Max wheelchair distance: 50    Wheelchair 50 feet with 2  turns activity    Assist        Assist Level: Minimal Assistance - Patient > 75%   Wheelchair 150 feet activity     Assist Wheelchair 150 feet activity did not occur: Safety/medical concerns(fatigue)          Medical Problem List and Plan: 1.  Functional deficits secondary to  Left centrum ovale infarct.  Continue CIR, making steady progress 2.  DVT Prophylaxis/Anticoagulation: Pharmaceutical: Lovenox 3. Pain Management: Tylenol as needed 4. Mood: LCSW to follow for evaluation and support 5. Neuropsych: This patient is capable of making decisions on his own behalf. 6. Skin/Wound Care: Routine pressure relief measures 7. Fluids/Electrolytes/Nutrition: Monitor I/os- good p.o. intake for the most part.   BMP within acceptable range on 1/23  Labs ordered for tomorrow 8.  HTN: Monitor blood pressures twice daily.  Continue to hold Cozaar to allow permissive hypertension.  Avoid hypotension.   Relatively controlled on 1/28 9.  Glaucoma: Continue Xalatan right eye at bedtime. 10.  Dyslipidemia: Continue Lipitor. 11.  Sleep disturbance/anxiety: Uses low dose Xanax at bedtime  12. Loose stools: Discontinued Colace.   Improved with more consistent diet 13.  Hypoalbuminemia  Supplement initiated on 1/24  LOS: 6 days A FACE TO FACE EVALUATION WAS PERFORMED  Aleina Burgio Lorie Phenix 04/16/2018, 9:11 AM

## 2018-04-16 NOTE — Progress Notes (Signed)
Social Work Patient ID: [name] Duffy, male   DOB: 10-08-1929, 85 y.o.   MRN: [id number] Spoke with niece and FH-Emily who reports the plan is for pt to go to ALF then back to independent apartment. Will touch base with both tomorrow after meeting and have made aware probable discharge end of the week.

## 2018-04-17 ENCOUNTER — Inpatient Hospital Stay (HOSPITAL_COMMUNITY): Payer: Medicare Other

## 2018-04-17 ENCOUNTER — Inpatient Hospital Stay (HOSPITAL_COMMUNITY): Payer: Medicare Other | Admitting: Occupational Therapy

## 2018-04-17 DIAGNOSIS — R269 Unspecified abnormalities of gait and mobility: Secondary | ICD-10-CM

## 2018-04-17 DIAGNOSIS — I69398 Other sequelae of cerebral infarction: Secondary | ICD-10-CM

## 2018-04-17 NOTE — Progress Notes (Signed)
Occupational Therapy Session Note  Patient Details  Name: [name] Duffy MRN: [id number] Date of Birth: Oct 04, 1929  Today's Date: 04/17/2018 OT Individual Time: 1345-1428   Short Term Goals: Week 1:  OT Short Term Goal 1 (Week 1): Pt will demonstrate improved balance by ambulating in and out of bathroom with cane with S only. OT Short Term Goal 2 (Week 1): Pt will demonstrate improved standing balance and tolerance by being able to stand in shower for 10 min.  OT Short Term Goal 3 (Week 1): Pt will complete toileting with mod I.   Skilled Therapeutic Interventions/Progress Updates:    1:1. Pt greeted in recliner. Pt ambulates with cane to bathroom and stands to void urine with S. Pt demo anterior/posterior sway throughout session but able to maintain balance without A. Pt ambulates with cane to/from all tx spaces with VC for cane use whne carrying object. Pt unable to carry conversation while ambulating (question HOH v attention). Pt stands to unload laundry from dryer and fold with S overall. Pt returns to room ambulating with cane and 1 seated rest break. Pt completes 4x5 sit to stand from recliner with no UE use and VC for anterior weight shift for LE strengthening and endurnace. Exited sessiow iht pt seated in recliner, call light in reach and all needs met  Therapy Documentation Precautions:  Precautions Precautions: Fall Precaution Comments: watch BP Restrictions Weight Bearing Restrictions: No   Therapy/Group: Individual Therapy  Tonny Branch 04/17/2018, 2:28 PM

## 2018-04-17 NOTE — Patient Care Conference (Signed)
Inpatient RehabilitationTeam Conference and Plan of Care Update Date: 04/17/2018   Time: 10:50 AM    Patient Name: [name] Duffy      Medical Record Number: [id number]  Date of Birth: 02-17-1930 Sex: Male         Room/Bed: 4M11C/4M11C-01 Payor Info: Payor: MEDICARE / Plan: MEDICARE PART A AND B / Product Type: *No Product type* /    Admitting Diagnosis: L CVA  Admit Date/Time:  04/10/2018  5:48 PM Admission Comments: No comment available   Primary Diagnosis:  <principal problem not specified> Principal Problem: <principal problem not specified>  Patient Active Problem List   Diagnosis Date Noted  . Hypoalbuminemia due to protein-calorie malnutrition ([place])   . Loose stools   . Essential hypertension   . Glaucoma   . Dyslipidemia   . Sleep disturbance   . Left sided lacunar stroke ([place]) 04/10/2018  . Benign essential HTN   . Tachypnea   . Tachycardia   . Acute CVA (cerebrovascular accident) ([place]) 04/06/2018  . CVA (cerebral vascular accident) ([place]) 04/06/2018  . 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 Open angle with borderline findings, low risk, unspecified eye 10/12/2014  . Primary open angle glaucoma 10/12/2014  . Anxiety state 07/23/2013  . Blood in the urine 01/08/2013  . Genuine stress incontinence, male 12/13/2011  . H/O malignant neoplasm of prostate 01/28/2011    Expected Discharge Date: Expected Discharge Date: 04/19/18  Team Members Present: Physician leading conference: Dr. Alysia Penna Social Worker Present: Ovidio Kin, LCSW Nurse Present: Dorien Chihuahua, RN PT Present: Georjean Mode, PT OT Present: Amy Rounds, OT SLP Present: Stormy Fabian, SLP PPS Coordinator present : Ileana Ladd, PT     Current Status/Progress Goal Weekly Team Focus  Medical   Balance disorder, no significant hemiparesis, chronic urinary frequency at night  Reduce fall risk, reduce secondary stroke risk  Discharge planning   Bowel/Bladder   continent of bowel  and bladder, LBM 04-16-18  remain continent of bowel and bladder, maintain regular bowel program  Assist with toileting needs prn   Swallow/Nutrition/ Hydration             ADL's   Supervision overall using SPC  Mod I   Higher level balance tasks, ADL/IADL re-training, d/c planning   Mobility   supervision overall including gait x 150' except for min assist needed for 1 LOB in crowded environment during turn to R; superivison 12 steps 2 rails  independent basic transfers, supervision car transfers, independent gait x 150' wiht AD, up/down 12 steps 2 rails with supervision  high level balance, safety regarding gait velocity, pt education, mobility, transportation, community re-integration   Communication             Safety/Cognition/ Behavioral Observations            Pain   no c/o pain  0  Assess pain q shift and prn   Skin   no skin issues   no skin issues   Assess skin q shift and prn      *See Care Plan and progress notes for long and short-term goals.     Barriers to Discharge  Current Status/Progress Possible Resolutions Date Resolved   Physician    Medical stability     Progressing towards goals  Continue rehab program, discharge planning      Nursing                  PT  OT                  SLP                SW                Discharge Planning/Teaching Needs:  Plan to go to [place] then transition back to his independent apartment when able. He is doing quite well and making good progress.      Team Discussion:  Goals supervision level and high level balance issues. Pt is doing very well in his therapies. Distant supervision with his cane. Plan to go to ALF at [facility] then transition back to independent apartment after they evaluate him. Work toward discharge Friday. Medically stable  Revisions to Treatment Plan:  DC 1/31    Continued Need for Acute Rehabilitation Level of Care: The patient requires daily medical  management by a physician with specialized training in physical medicine and rehabilitation for the following conditions: Daily direction of a multidisciplinary physical rehabilitation program to ensure safe treatment while eliciting the highest outcome that is of practical value to the patient.: Yes Daily medical management of patient stability for increased activity during participation in an intensive rehabilitation regime.: Yes Daily analysis of laboratory values and/or radiology reports with any subsequent need for medication adjustment of medical intervention for : Neurological problems   I attest that I was present, lead the team conference, and concur with the assessment and plan of the team.   Elease Hashimoto 04/18/2018, 9:46 AM

## 2018-04-17 NOTE — Progress Notes (Signed)
[place] PHYSICAL MEDICINE & REHABILITATION PROGRESS NOTE  Subjective/Complaints:  No issues overnite, completed ADLs with OT  ROS: Denies CP, shortness of breath, nausea, vomiting, diarrhea.  Objective: Vital Signs: Blood pressure 131/86, pulse 69, temperature 98.4 F (36.9 C), temperature source Oral, resp. rate 17, height 6\' 2"  (1.88 m), SpO2 97 %. No results found. No results for input(s): WBC, HGB, HCT, PLT in the last 72 hours. No results for input(s): NA, K, CL, CO2, GLUCOSE, BUN, CREATININE, CALCIUM in the last 72 hours.  Physical Exam: BP 131/86 (BP Location: Right Arm)   Pulse 69   Temp 98.4 F (36.9 C) (Oral)   Resp 17   Ht 6\' 2"  (1.88 m)   SpO2 97%   BMI 20.49 kg/m  Constitutional: No distress . Vital signs reviewed. HENT: Normocephalic.  Atraumatic. Eyes: EOMI. No discharge. Cardiovascular: RRR. No JVD. Respiratory: CTA bilaterally. Normal effort. GI: BS +. Non-distended. Musc: No edema or tenderness in extremities. Neurological: He is alert and oriented  [facility] Speech clear.  Able to follow basic one and two step commands without difficulty.  Motor: Bilateral upper extremities 5/5 proximal to distal Left lower extremity: 5/5 proximal distal, stable Right lower extremity: 4+/5 proximal to distal, stable Psych: Normal mood.  Normal affect. Skin: Venous stasis changes bilateral lower extremities  Assessment/Plan: 1. 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  Physiatrist is providing close team supervision and 24 hour management of active medical problems listed below.  Physiatrist and rehab team continue to assess barriers to discharge/monitor patient progress toward functional and medical goals  Care Tool:  Bathing    Body parts bathed by patient: Right arm, Left arm, Chest, Abdomen, Front perineal area, Buttocks, Right upper leg,  Left upper leg, Right lower leg, Left lower leg, Face         Bathing assist Assist Level: Supervision/Verbal cueing     Upper Body Dressing/Undressing Upper body dressing   What is the patient wearing?: Pull over shirt, Button up shirt    Upper body assist Assist Level: Independent    Lower Body Dressing/Undressing Lower body dressing      What is the patient wearing?: Underwear/pull up, Pants     Lower body assist Assist for lower body dressing: Supervision/Verbal cueing     Toileting Toileting    Toileting assist Assist for toileting: Supervision/Verbal cueing     Transfers Chair/bed transfer  Transfers assist     Chair/bed transfer assist level: Supervision/Verbal cueing     Locomotion Ambulation   Ambulation assist      Assist level: Supervision/Verbal cueing Assistive device: Cane-straight Max distance: 150 ft   Walk 10 feet activity   Assist     Assist level: Supervision/Verbal cueing Assistive device: Cane-straight   Walk 50 feet activity   Assist    Assist level: Supervision/Verbal cueing Assistive device: Cane-straight    Walk 150 feet activity   Assist    Assist level: Supervision/Verbal cueing Assistive device: Cane-straight    Walk 10 feet on uneven surface  activity   Assist Walk 10 feet on uneven surfaces activity did not occur: Safety/medical concerns   Assist level: Contact Guard/Touching assist Assistive device: Cane-straight   Wheelchair     Assist Will patient use wheelchair at discharge?: No Type of Wheelchair: Manual    Wheelchair assist level: Minimal Assistance - Patient > 75% Max wheelchair distance: 50  Wheelchair 50 feet with 2 turns activity    Assist        Assist Level: Minimal Assistance - Patient > 75%   Wheelchair 150 feet activity     Assist Wheelchair 150 feet activity did not occur: Safety/medical concerns(fatigue)          Medical Problem List and  Plan: 1.  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  Continue CIR,team conf today 2.  DVT Prophylaxis/Anticoagulation: Pharmaceutical: Lovenox 3. Pain Management: Tylenol as needed 4. Mood: LCSW to follow for evaluation and support 5. Neuropsych: This patient is capable of making decisions on his own behalf. 6. Skin/Wound Care: Routine pressure relief measures 7. Fluids/Electrolytes/Nutrition: Monitor I/os- good p.o. intake for the most part.   BMP within acceptable range on 1/23, repeat 1/20  8.  HTN: Monitor blood pressures twice daily.  Continue to hold Cozaar to allow permissive hypertension.  Avoid hypotension.    Vitals:   04/16/18 1957 04/17/18 0521  BP: 118/76 131/86  Pulse: 76 69  Resp: 18 17  Temp: 98.6 F (37 C) 98.4 F (36.9 C)  SpO2: 96% 97%  controlled 1/29 9.  Glaucoma: Continue Xalatan right eye at bedtime. 10.  Dyslipidemia: Continue Lipitor. 11.  Sleep disturbance/anxiety: Uses low dose Xanax at bedtime  12. Loose stools: Discontinued Colace.   Improved with more consistent diet 13.  Hypoalbuminemia  Supplement initiated on 1/24  LOS: 7 days A FACE TO FACE EVALUATION WAS PERFORMED  Charlett Blake 04/17/2018, 8:26 AM

## 2018-04-17 NOTE — Progress Notes (Signed)
Occupational Therapy Session Note  Patient Details  Name: [name] Duffy MRN: [id number] Date of Birth: 09/07/1929  Today's Date: 04/17/2018 OT Individual Time: 3762-8315 and [address] OT Individual Time Calculation (min): 60 min and 28 min   Short Term Goals: Week 1:  OT Short Term Goal 1 (Week 1): Pt will demonstrate improved balance by ambulating in and out of bathroom with cane with S only. OT Short Term Goal 2 (Week 1): Pt will demonstrate improved standing balance and tolerance by being able to stand in shower for 10 min.  OT Short Term Goal 3 (Week 1): Pt will complete toileting with mod I.   Skilled Therapeutic Interventions/Progress Updates:    Session One: Pt seen for OT ADL bathing/dressing session. Pt sitting up in bed upon arrival, eating breakfast and agreeable to tx session. He voiced some muscle discomfort in back of neck, shower and stretching provided for comfort and RN to apply pain patch at end of session. Throughout session, he completed functional ambulation with supervision, intermittently using SPC for balance. He gathered clothing items from overhead closet and low drawers with supervision.  Ambulated into bathroom and completed toileting task and transfers with supervision using grab bar. He transitioned to shower and bathed standing up, using grab bar intermittently for balance support. He returned to EOB to dress, distant supervision with VCs to complete tasks possible from sitting position in order to reduce risk of fall.  Grooming tasks completed standing at sink, distant supervision-mod I level.  Pt left seated in recliner at end of session, all needs in reach with MD present compleiing AM [name].   Session Two: Pt seen for OT session focusing on functional standing balance/endurance during IADL tasks. Pt sitting up in recliner upon arrival, agreeable to tx session and denying pain. He had questions regarding d/c plan following team conference meeting this  morning. Plan for pt to d/c to ALF on Friday before transitioning to independent living per his families request. Pt agreeable with this plan. He ambulated throughout room to complete toileting task, requiring VCs for [facility] management during functional context. Completed toileting task in standing with supervision and use of grab bars to assist with balance. Hand hygiene standing at sink with supervision. He ambulated throughout unit carrying laundry bag and using SPC with supervision to pt laundry facility. Pt completed laundry task, loading top end washer and managing detergent bottle with supervision. He returned to room in same manner as described above. Pt left seated in recliner at end of session, awaiting lunch tray with all needs in reach.   Therapy Documentation Precautions:  Precautions Precautions: Fall Precaution Comments: watch BP Restrictions Weight Bearing Restrictions: No   Therapy/Group: Individual Therapy  Kayse Puccini L 04/17/2018, 7:07 AM

## 2018-04-17 NOTE — Progress Notes (Addendum)
Physical Therapy Session Note  Patient Details  Name: [name] Duffy MRN: [id number] Date of Birth: 03-07-1930  Today's Date: 04/17/2018 PT Individual Time: 1507-1610 PT Individual Time Calculation (min): 63 min   Short Term Goals: Week 1:  PT Short Term Goal 1 (Week 1): pt will transfer with supervision PT Short Term Goal 2 (Week 1): pt will perform gait x 150' with supervision PT Short Term Goal 3 (Week 1): pt will manange 12 steps 2 rails with supervision PT Short Term Goal 4 (Week 1): pt will voice understanding of scoliosis/balance   Skilled Therapeutic Interventions/Progress Updates:   Pt sitting in recliner.  Gait training with pt's wooden SPC vs CIR metal offset handle SPC, on level tile x 60' each, with supervision.  Pt prefers his SPC.  He holds it in his R hand per habit.  Up/down 12 steps 2 railings with supervision, step through method, with cues for progressing R hand.  Community reintegration, browsing in gift shop with [place], x 150'.  Pt tends to turn quickly, and needed min assist x 1 for LOB R as he scissored LLE in front of RLE as he turned R.  Floor transfer with supervision.  Discussed use of call bell that he will have at [place] section of [employer], and keeping his cell phone with him.  Discussed fall recovery and when it is appropriate for him to arise from floor.  Therapeutic activity on hands and knees, wiping down mat with gloves on.  Pt was attentive and used R hand without fatigue.   Advanced gait training, weaving in/out of cones about 30" apart, requiring 8 tight turns.  With min cues initially, pt slowed down and safely used his SPC and avoided scissoring LEs during turns.  Further advanced gait training, kicking a Yoga block with L foot while ambulating; 1 LOB R in 100'; when kicking with R foot, 0 LOB in 50'.  Pt stated that it was much easier to kick with R foot.  PT educated pt on his particular balance problems, ie drifting R with R single limb  stance.  Pt stated that he needed to urinate.  Pt stood to urinate, without UE support, but did not void after a couple of minutes.  He stated that this was unusual.  PT discussed safety at night for urinating, recommending that pt sit down on toilet.  He was agreeable to this.  Pt left resting in recliner with seat alarm set and needs at hand.     Therapy Documentation Precautions:  Precautions Precautions: Fall Precaution Comments: watch BP Restrictions Weight Bearing Restrictions: No   Pain: pt denies    Therapy/Group: Individual Therapy  Abisai Deer 04/17/2018, 4:24 PM

## 2018-04-17 NOTE — Progress Notes (Signed)
Occupational Therapy Discharge Summary  Patient Details  Name: [name] Duffy MRN: [id number] Date of Birth: 06/18/1929   tx session 1: 55 min 1:1. No a/o pain. Pt received in bed agreeable to bathing and dressing this session at shower level. Pt completes ambulation with SPC for all functional transfers. Pt completes toileting with MOD I and elects to sit to have B&B void. OT educates on energy conservation and safety awareness that these are the same principles and reasoning for sitting while showering, however pt declines getting shower chair "until I move back to ILF." PT bathes in standing with MOD I using grab bar to steady intermittently d/t increased sway. Pt dresses EOB with min A for sitting/standing balance demonstrating new posterior and L lateral lean pt unable to correct. Pt demo 1 LOB posteriorly pt unable to correct and OT A pt to slowly sit onto bed. Pt frustrated saying, "I just cant help my balance." PT, RN and MD alerted to new balance issues. Pt with PMHx of vertigo causing previous falls. Educated pt to dress in chair with firm back and arm rests to aide with sitting balance issues and power up into standing as opposed to dressing EOB. Exited session with pt seated in recliner, set up with breakfast and exit alarm on.  Session 2: 75 min  1;1. Pt received in recliner. Pt completes toileting at MOD I level ambulating with cane. Pt practices LB dressing, doffing and donning clothing from recliner with MOD I. Pt demo no LOB in sitting or standing this afternoon. Educated pt on dressing at ALF seated in chair with armrests to provide support as needed. Pt verbalized understanding. Pt completes shaving standing at sink with MOD I with electric razor. Pt standing on compliant surface and foam wedge while completing no sew blanket activity with focus on R FMC, standing balance and endurance working for 15 min without seated rest break. Pt completes seated ball toss (chest, bounce and  overhead pass) 3x30 with 1# wrist weights on BUE for strengthening and coordination. Pt continues to demo significant posterior pelvic tilt and kyphosis which is more pronounced in sitting. Exited session with pt returned to room, seated in recliner, exit alarm on and call light in reach.  Patient has met 10 of 10 long term goals due to improved activity tolerance, improved balance, postural control and improved coordination.  Patient to discharge at overall Modified Independent level.  Patient to d/c to ALF before potential transition back to independent living.  Pt using SPC for all ambulation and functional transfers. He bathes from standing position in shower. Have provided education and recommendation to complete from sitting position due to pt's high fall risk, however, pt insistent on standing and demonstrates good safety awareness when completing ADLs from standing position.   Recommendation:  Patient will benefit from ongoing skilled OT services in home health setting to continue to advance functional skills in the area of BADL and iADL.  Equipment: To be determined at next venue of care  Reasons for discharge: treatment goals met and discharge from hospital  Patient/family agrees with progress made and goals achieved: Yes  OT Discharge Precautions/Restrictions  Precautions Precautions: Fall Restrictions Weight Bearing Restrictions: No Vision Baseline Vision/History: Wears glasses Wears Glasses: At all times Patient Visual Report: No change from baseline Additional Comments: Cataract surgery 8 months ago, blurry since then Perception  Perception: Within Functional Limits Praxis Praxis: Intact Cognition Overall Cognitive Status: Within Functional Limits for tasks assessed Arousal/Alertness: Awake/alert Orientation Level: Oriented  X4 Memory: Appears intact Awareness: Appears intact Problem Solving: Appears intact Safety/Judgment: Appears intact Comments: Some decreased  insight into his high fall risk and taking steps to minimize fall risks Sensation Sensation Light Touch: Appears Intact Coordination Gross Motor Movements are Fluid and Coordinated: Yes Fine Motor Movements are Fluid and Coordinated: Yes Motor  Motor Motor: Within Functional Limits Trunk/Postural Assessment  Cervical Assessment Cervical Assessment: Exceptions to WFL(Forward head; neck stiffness PTA) Thoracic Assessment Thoracic Assessment: Exceptions to WFL(Kyphotic) Lumbar Assessment Lumbar Assessment: Exceptions to WFL(Posterior pelvic tilt) Postural Control Postural Control: Within Functional Limits  Balance Balance Balance Assessed: Yes Dynamic Sitting Balance Dynamic Sitting - Balance Support: During functional activity;Feet supported;No upper extremity supported Dynamic Sitting - Level of Assistance: 7: Independent Static Standing Balance Static Standing - Balance Support: During functional activity;No upper extremity supported Static Standing - Level of Assistance: 6: Modified independent (Device/Increase time) Static Standing - Comment/# of Minutes: Standing to complete grooming tasks at sink Dynamic Standing Balance Dynamic Standing - Balance Support: During functional activity;Right upper extremity supported;No upper extremity supported Dynamic Standing - Level of Assistance: 6: Modified independent (Device/Increase time) Dynamic Standing - Comments: Standing to complete bathing task at shower level Extremity/Trunk Assessment RUE Assessment Active Range of Motion (AROM) Comments: limited shoulder flexion due to arthritis LUE Assessment LUE Assessment: Within Functional Limits Active Range of Motion (AROM) Comments: limited shoulder flexion due to arthritis   Rounds, Amy L 04/17/2018, 3:24 PM

## 2018-04-17 NOTE — Progress Notes (Signed)
Social Work Patient ID: [name] Duffy, male   DOB: 05-04-1929, 85 y.o.   MRN: [id number] Met with pt and spoke with niece per telephone and also Emily-Admission at [facility] to discuss team conference goals supervision level and discharge date 1/31. Niece and nephew to be here Friday and will transport pt over to [place] and then from there will re-evaluate and see when can return to his independent living apartment. He is agreeable and will be glad to get back closer to his wife he has missed her. Will work toward discharge Friday.

## 2018-04-18 ENCOUNTER — Inpatient Hospital Stay (HOSPITAL_COMMUNITY): Payer: Medicare Other

## 2018-04-18 LAB — BASIC METABOLIC PANEL
Anion gap: 5 (ref 5–15)
BUN: 19 mg/dL (ref 8–23)
CO2: 28 mmol/L (ref 22–32)
Calcium: 9.3 mg/dL (ref 8.9–10.3)
Chloride: 108 mmol/L (ref 98–111)
Creatinine, Ser: 0.92 mg/dL (ref 0.61–1.24)
GFR calc Af Amer: >60 mL/min (ref >60)
GFR calc non Af Amer: >60 mL/min (ref >60)
Glucose, Bld: 97 mg/dL (ref 70–99)
Potassium: 4 mmol/L (ref 3.5–5.1)
Sodium: 141 mmol/L (ref 135–145)

## 2018-04-18 NOTE — Progress Notes (Signed)
Social Work  Discharge Note  The overall goal for the admission was met for:   Discharge location: Yes-[facility]  Length of Stay: Yes-9 DAYS  Discharge activity level: Yes-SUPERVISION-MOD/I LEVEL  Home/community participation: Yes  Services provided included: MD, RD, PT, OT, RN, CM, Pharmacy and SW  Financial Services: Medicare and Private Insurance: [place]  Follow-up services arranged: Other: ALF  Comments (or additional information):PT TO GO TO ALF AND THEN ONCE EVALUATED AND FEELS SAFE TO RETURN TO HIS INDEPENDENT APARTMENT  Patient/Family verbalized understanding of follow-up arrangements: Yes  Individual responsible for coordination of the follow-up plan: LORI-NIECE & CHARLES-NEPHEW  Confirmed correct DME delivered: Elease Hashimoto 04/18/2018    Elease Hashimoto

## 2018-04-18 NOTE — Progress Notes (Signed)
[facility] PHYSICAL MEDICINE & REHABILITATION PROGRESS NOTE  Subjective/Complaints:  Per OT had poor sitting balance this am.  Pt denies dizziness , increased weakness, speech or swallow issues or increased numbness.    ROS: Denies CP, shortness of breath, nausea, vomiting, diarrhea.  Objective: Vital Signs: Blood pressure 135/86, pulse 79, temperature 97.7 F (36.5 C), temperature source Oral, resp. rate 14, height 6\' 2"  (1.88 m), SpO2 98 %. No results found. No results for input(s): WBC, HGB, HCT, PLT in the last 72 hours. Recent Labs    04/18/18 0528  NA 141  K 4.0  CL 108  CO2 28  GLUCOSE 97  BUN 19  CREATININE 0.92  CALCIUM 9.3    Physical Exam: BP 135/86 (BP Location: Right Arm)   Pulse 79   Temp 97.7 F (36.5 C) (Oral)   Resp 14   Ht 6\' 2"  (1.88 m)   SpO2 98%   BMI 20.49 kg/m  Constitutional: No distress . Vital signs reviewed. HENT: Normocephalic.  Atraumatic. Eyes: EOMI. No discharge. Cardiovascular: RRR. No JVD. Respiratory: CTA bilaterally. Normal effort. GI: BS +. Non-distended. Musc: No edema or tenderness in extremities. Neurological: He is alert and oriented  HOH Neuro:  Eyes without evidence of nystagmus  Tone is normal without evidence of spasticity Cerebellar exam shows no evidence of ataxia on finger nose finger or heel to shin testing No evidence of trunkal ataxia Mild fine motor def LUE  Standing balance good , no lateropulsion  Motor strength is 5/5 in bilateral deltoid, biceps, triceps, finger flexors and extensors, wrist flexors and extensors, hip flexors, knee flexors and extensors, ankle dorsiflexors, plantar flexors, invertors and evertors, toe flexors and extensors  Sensory exam is normal to pinprick, proprioception and light touch in the upper and lower limbs   Cranial nerves II- Visual fields are intact to confrontation testing, no blurring of vision III- no evidence of ptosis, upward, downward and medial gaze intact IV- no  vertical diplopia or head tilt V- no facial numbness or masseter weakness VI- no pupil abduction weakness VII- no facial droop, good lid closure VII- normal auditory acuity IX- no pharygeal weakness, gag nl X- no pharyngeal weakness, no hoarseness XI- no trap or SCM weakness XII- no glossal weakness    Assessment/Plan: 1. 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  Physiatrist is providing close team supervision and 24 hour management of active medical problems listed below.  Physiatrist and rehab team continue to assess barriers to discharge/monitor patient progress toward functional and medical goals  Care Tool:  Bathing    Body parts bathed by patient: Right arm, Left arm, Chest, Abdomen, Front perineal area, Buttocks, Right upper leg, Left upper leg, Right lower leg, Left lower leg, Face         Bathing assist Assist Level: Supervision/Verbal cueing     Upper Body Dressing/Undressing Upper body dressing   What is the patient wearing?: Pull over shirt, Button up shirt    Upper body assist Assist Level: Independent    Lower Body Dressing/Undressing Lower body dressing      What is the patient wearing?: Underwear/pull up, Pants     Lower body assist Assist for lower body dressing: Supervision/Verbal cueing     Toileting Toileting    Toileting assist Assist for toileting: Supervision/Verbal cueing     Transfers Chair/bed transfer  Transfers assist     Chair/bed transfer  assist level: Supervision/Verbal cueing     Locomotion Ambulation   Ambulation assist      Assist level: Minimal Assistance - Patient > 75% Assistive device: Cane-straight Max distance: 150 ft   Walk 10 feet activity   Assist     Assist level: Supervision/Verbal cueing Assistive device: Cane-straight   Walk 50 feet activity   Assist    Assist  level: Supervision/Verbal cueing Assistive device: Cane-straight    Walk 150 feet activity   Assist    Assist level: Minimal Assistance - Patient > 75% Assistive device: Cane-straight    Walk 10 feet on uneven surface  activity   Assist Walk 10 feet on uneven surfaces activity did not occur: Safety/medical concerns   Assist level: Contact Guard/Touching assist Assistive device: Hospital doctor     Assist Will patient use wheelchair at discharge?: No Type of Wheelchair: Manual    Wheelchair assist level: Minimal Assistance - Patient > 75% Max wheelchair distance: 50    Wheelchair 50 feet with 2 turns activity    Assist        Assist Level: Minimal Assistance - Patient > 75%   Wheelchair 150 feet activity     Assist Wheelchair 150 feet activity did not occur: Safety/medical concerns(fatigue)          Medical Problem List and Plan: 1.  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  Continue CIR,PT, OT Episode of poor balance resolved will check orthostatic vitals, consider vestibular eval 2.  DVT Prophylaxis/Anticoagulation: Pharmaceutical: Lovenox 3. Pain Management: Tylenol as needed 4. Mood: LCSW to follow for evaluation and support 5. Neuropsych: This patient is capable of making decisions on his own behalf. 6. Skin/Wound Care: Routine pressure relief measures 7. Fluids/Electrolytes/Nutrition: Monitor I/os- good p.o. intake for the most part.   BMP within acceptable range on 1/23, repeat 1/20  8.  HTN: Monitor blood pressures twice daily.  Continue to hold Cozaar to allow permissive hypertension.  Avoid hypotension.    Vitals:   04/17/18 1955 04/18/18 0539  BP: 126/76 135/86  Pulse: 86 79  Resp: 14 14  Temp: 98.7 F (37.1 C) 97.7 F (36.5 C)  SpO2: 97% 98%  controlled 1/30 9.  Glaucoma: Continue Xalatan right eye at bedtime. 10.  Dyslipidemia: Continue Lipitor. 11.  Sleep disturbance/anxiety: Uses low  dose Xanax at bedtime  12. Loose stools: Discontinued Colace.   Improved with more consistent diet 13.  Hypoalbuminemia  Supplement initiated on 1/24  LOS: 8 days A FACE TO [place] E Bradely Rudin 04/18/2018, 8:33 AM

## 2018-04-18 NOTE — Discharge Summary (Addendum)
Physician Discharge Summary  Patient ID: [name] Duffy MRN: [id number] DOB/AGE: January 07, 1930 85 y.o.  Admit date: 04/10/2018 Discharge date: 04/19/2018  Discharge Diagnoses:  Principal Problem:   Left sided lacunar stroke [facility]) Active Problems:   Essential hypertension   Glaucoma   Dyslipidemia   Sleep disturbance   Hypoalbuminemia due to protein-calorie malnutrition [facility])   Discharged Condition: stable   Labs:  Basic Metabolic Panel: BMP Latest Ref Rng & Units 04/18/2018 04/11/2018 04/07/2018  Glucose 70 - 99 mg/dL 97 101(H) 91  BUN 8 - 23 mg/dL 19 11 13   Creatinine 0.61 - 1.24 mg/dL 0.92 0.92 0.95  Sodium 135 - 145 mmol/L 141 138 139  Potassium 3.5 - 5.1 mmol/L 4.0 3.8 4.1  Chloride 98 - 111 mmol/L 108 106 105  CO2 22 - 32 mmol/L 28 24 23   Calcium 8.9 - 10.3 mg/dL 9.3 9.7 10.2    CBC: CBC Latest Ref Rng & Units 04/11/2018 04/07/2018 04/06/2018  WBC 4.0 - 10.5 K/uL 7.2 6.9 7.8  Hemoglobin 13.0 - 17.0 g/dL 14.4 15.8 15.3  Hematocrit 39.0 - 52.0 % 43.9 49.2 47.3  Platelets 150 - 400 K/uL 202 179 185    CBG: Recent Labs  Lab 04/12/18 2126 04/13/18 0717 04/13/18 1136 04/14/18 0818 04/15/18 0701  GLUCAP 112* 94 107* 120* 92     Brief HPI:   [name] Duffy is an 85 year old Rh-male with history of HTN, prostate cancer, recent falls; who was admitted on 04/06/2018 with fall prior to the admission and difficulty walking/weightbearing on RLE.  MRI brain revealed small left centrum ovale infarct.  MRI cervical spine lumbar spine showed scoliosis with multilevel DDD.  2D echo showed mild LVH with EF 60 to 65% and question of LA mass.    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  Cardiac MRI done and was negative for cardiac mass.  Neurology recommends aspirin Plavix x3 weeks followed by Plavix alone for stroke due to small vessel disease.  Therapy ongoing and patient was limited by unsteady gait with balance deficits.  CIR  recommended due to functional deficits   Hospital Course: [name] Duffy was admitted to rehab 04/10/2018 for inpatient therapies to consist of PT, ST and OT at least three hours five days a week. Past admission physiatrist, therapy team and rehab RN have worked together to provide customized collaborative inpatient rehab.  Follow-up labs shows renal status is stable and lites within normal limits.  Hypoalbuminemia noted at admission and nutritional supplements were added.  He is tolerating aspirin and Plavix without side effects.  Blood pressures and heart rate has been stable off Cozaar.  He did have transient episode of balance deficits due to vertigo that resolved later that day. Loose stools have resolved with d/c of laxatives.   He has made great great progress during his rehab stay and is at modified independent level.  He will continue to receive further follow-up home health PT and OT after discharge   Rehab course: During patient's stay in rehab team conference was held to monitor patient's progress, set goals and discuss barriers to discharge. At admission, patient required Min assist with ADL tasks and mobility. He has had improvement in activity tolerance, balance, postural control as well as ability to compensate for deficits. He has had improvement in functional use RUE  and RLE as well as improvement in awareness. He is able to complete ADL tasks at modified independent level. He is modified independent for transfers  and for ambulating 150 feet with straight cane.  Plans are to [facility] [address]    Disposition: Assisted Living Facility  Diet: [facility]  Special Instructions: 1. Stop taking Aspirin after Feb 8th.  2. Note decrease in Xanax dose.   Allergies as of 04/19/2018      Reactions   Rofecoxib Swelling   REACTION: swelling- Vioxx name brand       Medication List    STOP taking these medications   docusate sodium  100 MG capsule Commonly known as:  COLACE     TAKE these medications   acetaminophen 325 MG tablet Commonly known as:  TYLENOL Take 1-2 tablets (325-650 mg total) by mouth every 4 (four) hours as needed for mild pain.   ALIGN PO Take 1 capsule by mouth daily.   ALPRAZolam 0.25 MG tablet Commonly known as:  XANAX Take 1 tablet (0.25 mg total) by mouth 2 (two) times daily as needed for anxiety or sleep.   aspirin EC 81 MG tablet Take 81 mg by mouth at bedtime. Notes to patient:  Stop taking after Feb 8th.    atorvastatin 20 MG tablet Commonly known as:  LIPITOR Take 1 tablet (20 mg total) by mouth daily at 6 PM.   citalopram 20 MG tablet Commonly known as:  CELEXA Take 1 tablet (20 mg total) by mouth daily.   clopidogrel 75 MG tablet Commonly known as:  PLAVIX Take 1 tablet (75 mg total) by mouth daily.   lidocaine 5 % Commonly known as:  LIDODERM Place 1 patch onto the skin daily.--on at 8 am and off 8 pm apply to posterior neck. available  over the counter.    polyethylene glycol packet Commonly known as:  MIRALAX / GLYCOLAX Take 17 g by mouth daily as needed for mild constipation.   XALATAN 0.005 % ophthalmic solution Generic drug:  latanoprost Place 1 drop into the right eye at bedtime.       Contact information for follow-up providers    Jamse Arn, MD Follow up.   Specialty:  Physical Medicine and Rehabilitation Why:  Office will call you with follow up appointment Contact information: [place] [address] [phone]        [facility]. Call.   Why:  for follow up appointment Contact information: [place] [address] [phone]       Burnard Bunting, MD. Call.   Specialty:  Internal Medicine Why:  for post hospital follow up. Contact information: [address] [place] [address] [phone]            Contact information for  after-discharge care    Destination    [facility] .   Service:  Assisted Living Contact information: [place] [phone]                  Signed: Bary Leriche 04/19/2018, 1:22 PM

## 2018-04-18 NOTE — Progress Notes (Signed)
Physical Therapy Discharge Summary  Patient Details  Name: [name] Duffy MRN: [id number] Date of Birth: 1929-04-12  Today's Date: 04/18/2018 PT Individual Time: 0920-1020 PT Individual Time Calculation (min): 60 min    Patient has met 12 of 12 long term goals due to improved activity tolerance, improved balance, improved postural control, ability to compensate for deficits, functional use of  right lower extremity and improved awareness.  Patient to discharge at an ambulatory level Modified Independent.   Patient's care partner not needed to provide assistance.  [name] has paraplegia, is in SNF at [facility].  Pt to d/c to ALF at [facility].  Reasons goals not met: na  Recommendation:  Patient will benefit from ongoing skilled PT services in home health setting to continue to advance safe functional mobility, address ongoing impairments in high level balance, questionable vertigo/vestibular issue, , cervical pain with rotation , and minimize fall risk.  Equipment: [facility]  Reasons for discharge: treatment goals met and discharge from hospital  Patient/family agrees with progress made and goals achieved: Yes  PT Discharge OT consulted with PT prior to tx session.  Pt had LOB during shower this AM.  Pt described this as possible vertigo; he drifted L.  At admission, he drifted R toward hemi side, and that has resolved.  PT saw pt later in AM; he had no LOB.  With extensive discussion, pt stated that he had vertigo 40 years ago, and once in the last few months due to his hearing aides being cleaned.  [name] is being moved into SNF and pt will d/c to ALF at [facility].  Pt participated well in simulated car transfer with supervision, gait on ramp with supervision and SPC , gait over mulched area with CGA. Berg repeated; see below. Pt's scoliosis, cervical pain with movement, and mild CVA all contribute to his balance; vestibular screen or eval may be warranted.  Pt has agreed  to sit on the toilet to urinate or use urinal, to void during the night, 2-3x/ PTA.  Pt left resting in recliner in his room, with needs at hand. Precautions/Restrictions Precautions Precautions: Fall Restrictions Weight Bearing Restrictions: No   Pain none at rest; painful cervical rotation L and R   Vision/Perception  Vision - Assessment Additional Comments: blurry vision since cataract surgery 8 months ago, questionably slightly worse since CVA Perception Perception: Within Functional Limits Praxis Praxis: Intact  Cognition Overall Cognitive Status: Within Functional Limits for tasks assessed Arousal/Alertness: Awake/alert Orientation Level: Oriented X4 Attention: Selective Memory: Appears intact Problem Solving: Appears intact Reasoning: Appears intact Safety/Judgment: Appears intact Comments: has improved in insight regarding fall risks Sensation Sensation Light Touch: Appears Intact Proprioception: Appears Intact Coordination Heel Shin Test: = bil Motor  Motor Motor: Within Functional Limits Motor - Discharge Observations: r lean in standing resolved  Mobility Bed Mobility Bed Mobility: Rolling Right;Rolling Left Rolling Right: Independent Rolling Left: Independent Transfers Sit to Stand: Independent with assistive device Stand to Sit: Independent with assistive device Stand Pivot Transfers: Independent with assistive device Locomotion  Gait Ambulation: Yes Gait Assistance: Independent with assistive device Gait Distance (Feet): 150 Feet Assistive device: Straight cane Gait Gait: Yes Gait Pattern: Step-through pattern;Decreased trunk rotation;Trunk flexed Stairs / Additional Locomotion Stairs: Yes Stairs Assistance: Supervision/Verbal cueing Stair Management Technique: Two rails Number of Stairs: 12 Height of Stairs: 6(and 3) Ramp: Supervision/Verbal cueing Wheelchair Mobility Wheelchair Mobility: No  Trunk/Postural Assessment  Cervical  Assessment Cervical Assessment: Exceptions to WFL(limited extension; all movements painful)  Thoracic Assessment Thoracic Assessment: Exceptions to WFL(kyphotic wiht slightly more prominent R rib hump) Lumbar Assessment Lumbar Assessment: Exceptions to WFL(severe posterior pelvic tilt) Postural Control Postural Control: Deficits on evaluation(head tilt to R in standing, due to cervical spine pain/pathology) Trunk Control: improved since admission Righting Reactions: delayed, see OT note of 1/30 Postural Limitations: pt has scoliosis; R waist crease, shoulders shifted L  Balance Balance Balance Assessed: Yes Standardized Balance Assessment Standardized Balance Assessment: Berg Balance Test Berg Balance Test Sit to Stand: Able to stand  independently using hands Standing Unsupported: Able to stand 2 minutes with supervision Sitting with Back Unsupported but Feet Supported on Floor or Stool: Able to sit safely and securely 2 minutes Stand to Sit: Sits safely with minimal use of hands Transfers: Able to transfer safely, minor use of hands Standing Unsupported with Eyes Closed: Able to stand 10 seconds with supervision Standing Ubsupported with Feet Together: Able to place feet together independently and stand for 1 minute with supervision From Standing, Reach Forward with Outstretched Arm: Can reach forward >12 cm safely (5") From Standing Position, Pick up Object from Floor: Able to pick up shoe, needs supervision From Standing Position, Turn to Look Behind Over each Shoulder: Needs supervision when turning Turn 360 Degrees: Needs close supervision or verbal cueing Standing Unsupported, Alternately Place Feet on Step/Stool: Able to complete >2 steps/needs minimal assist Standing Unsupported, One Foot in Front: Needs help to step but can hold 15 seconds Standing on One Leg: Tries to lift leg/unable to hold 3 seconds but remains standing independently Total Score: 35 Dynamic Sitting  Balance Dynamic Sitting - Level of Assistance: 7: Independent Static Standing Balance Static Standing - Level of Assistance: 6: Modified independent (Device/Increase time) Static Stance: Eyes closed Static Stance: Eyes Closed: minimal swaying, no LOB Dynamic Standing Balance Dynamic Standing - Level of Assistance: 6: Modified independent (Device/Increase time) Dynamic Standing - Balance Activities: Kicking ball(with cane in r hand) Extremity Assessment      RLE Assessment RLE Assessment: Exceptions to [facility] Passive Range of Motion (PROM) Comments: tight hamstrings and heel cords General Strength Comments: grossly in sitting: hip flexion 4/5, hip abd/adduction , knee extension and ankle DF 4+/5 LLE Assessment LLE Assessment: Within Functional Limits Passive Range of Motion (PROM) Comments: tight hamstrings and heel cords    Aedon Deason 04/18/2018, 5:52 PM

## 2018-04-19 DIAGNOSIS — R278 Other lack of coordination: Secondary | ICD-10-CM | POA: Diagnosis not present

## 2018-04-19 DIAGNOSIS — M6281 Muscle weakness (generalized): Secondary | ICD-10-CM | POA: Diagnosis not present

## 2018-04-19 DIAGNOSIS — R2681 Unsteadiness on feet: Secondary | ICD-10-CM | POA: Diagnosis not present

## 2018-04-19 MED ORDER — CITALOPRAM HYDROBROMIDE 20 MG PO TABS: 20.0000 mg | ORAL_TABLET | Freq: Every day | ORAL | 0 refills | Status: DC

## 2018-04-19 MED ORDER — ACETAMINOPHEN 325 MG PO TABS: 325.0000 mg | ORAL_TABLET | ORAL | Status: DC | PRN

## 2018-04-19 MED ORDER — CLOPIDOGREL BISULFATE 75 MG PO TABS: 75.0000 mg | ORAL_TABLET | Freq: Every day | ORAL | 0 refills | Status: DC

## 2018-04-19 MED ORDER — ALPRAZOLAM 0.25 MG PO TABS: 0.2500 mg | ORAL_TABLET | Freq: Two times a day (BID) | ORAL | 0 refills | Status: DC | PRN

## 2018-04-19 MED ORDER — LIDOCAINE 5 % EX PTCH: 1.0000 | MEDICATED_PATCH | CUTANEOUS | 0 refills | Status: DC

## 2018-04-19 NOTE — Progress Notes (Signed)
[place] PHYSICAL MEDICINE & REHABILITATION PROGRESS NOTE  Subjective/Complaints:  Rest of day went fine, pt aware of AL d/c today   ROS: Denies CP, shortness of breath, nausea, vomiting, diarrhea.  Objective: Vital Signs: Blood pressure (!) 140/91, pulse 76, temperature 97.7 F (36.5 C), temperature source Oral, resp. rate 12, height 6\' 2"  (1.88 m), SpO2 98 %. No results found. No results for input(s): WBC, HGB, HCT, PLT in the last 72 hours. Recent Labs    04/18/18 0528  NA 141  K 4.0  CL 108  CO2 28  GLUCOSE 97  BUN 19  CREATININE 0.92  CALCIUM 9.3    Physical Exam: BP (!) 140/91 (BP Location: Left Arm)   Pulse 76   Temp 97.7 F (36.5 C) (Oral)   Resp 12   Ht 6\' 2"  (1.88 m)   SpO2 98%   BMI 20.49 kg/m  Constitutional: No distress . Vital signs reviewed. HENT: Normocephalic.  Atraumatic. Eyes: EOMI. No discharge. Cardiovascular: RRR. No JVD. Respiratory: CTA bilaterally. Normal effort. GI: BS +. Non-distended. Musc: No edema or tenderness in extremities. Neurological: He is alert and oriented  HOH Neuro:  Eyes without evidence of nystagmus  Tone is normal without evidence of spasticity Cerebellar exam shows no evidence of ataxia on finger nose finger or heel to shin testing No evidence of trunkal ataxia Mild fine motor def LUE        Assessment/Plan: 1. 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 Stable for D/C today F/u PCP in 3-4 weeks F/u PM&R 2 weeks See D/C summary See D/C instructions Care Tool:  Bathing    Body parts bathed by patient: Right arm, Left arm, Chest, Abdomen, Front perineal area, Buttocks, Right upper leg, Left upper leg, Right lower leg, Left lower leg, Face         Bathing assist Assist Level: Independent with assistive device     Upper Body Dressing/Undressing Upper body dressing   What is the patient wearing?: Pull over shirt    Upper body assist Assist Level: Independent    Lower Body  Dressing/Undressing Lower body dressing      What is the patient wearing?: Underwear/pull up, Pants     Lower body assist Assist for lower body dressing: Independent with assitive device     Toileting Toileting    Toileting assist Assist for toileting: Independent with assistive device     Transfers Chair/bed transfer  Transfers assist     Chair/bed transfer assist level: Independent with assistive device     Locomotion Ambulation   Ambulation assist      Assist level: Independent with assistive device Assistive device: Cane-straight Max distance: 150 ft   Walk 10 feet activity   Assist     Assist level: Supervision/Verbal cueing Assistive device: Cane-straight   Walk 50 feet activity   Assist    Assist level: Supervision/Verbal cueing Assistive device: Cane-straight    Walk 150 feet activity   Assist    Assist level: Minimal Assistance - Patient > 75% Assistive device: Cane-straight    Walk 10 feet on uneven surface  activity   Assist Walk 10 feet on uneven surfaces activity did not occur: Safety/medical concerns   Assist level: Contact Guard/Touching assist Assistive device: Hospital doctor     Assist Will patient use wheelchair at discharge?: No Type of Wheelchair: Manual    Wheelchair assist level: Minimal Assistance - Patient > 75% Max wheelchair distance: 50    Wheelchair 50  feet with 2 turns activity    Assist        Assist Level: Minimal Assistance - Patient > 75%   Wheelchair 150 feet activity     Assist Wheelchair 150 feet activity did not occur: Safety/medical concerns(fatigue)          Medical Problem List and Plan: 1.  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  Continue CIR,PT, OT D/c to AL today 2.  DVT Prophylaxis/Anticoagulation: Pharmaceutical: Lovenox 3. Pain Management: Tylenol as needed 4. Mood: LCSW to follow for evaluation and support 5.  Neuropsych: This patient is capable of making decisions on his own behalf. 6. Skin/Wound Care: Routine pressure relief measures 7. Fluids/Electrolytes/Nutrition: Monitor I/os- good p.o. intake for the most part.   BMP within acceptable range on 1/23, repeat 1/20  8.  HTN: Monitor blood pressures twice daily.  Continue to hold Cozaar to allow permissive hypertension.  Avoid hypotension.    Vitals:   04/18/18 2136 04/19/18 0332  BP: (!) 156/92 (!) 140/91  Pulse: 87 76  Resp:  12  Temp: 97.6 F (36.4 C) 97.7 F (36.5 C)  SpO2: 97% 98%  controlled 1/31 9.  Glaucoma: Continue Xalatan right eye at bedtime. 10.  Dyslipidemia: Continue Lipitor. 11.  Sleep disturbance/anxiety: Uses low dose Xanax at bedtime  12. Loose stools: Discontinued Colace.   Improved with more consistent diet 13.  Hypoalbuminemia  Supplement initiated on 1/24  LOS: 9 days A FACE TO [place] E Kirsteins 04/19/2018, 8:42 AM

## 2018-04-19 NOTE — Discharge Instructions (Signed)
Inpatient Rehab Discharge Instructions  JERIEL VIVANCO Discharge date and time:  04/19/18  Activities/Precautions/ Functional Status: Activity: no lifting, driving, or strenuous exercise till cleared by MD Diet: cardiac diet Wound Care: none needed    Functional status:  ___ No restrictions     ___ Walk up steps independently ___ 24/7 supervision/assistance   ___ Walk up steps with assistance _X__ Intermittent supervision/assistance  ___ Bathe/dress independently ___ Walk with walker     ___ Bathe/dress with assistance ___ Walk Independently    ___ Shower independently ___ Walk with assistance    ___ Shower with assistance _X__ No alcohol     ___ Return to work/school ________   Special Instructions:   COMMUNITY REFERRALS UPON DISCHARGE:    Home Health:   PT & OT    Agency:[facility] [place] AJOIN:[phone]   Date of last service:04/19/2018  Medical Equipment/Items Ordered:CANE  Agency/Supplier:[facility]   [phone] Other:GOING TO [facility] THEN TRANSITION BACK TO INDEPENDENT APARTMENT  GENERAL COMMUNITY RESOURCES FOR PATIENT/FAMILY: Support Groups:[facility] SUPPORT GROUP THE SECOND Thursday @ 6:00-7:00 PM ON THE REHAB UNIT QUESTIONS CALL AMY [phone]  STROKE/TIA DISCHARGE INSTRUCTIONS SMOKING Cigarette smoking nearly doubles your risk of having a stroke & is the single most alterable risk factor  If you smoke or have smoked in the last 12 months, you are advised to quit smoking for your health.  Most of the excess cardiovascular risk related to smoking disappears within a year of stopping.  Ask you doctor about anti-smoking medications  [place] Quit Line: 1-800-QUIT NOW  Free Smoking Cessation Classes [phone]  CHOLESTEROL Know your levels; limit fat & cholesterol in your diet  Lipid Panel     Component Value Date/Time   CHOL 160 04/07/2018 0715   TRIG 122 04/07/2018 0715   HDL 47 04/07/2018 0715   CHOLHDL  3.4 04/07/2018 0715   VLDL 24 04/07/2018 0715   LDLCALC 89 04/07/2018 0715      Many patients benefit from treatment even if their cholesterol is at goal.  Goal: Total Cholesterol (CHOL) less than 160  Goal:  Triglycerides (TRIG) less than 150  Goal:  HDL greater than 40  Goal:  LDL (LDLCALC) less than 100   BLOOD PRESSURE American Stroke Association blood pressure target is less that 120/80 mm/Hg  Your discharge blood pressure is:  BP: (!) 140/91  Monitor your blood pressure  Limit your salt and alcohol intake  Many individuals will require more than one medication for high blood pressure  DIABETES (A1c is a blood sugar average for last 3 months) Goal HGBA1c is under 7% (HBGA1c is blood sugar average for last 3 months)  Diabetes: No known diagnosis of diabetes    Lab Results  Component Value Date   HGBA1C 5.5 04/06/2018     Your HGBA1c can be lowered with medications, healthy diet, and exercise.  Check your blood sugar as directed by your physician  Call your physician if you experience unexplained or low blood sugars.  PHYSICAL ACTIVITY/REHABILITATION Goal is 30 minutes at least 4 days per week  Activity: No driving, Therapies: see above Return to work: NA  Activity decreases your risk of heart attack and stroke and makes your heart stronger.  It helps control your weight and blood pressure; helps you relax and can improve your mood.  Participate in a regular exercise program.  Talk with your doctor about the best form of exercise for you (dancing, walking, swimming, cycling).  DIET/WEIGHT Goal is to maintain a healthy weight  Your discharge diet is:  Diet Order            Diet regular Room service appropriate? Yes; Fluid consistency: Thin  Diet effective now            liquids Your height is:  Height: 6\' 2"  (188 cm) Your current weight is:  Your Body Mass Index (BMI) is:  20.49  Following the type of diet specifically designed for you will help prevent  another stroke.  You are at goal weight  Your goal Body Mass Index (BMI) is 19-24.  Healthy food habits can help reduce 3 risk factors for stroke:  High cholesterol, hypertension, and excess weight.  RESOURCES Stroke/Support Group:  Call [phone]   STROKE EDUCATION PROVIDED/REVIEWED AND GIVEN TO PATIENT Stroke warning signs and symptoms How to activate emergency medical system (call 911). Medications prescribed at discharge. Need for follow-up after discharge. Personal risk factors for stroke. Pneumonia vaccine given:  Flu vaccine given:  My questions have been answered, the writing is legible, and I understand these instructions.  I will adhere to these goals & educational materials that have been provided to me after my discharge from the hospital.     My questions have been answered and I understand these instructions. I will adhere to these goals and the provided educational materials after my discharge from the hospital.  Patient/Caregiver Signature _______________________________ Date __________  Clinician Signature _______________________________________ Date __________  Please bring this form and your medication list with you to all your follow-up doctor's appointments.

## 2018-04-22 DIAGNOSIS — M79622 Pain in left upper arm: Secondary | ICD-10-CM | POA: Diagnosis not present

## 2018-04-22 DIAGNOSIS — M6281 Muscle weakness (generalized): Secondary | ICD-10-CM | POA: Diagnosis not present

## 2018-04-22 DIAGNOSIS — R2681 Unsteadiness on feet: Secondary | ICD-10-CM | POA: Diagnosis not present

## 2018-04-22 DIAGNOSIS — I69398 Other sequelae of cerebral infarction: Secondary | ICD-10-CM | POA: Diagnosis not present

## 2018-04-22 DIAGNOSIS — R278 Other lack of coordination: Secondary | ICD-10-CM | POA: Diagnosis not present

## 2018-04-23 ENCOUNTER — Telehealth: Payer: Self-pay

## 2018-04-23 DIAGNOSIS — I69398 Other sequelae of cerebral infarction: Secondary | ICD-10-CM | POA: Diagnosis not present

## 2018-04-23 DIAGNOSIS — M79622 Pain in left upper arm: Secondary | ICD-10-CM | POA: Diagnosis not present

## 2018-04-23 DIAGNOSIS — R2681 Unsteadiness on feet: Secondary | ICD-10-CM | POA: Diagnosis not present

## 2018-04-23 DIAGNOSIS — R278 Other lack of coordination: Secondary | ICD-10-CM | POA: Diagnosis not present

## 2018-04-23 DIAGNOSIS — M6281 Muscle weakness (generalized): Secondary | ICD-10-CM | POA: Diagnosis not present

## 2018-04-23 NOTE — Telephone Encounter (Signed)
First attempt at a Transitional Care call, no answer on any line, main mobile number has full mailbox  Patient name: [name] Duffy) DOB: (1930-02-03) Appointment date/time (05-02-2018 / 11:20am), arrive time (11:00am) and who it is with here (Dr. Posey Pronto) [place]

## 2018-04-24 DIAGNOSIS — R2689 Other abnormalities of gait and mobility: Secondary | ICD-10-CM | POA: Diagnosis not present

## 2018-04-24 DIAGNOSIS — M1990 Unspecified osteoarthritis, unspecified site: Secondary | ICD-10-CM | POA: Diagnosis not present

## 2018-04-24 DIAGNOSIS — E7849 Other hyperlipidemia: Secondary | ICD-10-CM | POA: Diagnosis not present

## 2018-04-24 DIAGNOSIS — M79622 Pain in left upper arm: Secondary | ICD-10-CM | POA: Diagnosis not present

## 2018-04-24 DIAGNOSIS — R2681 Unsteadiness on feet: Secondary | ICD-10-CM | POA: Diagnosis not present

## 2018-04-24 DIAGNOSIS — I69398 Other sequelae of cerebral infarction: Secondary | ICD-10-CM | POA: Diagnosis not present

## 2018-04-24 DIAGNOSIS — Z6822 Body mass index (BMI) 22.0-22.9, adult: Secondary | ICD-10-CM | POA: Diagnosis not present

## 2018-04-24 DIAGNOSIS — I10 Essential (primary) hypertension: Secondary | ICD-10-CM | POA: Diagnosis not present

## 2018-04-24 DIAGNOSIS — R278 Other lack of coordination: Secondary | ICD-10-CM | POA: Diagnosis not present

## 2018-04-24 DIAGNOSIS — M6281 Muscle weakness (generalized): Secondary | ICD-10-CM | POA: Diagnosis not present

## 2018-04-24 DIAGNOSIS — I6350 Cerebral infarction due to unspecified occlusion or stenosis of unspecified cerebral artery: Secondary | ICD-10-CM | POA: Diagnosis not present

## 2018-04-26 DIAGNOSIS — I69398 Other sequelae of cerebral infarction: Secondary | ICD-10-CM | POA: Diagnosis not present

## 2018-04-26 DIAGNOSIS — M79622 Pain in left upper arm: Secondary | ICD-10-CM | POA: Diagnosis not present

## 2018-04-26 DIAGNOSIS — R278 Other lack of coordination: Secondary | ICD-10-CM | POA: Diagnosis not present

## 2018-04-26 DIAGNOSIS — M6281 Muscle weakness (generalized): Secondary | ICD-10-CM | POA: Diagnosis not present

## 2018-04-26 DIAGNOSIS — R2681 Unsteadiness on feet: Secondary | ICD-10-CM | POA: Diagnosis not present

## 2018-04-29 DIAGNOSIS — M79622 Pain in left upper arm: Secondary | ICD-10-CM | POA: Diagnosis not present

## 2018-04-29 DIAGNOSIS — R2681 Unsteadiness on feet: Secondary | ICD-10-CM | POA: Diagnosis not present

## 2018-04-29 DIAGNOSIS — I69398 Other sequelae of cerebral infarction: Secondary | ICD-10-CM | POA: Diagnosis not present

## 2018-04-29 DIAGNOSIS — M6281 Muscle weakness (generalized): Secondary | ICD-10-CM | POA: Diagnosis not present

## 2018-04-29 DIAGNOSIS — R278 Other lack of coordination: Secondary | ICD-10-CM | POA: Diagnosis not present

## 2018-04-30 DIAGNOSIS — M79622 Pain in left upper arm: Secondary | ICD-10-CM | POA: Diagnosis not present

## 2018-04-30 DIAGNOSIS — R2681 Unsteadiness on feet: Secondary | ICD-10-CM | POA: Diagnosis not present

## 2018-04-30 DIAGNOSIS — R278 Other lack of coordination: Secondary | ICD-10-CM | POA: Diagnosis not present

## 2018-04-30 DIAGNOSIS — I69398 Other sequelae of cerebral infarction: Secondary | ICD-10-CM | POA: Diagnosis not present

## 2018-04-30 DIAGNOSIS — M6281 Muscle weakness (generalized): Secondary | ICD-10-CM | POA: Diagnosis not present

## 2018-05-01 DIAGNOSIS — M79622 Pain in left upper arm: Secondary | ICD-10-CM | POA: Diagnosis not present

## 2018-05-01 DIAGNOSIS — M6281 Muscle weakness (generalized): Secondary | ICD-10-CM | POA: Diagnosis not present

## 2018-05-01 DIAGNOSIS — I69398 Other sequelae of cerebral infarction: Secondary | ICD-10-CM | POA: Diagnosis not present

## 2018-05-01 DIAGNOSIS — R278 Other lack of coordination: Secondary | ICD-10-CM | POA: Diagnosis not present

## 2018-05-01 DIAGNOSIS — R2681 Unsteadiness on feet: Secondary | ICD-10-CM | POA: Diagnosis not present

## 2018-05-02 ENCOUNTER — Encounter: Payer: Medicare Other | Admitting: Physical Medicine & Rehabilitation

## 2018-05-02 DIAGNOSIS — R278 Other lack of coordination: Secondary | ICD-10-CM | POA: Diagnosis not present

## 2018-05-02 DIAGNOSIS — M6281 Muscle weakness (generalized): Secondary | ICD-10-CM | POA: Diagnosis not present

## 2018-05-02 DIAGNOSIS — I69398 Other sequelae of cerebral infarction: Secondary | ICD-10-CM | POA: Diagnosis not present

## 2018-05-02 DIAGNOSIS — R2681 Unsteadiness on feet: Secondary | ICD-10-CM | POA: Diagnosis not present

## 2018-05-02 DIAGNOSIS — M79622 Pain in left upper arm: Secondary | ICD-10-CM | POA: Diagnosis not present

## 2018-05-03 ENCOUNTER — Encounter: Payer: Medicare Other | Admitting: Physical Medicine & Rehabilitation

## 2018-05-03 DIAGNOSIS — M6281 Muscle weakness (generalized): Secondary | ICD-10-CM | POA: Diagnosis not present

## 2018-05-03 DIAGNOSIS — R2681 Unsteadiness on feet: Secondary | ICD-10-CM | POA: Diagnosis not present

## 2018-05-03 DIAGNOSIS — M79622 Pain in left upper arm: Secondary | ICD-10-CM | POA: Diagnosis not present

## 2018-05-03 DIAGNOSIS — I69398 Other sequelae of cerebral infarction: Secondary | ICD-10-CM | POA: Diagnosis not present

## 2018-05-03 DIAGNOSIS — R278 Other lack of coordination: Secondary | ICD-10-CM | POA: Diagnosis not present

## 2018-05-06 DIAGNOSIS — R2681 Unsteadiness on feet: Secondary | ICD-10-CM | POA: Diagnosis not present

## 2018-05-06 DIAGNOSIS — M6281 Muscle weakness (generalized): Secondary | ICD-10-CM | POA: Diagnosis not present

## 2018-05-06 DIAGNOSIS — R278 Other lack of coordination: Secondary | ICD-10-CM | POA: Diagnosis not present

## 2018-05-06 DIAGNOSIS — M79622 Pain in left upper arm: Secondary | ICD-10-CM | POA: Diagnosis not present

## 2018-05-06 DIAGNOSIS — I69398 Other sequelae of cerebral infarction: Secondary | ICD-10-CM | POA: Diagnosis not present

## 2018-05-08 DIAGNOSIS — M79622 Pain in left upper arm: Secondary | ICD-10-CM | POA: Diagnosis not present

## 2018-05-08 DIAGNOSIS — R278 Other lack of coordination: Secondary | ICD-10-CM | POA: Diagnosis not present

## 2018-05-08 DIAGNOSIS — R2681 Unsteadiness on feet: Secondary | ICD-10-CM | POA: Diagnosis not present

## 2018-05-08 DIAGNOSIS — M6281 Muscle weakness (generalized): Secondary | ICD-10-CM | POA: Diagnosis not present

## 2018-05-08 DIAGNOSIS — I69398 Other sequelae of cerebral infarction: Secondary | ICD-10-CM | POA: Diagnosis not present

## 2018-05-09 DIAGNOSIS — R278 Other lack of coordination: Secondary | ICD-10-CM | POA: Diagnosis not present

## 2018-05-09 DIAGNOSIS — M79622 Pain in left upper arm: Secondary | ICD-10-CM | POA: Diagnosis not present

## 2018-05-09 DIAGNOSIS — I69398 Other sequelae of cerebral infarction: Secondary | ICD-10-CM | POA: Diagnosis not present

## 2018-05-09 DIAGNOSIS — R2681 Unsteadiness on feet: Secondary | ICD-10-CM | POA: Diagnosis not present

## 2018-05-09 DIAGNOSIS — M6281 Muscle weakness (generalized): Secondary | ICD-10-CM | POA: Diagnosis not present

## 2018-05-10 ENCOUNTER — Encounter: Payer: Medicare Other | Admitting: Physical Medicine & Rehabilitation

## 2018-05-13 DIAGNOSIS — R278 Other lack of coordination: Secondary | ICD-10-CM | POA: Diagnosis not present

## 2018-05-13 DIAGNOSIS — R2681 Unsteadiness on feet: Secondary | ICD-10-CM | POA: Diagnosis not present

## 2018-05-13 DIAGNOSIS — M6281 Muscle weakness (generalized): Secondary | ICD-10-CM | POA: Diagnosis not present

## 2018-05-13 DIAGNOSIS — I69398 Other sequelae of cerebral infarction: Secondary | ICD-10-CM | POA: Diagnosis not present

## 2018-05-13 DIAGNOSIS — M79622 Pain in left upper arm: Secondary | ICD-10-CM | POA: Diagnosis not present

## 2018-05-14 DIAGNOSIS — M79672 Pain in left foot: Secondary | ICD-10-CM | POA: Diagnosis not present

## 2018-05-14 DIAGNOSIS — I69398 Other sequelae of cerebral infarction: Secondary | ICD-10-CM | POA: Diagnosis not present

## 2018-05-14 DIAGNOSIS — M79671 Pain in right foot: Secondary | ICD-10-CM | POA: Diagnosis not present

## 2018-05-14 DIAGNOSIS — M79622 Pain in left upper arm: Secondary | ICD-10-CM | POA: Diagnosis not present

## 2018-05-14 DIAGNOSIS — R2681 Unsteadiness on feet: Secondary | ICD-10-CM | POA: Diagnosis not present

## 2018-05-14 DIAGNOSIS — B351 Tinea unguium: Secondary | ICD-10-CM | POA: Diagnosis not present

## 2018-05-14 DIAGNOSIS — R278 Other lack of coordination: Secondary | ICD-10-CM | POA: Diagnosis not present

## 2018-05-14 DIAGNOSIS — M6281 Muscle weakness (generalized): Secondary | ICD-10-CM | POA: Diagnosis not present

## 2018-05-16 DIAGNOSIS — R2681 Unsteadiness on feet: Secondary | ICD-10-CM | POA: Diagnosis not present

## 2018-05-16 DIAGNOSIS — M6281 Muscle weakness (generalized): Secondary | ICD-10-CM | POA: Diagnosis not present

## 2018-05-16 DIAGNOSIS — M79622 Pain in left upper arm: Secondary | ICD-10-CM | POA: Diagnosis not present

## 2018-05-16 DIAGNOSIS — R278 Other lack of coordination: Secondary | ICD-10-CM | POA: Diagnosis not present

## 2018-05-16 DIAGNOSIS — I69398 Other sequelae of cerebral infarction: Secondary | ICD-10-CM | POA: Diagnosis not present

## 2018-05-17 DIAGNOSIS — I69398 Other sequelae of cerebral infarction: Secondary | ICD-10-CM | POA: Diagnosis not present

## 2018-05-17 DIAGNOSIS — R278 Other lack of coordination: Secondary | ICD-10-CM | POA: Diagnosis not present

## 2018-05-17 DIAGNOSIS — R2681 Unsteadiness on feet: Secondary | ICD-10-CM | POA: Diagnosis not present

## 2018-05-17 DIAGNOSIS — M6281 Muscle weakness (generalized): Secondary | ICD-10-CM | POA: Diagnosis not present

## 2018-05-17 DIAGNOSIS — M79622 Pain in left upper arm: Secondary | ICD-10-CM | POA: Diagnosis not present

## 2018-05-20 DIAGNOSIS — R2681 Unsteadiness on feet: Secondary | ICD-10-CM | POA: Diagnosis not present

## 2018-05-20 DIAGNOSIS — M6281 Muscle weakness (generalized): Secondary | ICD-10-CM | POA: Diagnosis not present

## 2018-05-20 DIAGNOSIS — R278 Other lack of coordination: Secondary | ICD-10-CM | POA: Diagnosis not present

## 2018-05-20 DIAGNOSIS — M79622 Pain in left upper arm: Secondary | ICD-10-CM | POA: Diagnosis not present

## 2018-05-20 DIAGNOSIS — I69398 Other sequelae of cerebral infarction: Secondary | ICD-10-CM | POA: Diagnosis not present

## 2018-05-21 DIAGNOSIS — R278 Other lack of coordination: Secondary | ICD-10-CM | POA: Diagnosis not present

## 2018-05-21 DIAGNOSIS — I69398 Other sequelae of cerebral infarction: Secondary | ICD-10-CM | POA: Diagnosis not present

## 2018-05-21 DIAGNOSIS — M6281 Muscle weakness (generalized): Secondary | ICD-10-CM | POA: Diagnosis not present

## 2018-05-21 DIAGNOSIS — M79622 Pain in left upper arm: Secondary | ICD-10-CM | POA: Diagnosis not present

## 2018-05-21 DIAGNOSIS — R2681 Unsteadiness on feet: Secondary | ICD-10-CM | POA: Diagnosis not present

## 2018-05-23 DIAGNOSIS — M6281 Muscle weakness (generalized): Secondary | ICD-10-CM | POA: Diagnosis not present

## 2018-05-23 DIAGNOSIS — M79622 Pain in left upper arm: Secondary | ICD-10-CM | POA: Diagnosis not present

## 2018-05-23 DIAGNOSIS — R2681 Unsteadiness on feet: Secondary | ICD-10-CM | POA: Diagnosis not present

## 2018-05-23 DIAGNOSIS — I69398 Other sequelae of cerebral infarction: Secondary | ICD-10-CM | POA: Diagnosis not present

## 2018-05-23 DIAGNOSIS — R278 Other lack of coordination: Secondary | ICD-10-CM | POA: Diagnosis not present

## 2018-05-26 DIAGNOSIS — I69398 Other sequelae of cerebral infarction: Secondary | ICD-10-CM | POA: Diagnosis not present

## 2018-05-26 DIAGNOSIS — M79622 Pain in left upper arm: Secondary | ICD-10-CM | POA: Diagnosis not present

## 2018-05-26 DIAGNOSIS — R278 Other lack of coordination: Secondary | ICD-10-CM | POA: Diagnosis not present

## 2018-05-26 DIAGNOSIS — M6281 Muscle weakness (generalized): Secondary | ICD-10-CM | POA: Diagnosis not present

## 2018-05-26 DIAGNOSIS — R2681 Unsteadiness on feet: Secondary | ICD-10-CM | POA: Diagnosis not present

## 2018-05-28 DIAGNOSIS — I69398 Other sequelae of cerebral infarction: Secondary | ICD-10-CM | POA: Diagnosis not present

## 2018-05-28 DIAGNOSIS — R278 Other lack of coordination: Secondary | ICD-10-CM | POA: Diagnosis not present

## 2018-05-28 DIAGNOSIS — R2681 Unsteadiness on feet: Secondary | ICD-10-CM | POA: Diagnosis not present

## 2018-05-28 DIAGNOSIS — M79622 Pain in left upper arm: Secondary | ICD-10-CM | POA: Diagnosis not present

## 2018-05-28 DIAGNOSIS — M6281 Muscle weakness (generalized): Secondary | ICD-10-CM | POA: Diagnosis not present

## 2018-05-29 DIAGNOSIS — M6281 Muscle weakness (generalized): Secondary | ICD-10-CM | POA: Diagnosis not present

## 2018-05-29 DIAGNOSIS — M79622 Pain in left upper arm: Secondary | ICD-10-CM | POA: Diagnosis not present

## 2018-05-29 DIAGNOSIS — R278 Other lack of coordination: Secondary | ICD-10-CM | POA: Diagnosis not present

## 2018-05-29 DIAGNOSIS — I69398 Other sequelae of cerebral infarction: Secondary | ICD-10-CM | POA: Diagnosis not present

## 2018-05-29 DIAGNOSIS — R2681 Unsteadiness on feet: Secondary | ICD-10-CM | POA: Diagnosis not present

## 2018-05-30 ENCOUNTER — Encounter: Payer: Medicare Other | Attending: Physical Medicine & Rehabilitation | Admitting: Physical Medicine & Rehabilitation

## 2018-05-30 ENCOUNTER — Other Ambulatory Visit: Payer: Self-pay

## 2018-05-30 ENCOUNTER — Encounter: Payer: Self-pay | Admitting: Physical Medicine & Rehabilitation

## 2018-05-30 VITALS — BP 136/79 | HR 97 | Ht 74.0 in | Wt 173.0 lb

## 2018-05-30 DIAGNOSIS — I6381 Other cerebral infarction due to occlusion or stenosis of small artery: Secondary | ICD-10-CM

## 2018-05-30 DIAGNOSIS — R195 Other fecal abnormalities: Secondary | ICD-10-CM | POA: Diagnosis not present

## 2018-05-30 DIAGNOSIS — R269 Unspecified abnormalities of gait and mobility: Secondary | ICD-10-CM | POA: Diagnosis not present

## 2018-05-30 DIAGNOSIS — G479 Sleep disorder, unspecified: Secondary | ICD-10-CM | POA: Diagnosis not present

## 2018-05-30 DIAGNOSIS — I10 Essential (primary) hypertension: Secondary | ICD-10-CM | POA: Diagnosis not present

## 2018-05-30 DIAGNOSIS — R2681 Unsteadiness on feet: Secondary | ICD-10-CM | POA: Diagnosis not present

## 2018-05-30 DIAGNOSIS — I69398 Other sequelae of cerebral infarction: Secondary | ICD-10-CM | POA: Diagnosis not present

## 2018-05-30 DIAGNOSIS — F411 Generalized anxiety disorder: Secondary | ICD-10-CM | POA: Diagnosis not present

## 2018-05-30 DIAGNOSIS — M79622 Pain in left upper arm: Secondary | ICD-10-CM | POA: Diagnosis not present

## 2018-05-30 DIAGNOSIS — R278 Other lack of coordination: Secondary | ICD-10-CM | POA: Diagnosis not present

## 2018-05-30 DIAGNOSIS — M6281 Muscle weakness (generalized): Secondary | ICD-10-CM | POA: Diagnosis not present

## 2018-05-30 NOTE — Progress Notes (Signed)
Subjective:    Patient ID: [name] Duffy, male    DOB: Jun 16, 1929, 85 y.o.   MRN: [id number]  HPI 85 year old Rh-male with history of HTN, prostate cancer, recent falls presents for hospital follow-up after receiving CIR for left centrum ovale infarct.   Admit date: 04/10/2018 Discharge date: 04/19/2018  At discharge, he was instructed to follow up with Neurology, but does not have an appointment.  He is currently in ALF.  He saw his PCP. BP is controlled. Sleep has improved. Bowel movement are almost normal.  Denies falls.  Mobility improvement, continues to have some balance deficits.  Therapies: 4/week DME: Did not require Mobility: Rollator at all times, scooter for long distance.  Pain Inventory Average Pain 3 Pain Right Now 3 My pain is constant and dull  In the last 24 hours, has pain interfered with the following? General activity 0 Relation with others 0 Enjoyment of life 2 What TIME of day is your pain at its worst? night Sleep (in general) Good  Pain is worse with: some activites Pain improves with: rest, therapy/exercise and medication Relief from Meds: 8  Mobility use a walker how many minutes can you walk? 10 ability to climb steps?  no  Function retired I need assistance with the following:  meal prep, household duties and shopping  Neuro/Psych bladder control problems weakness trouble walking dizziness depression  Prior Studies Any changes since last visit?  no  Physicians involved in your care Primary care Dr. Reynaldo Minium   Family History  Problem Relation Age of Onset  . Hypertension Other   . Colon cancer Neg Hx   . Colon polyps Neg Hx   . Esophageal cancer Neg Hx   . Rectal cancer Neg Hx   . Stomach cancer Neg Hx    Social History   Socioeconomic History  . Marital status: Married    Spouse name: Not on file  . Number of children: Not on file  . Years of education: Not on file  . Highest education level: Not on file   Occupational History  . Not on file  Social Needs  . Financial resource strain: Not on file  . Food insecurity:    Worry: Not on file    Inability: Not on file  . Transportation needs:    Medical: Not on file    Non-medical: Not on file  Tobacco Use  . Smoking status: Never Smoker  . Smokeless tobacco: Never Used  Substance and Sexual Activity  . Alcohol use: No  . Drug use: No  . Sexual activity: Not on file  Lifestyle  . Physical activity:    Days per week: Not on file    Minutes per session: Not on file  . Stress: Not on file  Relationships  . Social connections:    Talks on phone: Not on file    Gets together: Not on file    Attends religious service: Not on file    Active member of club or organization: Not on file    Attends meetings of clubs or organizations: Not on file    Relationship status: Not on file  Other Topics Concern  . Not on file  Social History Narrative  . Not on file   Past Surgical History:  Procedure Laterality Date  . COLONOSCOPY    . HERNIA REPAIR    . PROSTATECTOMY     Past Medical History:  Diagnosis Date  . Anxiety   . Arthritis  hands  . Blood transfusion without reported diagnosis    pt thinks had blood trnsfusion at prostate ca surgery   . Glaucoma   . High cholesterol   . Hypertension   . Prostate cancer (HCC)    BP 136/79   Pulse 97   Ht 6\' 2"  (1.88 m)   Wt 150 lb (68 kg)   SpO2 94%   BMI 19.26 kg/m   Opioid Risk Score:   Fall Risk Score:  `1  Depression screen PHQ 2/9  Depression screen PHQ 2/9 05/30/2018  Decreased Interest 0  Down, Depressed, Hopeless 0  PHQ - 2 Score 0    Review of Systems  Constitutional: Negative.   HENT: Negative.   Eyes: Negative.   Respiratory: Negative.   Cardiovascular: Negative.   Gastrointestinal: Negative.   Endocrine: Negative.   Genitourinary: Negative.   Musculoskeletal: Positive for gait problem.  Skin: Negative.   Allergic/Immunologic: Negative.   Neurological:  Negative for dizziness, weakness, numbness and headaches.  Hematological: Negative.   Psychiatric/Behavioral: Negative.   All other systems reviewed and are negative.     Objective:   Physical Exam Constitutional: No distress . Vital signs reviewed. HENT: Normocephalic.  Atraumatic. Eyes: EOMI. No discharge. Cardiovascular: RRR. No JVD. Respiratory: CTA bilaterally. Normal effort. GI: BS +. Non-distended. Musc: No edema or tenderness in extremities. Gait: Kyphotic posture with short stride length Neurological: He is alert and oriented  HOH Motor: Bilateral upper extremities 5/5 proximal to distal Left lower extremity: 5/5 proximal distal Right lower extremity: 5/5 proximal to distal Psych: Normal mood.  Normal affect.    Assessment & Plan:  85 year old Rh-male with history of HTN, prostate cancer, recent falls presents for hospital follow-up after receiving CIR for left centrum ovale infarct.  1. 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  Improving  Follow up with Neuro- needs appointment  Cont therapies  Will discuss return to driving on next visit  2.  HTN:   Cont meds  Controlled at present  3.  Sleep disturbance/anxiety:   Using xanax, consider weaning  4. Loose stools:   Improving  5. Gait abnormality  Cont therapies  Cont rollator for safety  Meds reviewed Referrals reviewed- needs Neuro appointment All questions answered

## 2018-05-30 NOTE — Patient Instructions (Addendum)
Please follow up with Neurology: Address: [address] [place], [place] [address] Phone: [phone]

## 2018-06-04 DIAGNOSIS — R278 Other lack of coordination: Secondary | ICD-10-CM | POA: Diagnosis not present

## 2018-06-04 DIAGNOSIS — M79622 Pain in left upper arm: Secondary | ICD-10-CM | POA: Diagnosis not present

## 2018-06-04 DIAGNOSIS — I69398 Other sequelae of cerebral infarction: Secondary | ICD-10-CM | POA: Diagnosis not present

## 2018-06-04 DIAGNOSIS — R2681 Unsteadiness on feet: Secondary | ICD-10-CM | POA: Diagnosis not present

## 2018-06-04 DIAGNOSIS — M6281 Muscle weakness (generalized): Secondary | ICD-10-CM | POA: Diagnosis not present

## 2018-06-05 DIAGNOSIS — M6281 Muscle weakness (generalized): Secondary | ICD-10-CM | POA: Diagnosis not present

## 2018-06-05 DIAGNOSIS — M79622 Pain in left upper arm: Secondary | ICD-10-CM | POA: Diagnosis not present

## 2018-06-05 DIAGNOSIS — I69398 Other sequelae of cerebral infarction: Secondary | ICD-10-CM | POA: Diagnosis not present

## 2018-06-05 DIAGNOSIS — R2681 Unsteadiness on feet: Secondary | ICD-10-CM | POA: Diagnosis not present

## 2018-06-05 DIAGNOSIS — R278 Other lack of coordination: Secondary | ICD-10-CM | POA: Diagnosis not present

## 2018-06-06 DIAGNOSIS — R278 Other lack of coordination: Secondary | ICD-10-CM | POA: Diagnosis not present

## 2018-06-06 DIAGNOSIS — I69398 Other sequelae of cerebral infarction: Secondary | ICD-10-CM | POA: Diagnosis not present

## 2018-06-06 DIAGNOSIS — M6281 Muscle weakness (generalized): Secondary | ICD-10-CM | POA: Diagnosis not present

## 2018-06-06 DIAGNOSIS — R2681 Unsteadiness on feet: Secondary | ICD-10-CM | POA: Diagnosis not present

## 2018-06-06 DIAGNOSIS — M79622 Pain in left upper arm: Secondary | ICD-10-CM | POA: Diagnosis not present

## 2018-06-07 DIAGNOSIS — R2681 Unsteadiness on feet: Secondary | ICD-10-CM | POA: Diagnosis not present

## 2018-06-07 DIAGNOSIS — I69398 Other sequelae of cerebral infarction: Secondary | ICD-10-CM | POA: Diagnosis not present

## 2018-06-07 DIAGNOSIS — M79622 Pain in left upper arm: Secondary | ICD-10-CM | POA: Diagnosis not present

## 2018-06-07 DIAGNOSIS — R278 Other lack of coordination: Secondary | ICD-10-CM | POA: Diagnosis not present

## 2018-06-07 DIAGNOSIS — M6281 Muscle weakness (generalized): Secondary | ICD-10-CM | POA: Diagnosis not present

## 2018-06-10 DIAGNOSIS — M6281 Muscle weakness (generalized): Secondary | ICD-10-CM | POA: Diagnosis not present

## 2018-06-10 DIAGNOSIS — M79622 Pain in left upper arm: Secondary | ICD-10-CM | POA: Diagnosis not present

## 2018-06-10 DIAGNOSIS — R278 Other lack of coordination: Secondary | ICD-10-CM | POA: Diagnosis not present

## 2018-06-10 DIAGNOSIS — I69398 Other sequelae of cerebral infarction: Secondary | ICD-10-CM | POA: Diagnosis not present

## 2018-06-10 DIAGNOSIS — R2681 Unsteadiness on feet: Secondary | ICD-10-CM | POA: Diagnosis not present

## 2018-06-11 DIAGNOSIS — M6281 Muscle weakness (generalized): Secondary | ICD-10-CM | POA: Diagnosis not present

## 2018-06-11 DIAGNOSIS — R278 Other lack of coordination: Secondary | ICD-10-CM | POA: Diagnosis not present

## 2018-06-11 DIAGNOSIS — I69398 Other sequelae of cerebral infarction: Secondary | ICD-10-CM | POA: Diagnosis not present

## 2018-06-11 DIAGNOSIS — M79622 Pain in left upper arm: Secondary | ICD-10-CM | POA: Diagnosis not present

## 2018-06-11 DIAGNOSIS — R2681 Unsteadiness on feet: Secondary | ICD-10-CM | POA: Diagnosis not present

## 2018-06-13 DIAGNOSIS — I69398 Other sequelae of cerebral infarction: Secondary | ICD-10-CM | POA: Diagnosis not present

## 2018-06-13 DIAGNOSIS — M79622 Pain in left upper arm: Secondary | ICD-10-CM | POA: Diagnosis not present

## 2018-06-13 DIAGNOSIS — R2681 Unsteadiness on feet: Secondary | ICD-10-CM | POA: Diagnosis not present

## 2018-06-13 DIAGNOSIS — R278 Other lack of coordination: Secondary | ICD-10-CM | POA: Diagnosis not present

## 2018-06-13 DIAGNOSIS — M6281 Muscle weakness (generalized): Secondary | ICD-10-CM | POA: Diagnosis not present

## 2018-06-14 DIAGNOSIS — M6281 Muscle weakness (generalized): Secondary | ICD-10-CM | POA: Diagnosis not present

## 2018-06-14 DIAGNOSIS — R2681 Unsteadiness on feet: Secondary | ICD-10-CM | POA: Diagnosis not present

## 2018-06-14 DIAGNOSIS — M79622 Pain in left upper arm: Secondary | ICD-10-CM | POA: Diagnosis not present

## 2018-06-14 DIAGNOSIS — R278 Other lack of coordination: Secondary | ICD-10-CM | POA: Diagnosis not present

## 2018-06-14 DIAGNOSIS — I69398 Other sequelae of cerebral infarction: Secondary | ICD-10-CM | POA: Diagnosis not present

## 2018-06-17 DIAGNOSIS — M79622 Pain in left upper arm: Secondary | ICD-10-CM | POA: Diagnosis not present

## 2018-06-17 DIAGNOSIS — I69398 Other sequelae of cerebral infarction: Secondary | ICD-10-CM | POA: Diagnosis not present

## 2018-06-17 DIAGNOSIS — R278 Other lack of coordination: Secondary | ICD-10-CM | POA: Diagnosis not present

## 2018-06-17 DIAGNOSIS — M6281 Muscle weakness (generalized): Secondary | ICD-10-CM | POA: Diagnosis not present

## 2018-06-17 DIAGNOSIS — R2681 Unsteadiness on feet: Secondary | ICD-10-CM | POA: Diagnosis not present

## 2018-06-18 DIAGNOSIS — R278 Other lack of coordination: Secondary | ICD-10-CM | POA: Diagnosis not present

## 2018-06-18 DIAGNOSIS — R2681 Unsteadiness on feet: Secondary | ICD-10-CM | POA: Diagnosis not present

## 2018-06-18 DIAGNOSIS — I69398 Other sequelae of cerebral infarction: Secondary | ICD-10-CM | POA: Diagnosis not present

## 2018-06-18 DIAGNOSIS — M6281 Muscle weakness (generalized): Secondary | ICD-10-CM | POA: Diagnosis not present

## 2018-06-18 DIAGNOSIS — M79622 Pain in left upper arm: Secondary | ICD-10-CM | POA: Diagnosis not present

## 2018-06-20 DIAGNOSIS — I69398 Other sequelae of cerebral infarction: Secondary | ICD-10-CM | POA: Diagnosis not present

## 2018-06-20 DIAGNOSIS — R278 Other lack of coordination: Secondary | ICD-10-CM | POA: Diagnosis not present

## 2018-06-20 DIAGNOSIS — R2681 Unsteadiness on feet: Secondary | ICD-10-CM | POA: Diagnosis not present

## 2018-06-20 DIAGNOSIS — M6281 Muscle weakness (generalized): Secondary | ICD-10-CM | POA: Diagnosis not present

## 2018-06-24 DIAGNOSIS — I69398 Other sequelae of cerebral infarction: Secondary | ICD-10-CM | POA: Diagnosis not present

## 2018-06-24 DIAGNOSIS — M6281 Muscle weakness (generalized): Secondary | ICD-10-CM | POA: Diagnosis not present

## 2018-06-24 DIAGNOSIS — R2681 Unsteadiness on feet: Secondary | ICD-10-CM | POA: Diagnosis not present

## 2018-06-24 DIAGNOSIS — R278 Other lack of coordination: Secondary | ICD-10-CM | POA: Diagnosis not present

## 2018-07-11 ENCOUNTER — Ambulatory Visit: Payer: Medicare Other | Admitting: Physical Medicine & Rehabilitation

## 2018-07-12 DIAGNOSIS — Z20828 Contact with and (suspected) exposure to other viral communicable diseases: Secondary | ICD-10-CM | POA: Diagnosis not present

## 2018-07-14 DIAGNOSIS — Z20828 Contact with and (suspected) exposure to other viral communicable diseases: Secondary | ICD-10-CM | POA: Diagnosis not present

## 2018-07-15 DIAGNOSIS — M6389 Disorders of muscle in diseases classified elsewhere, multiple sites: Secondary | ICD-10-CM | POA: Diagnosis not present

## 2018-07-15 DIAGNOSIS — Z9181 History of falling: Secondary | ICD-10-CM | POA: Diagnosis not present

## 2018-07-15 DIAGNOSIS — I6381 Other cerebral infarction due to occlusion or stenosis of small artery: Secondary | ICD-10-CM | POA: Diagnosis not present

## 2018-07-15 DIAGNOSIS — R278 Other lack of coordination: Secondary | ICD-10-CM | POA: Diagnosis not present

## 2018-07-15 DIAGNOSIS — M170 Bilateral primary osteoarthritis of knee: Secondary | ICD-10-CM | POA: Diagnosis not present

## 2018-07-15 DIAGNOSIS — R2689 Other abnormalities of gait and mobility: Secondary | ICD-10-CM | POA: Diagnosis not present

## 2018-07-15 DIAGNOSIS — I6389 Other cerebral infarction: Secondary | ICD-10-CM | POA: Diagnosis not present

## 2018-07-16 ENCOUNTER — Encounter: Payer: Self-pay | Admitting: Internal Medicine

## 2018-07-16 ENCOUNTER — Non-Acute Institutional Stay: Payer: Medicare Other | Admitting: Internal Medicine

## 2018-07-16 DIAGNOSIS — I10 Essential (primary) hypertension: Secondary | ICD-10-CM

## 2018-07-16 DIAGNOSIS — M6389 Disorders of muscle in diseases classified elsewhere, multiple sites: Secondary | ICD-10-CM | POA: Diagnosis not present

## 2018-07-16 DIAGNOSIS — N393 Stress incontinence (female) (male): Secondary | ICD-10-CM

## 2018-07-16 DIAGNOSIS — E46 Unspecified protein-calorie malnutrition: Secondary | ICD-10-CM

## 2018-07-16 DIAGNOSIS — G479 Sleep disorder, unspecified: Secondary | ICD-10-CM | POA: Diagnosis not present

## 2018-07-16 DIAGNOSIS — R4189 Other symptoms and signs involving cognitive functions and awareness: Secondary | ICD-10-CM | POA: Diagnosis not present

## 2018-07-16 DIAGNOSIS — H409 Unspecified glaucoma: Secondary | ICD-10-CM | POA: Diagnosis not present

## 2018-07-16 DIAGNOSIS — I6381 Other cerebral infarction due to occlusion or stenosis of small artery: Secondary | ICD-10-CM | POA: Diagnosis not present

## 2018-07-16 DIAGNOSIS — R278 Other lack of coordination: Secondary | ICD-10-CM | POA: Diagnosis not present

## 2018-07-16 DIAGNOSIS — R2689 Other abnormalities of gait and mobility: Secondary | ICD-10-CM | POA: Diagnosis not present

## 2018-07-16 DIAGNOSIS — E8809 Other disorders of plasma-protein metabolism, not elsewhere classified: Secondary | ICD-10-CM

## 2018-07-16 DIAGNOSIS — E785 Hyperlipidemia, unspecified: Secondary | ICD-10-CM

## 2018-07-16 DIAGNOSIS — M170 Bilateral primary osteoarthritis of knee: Secondary | ICD-10-CM | POA: Diagnosis not present

## 2018-07-16 DIAGNOSIS — R29898 Other symptoms and signs involving the musculoskeletal system: Secondary | ICD-10-CM

## 2018-07-16 DIAGNOSIS — I6389 Other cerebral infarction: Secondary | ICD-10-CM | POA: Diagnosis not present

## 2018-07-16 NOTE — Progress Notes (Signed)
Patient ID: [name] Duffy, male   DOB: 31-Jul-1929, 85 y.o.   MRN: [id number]  Provider:  Rexene Edison. Mariea Clonts, D.O., C.M.D. Location:  [place] Room Number: 355 Place of Service:  ALF (13)  PCP: Gayland Curry, DO Patient Care Team: Gayland Curry, DO as PCP - General (Geriatric Medicine)  Extended Emergency Contact Information Primary Emergency Contact: Palazzo,[name] Address: [address]          [place] [address] [name] [place] Phone: [phone] Relation: Spouse Secondary Emergency Contact: [name] Mobile Phone: [phone] Relation: Nephew  Code Status: FULL CODE Goals of Care: Advanced Directive information Advanced Directives 07/16/2018  Does Patient Have a Medical Advance Directive? Yes  Type of Advance Directive [place]  Does patient want to make changes to medical advance directive? No - Patient declined  Copy of [place] in Chart? Yes - validated most recent copy scanned in chart (See row information)  Would patient like information on creating a medical advance directive? -   Chief Complaint  Patient presents with  . New Admit To SNF    Assisted Living admission    HPI: Patient is a 85 y.o. male with h/o prostate cancer, benign essential htn, hyperlipidemia, arthritis and anxiety seen today for [facility] from [place].  He and his wife had been living at [facility] in [place] where he was her primary caregiver until he sustained a left sided lacunar infarct 04/06/2018.  PCP previously has been Dr. Reynaldo Minium.  He sustained a fall at home and was noted to have right leg weakness.  His MRI of the brain showed a left centrum semiovale stroke.  He underwent the usual neuroimaging as outlined in the d/c summary from 04/09/18.  His statin was changed due to his amlodipine.  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  Cardiac MRI was negative for this mass.  He had PT, OT evals in the hospital and [facility] was recommended.  He was there from 04/10/18 to 04/19/18 when he returned to [facility].  While at [facility], he was also noted to have glaucoma, sleep disturbance and protein-calorie malnutrition.  He was managed with aspirin and plavix for three weeks (thru 2/8) and now will be on plavix alone due to small vessel ischemic disease on imaging.  He was taken off cozaar and did well.  He had some loose stools with too many laxatives (colace stopped).  He was sent to [facility] as modified independent with continued home health PT, OT. He was using a straight cane and able to ambulate 150 feet at that point.  His xanax dose was also decreased.  PMR recommended he continue with his rollator for safety and follow-up with neurology outpatient.  Also suggested weaning his xanax.    He is adjusting well.  He does report more difficulty finding words and that "when you get this age, you can't remember anything."  He did score 24/30 on his MMSE upon arrival here 4/26.  He seems to be a good historian about his past, and was able to relay what transpired leading up to today.  He admits to struggling with walking and getting his legs do "do right".    He has a HCPOA on file that will need to be uploaded to vynca.    His  vision is declining.  He needs a f/u with Dr. Herbert Deaner on this when residents may leave [facility] post-covid.    He had two screening swabs for covid 48 hrs apart upon arrival and both were negative.  Past Medical History:  Diagnosis Date  . Anxiety   . Arthritis    hands  . Blood transfusion without reported diagnosis    pt thinks had blood trnsfusion at prostate ca surgery   . Glaucoma   . High cholesterol   . Hypertension   . Prostate cancer [facility])    Past Surgical History:  Procedure Laterality Date  . COLONOSCOPY    .  HERNIA REPAIR    . PROSTATECTOMY      reports that he has never smoked. He has never used smokeless tobacco. He reports that he does not drink alcohol or use drugs. Social History   Socioeconomic History  . Marital status: Married    Spouse name: Not on file  . Number of children: Not on file  . Years of education: Not on file  . Highest education level: Not on file  Occupational History  . Not on file  Social Needs  . Financial resource strain: Not on file  . Food insecurity:    Worry: Not on file    Inability: Not on file  . Transportation needs:    Medical: Not on file    Non-medical: Not on file  Tobacco Use  . Smoking status: Never Smoker  . Smokeless tobacco: Never Used  Substance and Sexual Activity  . Alcohol use: No  . Drug use: No  . Sexual activity: Not on file  Lifestyle  . Physical activity:    Days per week: Not on file    Minutes per session: Not on file  . Stress: Not on file  Relationships  . Social connections:    Talks on phone: Not on file    Gets together: Not on file    Attends religious service: Not on file    Active member of club or organization: Not on file    Attends meetings of clubs or organizations: Not on file    Relationship status: Not on file  . Intimate partner violence:    Fear of current or ex partner: Not on file    Emotionally abused: Not on file    Physically abused: Not on file    Forced sexual activity: Not on file  Other Topics Concern  . Not on file  Social History Narrative  . Not on file    Functional Status Survey:    Family History  Problem Relation Age of Onset  . Hypertension Other   . Colon cancer Neg Hx   . Colon polyps Neg Hx   . Esophageal cancer Neg Hx   . Rectal cancer Neg Hx   . Stomach cancer Neg Hx     Health Maintenance  Topic Date Due  . TETANUS/TDAP  01/11/1949  . PNA vac Low Risk Adult (1 of 2 - PCV13) 01/10/1997  . INFLUENZA VACCINE  10/19/2018    Allergies  Allergen Reactions  .  Rofecoxib Swelling    REACTION: swelling- Vioxx name brand     Outpatient Encounter Medications as of 07/16/2018  Medication Sig  . acetaminophen (TYLENOL) 325 MG tablet Take 1-2 tablets (325-650 mg total) by mouth every 4 (four) hours as needed for mild pain.  Marland Kitchen ALPRAZolam (XANAX) 0.25 MG tablet Take 1 tablet (0.25 mg total) by mouth 2 (two)  times daily as needed for anxiety or sleep.  Marland Kitchen atorvastatin (LIPITOR) 20 MG tablet Take 1 tablet (20 mg total) by mouth daily at 6 PM.  . citalopram (CELEXA) 20 MG tablet Take 1 tablet (20 mg total) by mouth daily.  . clopidogrel (PLAVIX) 75 MG tablet Take 1 tablet (75 mg total) by mouth daily.  Marland Kitchen latanoprost (XALATAN) 0.005 % ophthalmic solution Place 1 drop into the right eye at bedtime.   . lidocaine (LIDODERM) 5 % Place 1 patch onto the skin daily. Remove & Discard patch within 12 hours or as directed by MD  . polyethylene glycol (MIRALAX / GLYCOLAX) packet Take 17 g by mouth daily as needed for mild constipation.  . Probiotic Product (ALIGN PO) Take 1 capsule by mouth daily.  . [DISCONTINUED] aspirin EC 81 MG tablet Take 81 mg by mouth at bedtime.   No facility-administered encounter medications on file as of 07/16/2018.     Review of Systems  Constitutional: Negative for activity change, appetite change, chills, fatigue, fever and unexpected weight change.  HENT: Positive for hearing loss. Negative for congestion and trouble swallowing.   Eyes: Negative for visual disturbance.       Vision declining  Respiratory: Negative for cough and shortness of breath.   Cardiovascular: Negative for chest pain, palpitations and leg swelling.  Gastrointestinal: Negative for abdominal pain, constipation, diarrhea, nausea and vomiting.  Genitourinary: Negative for dysuria.  Musculoskeletal: Positive for arthralgias and gait problem. Negative for back pain.  Skin: Negative for color change.  Allergic/Immunologic: Negative for environmental allergies.    Neurological: Negative for dizziness, weakness, light-headedness and headaches.       Had right lower extremity weakness--strength appears wnl now, but he's struggling to ambulate and do exercises (seems less coordinated)  Hematological: Bruises/bleeds easily.  Psychiatric/Behavioral: Positive for sleep disturbance. Negative for agitation, behavioral problems and confusion. The patient is nervous/anxious.        Uses the xanax to help with sleep    Vitals:   07/16/18 1544  BP: 140/76  Pulse: 62  Resp: (!) 22  Temp: (!) 97.3 F (36.3 C)  TempSrc: Oral  SpO2: 97%  Weight: 173 lb (78.5 kg)  Height: 6\' 2"  (1.88 m)   Body mass index is 22.21 kg/m. Physical Exam Vitals signs reviewed.  Constitutional:      General: He is not in acute distress.    Appearance: Normal appearance. He is normal weight. He is not toxic-appearing.  HENT:     Head: Normocephalic and atraumatic.     Right Ear: External ear normal.     Left Ear: External ear normal.     Ears:     Comments: Hearing aids    Nose: Nose normal.     Mouth/Throat:     Mouth: Mucous membranes are moist.     Pharynx: Oropharynx is clear.  Eyes:     Extraocular Movements: Extraocular movements intact.     Conjunctiva/sclera: Conjunctivae normal.     Pupils: Pupils are equal, round, and reactive to light.  Neck:     Musculoskeletal: Neck supple.  Cardiovascular:     Rate and Rhythm: Normal rate and regular rhythm.     Pulses: Normal pulses.     Heart sounds: Normal heart sounds.  Pulmonary:     Effort: Pulmonary effort is normal.     Breath sounds: Normal breath sounds.  Abdominal:     General: Bowel sounds are normal. There is no distension.  Palpations: Abdomen is soft.     Tenderness: There is no abdominal tenderness.  Musculoskeletal: Normal range of motion.     Right lower leg: No edema.     Left lower leg: No edema.     Comments: Stooped posture, walks with rollator walker  Skin:    General: Skin is warm  and dry.  Neurological:     General: No focal deficit present.     Mental Status: He is alert and oriented to person, place, and time.     Cranial Nerves: No cranial nerve deficit.     Sensory: No sensory deficit.     Motor: No weakness.     Gait: Gait abnormal.  Psychiatric:        Mood and Affect: Mood normal.        Behavior: Behavior normal.     Labs reviewed: Basic Metabolic Panel: Recent Labs    04/07/18 0715 04/07/18 0900 04/11/18 0541 04/18/18 0528  NA 139  --  138 141  K 4.1  --  3.8 4.0  CL 105  --  106 108  CO2 23  --  24 28  GLUCOSE 91  --  101* 97  BUN 13  --  11 19  CREATININE 0.95  --  0.92 0.92  CALCIUM 10.2  --  9.7 9.3  MG  --  2.2  --   --   PHOS  --  2.8  --   --    Liver Function Tests: Recent Labs    04/06/18 0840 04/07/18 0715 04/11/18 0541  AST 22 25 19   ALT 22 21 21   ALKPHOS 53 61 51  BILITOT 1.2 1.1 0.9  PROT 6.7 7.1 5.8*  ALBUMIN 4.1 3.9 3.4*   No results for input(s): LIPASE, AMYLASE in the last 8760 hours. No results for input(s): AMMONIA in the last 8760 hours. CBC: Recent Labs    04/06/18 0840 04/06/18 1806 04/07/18 0715 04/11/18 0541  WBC 6.8 7.8 6.9 7.2  NEUTROABS 4.2  --   --  4.5  HGB 14.4 15.3 15.8 14.4  HCT 45.4 47.3 49.2 43.9  MCV 90.3 85.8 85.9 84.4  PLT 193 185 179 202   Cardiac Enzymes: Recent Labs    10/15/17 1500 04/06/18 0840  TROPONINI <0.03 <0.03   BNP: Invalid input(s): POCBNP Lab Results  Component Value Date   HGBA1C 5.5 04/06/2018   Lab Results  Component Value Date   TSH 1.649 04/06/2018   No results found for: VITAMINB12 No results found for: FOLATE No results found for: IRON, TIBC, FERRITIN  Imaging and Procedures obtained prior to SNF admission: Mr Cardiac Morphology W Wo Contrast  Result Date: 04/10/2018 CLINICAL DATA:  85 year old male with suspicion for a left atrial mass on an echocardiogram. EXAM: CARDIAC MRI TECHNIQUE: The patient was scanned on a 1.5 Tesla GE magnet. A  dedicated cardiac coil was used. Functional imaging was done using [place] sequences. 2,3, and 4 chamber views were done to assess for RWMA's. Modified Simpson's rule using a short axis stack was used to calculate an ejection fraction on a dedicated work Conservation officer, nature. The patient received 8 cc of Gadavist. After 10 minutes inversion recovery sequences were used to assess for infiltration and scar tissue. CONTRAST:  8 cc  of Gadavist FINDINGS: 1. Normal left ventricular size, thickness and systolic function (LVEF = 56%). There are no regional wall motion abnormalities. There is no late gadolinium enhancement in the left ventricular myocardium. LVEDD:  42 mm LVESD: 34 mm LVEDV: 89 ml LVESV: 39 ml SV: 50 ml CO: 4.3 ml Myocardial mass: 75 g 2. Normal right ventricular size, thickness and systolic function (LVEF = 48%). There are no regional wall motion abnormalities. 3.  Normal left atrial size.  Mildly dilated right atrium. 4. Normal size of the aortic root, ascending aorta and pulmonary artery. 5.  Trivial mitral and tricuspid regurgitation. 6.  Normal pericardium.  No pericardial effusion. IMPRESSION: 1. Normal left ventricular size, thickness and systolic function (LVEF = 56%). There are no regional wall motion abnormalities. There is no late gadolinium enhancement in the left ventricular myocardium. 2. Normal right ventricular size, thickness and systolic function (LVEF = 48%). There are no regional wall motion abnormalities. 3.  Normal left atrial size.  Mildly dilated right atrium. 4.  Trivial mitral and tricuspid regurgitation. There is no evidence of a left atrial mass. Ena Dawley Electronically Signed   By: Ena Dawley   On: 04/10/2018 20:05   Assessment/Plan 1. Left sided lacunar stroke ([place]) -seems to have left him with mild right leg weakness, difficulty with coordination, some memory loss and word-finding difficulty -continue with PT -has short term asa and plavix -now on  plavix alone long-term  2. Weakness of right lower extremity -appears quite mild, continue PT  3. Hypoalbuminemia due to protein-calorie malnutrition ([place]) -appears he's eating well here now and adjusting -staff are trying to make arrangements so he and his wife can eat together  4. Glaucoma of both eyes, unspecified glaucoma type -f/u with Dr. Herbert Deaner when he's able to safely leave [facility] post covid -cont xalatan  5. Sleep disturbance -is on xanax to help with anxiety that affects his sleep -may try to taper this after he adjusts  -is on celexa also  6. Dyslipidemia -cont lipitor therapy -lipid panel will be needed in about 6 mos to see if he's on the right dose   7. Genuine stress incontinence, male -uses adult undergarments so continue these  8. Benign essential HTN -not on bp meds; bp is satisfactory at this time given his age and fall risk  9.  Cognitive impairment -appears this is new post-stroke and he was quite independent in fact caring for his wife before; will monitor MMSE and functional status -cont AL level of care  Family/ staff Communication: discussed with pt, wife and AL nursing  Labs/tests ordered:  No new  Javone Ybanez L. Amity Roes, D.O. [facility] [address],  [address] Cell Phone (Mon-Fri 8am-5pm):  [phone] On Call:  [phone] & follow prompts after 5pm & weekends Office Phone:  [phone] Office Fax:  [phone]

## 2018-07-17 DIAGNOSIS — R278 Other lack of coordination: Secondary | ICD-10-CM | POA: Diagnosis not present

## 2018-07-17 DIAGNOSIS — R2689 Other abnormalities of gait and mobility: Secondary | ICD-10-CM | POA: Diagnosis not present

## 2018-07-17 DIAGNOSIS — M6389 Disorders of muscle in diseases classified elsewhere, multiple sites: Secondary | ICD-10-CM | POA: Diagnosis not present

## 2018-07-17 DIAGNOSIS — M170 Bilateral primary osteoarthritis of knee: Secondary | ICD-10-CM | POA: Diagnosis not present

## 2018-07-17 DIAGNOSIS — I6389 Other cerebral infarction: Secondary | ICD-10-CM | POA: Diagnosis not present

## 2018-07-17 DIAGNOSIS — I6381 Other cerebral infarction due to occlusion or stenosis of small artery: Secondary | ICD-10-CM | POA: Diagnosis not present

## 2018-07-23 DIAGNOSIS — M170 Bilateral primary osteoarthritis of knee: Secondary | ICD-10-CM | POA: Diagnosis not present

## 2018-07-23 DIAGNOSIS — I6381 Other cerebral infarction due to occlusion or stenosis of small artery: Secondary | ICD-10-CM | POA: Diagnosis not present

## 2018-07-23 DIAGNOSIS — R2689 Other abnormalities of gait and mobility: Secondary | ICD-10-CM | POA: Diagnosis not present

## 2018-07-23 DIAGNOSIS — R278 Other lack of coordination: Secondary | ICD-10-CM | POA: Diagnosis not present

## 2018-07-23 DIAGNOSIS — M6389 Disorders of muscle in diseases classified elsewhere, multiple sites: Secondary | ICD-10-CM | POA: Diagnosis not present

## 2018-07-23 DIAGNOSIS — Z9181 History of falling: Secondary | ICD-10-CM | POA: Diagnosis not present

## 2018-07-23 DIAGNOSIS — I6389 Other cerebral infarction: Secondary | ICD-10-CM | POA: Diagnosis not present

## 2018-07-24 DIAGNOSIS — M6389 Disorders of muscle in diseases classified elsewhere, multiple sites: Secondary | ICD-10-CM | POA: Diagnosis not present

## 2018-07-24 DIAGNOSIS — I6389 Other cerebral infarction: Secondary | ICD-10-CM | POA: Diagnosis not present

## 2018-07-24 DIAGNOSIS — I6381 Other cerebral infarction due to occlusion or stenosis of small artery: Secondary | ICD-10-CM | POA: Diagnosis not present

## 2018-07-24 DIAGNOSIS — M170 Bilateral primary osteoarthritis of knee: Secondary | ICD-10-CM | POA: Diagnosis not present

## 2018-07-24 DIAGNOSIS — R278 Other lack of coordination: Secondary | ICD-10-CM | POA: Diagnosis not present

## 2018-07-24 DIAGNOSIS — R2689 Other abnormalities of gait and mobility: Secondary | ICD-10-CM | POA: Diagnosis not present

## 2018-07-25 DIAGNOSIS — R2689 Other abnormalities of gait and mobility: Secondary | ICD-10-CM | POA: Diagnosis not present

## 2018-07-25 DIAGNOSIS — M170 Bilateral primary osteoarthritis of knee: Secondary | ICD-10-CM | POA: Diagnosis not present

## 2018-07-25 DIAGNOSIS — M6389 Disorders of muscle in diseases classified elsewhere, multiple sites: Secondary | ICD-10-CM | POA: Diagnosis not present

## 2018-07-25 DIAGNOSIS — R4189 Other symptoms and signs involving cognitive functions and awareness: Secondary | ICD-10-CM | POA: Insufficient documentation

## 2018-07-25 DIAGNOSIS — R29898 Other symptoms and signs involving the musculoskeletal system: Secondary | ICD-10-CM | POA: Insufficient documentation

## 2018-07-25 DIAGNOSIS — I6389 Other cerebral infarction: Secondary | ICD-10-CM | POA: Diagnosis not present

## 2018-07-25 DIAGNOSIS — R278 Other lack of coordination: Secondary | ICD-10-CM | POA: Diagnosis not present

## 2018-07-25 DIAGNOSIS — I6381 Other cerebral infarction due to occlusion or stenosis of small artery: Secondary | ICD-10-CM | POA: Diagnosis not present

## 2018-07-29 DIAGNOSIS — M6389 Disorders of muscle in diseases classified elsewhere, multiple sites: Secondary | ICD-10-CM | POA: Diagnosis not present

## 2018-07-29 DIAGNOSIS — M170 Bilateral primary osteoarthritis of knee: Secondary | ICD-10-CM | POA: Diagnosis not present

## 2018-07-29 DIAGNOSIS — R2689 Other abnormalities of gait and mobility: Secondary | ICD-10-CM | POA: Diagnosis not present

## 2018-07-29 DIAGNOSIS — R278 Other lack of coordination: Secondary | ICD-10-CM | POA: Diagnosis not present

## 2018-07-29 DIAGNOSIS — I6389 Other cerebral infarction: Secondary | ICD-10-CM | POA: Diagnosis not present

## 2018-07-29 DIAGNOSIS — I6381 Other cerebral infarction due to occlusion or stenosis of small artery: Secondary | ICD-10-CM | POA: Diagnosis not present

## 2018-07-31 DIAGNOSIS — R2689 Other abnormalities of gait and mobility: Secondary | ICD-10-CM | POA: Diagnosis not present

## 2018-07-31 DIAGNOSIS — I6381 Other cerebral infarction due to occlusion or stenosis of small artery: Secondary | ICD-10-CM | POA: Diagnosis not present

## 2018-07-31 DIAGNOSIS — M6389 Disorders of muscle in diseases classified elsewhere, multiple sites: Secondary | ICD-10-CM | POA: Diagnosis not present

## 2018-07-31 DIAGNOSIS — I6389 Other cerebral infarction: Secondary | ICD-10-CM | POA: Diagnosis not present

## 2018-07-31 DIAGNOSIS — R278 Other lack of coordination: Secondary | ICD-10-CM | POA: Diagnosis not present

## 2018-07-31 DIAGNOSIS — M170 Bilateral primary osteoarthritis of knee: Secondary | ICD-10-CM | POA: Diagnosis not present

## 2018-08-01 ENCOUNTER — Encounter: Payer: Self-pay | Admitting: Internal Medicine

## 2018-08-02 DIAGNOSIS — M6389 Disorders of muscle in diseases classified elsewhere, multiple sites: Secondary | ICD-10-CM | POA: Diagnosis not present

## 2018-08-02 DIAGNOSIS — R278 Other lack of coordination: Secondary | ICD-10-CM | POA: Diagnosis not present

## 2018-08-02 DIAGNOSIS — I6381 Other cerebral infarction due to occlusion or stenosis of small artery: Secondary | ICD-10-CM | POA: Diagnosis not present

## 2018-08-02 DIAGNOSIS — I6389 Other cerebral infarction: Secondary | ICD-10-CM | POA: Diagnosis not present

## 2018-08-02 DIAGNOSIS — R2689 Other abnormalities of gait and mobility: Secondary | ICD-10-CM | POA: Diagnosis not present

## 2018-08-02 DIAGNOSIS — M170 Bilateral primary osteoarthritis of knee: Secondary | ICD-10-CM | POA: Diagnosis not present

## 2018-08-05 DIAGNOSIS — R278 Other lack of coordination: Secondary | ICD-10-CM | POA: Diagnosis not present

## 2018-08-05 DIAGNOSIS — R2689 Other abnormalities of gait and mobility: Secondary | ICD-10-CM | POA: Diagnosis not present

## 2018-08-05 DIAGNOSIS — M170 Bilateral primary osteoarthritis of knee: Secondary | ICD-10-CM | POA: Diagnosis not present

## 2018-08-05 DIAGNOSIS — M6389 Disorders of muscle in diseases classified elsewhere, multiple sites: Secondary | ICD-10-CM | POA: Diagnosis not present

## 2018-08-05 DIAGNOSIS — I6381 Other cerebral infarction due to occlusion or stenosis of small artery: Secondary | ICD-10-CM | POA: Diagnosis not present

## 2018-08-05 DIAGNOSIS — I6389 Other cerebral infarction: Secondary | ICD-10-CM | POA: Diagnosis not present

## 2018-08-07 DIAGNOSIS — R2689 Other abnormalities of gait and mobility: Secondary | ICD-10-CM | POA: Diagnosis not present

## 2018-08-07 DIAGNOSIS — D649 Anemia, unspecified: Secondary | ICD-10-CM | POA: Diagnosis not present

## 2018-08-07 DIAGNOSIS — M170 Bilateral primary osteoarthritis of knee: Secondary | ICD-10-CM | POA: Diagnosis not present

## 2018-08-07 DIAGNOSIS — M6389 Disorders of muscle in diseases classified elsewhere, multiple sites: Secondary | ICD-10-CM | POA: Diagnosis not present

## 2018-08-07 DIAGNOSIS — R278 Other lack of coordination: Secondary | ICD-10-CM | POA: Diagnosis not present

## 2018-08-07 DIAGNOSIS — I6381 Other cerebral infarction due to occlusion or stenosis of small artery: Secondary | ICD-10-CM | POA: Diagnosis not present

## 2018-08-07 DIAGNOSIS — I10 Essential (primary) hypertension: Secondary | ICD-10-CM | POA: Diagnosis not present

## 2018-08-07 DIAGNOSIS — I6389 Other cerebral infarction: Secondary | ICD-10-CM | POA: Diagnosis not present

## 2018-08-07 DIAGNOSIS — I951 Orthostatic hypotension: Secondary | ICD-10-CM | POA: Diagnosis not present

## 2018-08-07 LAB — CBC AND DIFFERENTIAL
HCT: 43 (ref 41–53)
Hemoglobin: 14.6 (ref 13.5–17.5)
Platelets: 206 (ref 150–399)
WBC: 6.7

## 2018-08-07 LAB — BASIC METABOLIC PANEL
BUN: 17 (ref 4–21)
Creatinine: 0.8 (ref 0.6–1.3)
Glucose: 98
Potassium: 4.6 (ref 3.4–5.3)
Sodium: 141 (ref 137–147)

## 2018-08-08 ENCOUNTER — Encounter: Payer: Medicare Other | Attending: Physical Medicine & Rehabilitation | Admitting: Physical Medicine & Rehabilitation

## 2018-08-08 ENCOUNTER — Other Ambulatory Visit: Payer: Self-pay

## 2018-08-08 DIAGNOSIS — I10 Essential (primary) hypertension: Secondary | ICD-10-CM | POA: Insufficient documentation

## 2018-08-08 DIAGNOSIS — R269 Unspecified abnormalities of gait and mobility: Secondary | ICD-10-CM | POA: Insufficient documentation

## 2018-08-08 DIAGNOSIS — F411 Generalized anxiety disorder: Secondary | ICD-10-CM | POA: Insufficient documentation

## 2018-08-08 DIAGNOSIS — R195 Other fecal abnormalities: Secondary | ICD-10-CM | POA: Insufficient documentation

## 2018-08-08 DIAGNOSIS — I6381 Other cerebral infarction due to occlusion or stenosis of small artery: Secondary | ICD-10-CM | POA: Insufficient documentation

## 2018-08-08 DIAGNOSIS — G479 Sleep disorder, unspecified: Secondary | ICD-10-CM | POA: Insufficient documentation

## 2018-08-10 DIAGNOSIS — I6389 Other cerebral infarction: Secondary | ICD-10-CM | POA: Diagnosis not present

## 2018-08-10 DIAGNOSIS — R278 Other lack of coordination: Secondary | ICD-10-CM | POA: Diagnosis not present

## 2018-08-10 DIAGNOSIS — R2689 Other abnormalities of gait and mobility: Secondary | ICD-10-CM | POA: Diagnosis not present

## 2018-08-10 DIAGNOSIS — M6389 Disorders of muscle in diseases classified elsewhere, multiple sites: Secondary | ICD-10-CM | POA: Diagnosis not present

## 2018-08-10 DIAGNOSIS — M170 Bilateral primary osteoarthritis of knee: Secondary | ICD-10-CM | POA: Diagnosis not present

## 2018-08-10 DIAGNOSIS — I6381 Other cerebral infarction due to occlusion or stenosis of small artery: Secondary | ICD-10-CM | POA: Diagnosis not present

## 2018-08-12 DIAGNOSIS — R278 Other lack of coordination: Secondary | ICD-10-CM | POA: Diagnosis not present

## 2018-08-12 DIAGNOSIS — I6389 Other cerebral infarction: Secondary | ICD-10-CM | POA: Diagnosis not present

## 2018-08-12 DIAGNOSIS — I6381 Other cerebral infarction due to occlusion or stenosis of small artery: Secondary | ICD-10-CM | POA: Diagnosis not present

## 2018-08-12 DIAGNOSIS — M170 Bilateral primary osteoarthritis of knee: Secondary | ICD-10-CM | POA: Diagnosis not present

## 2018-08-12 DIAGNOSIS — M6389 Disorders of muscle in diseases classified elsewhere, multiple sites: Secondary | ICD-10-CM | POA: Diagnosis not present

## 2018-08-12 DIAGNOSIS — R2689 Other abnormalities of gait and mobility: Secondary | ICD-10-CM | POA: Diagnosis not present

## 2018-08-13 ENCOUNTER — Telehealth: Payer: Self-pay | Admitting: Neurology

## 2018-08-13 DIAGNOSIS — I6381 Other cerebral infarction due to occlusion or stenosis of small artery: Secondary | ICD-10-CM | POA: Diagnosis not present

## 2018-08-13 DIAGNOSIS — R278 Other lack of coordination: Secondary | ICD-10-CM | POA: Diagnosis not present

## 2018-08-13 DIAGNOSIS — R2689 Other abnormalities of gait and mobility: Secondary | ICD-10-CM | POA: Diagnosis not present

## 2018-08-13 DIAGNOSIS — I6389 Other cerebral infarction: Secondary | ICD-10-CM | POA: Diagnosis not present

## 2018-08-13 DIAGNOSIS — M170 Bilateral primary osteoarthritis of knee: Secondary | ICD-10-CM | POA: Diagnosis not present

## 2018-08-13 DIAGNOSIS — M6389 Disorders of muscle in diseases classified elsewhere, multiple sites: Secondary | ICD-10-CM | POA: Diagnosis not present

## 2018-08-13 NOTE — Telephone Encounter (Signed)
Due to current COVID 19 pandemic, our office is severely reducing in office visits until further notice, in order to minimize the risk to our patients and healthcare providers.   I called patient regarding his 6/1 appointment. Patient declined an in office visit at this time. We rescheduled an in office visit for July 1st. Patient seemed very confused about this appointment. I asked patient if there was anyone that I could speak to regarding this appointment. Patient stated that there was no one, and states that his wife is in skilled care. I advised that we would touch base again prior to the rescheduled date. Nurse may want to reach out to patient for clarification.

## 2018-08-14 ENCOUNTER — Encounter: Payer: Self-pay | Admitting: Internal Medicine

## 2018-08-14 DIAGNOSIS — D649 Anemia, unspecified: Secondary | ICD-10-CM | POA: Diagnosis not present

## 2018-08-14 DIAGNOSIS — I951 Orthostatic hypotension: Secondary | ICD-10-CM | POA: Diagnosis not present

## 2018-08-14 DIAGNOSIS — M6389 Disorders of muscle in diseases classified elsewhere, multiple sites: Secondary | ICD-10-CM | POA: Diagnosis not present

## 2018-08-14 DIAGNOSIS — M170 Bilateral primary osteoarthritis of knee: Secondary | ICD-10-CM | POA: Diagnosis not present

## 2018-08-14 DIAGNOSIS — R278 Other lack of coordination: Secondary | ICD-10-CM | POA: Diagnosis not present

## 2018-08-14 DIAGNOSIS — R2689 Other abnormalities of gait and mobility: Secondary | ICD-10-CM | POA: Diagnosis not present

## 2018-08-14 DIAGNOSIS — I6389 Other cerebral infarction: Secondary | ICD-10-CM | POA: Diagnosis not present

## 2018-08-14 DIAGNOSIS — I6381 Other cerebral infarction due to occlusion or stenosis of small artery: Secondary | ICD-10-CM | POA: Diagnosis not present

## 2018-08-14 LAB — BASIC METABOLIC PANEL
BUN: 19 (ref 4–21)
Creatinine: 1.1 (ref 0.6–1.3)
Glucose: 84
Potassium: 4.5 (ref 3.4–5.3)
Sodium: 139 (ref 137–147)

## 2018-08-19 ENCOUNTER — Ambulatory Visit: Payer: Medicare Other | Admitting: Neurology

## 2018-08-19 DIAGNOSIS — Z9181 History of falling: Secondary | ICD-10-CM | POA: Diagnosis not present

## 2018-08-19 DIAGNOSIS — M6389 Disorders of muscle in diseases classified elsewhere, multiple sites: Secondary | ICD-10-CM | POA: Diagnosis not present

## 2018-08-19 DIAGNOSIS — I6389 Other cerebral infarction: Secondary | ICD-10-CM | POA: Diagnosis not present

## 2018-08-19 DIAGNOSIS — R2689 Other abnormalities of gait and mobility: Secondary | ICD-10-CM | POA: Diagnosis not present

## 2018-08-19 DIAGNOSIS — I6381 Other cerebral infarction due to occlusion or stenosis of small artery: Secondary | ICD-10-CM | POA: Diagnosis not present

## 2018-08-19 DIAGNOSIS — M170 Bilateral primary osteoarthritis of knee: Secondary | ICD-10-CM | POA: Diagnosis not present

## 2018-08-19 DIAGNOSIS — R278 Other lack of coordination: Secondary | ICD-10-CM | POA: Diagnosis not present

## 2018-08-21 ENCOUNTER — Encounter: Payer: Self-pay | Admitting: Internal Medicine

## 2018-08-21 DIAGNOSIS — I6389 Other cerebral infarction: Secondary | ICD-10-CM | POA: Diagnosis not present

## 2018-08-21 DIAGNOSIS — M6389 Disorders of muscle in diseases classified elsewhere, multiple sites: Secondary | ICD-10-CM | POA: Diagnosis not present

## 2018-08-21 DIAGNOSIS — M170 Bilateral primary osteoarthritis of knee: Secondary | ICD-10-CM | POA: Diagnosis not present

## 2018-08-21 DIAGNOSIS — I6381 Other cerebral infarction due to occlusion or stenosis of small artery: Secondary | ICD-10-CM | POA: Diagnosis not present

## 2018-08-21 DIAGNOSIS — R2689 Other abnormalities of gait and mobility: Secondary | ICD-10-CM | POA: Diagnosis not present

## 2018-08-21 DIAGNOSIS — R278 Other lack of coordination: Secondary | ICD-10-CM | POA: Diagnosis not present

## 2018-08-23 DIAGNOSIS — D649 Anemia, unspecified: Secondary | ICD-10-CM | POA: Diagnosis not present

## 2018-08-23 DIAGNOSIS — I10 Essential (primary) hypertension: Secondary | ICD-10-CM | POA: Diagnosis not present

## 2018-08-23 DIAGNOSIS — F411 Generalized anxiety disorder: Secondary | ICD-10-CM | POA: Diagnosis not present

## 2018-08-23 LAB — BASIC METABOLIC PANEL
BUN: 19 (ref 4–21)
Creatinine: 1 (ref 0.6–1.3)
Glucose: 94
Potassium: 4 (ref 3.4–5.3)
Sodium: 139 (ref 137–147)

## 2018-08-26 DIAGNOSIS — I6381 Other cerebral infarction due to occlusion or stenosis of small artery: Secondary | ICD-10-CM | POA: Diagnosis not present

## 2018-08-26 DIAGNOSIS — M6389 Disorders of muscle in diseases classified elsewhere, multiple sites: Secondary | ICD-10-CM | POA: Diagnosis not present

## 2018-08-26 DIAGNOSIS — I6389 Other cerebral infarction: Secondary | ICD-10-CM | POA: Diagnosis not present

## 2018-08-26 DIAGNOSIS — R2689 Other abnormalities of gait and mobility: Secondary | ICD-10-CM | POA: Diagnosis not present

## 2018-08-26 DIAGNOSIS — R278 Other lack of coordination: Secondary | ICD-10-CM | POA: Diagnosis not present

## 2018-08-26 DIAGNOSIS — M170 Bilateral primary osteoarthritis of knee: Secondary | ICD-10-CM | POA: Diagnosis not present

## 2018-08-28 ENCOUNTER — Non-Acute Institutional Stay: Payer: Medicare Other | Admitting: Internal Medicine

## 2018-08-28 ENCOUNTER — Other Ambulatory Visit: Payer: Self-pay

## 2018-08-28 ENCOUNTER — Encounter: Payer: Self-pay | Admitting: Internal Medicine

## 2018-08-28 VITALS — BP 142/80 | HR 86 | Temp 98.1°F | Ht 74.0 in | Wt 182.0 lb

## 2018-08-28 DIAGNOSIS — I6381 Other cerebral infarction due to occlusion or stenosis of small artery: Secondary | ICD-10-CM | POA: Diagnosis not present

## 2018-08-28 DIAGNOSIS — R42 Dizziness and giddiness: Secondary | ICD-10-CM

## 2018-08-28 DIAGNOSIS — Z20828 Contact with and (suspected) exposure to other viral communicable diseases: Secondary | ICD-10-CM | POA: Diagnosis not present

## 2018-08-28 DIAGNOSIS — G9340 Encephalopathy, unspecified: Secondary | ICD-10-CM

## 2018-08-28 NOTE — Progress Notes (Signed)
Patient ID: LYNDEL SARATE, male   DOB: 1929/04/07, 85 y.o.   MRN: [id number]  Location:  [facility] Room Number: 720 Place of Service:  ALF [phone]) Provider:  Gayland Curry, DO  Patient Care Team: Gayland Curry, DO as PCP - General (Geriatric Medicine)  Extended Emergency Contact Information Primary Emergency Contact: Zegarra,[name] Address: [address]          [place] [address] [name] of [place] Phone: [phone] Relation: Spouse Secondary Emergency Contact: [name] Mobile Phone: [phone] Relation: Nephew   Goals of care: Advanced Directive information Advanced Directives 08/28/2018  Does Patient Have a Medical Advance Directive? Yes  Type of Advance Directive [place]  Does patient want to make changes to medical advance directive? No - Patient declined  Copy of [place] in Chart? Yes - validated most recent copy scanned in chart (See row information)  Would patient like information on creating a medical advance directive? -     Chief Complaint  Patient presents with  . Acute Visit    shortness of breath x1 week    HPI:  Pt is a 85 y.o. male seen today for an acute visit for shortness of breath x 1 week.  He reports this but does not appears sob and staff on AL have not seen him appearing dyspneic though DON had seen him looking dyspneic at rest.  He has not chest pain.  His primary complaint when I met with him was his fear of falling and poor balance.  He notes he does not have vertigo or any spinning sensation.  He notes the dizziness is when he stands, but he also feels very unsteady while walking.  His recent fall entailed getting up and quickly moving without steadying himself first.  He also demonstrated in my presence that he is not faithful with use of his rollator walker.  He had it pushed off to the side and wanted to be gentlemanly and get me a seat in  his apt for our visit.  He was wavering front to back and nearly had to sit back in his chair upon standing.  He has no new weakness, numbness, speech changes (still struggles with word finding but appears about the same as admission).  His knees also hurt him quite a bit on standing.  Vision is poor by his report.    At our last visit, we'd adjusted his medications and the only new thing is irbesartan up to 149m daily.  He is hydrating much better now--he drinks water with his meds and admits staff are now giving him water at meals (he said he was only getting tea or juice before b/c he was picking that but CNA has made sure he's now getting water, as well).    He feels a bit down about he and his wife's move here and the circumstances of it and now the covid isolation.  He feels he cannot do without his xanax at this point b/c we discussed the side effects of it affecting balance with age.     Past Medical History:  Diagnosis Date  . Abnormal gait   . Adenocarcinoma of prostate ([place]   . Anxiety   . Arthritis    hands  . Balance problem   . Benzodiazepine dependence ([facility]   . Blood transfusion without reported diagnosis    pt thinks had blood trnsfusion at prostate ca surgery   . Blurred  vision   . Carotid atherosclerosis   . Cataract   . Depression, major   . Glaucoma   . Hearing loss   . High cholesterol   . Hypertension   . Knee pain, bilateral   . Lacunar infarction ([place])   . Lumbar radiculopathy   . Ophthalmic herpes zoster   . Prostate cancer ([place])   . Sacroiliitis ([place])   . Vertigo    Past Surgical History:  Procedure Laterality Date  . COLONOSCOPY    . HERNIA REPAIR    . PROSTATECTOMY      Allergies  Allergen Reactions  . Rofecoxib Swelling    REACTION: swelling- Vioxx name brand     Outpatient Encounter Medications as of 08/28/2018  Medication Sig  . acetaminophen (TYLENOL) 325 MG tablet Take 1-2 tablets (325-650 mg total) by mouth every 4 (four) hours as  needed for mild pain.  Marland Kitchen ALPRAZolam (XANAX) 0.25 MG tablet Take 1 tablet (0.25 mg total) by mouth 2 (two) times daily as needed for anxiety or sleep.  Marland Kitchen atorvastatin (LIPITOR) 20 MG tablet Take 1 tablet (20 mg total) by mouth daily at 6 PM.  . citalopram (CELEXA) 20 MG tablet Take 1 tablet (20 mg total) by mouth daily.  . clopidogrel (PLAVIX) 75 MG tablet Take 1 tablet (75 mg total) by mouth daily.  . irbesartan (AVAPRO) 150 MG tablet Take 1 tablet by mouth daily.  Marland Kitchen latanoprost (XALATAN) 0.005 % ophthalmic solution Place 1 drop into the right eye at bedtime.   . lidocaine (LIDODERM) 5 % Place 1 patch onto the skin daily. Remove & Discard patch within 12 hours or as directed by MD  . polyethylene glycol (MIRALAX / GLYCOLAX) packet Take 17 g by mouth daily as needed for mild constipation.  . Probiotic Product (ALIGN PO) Take 1 capsule by mouth daily.   No facility-administered encounter medications on file as of 08/28/2018.     Review of Systems  Constitutional: Positive for fatigue. Negative for activity change, chills and fever.       Reports he can nap through the day and still sleep very well at night  HENT: Positive for hearing loss. Negative for congestion.   Eyes: Negative for visual disturbance.       Can't see well  Respiratory: Positive for shortness of breath. Negative for cough and chest tightness.   Cardiovascular: Negative for chest pain, palpitations and leg swelling.  Gastrointestinal: Negative for abdominal pain.  Genitourinary: Positive for frequency.  Musculoskeletal: Positive for arthralgias and gait problem.  Skin: Negative for color change.  Neurological: Positive for dizziness, speech difficulty and light-headedness. Negative for tremors, syncope, facial asymmetry, weakness, numbness and headaches.       Difficulty with word finding  Hematological: Bruises/bleeds easily.  Psychiatric/Behavioral: Negative for sleep disturbance. The patient is nervous/anxious.         Intermittent confusion and short term memory loss     Immunization History  Administered Date(s) Administered  . Influenza, High Dose Seasonal PF 01/04/2017  . Influenza-Unspecified 12/10/2017  . Pneumococcal Conjugate-13 01/30/2014  . Pneumococcal Polysaccharide-23 11/13/2006  . Tdap 11/15/2007  . Zoster 11/15/2007   Pertinent  Health Maintenance Due  Topic Date Due  . INFLUENZA VACCINE  10/19/2018  . PNA vac Low Risk Adult  Completed   Fall Risk  05/30/2018  Falls in the past year? 1  Number falls in past yr: 0   Functional Status Survey:  getting OT right now; had PT, but reports little  improvement   Vitals:   08/28/18 1532  BP: (!) 142/80  Pulse: 86  Temp: 98.1 F (36.7 C)  TempSrc: Oral  SpO2: 98%  Weight: 182 lb (82.6 kg)  Height: _0  (1.88 m)   Body mass index is 23.37 kg/m. Physical Exam Constitutional:      General: He is not in acute distress.    Appearance: He is normal weight. He is not toxic-appearing.  HENT:     Head: Normocephalic and atraumatic.     Ears:     Comments: HOH; wearing hearing aid Eyes:     Extraocular Movements: Extraocular movements intact.     Pupils: Pupils are equal, round, and reactive to light.  Cardiovascular:     Rate and Rhythm: Normal rate and regular rhythm.  Pulmonary:     Effort: Pulmonary effort is normal.     Breath sounds: Normal breath sounds. No rales.  Musculoskeletal: Normal range of motion.  Skin:    General: Skin is warm and dry.     Capillary Refill: Capillary refill takes less than 2 seconds.  Neurological:     Mental Status: He is alert.     Cranial Nerves: No cranial nerve deficit.     Sensory: No sensory deficit.     Coordination: Coordination abnormal.     Gait: Gait abnormal.     Comments: Must use arms to get up out of chair; positive Romberg and poor coordination of heel-to-shin left leg; gait appears like he's trying to walk on a boat at sea  Psychiatric:        Mood and Affect: Mood  normal.     Comments: Jokes and teases some     Labs reviewed: Recent Labs    04/07/18 0715 04/07/18 0900 04/11/18 0541 04/18/18 0528 08/07/18 0600 08/14/18 0600 08/23/18 0700  NA 139  --  138 141 141 139 139  K 4.1  --  3.8 4.0 4.6 4.5 4.0  CL 105  --  106 108  --   --   --   CO2 23  --  24 28  --   --   --   GLUCOSE 91  --  101* 97  --   --   --   BUN 13  --  _1 CREATININE 0.95  --  0.92 0.92 0.8 1.1 1.0  CALCIUM 10.2  --  9.7 9.3  --   --   --   MG  --  2.2  --   --   --   --   --   PHOS  --  2.8  --   --   --   --   --    Recent Labs    04/06/18 0840 04/07/18 0715 04/11/18 0541  AST _2 ALT _3 ALKPHOS 53 61 51  BILITOT 1.2 1.1 0.9  PROT 6.7 7.1 5.8*  ALBUMIN 4.1 3.9 3.4*   Recent Labs    04/06/18 0840 04/06/18 1806 04/07/18 0715 04/11/18 0541 08/07/18 0600  WBC 6.8 7.8 6.9 7.2 6.7  NEUTROABS 4.2  --   --  4.5  --   HGB 14.4 15.3 15.8 14.4 14.6  HCT 45.4 47.3 49.2 43.9 43  MCV 90.3 85.8 85.9 84.4  --   PLT 193 185 179 202 206   Lab Results  Component Value Date   TSH 1.649 04/06/2018   Lab Results  Component Value Date  HGBA1C 5.5 04/06/2018   Lab Results  Component Value Date   CHOL 160 04/07/2018   HDL 47 04/07/2018   LDLCALC 89 04/07/2018   TRIG 122 04/07/2018   CHOLHDL 3.4 04/07/2018   Assessment/Plan 1. Positional lightheadedness -will reassess orthostatics since being on irbesartan 149m--I'm wondering if this is worsening his balance -if so, may need to change medication  -will also add compression hose to help with stabilizing bp -continue to push water  2. Left sided lacunar stroke ([facility] -he has some difficulty with coordination and word-finding since the stroke -had PT -needs to use walker consistently  3. Cerebellar dysfunction -seems this is his primary gait issue based on his neuro exam where he struggles with coordination of his left leg and has positive Romberg -unfortunately, outside of  regular faithful walker use, there is not a lot that can be done for this  Family/ staff Communication: discussed with AL nursing and CNAs, DON  Labs/tests ordered:  Orthostatics q am x 3 days and report to me; compression hose  Lonna Rabold L. Caliya Narine, D.O. [facility] [address] [id number] Phone (Mon-Fri 8am-5pm):  [phone] Call:  [phone]& follow prompts after 5pm & weekends Office Phone:  [phone] Fax:  [phone]

## 2018-08-29 ENCOUNTER — Telehealth: Payer: Self-pay

## 2018-08-29 NOTE — Telephone Encounter (Signed)
Patient called stating he was seen yesterday and forgot to tell Dr.Reed that he has diarrhea x 2-3 days. No other symptoms. Patient has not tried any OTC medications.  Please advise

## 2018-08-29 NOTE — Telephone Encounter (Signed)
Dee, please confirm with AL/matrix about these loose stools.

## 2018-08-30 NOTE — Telephone Encounter (Signed)
Spoke with patient and advised he would need to let the nurses at [facility] know about the diarrhea and they could possible give him something? Pt will check and call us back.

## 2018-09-03 DIAGNOSIS — I6389 Other cerebral infarction: Secondary | ICD-10-CM | POA: Diagnosis not present

## 2018-09-03 DIAGNOSIS — R2689 Other abnormalities of gait and mobility: Secondary | ICD-10-CM | POA: Diagnosis not present

## 2018-09-03 DIAGNOSIS — I6381 Other cerebral infarction due to occlusion or stenosis of small artery: Secondary | ICD-10-CM | POA: Diagnosis not present

## 2018-09-03 DIAGNOSIS — M6389 Disorders of muscle in diseases classified elsewhere, multiple sites: Secondary | ICD-10-CM | POA: Diagnosis not present

## 2018-09-03 DIAGNOSIS — M170 Bilateral primary osteoarthritis of knee: Secondary | ICD-10-CM | POA: Diagnosis not present

## 2018-09-03 DIAGNOSIS — R278 Other lack of coordination: Secondary | ICD-10-CM | POA: Diagnosis not present

## 2018-09-04 DIAGNOSIS — M1990 Unspecified osteoarthritis, unspecified site: Secondary | ICD-10-CM | POA: Diagnosis not present

## 2018-09-04 DIAGNOSIS — H269 Unspecified cataract: Secondary | ICD-10-CM | POA: Diagnosis not present

## 2018-09-04 DIAGNOSIS — R279 Unspecified lack of coordination: Secondary | ICD-10-CM | POA: Diagnosis not present

## 2018-09-04 DIAGNOSIS — M25561 Pain in right knee: Secondary | ICD-10-CM | POA: Diagnosis not present

## 2018-09-04 DIAGNOSIS — I708 Atherosclerosis of other arteries: Secondary | ICD-10-CM | POA: Diagnosis not present

## 2018-09-04 DIAGNOSIS — M5416 Radiculopathy, lumbar region: Secondary | ICD-10-CM | POA: Diagnosis not present

## 2018-09-04 DIAGNOSIS — C61 Malignant neoplasm of prostate: Secondary | ICD-10-CM | POA: Diagnosis not present

## 2018-09-04 DIAGNOSIS — I10 Essential (primary) hypertension: Secondary | ICD-10-CM | POA: Diagnosis not present

## 2018-09-04 DIAGNOSIS — H538 Other visual disturbances: Secondary | ICD-10-CM | POA: Diagnosis not present

## 2018-09-04 DIAGNOSIS — E785 Hyperlipidemia, unspecified: Secondary | ICD-10-CM | POA: Diagnosis not present

## 2018-09-04 DIAGNOSIS — M461 Sacroiliitis, not elsewhere classified: Secondary | ICD-10-CM | POA: Diagnosis not present

## 2018-09-04 DIAGNOSIS — B0230 Zoster ocular disease, unspecified: Secondary | ICD-10-CM | POA: Diagnosis not present

## 2018-09-05 DIAGNOSIS — R278 Other lack of coordination: Secondary | ICD-10-CM | POA: Diagnosis not present

## 2018-09-05 DIAGNOSIS — M6389 Disorders of muscle in diseases classified elsewhere, multiple sites: Secondary | ICD-10-CM | POA: Diagnosis not present

## 2018-09-05 DIAGNOSIS — I6381 Other cerebral infarction due to occlusion or stenosis of small artery: Secondary | ICD-10-CM | POA: Diagnosis not present

## 2018-09-05 DIAGNOSIS — M170 Bilateral primary osteoarthritis of knee: Secondary | ICD-10-CM | POA: Diagnosis not present

## 2018-09-05 DIAGNOSIS — I6389 Other cerebral infarction: Secondary | ICD-10-CM | POA: Diagnosis not present

## 2018-09-05 DIAGNOSIS — R2689 Other abnormalities of gait and mobility: Secondary | ICD-10-CM | POA: Diagnosis not present

## 2018-09-09 DIAGNOSIS — I6381 Other cerebral infarction due to occlusion or stenosis of small artery: Secondary | ICD-10-CM | POA: Diagnosis not present

## 2018-09-09 DIAGNOSIS — R2689 Other abnormalities of gait and mobility: Secondary | ICD-10-CM | POA: Diagnosis not present

## 2018-09-09 DIAGNOSIS — R278 Other lack of coordination: Secondary | ICD-10-CM | POA: Diagnosis not present

## 2018-09-09 DIAGNOSIS — M6389 Disorders of muscle in diseases classified elsewhere, multiple sites: Secondary | ICD-10-CM | POA: Diagnosis not present

## 2018-09-09 DIAGNOSIS — M170 Bilateral primary osteoarthritis of knee: Secondary | ICD-10-CM | POA: Diagnosis not present

## 2018-09-09 DIAGNOSIS — I6389 Other cerebral infarction: Secondary | ICD-10-CM | POA: Diagnosis not present

## 2018-09-16 ENCOUNTER — Telehealth: Payer: Self-pay | Admitting: Neurology

## 2018-09-16 NOTE — Telephone Encounter (Signed)
Due to current COVID 19 pandemic, our office is severely reducing in office visits until further notice, in order to minimize the risk to our patients and healthcare providers.   I called patient regarding 7/1 appt to see if patient would like to come in office for this visit, or to see if he would prefer a virtual visit at this time. Patient's voicemail was not available. I called patient's home phone and line continuously rang.

## 2018-09-17 DIAGNOSIS — M79671 Pain in right foot: Secondary | ICD-10-CM | POA: Diagnosis not present

## 2018-09-17 DIAGNOSIS — B351 Tinea unguium: Secondary | ICD-10-CM | POA: Diagnosis not present

## 2018-09-17 DIAGNOSIS — M79672 Pain in left foot: Secondary | ICD-10-CM | POA: Diagnosis not present

## 2018-09-18 ENCOUNTER — Ambulatory Visit: Payer: Self-pay | Admitting: Neurology

## 2018-09-18 ENCOUNTER — Telehealth: Payer: Self-pay | Admitting: Neurology

## 2018-09-18 NOTE — Telephone Encounter (Signed)
This patient did not show for a new patient appointment today. 

## 2018-09-19 DIAGNOSIS — M6389 Disorders of muscle in diseases classified elsewhere, multiple sites: Secondary | ICD-10-CM | POA: Diagnosis not present

## 2018-09-19 DIAGNOSIS — R278 Other lack of coordination: Secondary | ICD-10-CM | POA: Diagnosis not present

## 2018-09-19 DIAGNOSIS — I6381 Other cerebral infarction due to occlusion or stenosis of small artery: Secondary | ICD-10-CM | POA: Diagnosis not present

## 2018-09-19 DIAGNOSIS — M170 Bilateral primary osteoarthritis of knee: Secondary | ICD-10-CM | POA: Diagnosis not present

## 2018-09-26 DIAGNOSIS — M170 Bilateral primary osteoarthritis of knee: Secondary | ICD-10-CM | POA: Diagnosis not present

## 2018-09-26 DIAGNOSIS — R278 Other lack of coordination: Secondary | ICD-10-CM | POA: Diagnosis not present

## 2018-09-26 DIAGNOSIS — I6381 Other cerebral infarction due to occlusion or stenosis of small artery: Secondary | ICD-10-CM | POA: Diagnosis not present

## 2018-09-26 DIAGNOSIS — M6389 Disorders of muscle in diseases classified elsewhere, multiple sites: Secondary | ICD-10-CM | POA: Diagnosis not present

## 2018-10-07 ENCOUNTER — Other Ambulatory Visit: Payer: Self-pay | Admitting: Adult Health

## 2018-10-07 MED ORDER — ALPRAZOLAM 0.25 MG PO TABS: 0.2500 mg | ORAL_TABLET | Freq: Two times a day (BID) | ORAL | 1 refills | Status: DC | PRN

## 2018-10-23 ENCOUNTER — Encounter: Payer: Self-pay | Admitting: Internal Medicine

## 2018-10-23 ENCOUNTER — Non-Acute Institutional Stay: Payer: Medicare Other | Admitting: Internal Medicine

## 2018-10-23 ENCOUNTER — Other Ambulatory Visit: Payer: Self-pay

## 2018-10-23 VITALS — BP 160/90 | HR 94 | Temp 97.9°F | Ht 72.0 in | Wt 186.0 lb

## 2018-10-23 DIAGNOSIS — R4189 Other symptoms and signs involving cognitive functions and awareness: Secondary | ICD-10-CM

## 2018-10-23 DIAGNOSIS — G9340 Encephalopathy, unspecified: Secondary | ICD-10-CM | POA: Diagnosis not present

## 2018-10-23 DIAGNOSIS — K591 Functional diarrhea: Secondary | ICD-10-CM

## 2018-10-23 DIAGNOSIS — R42 Dizziness and giddiness: Secondary | ICD-10-CM | POA: Diagnosis not present

## 2018-10-23 DIAGNOSIS — I6381 Other cerebral infarction due to occlusion or stenosis of small artery: Secondary | ICD-10-CM

## 2018-10-23 DIAGNOSIS — R296 Repeated falls: Secondary | ICD-10-CM

## 2018-10-23 NOTE — Progress Notes (Signed)
Location:  Occupational psychologist of Service:  Clinic (12)  Provider: Sharon Stapel L. Mariea Clonts, D.O., C.M.D.  Goals of Care:  Advanced Directives 08/28/2018  Does Patient Have a Medical Advance Directive? Yes  Type of Advance Directive [place]  Does patient want to make changes to medical advance directive? No - Patient declined  Copy of [place] in Chart? Yes - validated most recent copy scanned in chart (See row information)  Would patient like information on creating a medical advance directive? -   Chief Complaint  Patient presents with  . Medical Management of Chronic Issues    45mth follow-up    HPI: Patient is a 85 y.o. male seen today for medical management of chronic diseases.  He has only seen me once before this.  He is fairly new to [facility].    He continues to be very wobbly.  He keeps falling.  He almost fell today in his room and grabbed staff member's arm.   When he's in bed, if he puts his head back, he's sensitive in his left side of his neck and the room goes around when he first lies down.  He also gets dizzy on standing at times if he does not steady himself--he then goes backward and sits into his chair.  He had some short-term therapy when he first arrived.    He's wringing his hands b/c they are sore.  He has not used any tylenol today.  He has no other pain right now.  He has some bruises on his left hand.  His wife think maybe he bumped the hand when he fell last Sunday over a box.  He puts aspercreme on his hands.  It helps.    At first when he got here, he slept really well.  He's a little restless at first but then sleeps well.  He thinks he might be trying to sleep too much.  Says he's taken two naps already.    He eats ok.  He doesn't seem to be sure what he orders.    He admits his memory is going about like his age--not as good as it was.  He has to think a long time to come up with answers to his  wife's questions.    He says he has a tormenting visitor when it comes to the bathroom.  He's had two small BMs.   The problem is that they are very loose at times.  He says other times they are blue ribbons which require little clean up.  The bms happen within 45 mins of a meal.  He has an urge.  He does have some incontinence when it's loose.  He had been overeating spinach causing loose stool.  His wife helped him choose other foods.    His wife points out he does not ask for help that he needs.  She feels like he needs to be checked on more often.  She stayed with him 9 hrs and he was not checked on.    Past Medical History:  Diagnosis Date  . Abnormal gait   . Adenocarcinoma of prostate ([place])   . Anxiety   . Arthritis    hands  . Balance problem   . Benzodiazepine dependence ([place])   . Blood transfusion without reported diagnosis    pt thinks had blood trnsfusion at prostate ca surgery   . Blurred vision   . Carotid atherosclerosis   .  Cataract   . Depression, major   . Glaucoma   . Hearing loss   . High cholesterol   . Hypertension   . Knee pain, bilateral   . Lacunar infarction ([place])   . Lumbar radiculopathy   . Ophthalmic herpes zoster   . Prostate cancer ([place])   . Sacroiliitis ([place])   . Vertigo     Past Surgical History:  Procedure Laterality Date  . COLONOSCOPY    . HERNIA REPAIR    . PROSTATECTOMY      Allergies  Allergen Reactions  . Rofecoxib Swelling    REACTION: swelling- Vioxx name brand     Outpatient Encounter Medications as of 10/23/2018  Medication Sig  . acetaminophen (TYLENOL) 325 MG tablet Take 1-2 tablets (325-650 mg total) by mouth every 4 (four) hours as needed for mild pain.  Marland Kitchen ALPRAZolam (XANAX) 0.25 MG tablet Take 1 tablet (0.25 mg total) by mouth 2 (two) times daily as needed for anxiety or sleep.  Marland Kitchen atorvastatin (LIPITOR) 20 MG tablet Take 1 tablet (20 mg total) by mouth daily at 6 PM.  . citalopram (CELEXA) 20 MG tablet Take 1 tablet  (20 mg total) by mouth daily.  . clopidogrel (PLAVIX) 75 MG tablet Take 1 tablet (75 mg total) by mouth daily.  . irbesartan (AVAPRO) 150 MG tablet Take 1 tablet by mouth daily.  Marland Kitchen latanoprost (XALATAN) 0.005 % ophthalmic solution Place 1 drop into the right eye at bedtime.   . lidocaine (LIDODERM) 5 % Place 1 patch onto the skin daily. Remove & Discard patch within 12 hours or as directed by MD  . polyethylene glycol (MIRALAX / GLYCOLAX) packet Take 17 g by mouth daily as needed for mild constipation.  . Probiotic Product (ALIGN PO) Take 1 capsule by mouth daily.  . Wheat Dextrin (BENEFIBER DRINK MIX) PACK Take 2 Scoops by mouth as needed.   No facility-administered encounter medications on file as of 10/23/2018.     Review of Systems:  Review of Systems  Constitutional: Negative for chills and fever.       Up 13 lbs since move in april  HENT: Positive for hearing loss. Negative for congestion.   Respiratory: Positive for shortness of breath. Negative for cough.   Cardiovascular: Negative for chest pain, palpitations, orthopnea and PND.  Gastrointestinal: Positive for diarrhea. Negative for abdominal pain, blood in stool, constipation and melena.       Frequent bms after meals  Genitourinary: Positive for urgency. Negative for dysuria.  Musculoskeletal: Positive for falls and joint pain.  Skin: Negative for rash.  Neurological: Positive for dizziness. Negative for focal weakness and loss of consciousness.  Endo/Heme/Allergies: Bruises/bleeds easily.  Psychiatric/Behavioral: Positive for memory loss. Negative for depression. The patient is nervous/anxious.     Health Maintenance  Topic Date Due  . TETANUS/TDAP  11/14/2017  . INFLUENZA VACCINE  10/19/2018  . PNA vac Low Risk Adult  Completed    Physical Exam: Vitals:   10/23/18 1333  BP: (!) 160/90  Pulse: 94  Temp: 97.9 F (36.6 C)  TempSrc: Oral  SpO2: 97%  Weight: 186 lb (84.4 kg)  Height: 6' (1.829 m)   Body mass  index is 25.23 kg/m. Physical Exam Vitals signs reviewed.  Constitutional:      General: He is not in acute distress.    Appearance: He is normal weight. He is not toxic-appearing.  HENT:     Head: Normocephalic and atraumatic.     Ears:  Comments: HOH; hearing aids Eyes:     Extraocular Movements: Extraocular movements intact.     Pupils: Pupils are equal, round, and reactive to light.  Cardiovascular:     Rate and Rhythm: Normal rate and regular rhythm.     Pulses: Normal pulses.     Heart sounds: Normal heart sounds.  Pulmonary:     Effort: Pulmonary effort is normal.     Breath sounds: Normal breath sounds. No rales.  Abdominal:     General: Bowel sounds are normal. There is no distension.     Palpations: Abdomen is soft. There is no mass.     Tenderness: There is no abdominal tenderness.  Musculoskeletal: Normal range of motion.     Right lower leg: Edema present.     Left lower leg: Edema present.     Comments: Edema controlled now with compression hose  Skin:    General: Skin is warm and dry.     Capillary Refill: Capillary refill takes less than 2 seconds.  Neurological:     General: No focal deficit present.     Mental Status: He is alert and oriented to person, place, and time. Mental status is at baseline.     Gait: Gait abnormal.     Comments: Came in scooter today; repeats himself some and looks to his wife at times to clarify things he's trying to say  Psychiatric:        Mood and Affect: Mood normal.     Labs reviewed: Basic Metabolic Panel: Recent Labs    04/06/18 1806 04/07/18 0715 04/07/18 0900 04/11/18 0541 04/18/18 0528 08/07/18 0600 08/14/18 0600 08/23/18  NA  --  139  --  138 141 141 139 139  K  --  4.1  --  3.8 4.0 4.6 4.5 4.0  CL  --  105  --  106 108  --   --   --   CO2  --  23  --  24 28  --   --   --   GLUCOSE  --  91  --  101* 97  --   --   --   BUN  --  13  --  11 19 17 19 19   CREATININE 1.01 0.95  --  0.92 0.92 0.8 1.1 1.0   CALCIUM  --  10.2  --  9.7 9.3  --   --   --   MG  --   --  2.2  --   --   --   --   --   PHOS  --   --  2.8  --   --   --   --   --   TSH 1.649  --   --   --   --   --   --   --    Liver Function Tests: Recent Labs    04/06/18 0840 04/07/18 0715 04/11/18 0541  AST 22 25 19   ALT 22 21 21   ALKPHOS 53 61 51  BILITOT 1.2 1.1 0.9  PROT 6.7 7.1 5.8*  ALBUMIN 4.1 3.9 3.4*   No results for input(s): LIPASE, AMYLASE in the last 8760 hours. No results for input(s): AMMONIA in the last 8760 hours. CBC: Recent Labs    04/06/18 0840 04/06/18 1806 04/07/18 0715 04/11/18 0541 08/07/18 0600  WBC 6.8 7.8 6.9 7.2 6.7  NEUTROABS 4.2  --   --  4.5  --   HGB 14.4 15.3 15.8  14.4 14.6  HCT 45.4 47.3 49.2 43.9 43  MCV 90.3 85.8 85.9 84.4  --   PLT 193 185 179 202 206   Lipid Panel: Recent Labs    04/07/18 0715  CHOL 160  HDL 47  LDLCALC 89  TRIG 122  CHOLHDL 3.4   Lab Results  Component Value Date   HGBA1C 5.5 04/06/2018    Procedures since last visit: No results found.  Assessment/Plan 1. Frequent falls -appears he needs more physical therapy -resume regular hydration with water which I suspect has gone by the wayside again after a few weeks of nursing reminding him to drink water  2. Positional lightheadedness -causing #1   3. Cerebellar dysfunction -also contributing to #1  -balance poor -will ask PT to reassess him  4. Cognitive impairment -also progressing -will be interesting to see next MMSE on his AWV -sounds like he's needing more prompting and becoming less safe in the regular AL environment  5. Functional diarrhea -need to actually use the fiber supplement daily and hydrate to help solve this issue--has prn but it's not being given per he and his wife -also he does not always report problems and tries to take care of cleaning himself up without notifying staff of his incontinence  Labs/tests ordered:   PT, fiber supplement daily, push fluids  Next  appt:  03/05/2019  Torion Hulgan L. Chane Cowden, D.O. [facility] [address], [place] [address] Cell Phone (Mon-Fri 8am-5pm):  [phone] On Call:  [phone] & follow prompts after 5pm & weekends Office Phone:  [phone] Office Fax:  [phone]

## 2018-11-21 DIAGNOSIS — R293 Abnormal posture: Secondary | ICD-10-CM | POA: Diagnosis not present

## 2018-11-21 DIAGNOSIS — I6381 Other cerebral infarction due to occlusion or stenosis of small artery: Secondary | ICD-10-CM | POA: Diagnosis not present

## 2018-11-21 DIAGNOSIS — R278 Other lack of coordination: Secondary | ICD-10-CM | POA: Diagnosis not present

## 2018-11-21 DIAGNOSIS — M6389 Disorders of muscle in diseases classified elsewhere, multiple sites: Secondary | ICD-10-CM | POA: Diagnosis not present

## 2018-11-25 DIAGNOSIS — M6389 Disorders of muscle in diseases classified elsewhere, multiple sites: Secondary | ICD-10-CM | POA: Diagnosis not present

## 2018-11-25 DIAGNOSIS — R293 Abnormal posture: Secondary | ICD-10-CM | POA: Diagnosis not present

## 2018-11-25 DIAGNOSIS — I6381 Other cerebral infarction due to occlusion or stenosis of small artery: Secondary | ICD-10-CM | POA: Diagnosis not present

## 2018-11-25 DIAGNOSIS — R278 Other lack of coordination: Secondary | ICD-10-CM | POA: Diagnosis not present

## 2018-11-26 DIAGNOSIS — B351 Tinea unguium: Secondary | ICD-10-CM | POA: Diagnosis not present

## 2018-11-26 DIAGNOSIS — M79671 Pain in right foot: Secondary | ICD-10-CM | POA: Diagnosis not present

## 2018-11-26 DIAGNOSIS — M79672 Pain in left foot: Secondary | ICD-10-CM | POA: Diagnosis not present

## 2018-11-27 DIAGNOSIS — R293 Abnormal posture: Secondary | ICD-10-CM | POA: Diagnosis not present

## 2018-11-27 DIAGNOSIS — I6381 Other cerebral infarction due to occlusion or stenosis of small artery: Secondary | ICD-10-CM | POA: Diagnosis not present

## 2018-11-27 DIAGNOSIS — M6389 Disorders of muscle in diseases classified elsewhere, multiple sites: Secondary | ICD-10-CM | POA: Diagnosis not present

## 2018-11-27 DIAGNOSIS — R278 Other lack of coordination: Secondary | ICD-10-CM | POA: Diagnosis not present

## 2018-12-03 DIAGNOSIS — R293 Abnormal posture: Secondary | ICD-10-CM | POA: Diagnosis not present

## 2018-12-03 DIAGNOSIS — R278 Other lack of coordination: Secondary | ICD-10-CM | POA: Diagnosis not present

## 2018-12-03 DIAGNOSIS — M6389 Disorders of muscle in diseases classified elsewhere, multiple sites: Secondary | ICD-10-CM | POA: Diagnosis not present

## 2018-12-03 DIAGNOSIS — I6381 Other cerebral infarction due to occlusion or stenosis of small artery: Secondary | ICD-10-CM | POA: Diagnosis not present

## 2018-12-04 DIAGNOSIS — R278 Other lack of coordination: Secondary | ICD-10-CM | POA: Diagnosis not present

## 2018-12-04 DIAGNOSIS — I6381 Other cerebral infarction due to occlusion or stenosis of small artery: Secondary | ICD-10-CM | POA: Diagnosis not present

## 2018-12-04 DIAGNOSIS — M6389 Disorders of muscle in diseases classified elsewhere, multiple sites: Secondary | ICD-10-CM | POA: Diagnosis not present

## 2018-12-04 DIAGNOSIS — R293 Abnormal posture: Secondary | ICD-10-CM | POA: Diagnosis not present

## 2018-12-09 DIAGNOSIS — R293 Abnormal posture: Secondary | ICD-10-CM | POA: Diagnosis not present

## 2018-12-09 DIAGNOSIS — M6389 Disorders of muscle in diseases classified elsewhere, multiple sites: Secondary | ICD-10-CM | POA: Diagnosis not present

## 2018-12-09 DIAGNOSIS — I6381 Other cerebral infarction due to occlusion or stenosis of small artery: Secondary | ICD-10-CM | POA: Diagnosis not present

## 2018-12-09 DIAGNOSIS — R278 Other lack of coordination: Secondary | ICD-10-CM | POA: Diagnosis not present

## 2018-12-11 DIAGNOSIS — R293 Abnormal posture: Secondary | ICD-10-CM | POA: Diagnosis not present

## 2018-12-11 DIAGNOSIS — I6381 Other cerebral infarction due to occlusion or stenosis of small artery: Secondary | ICD-10-CM | POA: Diagnosis not present

## 2018-12-11 DIAGNOSIS — M6389 Disorders of muscle in diseases classified elsewhere, multiple sites: Secondary | ICD-10-CM | POA: Diagnosis not present

## 2018-12-11 DIAGNOSIS — R278 Other lack of coordination: Secondary | ICD-10-CM | POA: Diagnosis not present

## 2018-12-14 DIAGNOSIS — Z86010 Personal history of colonic polyps: Secondary | ICD-10-CM | POA: Diagnosis not present

## 2018-12-14 DIAGNOSIS — A0472 Enterocolitis due to Clostridium difficile, not specified as recurrent: Secondary | ICD-10-CM | POA: Diagnosis not present

## 2018-12-14 DIAGNOSIS — R197 Diarrhea, unspecified: Secondary | ICD-10-CM | POA: Diagnosis not present

## 2018-12-16 DIAGNOSIS — I6381 Other cerebral infarction due to occlusion or stenosis of small artery: Secondary | ICD-10-CM | POA: Diagnosis not present

## 2018-12-16 DIAGNOSIS — R278 Other lack of coordination: Secondary | ICD-10-CM | POA: Diagnosis not present

## 2018-12-16 DIAGNOSIS — M6389 Disorders of muscle in diseases classified elsewhere, multiple sites: Secondary | ICD-10-CM | POA: Diagnosis not present

## 2018-12-16 DIAGNOSIS — R293 Abnormal posture: Secondary | ICD-10-CM | POA: Diagnosis not present

## 2018-12-18 DIAGNOSIS — R293 Abnormal posture: Secondary | ICD-10-CM | POA: Diagnosis not present

## 2018-12-18 DIAGNOSIS — I6381 Other cerebral infarction due to occlusion or stenosis of small artery: Secondary | ICD-10-CM | POA: Diagnosis not present

## 2018-12-18 DIAGNOSIS — R278 Other lack of coordination: Secondary | ICD-10-CM | POA: Diagnosis not present

## 2018-12-18 DIAGNOSIS — Z9189 Other specified personal risk factors, not elsewhere classified: Secondary | ICD-10-CM | POA: Diagnosis not present

## 2018-12-18 DIAGNOSIS — M6389 Disorders of muscle in diseases classified elsewhere, multiple sites: Secondary | ICD-10-CM | POA: Diagnosis not present

## 2018-12-18 LAB — NOVEL CORONAVIRUS, NAA: SARS-CoV-2, NAA: NOT DETECTED

## 2018-12-25 ENCOUNTER — Other Ambulatory Visit: Payer: Self-pay | Admitting: *Deleted

## 2018-12-26 DIAGNOSIS — Z20828 Contact with and (suspected) exposure to other viral communicable diseases: Secondary | ICD-10-CM | POA: Diagnosis not present

## 2018-12-27 LAB — NOVEL CORONAVIRUS, NAA: SARS-CoV-2, NAA: NOT DETECTED

## 2018-12-30 DIAGNOSIS — R293 Abnormal posture: Secondary | ICD-10-CM | POA: Diagnosis not present

## 2018-12-30 DIAGNOSIS — M6389 Disorders of muscle in diseases classified elsewhere, multiple sites: Secondary | ICD-10-CM | POA: Diagnosis not present

## 2018-12-30 DIAGNOSIS — R278 Other lack of coordination: Secondary | ICD-10-CM | POA: Diagnosis not present

## 2018-12-30 DIAGNOSIS — I6381 Other cerebral infarction due to occlusion or stenosis of small artery: Secondary | ICD-10-CM | POA: Diagnosis not present

## 2019-01-07 DIAGNOSIS — Z9189 Other specified personal risk factors, not elsewhere classified: Secondary | ICD-10-CM | POA: Diagnosis not present

## 2019-01-22 ENCOUNTER — Non-Acute Institutional Stay: Payer: Medicare Other | Admitting: Internal Medicine

## 2019-01-22 ENCOUNTER — Encounter: Payer: Self-pay | Admitting: Internal Medicine

## 2019-01-22 ENCOUNTER — Other Ambulatory Visit: Payer: Self-pay

## 2019-01-22 VITALS — BP 148/90 | HR 96 | Temp 98.8°F | Ht 72.0 in | Wt 187.0 lb

## 2019-01-22 DIAGNOSIS — I6381 Other cerebral infarction due to occlusion or stenosis of small artery: Secondary | ICD-10-CM

## 2019-01-22 DIAGNOSIS — R296 Repeated falls: Secondary | ICD-10-CM

## 2019-01-22 DIAGNOSIS — Z23 Encounter for immunization: Secondary | ICD-10-CM

## 2019-01-22 DIAGNOSIS — H6123 Impacted cerumen, bilateral: Secondary | ICD-10-CM

## 2019-01-22 NOTE — Progress Notes (Signed)
Location:  [facility] clinic Provider:  Jovin Fester L. Mariea Clonts, D.O., C.M.D.  Code Status: FULL CODE Goals of Care:  Advanced Directives 08/28/2018  Does Patient Have a Medical Advance Directive? Yes  Type of Advance Directive [place]  Does patient want to make changes to medical advance directive? No - Patient declined  Copy of [place] in Chart? Yes - validated most recent copy scanned in chart (See row information)  Would patient like information on creating a medical advance directive? -     Chief Complaint  Patient presents with  . Acute Visit    Bilateral decreased hearing   . Immunizations    Discuss need for TD/TDap     HPI: Patient is a 85 y.o. male seen today for acute visit due to bilateral decreased hearing.  He reports immediately that he cannot hear a thing.    Needs tdap.  Got flu shot 10/29 here at [facility].    Past Medical History:  Diagnosis Date  . Abnormal gait   . Adenocarcinoma of prostate ([place])   . Anxiety   . Arthritis    hands  . Balance problem   . Benzodiazepine dependence ([place])   . Blood transfusion without reported diagnosis    pt thinks had blood trnsfusion at prostate ca surgery   . Blurred vision   . Carotid atherosclerosis   . Cataract   . Depression, major   . Glaucoma   . Hearing loss   . High cholesterol   . Hypertension   . Knee pain, bilateral   . Lacunar infarction ([place])   . Lumbar radiculopathy   . Ophthalmic herpes zoster   . Prostate cancer ([place])   . Sacroiliitis ([place])   . Vertigo     Past Surgical History:  Procedure Laterality Date  . COLONOSCOPY    . HERNIA REPAIR    . PROSTATECTOMY      Allergies  Allergen Reactions  . Rofecoxib Swelling    REACTION: swelling- Vioxx name brand     Outpatient Encounter Medications as of 01/22/2019  Medication Sig  . acetaminophen (TYLENOL) 325 MG tablet Take 650 mg by mouth every 4 (four) hours as needed for mild pain.  Marland Kitchen ALPRAZolam  (XANAX) 0.25 MG tablet Take 1 tablet (0.25 mg total) by mouth 2 (two) times daily as needed for anxiety or sleep.  Marland Kitchen atorvastatin (LIPITOR) 20 MG tablet Take 1 tablet (20 mg total) by mouth daily at 6 PM.  . calcium carbonate (TUMS - DOSED IN MG ELEMENTAL CALCIUM) 500 MG chewable tablet Chew 2 tablets by mouth as needed for indigestion.  . cholestyramine (QUESTRAN) 4 g packet Take 4 g by mouth every morning.  . citalopram (CELEXA) 20 MG tablet Take 1 tablet (20 mg total) by mouth daily.  . clopidogrel (PLAVIX) 75 MG tablet Take 1 tablet (75 mg total) by mouth daily.  . irbesartan (AVAPRO) 150 MG tablet Take 1 tablet by mouth daily.  Marland Kitchen latanoprost (XALATAN) 0.005 % ophthalmic solution Place 1 drop into the right eye at bedtime.   . lidocaine (LIDODERM) 5 % Place 1 patch onto the skin daily. Remove & Discard patch within 12 hours or as directed by MD  . polyethylene glycol (MIRALAX / GLYCOLAX) packet Take 17 g by mouth daily as needed for mild constipation.  . Probiotic Product (ALIGN PO) Take 1 capsule by mouth daily.  . [DISCONTINUED] acetaminophen (TYLENOL) 325 MG tablet Take 1-2 tablets (325-650 mg total) by mouth  every 4 (four) hours as needed for mild pain. (Patient not taking: Reported on 01/22/2019)  . [DISCONTINUED] Wheat Dextrin (BENEFIBER DRINK MIX) PACK Take 2 Scoops by mouth as needed.   No facility-administered encounter medications on file as of 01/22/2019.     Review of Systems:  Review of Systems  Constitutional: Negative for chills and fever.  HENT: Positive for hearing loss. Negative for ear discharge and ear pain.   Eyes: Negative for blurred vision.  Respiratory: Negative for shortness of breath.   Cardiovascular: Negative for chest pain.  Gastrointestinal: Positive for constipation and diarrhea.  Genitourinary: Negative for dysuria.  Musculoskeletal: Positive for falls.  Neurological: Positive for dizziness.  Psychiatric/Behavioral: Positive for memory loss.    Health  Maintenance  Topic Date Due  . TETANUS/TDAP  11/14/2017  . INFLUENZA VACCINE  10/19/2018  . PNA vac Low Risk Adult  Completed    Physical Exam: Vitals:   01/22/19 1117  BP: (!) 148/90  Pulse: 96  Temp: 98.8 F (37.1 C)  TempSrc: Oral  SpO2: 97%  Weight: 187 lb (84.8 kg)  Height: 6' (1.829 m)   Body mass index is 25.36 kg/m. Physical Exam Constitutional:      Appearance: Normal appearance.     Comments: Came in power scooter  HENT:     Head: Normocephalic and atraumatic.     Right Ear: There is impacted cerumen.     Left Ear: There is impacted cerumen.     Ears:     Comments: Dark moist brown cerumen obstructing both ear canals and on hearing aids; pt unable to understand me with masking and this wax Cardiovascular:     Rate and Rhythm: Normal rate and regular rhythm.  Pulmonary:     Effort: Pulmonary effort is normal.  Musculoskeletal: Normal range of motion.     Comments: Seated in power scooter  Skin:    General: Skin is warm and dry.  Neurological:     Mental Status: He is alert. Mental status is at baseline.  Psychiatric:        Mood and Affect: Mood normal.     Labs reviewed: Basic Metabolic Panel: Recent Labs    04/06/18 1806 04/07/18 0715 04/07/18 0900 04/11/18 0541 04/18/18 0528 08/07/18 0600 08/14/18 0600 08/23/18  NA  --  139  --  138 141 141 139 139  K  --  4.1  --  3.8 4.0 4.6 4.5 4.0  CL  --  105  --  106 108  --   --   --   CO2  --  23  --  24 28  --   --   --   GLUCOSE  --  91  --  101* 97  --   --   --   BUN  --  13  --  11 19 17 19 19   CREATININE 1.01 0.95  --  0.92 0.92 0.8 1.1 1.0  CALCIUM  --  10.2  --  9.7 9.3  --   --   --   MG  --   --  2.2  --   --   --   --   --   PHOS  --   --  2.8  --   --   --   --   --   TSH 1.649  --   --   --   --   --   --   --    Liver  Function Tests: Recent Labs    04/06/18 0840 04/07/18 0715 04/11/18 0541  AST 22 25 19   ALT 22 21 21   ALKPHOS 53 61 51  BILITOT 1.2 1.1 0.9  PROT 6.7 7.1  5.8*  ALBUMIN 4.1 3.9 3.4*   No results for input(s): LIPASE, AMYLASE in the last 8760 hours. No results for input(s): AMMONIA in the last 8760 hours. CBC: Recent Labs    04/06/18 0840 04/06/18 1806 04/07/18 0715 04/11/18 0541 08/07/18 0600  WBC 6.8 7.8 6.9 7.2 6.7  NEUTROABS 4.2  --   --  4.5  --   HGB 14.4 15.3 15.8 14.4 14.6  HCT 45.4 47.3 49.2 43.9 43  MCV 90.3 85.8 85.9 84.4  --   PLT 193 185 179 202 206   Lipid Panel: Recent Labs    04/07/18 0715  CHOL 160  HDL 47  LDLCALC 89  TRIG 122  CHOLHDL 3.4   Lab Results  Component Value Date   HGBA1C 5.5 04/06/2018    Assessment/Plan 1. Hearing loss due to cerumen impaction, bilateral -ears were flushed with warm water and peroxide--right completely cleared and left required instrumentation to remove cerumen--I used a white plastic curette to remove a large chunk of remaining cerumen - he continued to struggle with hearing but we were able to speak after the flushing -audiology f/u recommended for hearing aid tweaking  2. Need for Tdap vaccination -tdap ordered to be given by nursing when it comes in from [place]  3. Frequent falls -had another fall 10/17 -he neglects to hydrate well which affects his orthostasis and he often tries to change positions w/o his walker and changes positions too quickly -unfortunately, he does not recall these pieces of advice well day to day due to cognitive impairment  Labs/tests ordered:  [facility] to [facility] by [facility] nursing staff  Next appt:  03/05/2019--keep regular visit as scheduled  Makenlee Mckeag L. Rukiya Hodgkins, D.O. [facility] [address], [place] [address] Cell Phone (Mon-Fri 8am-5pm):  [phone] On Call:  [phone] & follow prompts after 5pm & weekends Office Phone:  [phone] Office Fax:  [phone]

## 2019-01-23 ENCOUNTER — Encounter: Payer: Self-pay | Admitting: Internal Medicine

## 2019-01-29 DIAGNOSIS — Z9189 Other specified personal risk factors, not elsewhere classified: Secondary | ICD-10-CM | POA: Diagnosis not present

## 2019-02-02 ENCOUNTER — Encounter: Payer: Self-pay | Admitting: Internal Medicine

## 2019-02-03 ENCOUNTER — Other Ambulatory Visit: Payer: Self-pay | Admitting: Adult Health

## 2019-02-03 MED ORDER — ALPRAZOLAM 0.25 MG PO TABS: 0.2500 mg | ORAL_TABLET | Freq: Two times a day (BID) | ORAL | 2 refills | Status: DC | PRN

## 2019-02-11 DIAGNOSIS — B351 Tinea unguium: Secondary | ICD-10-CM | POA: Diagnosis not present

## 2019-02-11 DIAGNOSIS — M79672 Pain in left foot: Secondary | ICD-10-CM | POA: Diagnosis not present

## 2019-02-11 DIAGNOSIS — M79671 Pain in right foot: Secondary | ICD-10-CM | POA: Diagnosis not present

## 2019-02-12 DIAGNOSIS — Z20828 Contact with and (suspected) exposure to other viral communicable diseases: Secondary | ICD-10-CM | POA: Diagnosis not present

## 2019-02-12 DIAGNOSIS — Z9189 Other specified personal risk factors, not elsewhere classified: Secondary | ICD-10-CM | POA: Diagnosis not present

## 2019-03-03 ENCOUNTER — Other Ambulatory Visit: Payer: Self-pay | Admitting: Adult Health

## 2019-03-03 MED ORDER — ALPRAZOLAM 0.25 MG PO TABS: 0.2500 mg | ORAL_TABLET | Freq: Two times a day (BID) | ORAL | 2 refills | Status: DC | PRN

## 2019-03-05 ENCOUNTER — Encounter: Payer: Self-pay | Admitting: Internal Medicine

## 2019-03-05 ENCOUNTER — Non-Acute Institutional Stay: Payer: Medicare Other | Admitting: Internal Medicine

## 2019-03-05 ENCOUNTER — Other Ambulatory Visit: Payer: Self-pay

## 2019-03-05 VITALS — BP 136/77 | HR 80 | Temp 98.6°F | Resp 20 | Ht 72.0 in | Wt 185.0 lb

## 2019-03-05 DIAGNOSIS — G479 Sleep disorder, unspecified: Secondary | ICD-10-CM

## 2019-03-05 DIAGNOSIS — R3981 Functional urinary incontinence: Secondary | ICD-10-CM

## 2019-03-05 DIAGNOSIS — R152 Fecal urgency: Secondary | ICD-10-CM

## 2019-03-05 DIAGNOSIS — R159 Full incontinence of feces: Secondary | ICD-10-CM | POA: Diagnosis not present

## 2019-03-05 DIAGNOSIS — G9340 Encephalopathy, unspecified: Secondary | ICD-10-CM | POA: Diagnosis not present

## 2019-03-05 DIAGNOSIS — I6381 Other cerebral infarction due to occlusion or stenosis of small artery: Secondary | ICD-10-CM

## 2019-03-05 DIAGNOSIS — R296 Repeated falls: Secondary | ICD-10-CM

## 2019-03-05 DIAGNOSIS — F0150 Vascular dementia without behavioral disturbance: Secondary | ICD-10-CM | POA: Diagnosis not present

## 2019-03-05 NOTE — Progress Notes (Signed)
Location:  [facility] Service:  Clinic (12)  Provider: Demarrion Meiklejohn L. Mariea Clonts, D.O., C.M.D.  Code Status: FULL CODE Goals of Care:  Advanced Directives 08/28/2018  Does Patient Have a Medical Advance Directive? Yes  Type of Advance Directive [place]  Does patient want to make changes to medical advance directive? No - Patient declined  Copy of [place] in Chart? Yes - validated most recent copy scanned in chart (See row information)  Would patient like information on creating a medical advance directive? -     Chief Complaint  Patient presents with  . Medical Management of Chronic Issues    60mth follow-up    HPI: Patient is a 85 y.o. male seen today for medical management of chronic diseases.    Pt seen and examined.  He spent much of his visit talking about his bowel movements.  He has had difficulty with fecal incontinence at times.  His stools tend to be mustard yellow now Lucrezia Starch) and "come on their own schedule".  He described them as soft and "puffy" rather than formed, but this is not all of the time, it's sporadic.  Many times he makes it to the restroom.  In fact, when I asked nursing about it, they cannot remember when he last had incontinence of feces that they are aware of.  He does have urgency.  No pain.  No melena or hematochezia.  He also has benefiber added again to bulk stools b/c at first this was felt not to help, but when it was stopped, stools became softer again.  He seems to obsess a bit over this.  He and his wife apparently discuss his "shooshoo" on the phone regularly since they live on separate floors of AL b/c she's in enhanced.  He, unfortunately, has early dementia and does not provide good history.  He tells her that he has these episodes and cleans all of it up himself.  She is very upset that we have not fixed his fecal incontinence which has been problematic since their move here after  his stroke.  I explained the neurologic component to it and she thinks that [facility] would be able to fix all of his problems and that he needs to see the neurologist that he saw in the hospital when he had his stroke.  Of course, we know he has some dementia, vascular in nature s/p CVA.  Of note, he also wears incontinence briefs and 2 guards for urinary incontinence, but says it's not a problem and he does not have incontinence.    He sleeps ok, but reports not being able to do w/o his "1 weak xanax" though at times he's apparently fallen asleep w/o it.  Appetite is fine, but he does not like the food here which he talked about for a considerable time.    He has no pain complaints.   He still says he needs his eyes reexamined--I'd previously referred him and not sure if that got postponed due to covid or if he went and does not recall.    Hearing is still hard, but considerably better after cerumen impaction was addressed last month.    Past Medical History:  Diagnosis Date  . Abnormal gait   . Adenocarcinoma of prostate ([place])   . Anxiety   . Arthritis    hands  . Balance problem   . Benzodiazepine dependence ([place])   .  Blood transfusion without reported diagnosis    pt thinks had blood trnsfusion at prostate ca surgery   . Blurred vision   . Carotid atherosclerosis   . Cataract   . Depression, major   . Glaucoma   . Hearing loss   . High cholesterol   . Hypertension   . Knee pain, bilateral   . Lacunar infarction ([place])   . Lumbar radiculopathy   . Ophthalmic herpes zoster   . Prostate cancer ([place])   . Sacroiliitis ([place])   . Vertigo     Past Surgical History:  Procedure Laterality Date  . COLONOSCOPY    . HERNIA REPAIR    . PROSTATECTOMY      Allergies  Allergen Reactions  . Rofecoxib Swelling    REACTION: swelling- Vioxx name brand     Outpatient Encounter Medications as of 03/05/2019  Medication Sig  . acetaminophen (TYLENOL)  325 MG tablet Take 650 mg by mouth every 4 (four) hours as needed for mild pain.  Marland Kitchen ALPRAZolam (XANAX) 0.25 MG tablet Take 1 tablet (0.25 mg total) by mouth 2 (two) times daily as needed for anxiety or sleep.  Marland Kitchen atorvastatin (LIPITOR) 20 MG tablet Take 1 tablet (20 mg total) by mouth daily at 6 PM.  . calcium carbonate (TUMS - DOSED IN MG ELEMENTAL CALCIUM) 500 MG chewable tablet Chew 2 tablets by mouth as needed for indigestion.  . cholestyramine (QUESTRAN) 4 g packet Take 4 g by mouth every morning.  . citalopram (CELEXA) 20 MG tablet Take 1 tablet (20 mg total) by mouth daily.  . clopidogrel (PLAVIX) 75 MG tablet Take 1 tablet (75 mg total) by mouth daily.  . irbesartan (AVAPRO) 150 MG tablet Take 1 tablet by mouth daily.  Marland Kitchen latanoprost (XALATAN) 0.005 % ophthalmic solution Place 1 drop into the right eye at bedtime.   . lidocaine (LIDODERM) 5 % Place 1 patch onto the skin daily. Remove & Discard patch within 12 hours or as directed by MD  . polyethylene glycol (MIRALAX / GLYCOLAX) packet Take 17 g by mouth daily as needed for mild constipation.  . Probiotic Product (ALIGN PO) Take 1 capsule by mouth daily.  . Wheat Dextrin (BENEFIBER) POWD Take 2 Scoops by mouth daily.   No facility-administered encounter medications on file as of 03/05/2019.    Review of Systems:  Review of Systems  Constitutional: Negative for chills, fever and malaise/fatigue.  HENT: Positive for hearing loss. Negative for congestion and sore throat.   Eyes: Positive for blurred vision.  Respiratory: Negative for cough and shortness of breath.   Cardiovascular: Negative for chest pain, palpitations and leg swelling.  Gastrointestinal: Negative for abdominal pain, blood in stool, constipation, diarrhea and melena.       Fecal incontinence (see hpi)  Genitourinary: Negative for dysuria.       Incontinence  Musculoskeletal: Positive for falls. Negative for back pain and joint pain.  Skin: Negative for itching and  rash.  Neurological: Negative for dizziness and loss of consciousness.  Endo/Heme/Allergies: Bruises/bleeds easily.  Psychiatric/Behavioral: Positive for memory loss. Negative for depression. The patient has insomnia. The patient is not nervous/anxious.     Health Maintenance  Topic Date Due  . TETANUS/TDAP  01/21/2029  . INFLUENZA VACCINE  Completed  . PNA vac Low Risk Adult  Completed    Physical Exam: Vitals:   03/05/19 1332  BP: 136/77  Pulse: 80  Resp: 20  Temp: 98.6 F (37 C)  TempSrc: Oral  SpO2:  95%  Weight: 185 lb (83.9 kg)  Height: 6' (1.829 m)   Body mass index is 25.09 kg/m. Physical Exam Vitals and nursing note reviewed.  Constitutional:      Appearance: Normal appearance.  HENT:     Head: Normocephalic and atraumatic.     Ears:     Comments: HOH with hearing aids but much better than before ears flushed last visit Eyes:     Extraocular Movements: Extraocular movements intact.     Pupils: Pupils are equal, round, and reactive to light.  Cardiovascular:     Rate and Rhythm: Normal rate and regular rhythm.     Pulses: Normal pulses.     Heart sounds: Normal heart sounds.  Pulmonary:     Effort: Pulmonary effort is normal.     Breath sounds: Normal breath sounds.  Abdominal:     General: Bowel sounds are normal. There is no distension.     Palpations: Abdomen is soft. There is no mass.     Tenderness: There is no abdominal tenderness. There is no guarding or rebound.  Musculoskeletal:        General: Normal range of motion.     Right lower leg: No edema.     Left lower leg: No edema.  Skin:    General: Skin is warm and dry.     Coloration: Skin is pale.  Neurological:     Mental Status: He is alert and oriented to person, place, and time.     Coordination: Coordination abnormal.     Gait: Gait abnormal (uses power chair).     Comments: Oriented, but poor historian  Psychiatric:        Mood and Affect: Mood normal.     Labs reviewed: Basic  Metabolic Panel: Recent Labs    04/06/18 0840 04/06/18 1806 04/07/18 0715 04/07/18 0900 04/11/18 0541 04/18/18 0528 08/07/18 0600 08/14/18 0600 08/23/18 0000  NA  --   --  139  --  138 141 141 139 139  K  --   --  4.1  --  3.8 4.0 4.6 4.5 4.0  CL  --   --  105  --  106 108  --   --   --   CO2  --   --  23  --  24 28  --   --   --   GLUCOSE  --   --  91  --  101* 97  --   --   --   BUN  --   --  13  --  11 19 17 19 19   CREATININE   < > 1.01 0.95  --  0.92 0.92 0.8 1.1 1.0  CALCIUM  --   --  10.2  --  9.7 9.3  --   --   --   MG  --   --   --  2.2  --   --   --   --   --   PHOS  --   --   --  2.8  --   --   --   --   --   TSH  --  1.649  --   --   --   --   --   --   --    < > = values in this interval not displayed.   Liver Function Tests: Recent Labs    04/06/18 0840 04/07/18 0715 04/11/18 0541  AST 22 25 19  ALT 22 21 21   ALKPHOS 53 61 51  BILITOT 1.2 1.1 0.9  PROT 6.7 7.1 5.8*  ALBUMIN 4.1 3.9 3.4*   No results for input(s): LIPASE, AMYLASE in the last 8760 hours. No results for input(s): AMMONIA in the last 8760 hours. CBC: Recent Labs    04/06/18 0840 04/06/18 1806 04/07/18 0715 04/11/18 0541 08/07/18 0600  WBC 6.8 7.8 6.9 7.2 6.7  NEUTROABS 4.2  --   --  4.5  --   HGB 14.4 15.3 15.8 14.4 14.6  HCT 45.4 47.3 49.2 43.9 43  MCV 90.3 85.8 85.9 84.4  --   PLT 193 185 179 202 206   Lipid Panel: Recent Labs    04/07/18 0715  CHOL 160  HDL 47  LDLCALC 89  TRIG 122  CHOLHDL 3.4   Lab Results  Component Value Date   HGBA1C 5.5 04/06/2018    Assessment/Plan 1. Incontinence of feces with fecal urgency -his biggest complaint and his wife's -we have made great progress controlling this by bulking his stools -ideally, he needs more assistance than he has due to his cognitive decline and falls -he uses incontinence products -he reports cleaning himself up from these episodes, but I cannot physically imagine how he'd do that -I did note that he had milk  setting out from his breakfast well after lunch and his wife was telling him to make hot chocolate so I was concerned he could be drinking sour milk--nursing was going to check on this situations -cont questran and benefiber which have helped according to nursing--incontinence has been rare and I suspect neurologic in origin from his stroke and somewhat functional b/c he gets urge and cannot physically get there in time  2. Cerebellar dysfunction -s/p stroke, tends to fall backwards if he gets up quickly or changes position quickly--hydrate, move slowly -has had numerous sessions with therapy -remains unsteady due to neurologic changes, using power chair device   3. Vascular dementia without behavioral disturbance ([place]) -seems he does have early dementia now--had been more of a MCI when he arrived, but he has little recall visit to visit and is poor historian about events -his wife still believes everything he tells her and not what staff observe  4. Sleep disturbance -reportedly "needs" his xanax, but has been known to be asleep when it's brought in, would favor weaning due to negative effects on both balance and cognition  5. Functional urinary incontinence -continue incontinence briefs and guards, good hygiene practices which he needs assistance with at this point with his unsteady gait and declining cognition  6. Frequent falls -related to his balance since his stroke -multiple nursing interventions in place and therapy modalities, but still occasionally falls b/c he does not remember  Labs/tests ordered:  No new Next appt:  07/09/2019   Nickayla Mcinnis L. Marius Betts, D.O. [facility] [address], [place] [address] Cell Phone (Mon-Fri 8am-5pm):  [phone] On Call:  [phone] & follow prompts after 5pm & weekends Office Phone:  [phone] Office Fax:  [phone]

## 2019-03-10 DIAGNOSIS — R152 Fecal urgency: Secondary | ICD-10-CM | POA: Insufficient documentation

## 2019-03-10 DIAGNOSIS — R159 Full incontinence of feces: Secondary | ICD-10-CM | POA: Insufficient documentation

## 2019-03-10 DIAGNOSIS — R296 Repeated falls: Secondary | ICD-10-CM | POA: Insufficient documentation

## 2019-03-10 DIAGNOSIS — R3981 Functional urinary incontinence: Secondary | ICD-10-CM | POA: Insufficient documentation

## 2019-03-10 DIAGNOSIS — F0150 Vascular dementia without behavioral disturbance: Secondary | ICD-10-CM | POA: Insufficient documentation

## 2019-03-10 DIAGNOSIS — G9340 Encephalopathy, unspecified: Secondary | ICD-10-CM | POA: Insufficient documentation

## 2019-03-18 ENCOUNTER — Non-Acute Institutional Stay: Payer: Medicare Other | Admitting: Adult Health

## 2019-03-18 ENCOUNTER — Encounter: Payer: Self-pay | Admitting: Adult Health

## 2019-03-18 DIAGNOSIS — Z0000 Encounter for general adult medical examination without abnormal findings: Secondary | ICD-10-CM

## 2019-03-18 NOTE — Progress Notes (Signed)
Subjective:   [name] Duffy is a 85 y.o. male who presents for Medicare Annual/Subsequent preventive examination, resides in assisted living at [facility].  Review of Systems:   Cardiac Risk Factors include: advanced age (>56men, >1 women);hypertension;male gender;dyslipidemia;sedentary lifestyle     Objective:    Vitals: Wt 185 lb 3.2 oz (84 kg)   BMI 25.12 kg/m   Body mass index is 25.12 kg/m.  Advanced Directives 03/18/2019 08/28/2018 07/16/2018 04/10/2018 04/06/2018 10/15/2017 10/09/2016  Does Patient Have a Medical Advance Directive? No;Yes Yes Yes No No No Yes  Type of Paramedic of [place];Living will Healthcare Power of [place] will  Does patient want to make changes to medical advance directive? No - Patient declined No - Patient declined No - Patient declined - - - -  Copy of [place] in Chart? Yes - validated most recent copy scanned in chart (See row information) Yes - validated most recent copy scanned in chart (See row information) Yes - validated most recent copy scanned in chart (See row information) - - - -  Would patient like information on creating a medical advance directive? No - Patient declined - - No - Patient declined No - Patient declined - -    Tobacco Social History   Tobacco Use  Smoking Status Never Smoker  Smokeless Tobacco Never Used     Counseling given: Not Answered   Clinical Intake:  Pre-visit preparation completed: No  Pain : No/denies pain     BMI - recorded: 25.12 Nutritional Status: BMI 25 -29 Overweight Diabetes: No  How often do you need to have someone help you when you read instructions, pamphlets, or other written materials from your doctor or pharmacy?: 3 - Sometimes What is the last grade level you completed in school?: high school  Interpreter Needed?: No  Information entered by :: Royal Hawthorn NP  Past  Medical History:  Diagnosis Date  . Abnormal gait   . Adenocarcinoma of prostate ([place])   . Anxiety   . Arthritis    hands  . Balance problem   . Benzodiazepine dependence ([place])   . Blood transfusion without reported diagnosis    pt thinks had blood trnsfusion at prostate ca surgery   . Blurred vision   . Carotid atherosclerosis   . Cataract   . Depression, major   . Glaucoma   . Hearing loss   . High cholesterol   . Hypertension   . Knee pain, bilateral   . Lacunar infarction ([place])   . Lumbar radiculopathy   . Ophthalmic herpes zoster   . Prostate cancer ([place])   . Sacroiliitis ([place])   . Vertigo    Past Surgical History:  Procedure Laterality Date  . COLONOSCOPY    . HERNIA REPAIR    . PROSTATECTOMY     Family History  Problem Relation Age of Onset  . Hypertension Other   . Colon cancer Neg Hx   . Colon polyps Neg Hx   . Esophageal cancer Neg Hx   . Rectal cancer Neg Hx   . Stomach cancer Neg Hx    Social History   Socioeconomic History  . Marital status: Married    Spouse name: Not on file  . Number of children: Not on file  . Years of education: Not on file  . Highest education level: Not on file  Occupational History  . Not on file  Tobacco Use  . Smoking status: Never Smoker  . Smokeless tobacco: Never Used  Substance and Sexual Activity  . Alcohol use: No  . Drug use: No  . Sexual activity: Not on file  Other Topics Concern  . Not on file  Social History Narrative  . Not on file   Social Determinants of Health   Financial Resource Strain:   . Difficulty of Paying Living Expenses: Not on file  Food Insecurity:   . Worried About Charity fundraiser in the Last Year: Not on file  . Ran Out of Food in the Last Year: Not on file  Transportation Needs:   . Lack of Transportation (Medical): Not on file  . Lack of Transportation (Non-Medical): Not on file  Physical Activity:   . Days of Exercise per Week: Not on file  . Minutes of Exercise per  Session: Not on file  Stress:   . Feeling of Stress : Not on file  Social Connections:   . Frequency of Communication with Friends and Family: Not on file  . Frequency of Social Gatherings with Friends and Family: Not on file  . Attends Religious Services: Not on file  . Active Member of Clubs or Organizations: Not on file  . Attends Archivist Meetings: Not on file  . Marital Status: Not on file    Outpatient Encounter Medications as of 03/18/2019  Medication Sig  . acetaminophen (TYLENOL) 325 MG tablet Take 650 mg by mouth every 4 (four) hours as needed for mild pain.  Marland Kitchen ALPRAZolam (XANAX) 0.25 MG tablet Take 1 tablet (0.25 mg total) by mouth 2 (two) times daily as needed for anxiety or sleep.  Marland Kitchen atorvastatin (LIPITOR) 20 MG tablet Take 1 tablet (20 mg total) by mouth daily at 6 PM.  . calcium carbonate (TUMS - DOSED IN MG ELEMENTAL CALCIUM) 500 MG chewable tablet Chew 2 tablets by mouth as needed for indigestion.  . cholestyramine (QUESTRAN) 4 g packet Take 4 g by mouth 2 (two) times daily.   . citalopram (CELEXA) 20 MG tablet Take 1 tablet (20 mg total) by mouth daily.  . clopidogrel (PLAVIX) 75 MG tablet Take 1 tablet (75 mg total) by mouth daily.  . irbesartan (AVAPRO) 150 MG tablet Take 1 tablet by mouth daily.  Marland Kitchen latanoprost (XALATAN) 0.005 % ophthalmic solution Place 1 drop into the right eye at bedtime.   . lidocaine (LIDODERM) 5 % Place 1 patch onto the skin daily. Remove & Discard patch within 12 hours or as directed by MD  . polyethylene glycol (MIRALAX / GLYCOLAX) packet Take 17 g by mouth daily as needed for mild constipation.  . Probiotic Product (ALIGN PO) Take 1 capsule by mouth daily.  . Wheat Dextrin (BENEFIBER) POWD Take 2 Scoops by mouth daily.   No facility-administered encounter medications on file as of 03/18/2019.    Activities of Daily Living In your present state of health, do you have any difficulty performing the following activities: 03/18/2019  04/06/2018  Hearing? [place]? Y -  Difficulty concentrating or making decisions? Y -  Walking or climbing stairs? Y -  Dressing or bathing? Y -  Doing errands, shopping? Y N  Preparing Food and eating ? Y -  Using the Toilet? Y -  In the past six months, have you accidently leaked urine? Y -  Do you have problems with loss of bowel control? Y -  Managing your Medications? Y -  Managing your  Finances? N -  Housekeeping or managing your Housekeeping? Y -  Some recent data might be hidden    Patient Care Team: Gayland Curry, DO as PCP - General (Geriatric Medicine)   Assessment:   This is a routine wellness examination for [place].  Exercise Activities and Dietary recommendations Current Exercise Habits: The patient does not participate in regular exercise at present, Exercise limited by: orthopedic condition(s)  Goals    . DIET - EAT MORE FRUITS AND VEGETABLES       Fall Risk Fall Risk  03/18/2019 10/23/2018 05/30/2018  Falls in the past year? 1 1 1   Number falls in past yr: 0 1 0  Injury with Fall? 1 0 -  Risk for fall due to : Impaired balance/gait;History of fall(s);Impaired vision - -  Follow up Falls evaluation completed;Falls prevention discussed - -   Is the patient's home free of loose throw rugs in walkways, pet beds, electrical cords, etc?   yes      Grab bars in the bathroom? yes      Handrails on the stairs?   yes      Adequate lighting?   yes  Timed Get Up and Go Performed: not able to perform  Depression Screen PHQ 2/9 Scores 03/18/2019 05/30/2018  PHQ - 2 Score 1 0    Cognitive Function MMSE - Mini Mental State Exam 03/18/2019  Orientation to time 3  Orientation to Place 5  Registration 3  Attention/ Calculation 0  Recall 1  Language- name 2 objects 2  Language- repeat 1  Language- follow 3 step command 2  Language- read & follow direction 1  Write a sentence 1  Copy design 0  Total score 19        Immunization History  Administered  Date(s) Administered  . Influenza, High Dose Seasonal PF 01/04/2017  . Influenza-Unspecified 12/10/2017, 01/16/2019  . Pneumococcal Conjugate-13 01/30/2014  . Pneumococcal Polysaccharide-23 11/13/2006  . Tdap 11/15/2007, 01/22/2019  . Zoster 11/15/2007    Qualifies for Shingles Vaccine? yes  Screening Tests Health Maintenance  Topic Date Due  . TETANUS/TDAP  01/21/2029  . INFLUENZA VACCINE  Completed  . PNA vac Low Risk Adult  Completed   Cancer Screenings: Lung: Low Dose CT Chest recommended if Age 91-80 years, 30 pack-year currently smoking OR have quit w/in 15years. Patient does not qualify. Colorectal: aged out  Additional Screenings: not indicated Hepatitis C Screening:      Plan:     I have personally reviewed and noted the following in the patient's chart:   . Medical and social history . Use of alcohol, tobacco or illicit drugs  . Current medications and supplements . Functional ability and status . Nutritional status . Physical activity . Advanced directives . List of other physicians . Hospitalizations, surgeries, and ER visits in previous 12 months . Vitals . Screenings to include cognitive, depression, and falls . Referrals and appointments  In addition, I have reviewed and discussed with patient certain preventive protocols, quality metrics, and best practice recommendations. A written personalized care plan for preventive services as well as general preventive health recommendations were provided to patient.     Royal Hawthorn, NP  03/18/2019

## 2019-03-18 NOTE — Patient Instructions (Signed)
[name] Duffy , Thank you for taking time to come for your Medicare Wellness Visit. I appreciate your ongoing commitment to your health goals. Please review the following plan we discussed and let me know if I can assist you in the future.   Screening recommendations/referrals: Colonoscopy aged out Recommended yearly ophthalmology/optometry visit for glaucoma screening and checkup Recommended yearly dental visit for hygiene and checkup  Vaccinations: Influenza vaccine up to date Pneumococcal vaccine up to date  Tdap vaccine up to date  Shingles vaccine declined    Advanced directives: reviewed full code does not want to change  Conditions/risks identified: fall risk   Next appointment: 1 year  Preventive Care 28 Years and Older, Male Preventive care refers to lifestyle choices and visits with your health care provider that can promote health and wellness. What does preventive care include?  A yearly physical exam. This is also called an annual well check.  Dental exams once or twice a year.  Routine eye exams. Ask your health care provider how often you should have your eyes checked.  Personal lifestyle choices, including:  Daily care of your teeth and gums.  Regular physical activity.  Eating a healthy diet.  Avoiding tobacco and drug use.  Limiting alcohol use.  Practicing safe sex.  Taking low doses of aspirin every day.  Taking vitamin and mineral supplements as recommended by your health care provider. What happens during an annual well check? The services and screenings done by your health care provider during your annual well check will depend on your age, overall health, lifestyle risk factors, and family history of disease. Counseling  Your health care provider may ask you questions about your:  Alcohol use.  Tobacco use.  Drug use.  Emotional well-being.  Home and relationship well-being.  Sexual activity.  Eating habits.  History of  falls.  Memory and ability to understand (cognition).  Work and work Statistician. Screening  You may have the following tests or measurements:  Height, weight, and BMI.  Blood pressure.  Lipid and cholesterol levels. These may be checked every 5 years, or more frequently if you are over 64 years old.  Skin check.  Lung cancer screening. You may have this screening every year starting at age 15 if you have a 30-pack-year history of smoking and currently smoke or have quit within the past 15 years.  Fecal occult blood test (FOBT) of the stool. You may have this test every year starting at age 29.  Flexible sigmoidoscopy or colonoscopy. You may have a sigmoidoscopy every 5 years or a colonoscopy every 10 years starting at age 5.  Prostate cancer screening. Recommendations will vary depending on your family history and other risks.  Hepatitis C blood test.  Hepatitis B blood test.  Sexually transmitted disease (STD) testing.  Diabetes screening. This is done by checking your blood sugar (glucose) after you have not eaten for a while (fasting). You may have this done every 1-3 years.  Abdominal aortic aneurysm (AAA) screening. You may need this if you are a current or former smoker.  Osteoporosis. You may be screened starting at age 10 if you are at high risk. Talk with your health care provider about your test results, treatment options, and if necessary, the need for more tests. Vaccines  Your health care provider may recommend certain vaccines, such as:  Influenza vaccine. This is recommended every year.  Tetanus, diphtheria, and acellular pertussis (Tdap, Td) vaccine. You may need a Td booster every 10  years.  Zoster vaccine. You may need this after age 53.  Pneumococcal 13-valent conjugate (PCV13) vaccine. One dose is recommended after age 39.  Pneumococcal polysaccharide (PPSV23) vaccine. One dose is recommended after age 50. Talk to your health care provider about  which screenings and vaccines you need and how often you need them. This information is not intended to replace advice given to you by your health care provider. Make sure you discuss any questions you have with your health care provider. Document Released: 04/02/2015 Document Revised: 11/24/2015 Document Reviewed: 01/05/2015 Elsevier Interactive Patient Education  2017 [place] Prevention in the Home Falls can cause injuries. They can happen to people of all ages. There are many things you can do to make your home safe and to help prevent falls. What can I do on the outside of my home?  Regularly fix the edges of walkways and driveways and fix any cracks.  Remove anything that might make you trip as you walk through a door, such as a raised step or threshold.  Trim any bushes or trees on the path to your home.  Use bright outdoor lighting.  Clear any walking paths of anything that might make someone trip, such as rocks or tools.  Regularly check to see if handrails are loose or broken. Make sure that both sides of any steps have handrails.  Any raised decks and porches should have guardrails on the edges.  Have any leaves, snow, or ice cleared regularly.  Use sand or salt on walking paths during winter.  Clean up any spills in your garage right away. This includes oil or grease spills. What can I do in the bathroom?  Use night lights.  Install grab bars by the toilet and in the tub and shower. Do not use towel bars as grab bars.  Use non-skid mats or decals in the tub or shower.  If you need to sit down in the shower, use a plastic, non-slip stool.  Keep the floor dry. Clean up any water that spills on the floor as soon as it happens.  Remove soap buildup in the tub or shower regularly.  Attach bath mats securely with double-sided non-slip rug tape.  Do not have throw rugs and other things on the floor that can make you trip. What can I do in the  bedroom?  Use night lights.  Make sure that you have a light by your bed that is easy to reach.  Do not use any sheets or blankets that are too big for your bed. They should not hang down onto the floor.  Have a firm chair that has side arms. You can use this for support while you get dressed.  Do not have throw rugs and other things on the floor that can make you trip. What can I do in the kitchen?  Clean up any spills right away.  Avoid walking on wet floors.  Keep items that you use a lot in easy-to-reach places.  If you need to reach something above you, use a strong step stool that has a grab bar.  Keep electrical cords out of the way.  Do not use floor polish or wax that makes floors slippery. If you must use wax, use non-skid floor wax.  Do not have throw rugs and other things on the floor that can make you trip. What can I do with my stairs?  Do not leave any items on the stairs.  Make sure that  there are handrails on both sides of the stairs and use them. Fix handrails that are broken or loose. Make sure that handrails are as long as the stairways.  Check any carpeting to make sure that it is firmly attached to the stairs. Fix any carpet that is loose or worn.  Avoid having throw rugs at the top or bottom of the stairs. If you do have throw rugs, attach them to the floor with carpet tape.  Make sure that you have a light switch at the top of the stairs and the bottom of the stairs. If you do not have them, ask someone to add them for you. What else can I do to help prevent falls?  Wear shoes that:  Do not have high heels.  Have rubber bottoms.  Are comfortable and fit you well.  Are closed at the toe. Do not wear sandals.  If you use a stepladder:  Make sure that it is fully opened. Do not climb a closed stepladder.  Make sure that both sides of the stepladder are locked into place.  Ask someone to hold it for you, if possible.  Clearly mark and make  sure that you can see:  Any grab bars or handrails.  First and last steps.  Where the edge of each step is.  Use tools that help you move around (mobility aids) if they are needed. These include:  Canes.  Walkers.  Scooters.  Crutches.  Turn on the lights when you go into a dark area. Replace any light bulbs as soon as they burn out.  Set up your furniture so you have a clear path. Avoid moving your furniture around.  If any of your floors are uneven, fix them.  If there are any pets around you, be aware of where they are.  Review your medicines with your doctor. Some medicines can make you feel dizzy. This can increase your chance of falling. Ask your doctor what other things that you can do to help prevent falls. This information is not intended to replace advice given to you by your health care provider. Make sure you discuss any questions you have with your health care provider. Document Released: 12/31/2008 Document Revised: 08/12/2015 Document Reviewed: 04/10/2014 Elsevier Interactive Patient Education  2017 [employer].

## 2019-03-24 DIAGNOSIS — Z9189 Other specified personal risk factors, not elsewhere classified: Secondary | ICD-10-CM | POA: Diagnosis not present

## 2019-03-24 DIAGNOSIS — Z20828 Contact with and (suspected) exposure to other viral communicable diseases: Secondary | ICD-10-CM | POA: Diagnosis not present

## 2019-03-31 ENCOUNTER — Other Ambulatory Visit: Payer: Self-pay | Admitting: *Deleted

## 2019-03-31 DIAGNOSIS — Z9189 Other specified personal risk factors, not elsewhere classified: Secondary | ICD-10-CM | POA: Diagnosis not present

## 2019-03-31 DIAGNOSIS — Z20828 Contact with and (suspected) exposure to other viral communicable diseases: Secondary | ICD-10-CM | POA: Diagnosis not present

## 2019-03-31 MED ORDER — ALPRAZOLAM 0.25 MG PO TABS: 0.2500 mg | ORAL_TABLET | Freq: Two times a day (BID) | ORAL | 2 refills | Status: DC | PRN

## 2019-04-01 DIAGNOSIS — Z23 Encounter for immunization: Secondary | ICD-10-CM | POA: Diagnosis not present

## 2019-04-04 ENCOUNTER — Other Ambulatory Visit: Payer: Self-pay | Admitting: Internal Medicine

## 2019-04-04 DIAGNOSIS — F419 Anxiety disorder, unspecified: Secondary | ICD-10-CM

## 2019-04-04 MED ORDER — ALPRAZOLAM 0.25 MG PO TABS: 0.2500 mg | ORAL_TABLET | Freq: Two times a day (BID) | ORAL | 5 refills | Status: DC | PRN

## 2019-04-07 DIAGNOSIS — Z9189 Other specified personal risk factors, not elsewhere classified: Secondary | ICD-10-CM | POA: Diagnosis not present

## 2019-04-07 DIAGNOSIS — Z20828 Contact with and (suspected) exposure to other viral communicable diseases: Secondary | ICD-10-CM | POA: Diagnosis not present

## 2019-04-14 DIAGNOSIS — Z9189 Other specified personal risk factors, not elsewhere classified: Secondary | ICD-10-CM | POA: Diagnosis not present

## 2019-04-14 DIAGNOSIS — Z20828 Contact with and (suspected) exposure to other viral communicable diseases: Secondary | ICD-10-CM | POA: Diagnosis not present

## 2019-04-21 ENCOUNTER — Encounter: Payer: Self-pay | Admitting: Internal Medicine

## 2019-04-21 DIAGNOSIS — Z9189 Other specified personal risk factors, not elsewhere classified: Secondary | ICD-10-CM | POA: Diagnosis not present

## 2019-04-21 DIAGNOSIS — Z20828 Contact with and (suspected) exposure to other viral communicable diseases: Secondary | ICD-10-CM | POA: Diagnosis not present

## 2019-04-25 DIAGNOSIS — Z20828 Contact with and (suspected) exposure to other viral communicable diseases: Secondary | ICD-10-CM | POA: Diagnosis not present

## 2019-04-25 DIAGNOSIS — Z9189 Other specified personal risk factors, not elsewhere classified: Secondary | ICD-10-CM | POA: Diagnosis not present

## 2019-04-29 DIAGNOSIS — Z23 Encounter for immunization: Secondary | ICD-10-CM | POA: Diagnosis not present

## 2019-05-07 ENCOUNTER — Encounter: Payer: Self-pay | Admitting: Internal Medicine

## 2019-05-07 ENCOUNTER — Other Ambulatory Visit: Payer: Self-pay

## 2019-05-07 ENCOUNTER — Non-Acute Institutional Stay: Payer: Medicare Other | Admitting: Internal Medicine

## 2019-05-07 VITALS — BP 138/78 | HR 97 | Temp 97.7°F | Ht 72.0 in | Wt 182.2 lb

## 2019-05-07 DIAGNOSIS — B372 Candidiasis of skin and nail: Secondary | ICD-10-CM

## 2019-05-07 DIAGNOSIS — G9340 Encephalopathy, unspecified: Secondary | ICD-10-CM

## 2019-05-07 DIAGNOSIS — H6123 Impacted cerumen, bilateral: Secondary | ICD-10-CM | POA: Diagnosis not present

## 2019-05-07 DIAGNOSIS — I69319 Unspecified symptoms and signs involving cognitive functions following cerebral infarction: Secondary | ICD-10-CM | POA: Diagnosis not present

## 2019-05-07 DIAGNOSIS — R3981 Functional urinary incontinence: Secondary | ICD-10-CM

## 2019-05-07 DIAGNOSIS — R159 Full incontinence of feces: Secondary | ICD-10-CM

## 2019-05-07 DIAGNOSIS — F0150 Vascular dementia without behavioral disturbance: Secondary | ICD-10-CM | POA: Diagnosis not present

## 2019-05-07 DIAGNOSIS — R152 Fecal urgency: Secondary | ICD-10-CM | POA: Diagnosis not present

## 2019-05-07 NOTE — Progress Notes (Signed)
Location:  [place] of Service:  Clinic (12)  Provider: Philana Younis L. Mariea Clonts, D.O., C.M.D.  Code Status: FULL CODE Goals of Care:  Advanced Directives 05/07/2019  Does Patient Have a Medical Advance Directive? Yes  Type of Advance Directive [place]  Does patient want to make changes to medical advance directive? No - Patient declined  Copy of [place] in Chart? Yes - validated most recent copy scanned in chart (See row information)  Would patient like information on creating a medical advance directive? -     Chief Complaint  Patient presents with  . Acute Visit    GI issues active bowels     HPI: Patient is a 86 y.o. male seen today for an acute visit for GI issues/active bowels.    He says he's ok, pretty good.  He says today is a quiet day for his stomach.  It's gotten harder to get to the bathroom in time.  He had a light incontinence this am.  Says there's a time element.  He is trying to get out of the wheelchair, turn around and get on the commode and doesn't quite make it.  It was a Chiropractor dump".  Got turned around and finished going.  It was between normal and loose.  He's had no hard stools.  He says he cannot hold the stools in.    Turns around and says that his stool was totally normal this morning.  "a keeper"    He is currently on benefiber 2 scoops daily, align probiotic daily, miralax daily prn constipation, questran 4g bid.    Last week's incontinence episode was not soft/loose stool.    His wife reports that he has typically two bms of concern.  Does not have problems with bms overnight once in bed.  Might have urinary incontinence.    We have checked his stools for blood and they've been negative x 3 on two occasions.      He's agreed to see another audiologist.  He's had cerumen impaction since his teens.  I did flush his ears here once.    Past Medical History:  Diagnosis Date  . Abnormal gait   .  Adenocarcinoma of prostate ([place])   . Anxiety   . Arthritis    hands  . Balance problem   . Benzodiazepine dependence ([place])   . Blood transfusion without reported diagnosis    pt thinks had blood trnsfusion at prostate ca surgery   . Blurred vision   . Carotid atherosclerosis   . Cataract   . Depression, major   . Glaucoma   . Hearing loss   . High cholesterol   . Hypertension   . Knee pain, bilateral   . Lacunar infarction ([place])   . Lumbar radiculopathy   . Ophthalmic herpes zoster   . Prostate cancer ([place])   . Sacroiliitis ([place])   . Vertigo     Past Surgical History:  Procedure Laterality Date  . COLONOSCOPY    . HERNIA REPAIR    . PROSTATECTOMY      Allergies  Allergen Reactions  . Rofecoxib Swelling    REACTION: swelling- Vioxx name brand     Outpatient Encounter Medications as of 05/07/2019  Medication Sig  . acetaminophen (TYLENOL) 325 MG tablet Take 650 mg by mouth every 4 (four) hours as needed for mild pain.  Marland Kitchen ALPRAZolam (XANAX) 0.25 MG tablet Take 1 tablet (0.25 mg total) by mouth  2 (two) times daily as needed for anxiety or sleep.  Marland Kitchen atorvastatin (LIPITOR) 20 MG tablet Take 1 tablet (20 mg total) by mouth daily at 6 PM.  . calcium carbonate (TUMS - DOSED IN MG ELEMENTAL CALCIUM) 500 MG chewable tablet Chew 2 tablets by mouth as needed for indigestion.  . cholestyramine (QUESTRAN) 4 g packet Take 4 g by mouth 2 (two) times daily.   . citalopram (CELEXA) 20 MG tablet Take 1 tablet (20 mg total) by mouth daily.  . clopidogrel (PLAVIX) 75 MG tablet Take 1 tablet (75 mg total) by mouth daily.  . irbesartan (AVAPRO) 150 MG tablet Take 1 tablet by mouth daily.  Marland Kitchen latanoprost (XALATAN) 0.005 % ophthalmic solution Place 1 drop into the right eye at bedtime.   . lidocaine (LIDODERM) 5 % Place 1 patch onto the skin daily. Remove & Discard patch within 12 hours or as directed by MD  . nystatin (NYSTATIN) powder Apply 1 application topically 3 (three) times daily.  .  polyethylene glycol (MIRALAX / GLYCOLAX) packet Take 17 g by mouth daily as needed for mild constipation.  . Probiotic Product (ALIGN PO) Take 1 capsule by mouth daily.  Marland Kitchen trolamine salicylate (ASPERCREME) 10 % cream Apply 1 application topically as needed for muscle pain.  . Wheat Dextrin (BENEFIBER) POWD Take 2 Scoops by mouth daily.   No facility-administered encounter medications on file as of 05/07/2019.    Review of Systems:  Review of Systems  Constitutional: Negative for chills, fever and malaise/fatigue.  HENT: Positive for hearing loss. Negative for congestion.   Eyes: Negative for blurred vision.  Respiratory: Negative for cough and shortness of breath.   Cardiovascular: Negative for chest pain, palpitations and leg swelling.  Gastrointestinal: Negative for abdominal pain, blood in stool, constipation, diarrhea and melena.       Sounds like stools vary from formed to slightly loose  Genitourinary: Positive for urgency. Negative for dysuria.       Urinary incontinence--uses depends with pads  Musculoskeletal: Negative for falls.  Skin: Positive for itching and rash.       In groin  Neurological: Negative for dizziness and loss of consciousness.  Endo/Heme/Allergies: Bruises/bleeds easily.  Psychiatric/Behavioral: Positive for memory loss. Negative for depression. The patient is not nervous/anxious and does not have insomnia.     Health Maintenance  Topic Date Due  . TETANUS/TDAP  01/21/2029  . INFLUENZA VACCINE  Completed  . PNA vac Low Risk Adult  Completed    Physical Exam: Vitals:   05/07/19 1334  BP: 138/78  Pulse: 97  Temp: 97.7 F (36.5 C)  SpO2: 98%  Weight: 182 lb 3.2 oz (82.6 kg)  Height: 6' (1.829 m)   Body mass index is 24.71 kg/m. Physical Exam Vitals reviewed.  Constitutional:      General: He is not in acute distress.    Appearance: Normal appearance. He is normal weight. He is not ill-appearing or toxic-appearing.  HENT:     Head:  Normocephalic and atraumatic.     Ears:     Comments: Bilateral cerumen impaction; so HOH that I had to talk loudly into his left ear OR write on the dry erase board to communicate (did not wear his hearing aids to visit)    Nose: Nose normal.     Mouth/Throat:     Pharynx: Oropharynx is clear.  Eyes:     Conjunctiva/sclera: Conjunctivae normal.     Pupils: Pupils are equal, round, and reactive to light.  Cardiovascular:     Rate and Rhythm: Normal rate and regular rhythm.     Pulses: Normal pulses.     Heart sounds: Normal heart sounds. No murmur.  Pulmonary:     Effort: Pulmonary effort is normal.     Breath sounds: Normal breath sounds. No rales.  Abdominal:     General: Bowel sounds are normal. There is no distension.     Palpations: Abdomen is soft.     Tenderness: There is no abdominal tenderness. There is no guarding or rebound.  Genitourinary:    Penis: Normal.      Testes: Normal.  Musculoskeletal:        General: Normal range of motion.     Cervical back: Neck supple.     Right lower leg: No edema.     Left lower leg: No edema.     Comments: Using manual wheelchair, was able to stand up from wheelchair albeit unsteadily (required CMA present and myself for me to feel safe having him in standing position for exam of rash)  Skin:    Capillary Refill: Capillary refill takes less than 2 seconds.     Comments: Mild erythema of groin and perineum; when he stood up, he began urinating in his depends  Neurological:     Mental Status: He is alert.     Coordination: Coordination abnormal.     Gait: Gait abnormal.     Comments: Repeats himself more each visit; gait remains unsteady with tendency to fall backwards; no CN deficits  Psychiatric:        Mood and Affect: Mood normal.     Labs reviewed: Basic Metabolic Panel: Recent Labs    08/07/18 0600 08/14/18 0600 08/23/18 0000  NA 141 139 139  K 4.6 4.5 4.0  BUN 17 19 19   CREATININE 0.8 1.1 1.0   Liver Function  Tests: No results for input(s): AST, ALT, ALKPHOS, BILITOT, PROT, ALBUMIN in the last 8760 hours. No results for input(s): LIPASE, AMYLASE in the last 8760 hours. No results for input(s): AMMONIA in the last 8760 hours. CBC: Recent Labs    08/07/18 0600  WBC 6.7  HGB 14.6  HCT 43  PLT 206   Lipid Panel: No results for input(s): CHOL, HDL, LDLCALC, TRIG, CHOLHDL, LDLDIRECT in the last 8760 hours. Lab Results  Component Value Date   HGBA1C 5.5 04/06/2018  MRI from 04/06/18:  CLINICAL DATA:  86 year old male with recent falls. Right leg cramps.  EXAM: MRI HEAD WITHOUT CONTRAST  TECHNIQUE: Multiplanar, multiecho pulse sequences of the brain and surrounding structures were obtained without intravenous contrast.  COMPARISON:  Head and cervical spine CT earlier today. Brain MRI 08/01/2017.  FINDINGS: Brain: Oval 12 millimeter focus of restricted diffusion in the left posterior centrum semiovale near the perirolandic cortex (series 4, image 28). Minimal T2 hyperintensity. No hemorrhage or mass effect.  No other restricted diffusion. Stable cerebral volume. Patchy and widely scattered cerebral white matter T2 and FLAIR hyperintensity outside of the acute finding is stable. There is a small area of chronic cortical encephalomalacia in the left superior frontal gyrus (series 7, image 25). No chronic cerebral blood products. Mild to moderate for age chronic T2 heterogeneity in the deep gray matter nuclei. Chronic bilateral posterior cerebellar infarcts.  No midline shift, mass effect, evidence of mass lesion, ventriculomegaly, extra-axial collection or acute intracranial hemorrhage. Cervicomedullary junction and pituitary are within normal limits.  Vascular: Major intracranial vascular flow voids are stable since 2019. Dominant right  vertebral artery with moderate fusiform dolichoectasia in the posterior fossa again noted. The left ICA siphon and terminus also are  ectatic.  Skull and upper cervical spine: Negative visible cervical spine. Visualized bone marrow signal is within normal limits.  Sinuses/Orbits: Postoperative changes to both globes since the prior MRI. Stable paranasal sinuses.  Other: Mastoids remain clear. Visible internal auditory structures appear normal. Scalp and face soft tissues appear negative.  IMPRESSION: 1. Acute small vessel white matter infarct in the left centrum semiovale near the left perirolandic cortex. No hemorrhage or mass effect. 2. Otherwise stable chronic ischemic disease in the brain since May 2019. 3. Chronic intracranial artery dolichoectasia. Electronically Signed   By: Genevie Ann M.D.   On: 04/06/2018 13:09  Assessment/Plan 1. Incontinence of feces with fecal urgency -educated patient and his wife about late effects of stroke and cognitive deficits causing him to lose control of his bowels -during discussion today, it seems his bowels range from formed to slightly lose, but he's not having true diarrhea suggesting the bulking medications (benefiber and Lucrezia Starch) are working quite well -recommended a stay in SNF for observation for at least a week to see what is happening with his stools and determine a good plan to manage them going forward (likely in snf long-term where, ideally, both he and his wife move to skilled)  2. Hearing loss due to cerumen impaction, bilateral - Ear Lavage was performed of both ears with warm water and peroxide, no instrumentation was used -pt was still overtly HOH -is to be seeing a new audiologist for reassessment  3. Vascular dementia without behavioral disturbance ([place]) -due to prior small strokes seen on his brain imaging and poor circulation in posterior brain -appears he is in need of long term SNF level of care due to progressive cognitive losses, gait instability/falls, and now worsening of his urinary and fecal incontinence that he cannot safely manage in the [place]  environment  4. 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 Functional urinary incontinence -has had some dribbling that he mentioned when I first saw him, but this has progressed, as well--now he urinates if he stands up and has almost no control at all--using depends and pads (was having challenges with changing them often enough which puts him at risk for infections and skin breakdown, yeast infections of skin)  7. Candidal skin infection -appears he has a yeast infection in his skin folds and perineal area -nystatin powder ordered in place of [employer]'s baby powder which can actually increase chafing with its' talc content -must stay clean and dry which he's not able to attend to safely on his own  Labs/tests ordered:  No new at this time Next appt:  07/09/2019; also I will see him in his SNF room during the observation period and check-in with SNF nursing about his bowel situation  Hera Celaya L. Quintrell Baze, D.O. [facility] [address]  [place], [place] [address] Cell Phone (Mon-Fri 8am-5pm):  [phone] On Call:  [phone] & follow prompts after 5pm & weekends Office Phone:  [phone] Office Fax:  [phone]

## 2019-05-08 DIAGNOSIS — I69319 Unspecified symptoms and signs involving cognitive functions following cerebral infarction: Secondary | ICD-10-CM | POA: Insufficient documentation

## 2019-05-13 ENCOUNTER — Encounter: Payer: Self-pay | Admitting: Internal Medicine

## 2019-05-13 ENCOUNTER — Non-Acute Institutional Stay (SKILLED_NURSING_FACILITY): Payer: Medicare Other | Admitting: Internal Medicine

## 2019-05-13 DIAGNOSIS — H409 Unspecified glaucoma: Secondary | ICD-10-CM

## 2019-05-13 DIAGNOSIS — G9340 Encephalopathy, unspecified: Secondary | ICD-10-CM | POA: Diagnosis not present

## 2019-05-13 DIAGNOSIS — R3981 Functional urinary incontinence: Secondary | ICD-10-CM | POA: Diagnosis not present

## 2019-05-13 DIAGNOSIS — M6389 Disorders of muscle in diseases classified elsewhere, multiple sites: Secondary | ICD-10-CM | POA: Diagnosis not present

## 2019-05-13 DIAGNOSIS — N39498 Other specified urinary incontinence: Secondary | ICD-10-CM | POA: Diagnosis not present

## 2019-05-13 DIAGNOSIS — R159 Full incontinence of feces: Secondary | ICD-10-CM | POA: Diagnosis not present

## 2019-05-13 DIAGNOSIS — F0150 Vascular dementia without behavioral disturbance: Secondary | ICD-10-CM | POA: Diagnosis not present

## 2019-05-13 DIAGNOSIS — R4189 Other symptoms and signs involving cognitive functions and awareness: Secondary | ICD-10-CM | POA: Diagnosis not present

## 2019-05-13 DIAGNOSIS — R278 Other lack of coordination: Secondary | ICD-10-CM | POA: Diagnosis not present

## 2019-05-13 DIAGNOSIS — R2689 Other abnormalities of gait and mobility: Secondary | ICD-10-CM | POA: Diagnosis not present

## 2019-05-13 DIAGNOSIS — R152 Fecal urgency: Secondary | ICD-10-CM

## 2019-05-13 DIAGNOSIS — H9113 Presbycusis, bilateral: Secondary | ICD-10-CM

## 2019-05-13 DIAGNOSIS — F411 Generalized anxiety disorder: Secondary | ICD-10-CM | POA: Diagnosis not present

## 2019-05-13 DIAGNOSIS — R296 Repeated falls: Secondary | ICD-10-CM | POA: Diagnosis not present

## 2019-05-14 DIAGNOSIS — R296 Repeated falls: Secondary | ICD-10-CM | POA: Diagnosis not present

## 2019-05-14 DIAGNOSIS — R278 Other lack of coordination: Secondary | ICD-10-CM | POA: Diagnosis not present

## 2019-05-14 DIAGNOSIS — N39498 Other specified urinary incontinence: Secondary | ICD-10-CM | POA: Diagnosis not present

## 2019-05-14 DIAGNOSIS — F0150 Vascular dementia without behavioral disturbance: Secondary | ICD-10-CM | POA: Diagnosis not present

## 2019-05-14 DIAGNOSIS — M6389 Disorders of muscle in diseases classified elsewhere, multiple sites: Secondary | ICD-10-CM | POA: Diagnosis not present

## 2019-05-14 DIAGNOSIS — R2689 Other abnormalities of gait and mobility: Secondary | ICD-10-CM | POA: Diagnosis not present

## 2019-05-15 DIAGNOSIS — R296 Repeated falls: Secondary | ICD-10-CM | POA: Diagnosis not present

## 2019-05-15 DIAGNOSIS — R278 Other lack of coordination: Secondary | ICD-10-CM | POA: Diagnosis not present

## 2019-05-15 DIAGNOSIS — F0150 Vascular dementia without behavioral disturbance: Secondary | ICD-10-CM | POA: Diagnosis not present

## 2019-05-15 DIAGNOSIS — M6389 Disorders of muscle in diseases classified elsewhere, multiple sites: Secondary | ICD-10-CM | POA: Diagnosis not present

## 2019-05-15 DIAGNOSIS — N39498 Other specified urinary incontinence: Secondary | ICD-10-CM | POA: Diagnosis not present

## 2019-05-15 DIAGNOSIS — R2689 Other abnormalities of gait and mobility: Secondary | ICD-10-CM | POA: Diagnosis not present

## 2019-05-16 DIAGNOSIS — F0150 Vascular dementia without behavioral disturbance: Secondary | ICD-10-CM | POA: Diagnosis not present

## 2019-05-16 DIAGNOSIS — N39498 Other specified urinary incontinence: Secondary | ICD-10-CM | POA: Diagnosis not present

## 2019-05-16 DIAGNOSIS — R278 Other lack of coordination: Secondary | ICD-10-CM | POA: Diagnosis not present

## 2019-05-16 DIAGNOSIS — M6389 Disorders of muscle in diseases classified elsewhere, multiple sites: Secondary | ICD-10-CM | POA: Diagnosis not present

## 2019-05-16 DIAGNOSIS — R2689 Other abnormalities of gait and mobility: Secondary | ICD-10-CM | POA: Diagnosis not present

## 2019-05-16 DIAGNOSIS — R296 Repeated falls: Secondary | ICD-10-CM | POA: Diagnosis not present

## 2019-05-16 NOTE — Progress Notes (Signed)
Provider:  Rexene Edison. Mariea Clonts, D.O., C.M.D. Location:    [facility]ursing Home Room Number: 124 Place of Service:  SNF (31)  PCP: Gayland Curry, DO Patient Care Team: Gayland Curry, DO as PCP - General (Geriatric Medicine)  Extended Emergency Contact Information Primary Emergency Contact: Jacob,[name] Address: [place]          [place] [address] [name] of [place] Phone: [phone] Relation: Spouse Secondary Emergency Contact: [name] Mobile Phone: [phone] Relation: Nephew  Code Status: full code Goals of Care: Advanced Directive information Advanced Directives 05/13/2019  Does Patient Have a Medical Advance Directive? Yes  Type of Advance Directive [place]  Does patient want to make changes to medical advance directive? No - Patient declined  Copy of [place] in Chart? -  Would patient like information on creating a medical advance directive? -   Chief Complaint  Patient presents with  . New Admit To SNF    observation of level of care needs     HPI: Patient is a 86 y.o. male with h/o vascular dementia and prior left sided lacunar stroke with residual RLE weakness, balance problems/cerebellar dysfunction and cognitive changes, HTN, seen today for admission to SNF to observe level of care needs and get a better picture of his bowel movement concerns and cognitive losses.  In AL, he was having increased difficulty with urinary and fecal incontinence and decreased awareness of when he needed to go try to use the restroom.  He struggled to describe his symptoms and at times we were getting reports that he had diarrhea, but he was actually simply having loss of control of his stools.  Both his urinary and fecal incontinence have been progressing along with his cognitive deficits since his stroke that led to their move to [facility] from [facility].    Mr. Dinuzzo is also very [place] and his nephew is assisting in  getting him further hearing reassessment and hearing aids (sounds like pt refused to pay for the last pair that was offered him).    He had also gotten weaker and more unsteady over time.  He's' now working with PT on that.  He's also getting an OT assessment here to see what his care needs are for daily function.  He likes to be independent, but does not have the cognitive ability to safely and adequately manage his incontinence, bathing, dressing.    He reports his rash in his groin is ongoing.  Unfortunately, he's continued to use his regular baby powder on his own rather than just the prescription nystatin I gave him after the last visit (says staff don't put enough on him, but when I spoke to them, the rash has already improved and there's not much redness remaining).  My concern is the talc in the powder is causing his itching.    He's had less fecal incontinence with regular toileting.  Past Medical History:  Diagnosis Date  . Abnormal gait   . Adenocarcinoma of prostate ([place])   . Anxiety   . Arthritis    hands  . Balance problem   . Benzodiazepine dependence ([place])   . Blood transfusion without reported diagnosis    pt thinks had blood trnsfusion at prostate ca surgery   . Blurred vision   . Carotid atherosclerosis   . Cataract   . Depression, major   . Glaucoma   . Hearing loss   . High cholesterol   . Hypertension   .  Knee pain, bilateral   . Lacunar infarction ([place])   . Lumbar radiculopathy   . Ophthalmic herpes zoster   . Prostate cancer ([place])   . Sacroiliitis ([place])   . Vertigo    Past Surgical History:  Procedure Laterality Date  . COLONOSCOPY    . HERNIA REPAIR    . PROSTATECTOMY      Social History   Socioeconomic History  . Marital status: Married    Spouse name: Not on file  . Number of children: Not on file  . Years of education: Not on file  . Highest education level: Not on file  Occupational History  . Not on file  Tobacco Use  . Smoking  status: Never Smoker  . Smokeless tobacco: Never Used  Substance and Sexual Activity  . Alcohol use: No  . Drug use: No  . Sexual activity: Not on file  Other Topics Concern  . Not on file  Social History Narrative  . Not on file   Social Determinants of Health   Financial Resource Strain:   . Difficulty of Paying Living Expenses: Not on file  Food Insecurity:   . Worried About Charity fundraiser in the Last Year: Not on file  . Ran Out of Food in the Last Year: Not on file  Transportation Needs:   . Lack of Transportation (Medical): Not on file  . Lack of Transportation (Non-Medical): Not on file  Physical Activity:   . Days of Exercise per Week: Not on file  . Minutes of Exercise per Session: Not on file  Stress:   . Feeling of Stress : Not on file  Social Connections:   . Frequency of Communication with Friends and Family: Not on file  . Frequency of Social Gatherings with Friends and Family: Not on file  . Attends Religious Services: Not on file  . Active Member of Clubs or Organizations: Not on file  . Attends Archivist Meetings: Not on file  . Marital Status: Not on file    reports that he has never smoked. He has never used smokeless tobacco. He reports that he does not drink alcohol or use drugs.  Functional Status Survey:    Family History  Problem Relation Age of Onset  . Hypertension Other   . Colon cancer Neg Hx   . Colon polyps Neg Hx   . Esophageal cancer Neg Hx   . Rectal cancer Neg Hx   . Stomach cancer Neg Hx     Health Maintenance  Topic Date Due  . TETANUS/TDAP  01/21/2029  . INFLUENZA VACCINE  Completed  . PNA vac Low Risk Adult  Completed    Allergies  Allergen Reactions  . Rofecoxib Swelling    REACTION: swelling- Vioxx name brand     Outpatient Encounter Medications as of 05/13/2019  Medication Sig  . acetaminophen (TYLENOL) 325 MG tablet Take 650 mg by mouth every 4 (four) hours as needed for mild pain.  Marland Kitchen ALPRAZolam  (XANAX) 0.25 MG tablet Take 1 tablet (0.25 mg total) by mouth 2 (two) times daily as needed for anxiety or sleep.  Marland Kitchen atorvastatin (LIPITOR) 20 MG tablet Take 1 tablet (20 mg total) by mouth daily at 6 PM.  . calcium carbonate (TUMS - DOSED IN MG ELEMENTAL CALCIUM) 500 MG chewable tablet Chew 2 tablets by mouth as needed for indigestion.  . cholestyramine (QUESTRAN) 4 g packet Take 4 g by mouth 2 (two) times daily.   . citalopram (  CELEXA) 20 MG tablet Take 1 tablet (20 mg total) by mouth daily.  . clopidogrel (PLAVIX) 75 MG tablet Take 1 tablet (75 mg total) by mouth daily.  . irbesartan (AVAPRO) 150 MG tablet Take 1 tablet by mouth daily.  Marland Kitchen latanoprost (XALATAN) 0.005 % ophthalmic solution Place 1 drop into the right eye at bedtime.   . lidocaine (LIDODERM) 5 % Place 1 patch onto the skin daily. Remove & Discard patch within 12 hours or as directed by MD  . nystatin (NYSTATIN) powder Apply 1 application topically 3 (three) times daily.  . polyethylene glycol (MIRALAX / GLYCOLAX) packet Take 17 g by mouth daily as needed for mild constipation.  . Probiotic Product (ALIGN PO) Take 1 capsule by mouth daily.  Marland Kitchen trolamine salicylate (ASPERCREME) 10 % cream Apply 1 application topically as needed for muscle pain.  . Wheat Dextrin (BENEFIBER) POWD Take 2 Scoops by mouth daily.   No facility-administered encounter medications on file as of 05/13/2019.    Review of Systems  Constitutional: Negative for chills, fever and malaise/fatigue.  HENT: Positive for hearing loss. Negative for congestion and sore throat.        I wrote things on paper--needs dry erase board until hearing aids received that are effective  Eyes: Negative for blurred vision.  Respiratory: Negative for cough and shortness of breath.        Has appearance of dyspnea but denies it  Cardiovascular: Negative for chest pain, palpitations and leg swelling.  Gastrointestinal: Negative for abdominal pain, blood in stool, constipation,  diarrhea and melena.       Fecal incontinence if does not go regularly due to unawareness  Genitourinary: Negative for dysuria.       Incontinence--just has outright leakage now when stands  Musculoskeletal: Negative for falls and joint pain.  Skin: Positive for itching and rash (improving).  Neurological: Positive for focal weakness. Negative for dizziness and loss of consciousness.  Endo/Heme/Allergies: Does not bruise/bleed easily.  Psychiatric/Behavioral: Positive for memory loss. Negative for depression. The patient is not nervous/anxious and does not have insomnia.     Vitals:   05/13/19 1605  BP: (!) 145/84  Pulse: 76  Temp: 98.5 F (36.9 C)  SpO2: 94%  Weight: 167 lb 3.2 oz (75.8 kg)  Height: 6\' 1"  (1.854 m)   Body mass index is 22.06 kg/m. Physical Exam  Labs reviewed: Basic Metabolic Panel: Recent Labs    08/07/18 0600 08/14/18 0600 08/23/18 0000  NA 141 139 139  K 4.6 4.5 4.0  BUN 17 19 19   CREATININE 0.8 1.1 1.0   Liver Function Tests: No results for input(s): AST, ALT, ALKPHOS, BILITOT, PROT, ALBUMIN in the last 8760 hours. No results for input(s): LIPASE, AMYLASE in the last 8760 hours. No results for input(s): AMMONIA in the last 8760 hours. CBC: Recent Labs    08/07/18 0600  WBC 6.7  HGB 14.6  HCT 43  PLT 206   Cardiac Enzymes: No results for input(s): CKTOTAL, CKMB, CKMBINDEX, TROPONINI in the last 8760 hours. BNP: Invalid input(s): POCBNP Lab Results  Component Value Date   HGBA1C 5.5 04/06/2018   Lab Results  Component Value Date   TSH 1.649 04/06/2018    Imaging and Procedures obtained prior to SNF admission: MR CARDIAC MORPHOLOGY W WO CONTRAST  Result Date: 04/10/2018 CLINICAL DATA:  86 year old male with suspicion for a left atrial mass on an echocardiogram. EXAM: CARDIAC MRI TECHNIQUE: The patient was scanned on a 1.5 Tesla GE magnet.  A dedicated cardiac coil was used. Functional imaging was done using Fiesta sequences. 2,3, and  4 chamber views were done to assess for RWMA's. Modified Simpson's rule using a short axis stack was used to calculate an ejection fraction on a dedicated work Conservation officer, nature. The patient received 8 cc of Gadavist. After 10 minutes inversion recovery sequences were used to assess for infiltration and scar tissue. CONTRAST:  8 cc  of Gadavist FINDINGS: 1. Normal left ventricular size, thickness and systolic function (LVEF = 56%). There are no regional wall motion abnormalities. There is no late gadolinium enhancement in the left ventricular myocardium. LVEDD: 42 mm LVESD: 34 mm LVEDV: 89 ml LVESV: 39 ml SV: 50 ml CO: 4.3 ml Myocardial mass: 75 g 2. Normal right ventricular size, thickness and systolic function (LVEF = 48%). There are no regional wall motion abnormalities. 3.  Normal left atrial size.  Mildly dilated right atrium. 4. Normal size of the aortic root, ascending aorta and pulmonary artery. 5.  Trivial mitral and tricuspid regurgitation. 6.  Normal pericardium.  No pericardial effusion. IMPRESSION: 1. Normal left ventricular size, thickness and systolic function (LVEF = 56%). There are no regional wall motion abnormalities. There is no late gadolinium enhancement in the left ventricular myocardium. 2. Normal right ventricular size, thickness and systolic function (LVEF = 48%). There are no regional wall motion abnormalities. 3.  Normal left atrial size.  Mildly dilated right atrium. 4.  Trivial mitral and tricuspid regurgitation. There is no evidence of a left atrial mass. Ena Dawley Electronically Signed   By: Ena Dawley   On: 04/10/2018 20:05    Assessment/Plan 1. Incontinence of feces with fecal urgency -working with OT and nursing on a routine for this -seems improved already after just a few days in skilled care -recommend he and his wife move together to skilled care  2. Vascular dementia without behavioral disturbance ([place]) -progressing, Belenda Cruise also seems aware  of this decline now and is helping more with the plan to get him more assistance  3. Presbycusis of both ears -needs f/u with audiology and new hearing aids--hopefully, he can be convinced that it's worth the cost at this point since we need to write things down for him to understand things even when his ears are clear of cerumen  4. Cerebellar dysfunction -cont walker use at all times, wants to get a new scooter and OT had been evaluating for that (?cognitively safe)  5. Functional urinary incontinence -continue scheduled toileting here, frequent pad and depend changing to prevent infections and skin breakdown -cont nystatin for rash and avoid baby powder with talc that's irritating, may use baby oil on dry skin of upper body as desired  6. Glaucoma of both eyes, unspecified glaucoma type -cont current therapy  Family/ staff Communication: discussed with snf nurses at change of shift from first to second  Labs/tests ordered:  No new  Alva Kuenzel L. Ryne Mctigue, D.O. [facility] [address], [place] [address] Cell Phone (Mon-Fri 8am-5pm):  [phone] On Call:  [phone] & follow prompts after 5pm & weekends Office Phone:  [phone] Office Fax:  [phone]

## 2019-05-19 DIAGNOSIS — R296 Repeated falls: Secondary | ICD-10-CM | POA: Diagnosis not present

## 2019-05-19 DIAGNOSIS — N39498 Other specified urinary incontinence: Secondary | ICD-10-CM | POA: Diagnosis not present

## 2019-05-19 DIAGNOSIS — R278 Other lack of coordination: Secondary | ICD-10-CM | POA: Diagnosis not present

## 2019-05-19 DIAGNOSIS — M6389 Disorders of muscle in diseases classified elsewhere, multiple sites: Secondary | ICD-10-CM | POA: Diagnosis not present

## 2019-05-19 DIAGNOSIS — F0150 Vascular dementia without behavioral disturbance: Secondary | ICD-10-CM | POA: Diagnosis not present

## 2019-05-19 DIAGNOSIS — F411 Generalized anxiety disorder: Secondary | ICD-10-CM | POA: Diagnosis not present

## 2019-05-19 DIAGNOSIS — R2689 Other abnormalities of gait and mobility: Secondary | ICD-10-CM | POA: Diagnosis not present

## 2019-05-19 DIAGNOSIS — R4189 Other symptoms and signs involving cognitive functions and awareness: Secondary | ICD-10-CM | POA: Diagnosis not present

## 2019-05-20 DIAGNOSIS — R2689 Other abnormalities of gait and mobility: Secondary | ICD-10-CM | POA: Diagnosis not present

## 2019-05-20 DIAGNOSIS — M6389 Disorders of muscle in diseases classified elsewhere, multiple sites: Secondary | ICD-10-CM | POA: Diagnosis not present

## 2019-05-20 DIAGNOSIS — N39498 Other specified urinary incontinence: Secondary | ICD-10-CM | POA: Diagnosis not present

## 2019-05-20 DIAGNOSIS — R296 Repeated falls: Secondary | ICD-10-CM | POA: Diagnosis not present

## 2019-05-20 DIAGNOSIS — F0150 Vascular dementia without behavioral disturbance: Secondary | ICD-10-CM | POA: Diagnosis not present

## 2019-05-20 DIAGNOSIS — R278 Other lack of coordination: Secondary | ICD-10-CM | POA: Diagnosis not present

## 2019-05-21 DIAGNOSIS — F0150 Vascular dementia without behavioral disturbance: Secondary | ICD-10-CM | POA: Diagnosis not present

## 2019-05-21 DIAGNOSIS — R278 Other lack of coordination: Secondary | ICD-10-CM | POA: Diagnosis not present

## 2019-05-21 DIAGNOSIS — R296 Repeated falls: Secondary | ICD-10-CM | POA: Diagnosis not present

## 2019-05-21 DIAGNOSIS — M6389 Disorders of muscle in diseases classified elsewhere, multiple sites: Secondary | ICD-10-CM | POA: Diagnosis not present

## 2019-05-21 DIAGNOSIS — N39498 Other specified urinary incontinence: Secondary | ICD-10-CM | POA: Diagnosis not present

## 2019-05-21 DIAGNOSIS — R2689 Other abnormalities of gait and mobility: Secondary | ICD-10-CM | POA: Diagnosis not present

## 2019-05-22 DIAGNOSIS — M6389 Disorders of muscle in diseases classified elsewhere, multiple sites: Secondary | ICD-10-CM | POA: Diagnosis not present

## 2019-05-22 DIAGNOSIS — R296 Repeated falls: Secondary | ICD-10-CM | POA: Diagnosis not present

## 2019-05-22 DIAGNOSIS — R278 Other lack of coordination: Secondary | ICD-10-CM | POA: Diagnosis not present

## 2019-05-22 DIAGNOSIS — N39498 Other specified urinary incontinence: Secondary | ICD-10-CM | POA: Diagnosis not present

## 2019-05-22 DIAGNOSIS — R2689 Other abnormalities of gait and mobility: Secondary | ICD-10-CM | POA: Diagnosis not present

## 2019-05-22 DIAGNOSIS — F0150 Vascular dementia without behavioral disturbance: Secondary | ICD-10-CM | POA: Diagnosis not present

## 2019-05-23 DIAGNOSIS — R296 Repeated falls: Secondary | ICD-10-CM | POA: Diagnosis not present

## 2019-05-23 DIAGNOSIS — M6389 Disorders of muscle in diseases classified elsewhere, multiple sites: Secondary | ICD-10-CM | POA: Diagnosis not present

## 2019-05-23 DIAGNOSIS — N39498 Other specified urinary incontinence: Secondary | ICD-10-CM | POA: Diagnosis not present

## 2019-05-23 DIAGNOSIS — R2689 Other abnormalities of gait and mobility: Secondary | ICD-10-CM | POA: Diagnosis not present

## 2019-05-23 DIAGNOSIS — F0150 Vascular dementia without behavioral disturbance: Secondary | ICD-10-CM | POA: Diagnosis not present

## 2019-05-23 DIAGNOSIS — R278 Other lack of coordination: Secondary | ICD-10-CM | POA: Diagnosis not present

## 2019-05-26 DIAGNOSIS — F0150 Vascular dementia without behavioral disturbance: Secondary | ICD-10-CM | POA: Diagnosis not present

## 2019-05-26 DIAGNOSIS — R2689 Other abnormalities of gait and mobility: Secondary | ICD-10-CM | POA: Diagnosis not present

## 2019-05-26 DIAGNOSIS — R296 Repeated falls: Secondary | ICD-10-CM | POA: Diagnosis not present

## 2019-05-26 DIAGNOSIS — M6389 Disorders of muscle in diseases classified elsewhere, multiple sites: Secondary | ICD-10-CM | POA: Diagnosis not present

## 2019-05-26 DIAGNOSIS — N39498 Other specified urinary incontinence: Secondary | ICD-10-CM | POA: Diagnosis not present

## 2019-05-26 DIAGNOSIS — R278 Other lack of coordination: Secondary | ICD-10-CM | POA: Diagnosis not present

## 2019-05-27 DIAGNOSIS — N39498 Other specified urinary incontinence: Secondary | ICD-10-CM | POA: Diagnosis not present

## 2019-05-27 DIAGNOSIS — M6389 Disorders of muscle in diseases classified elsewhere, multiple sites: Secondary | ICD-10-CM | POA: Diagnosis not present

## 2019-05-27 DIAGNOSIS — R296 Repeated falls: Secondary | ICD-10-CM | POA: Diagnosis not present

## 2019-05-27 DIAGNOSIS — R278 Other lack of coordination: Secondary | ICD-10-CM | POA: Diagnosis not present

## 2019-05-27 DIAGNOSIS — F0150 Vascular dementia without behavioral disturbance: Secondary | ICD-10-CM | POA: Diagnosis not present

## 2019-05-27 DIAGNOSIS — R2689 Other abnormalities of gait and mobility: Secondary | ICD-10-CM | POA: Diagnosis not present

## 2019-05-28 DIAGNOSIS — Z20828 Contact with and (suspected) exposure to other viral communicable diseases: Secondary | ICD-10-CM | POA: Diagnosis not present

## 2019-05-28 DIAGNOSIS — R296 Repeated falls: Secondary | ICD-10-CM | POA: Diagnosis not present

## 2019-05-28 DIAGNOSIS — R2689 Other abnormalities of gait and mobility: Secondary | ICD-10-CM | POA: Diagnosis not present

## 2019-05-28 DIAGNOSIS — Z9189 Other specified personal risk factors, not elsewhere classified: Secondary | ICD-10-CM | POA: Diagnosis not present

## 2019-05-28 DIAGNOSIS — F0150 Vascular dementia without behavioral disturbance: Secondary | ICD-10-CM | POA: Diagnosis not present

## 2019-05-28 DIAGNOSIS — N39498 Other specified urinary incontinence: Secondary | ICD-10-CM | POA: Diagnosis not present

## 2019-05-28 DIAGNOSIS — R278 Other lack of coordination: Secondary | ICD-10-CM | POA: Diagnosis not present

## 2019-05-28 DIAGNOSIS — M6389 Disorders of muscle in diseases classified elsewhere, multiple sites: Secondary | ICD-10-CM | POA: Diagnosis not present

## 2019-05-29 DIAGNOSIS — N39498 Other specified urinary incontinence: Secondary | ICD-10-CM | POA: Diagnosis not present

## 2019-05-29 DIAGNOSIS — R278 Other lack of coordination: Secondary | ICD-10-CM | POA: Diagnosis not present

## 2019-05-29 DIAGNOSIS — R2689 Other abnormalities of gait and mobility: Secondary | ICD-10-CM | POA: Diagnosis not present

## 2019-05-29 DIAGNOSIS — F0150 Vascular dementia without behavioral disturbance: Secondary | ICD-10-CM | POA: Diagnosis not present

## 2019-05-29 DIAGNOSIS — M6389 Disorders of muscle in diseases classified elsewhere, multiple sites: Secondary | ICD-10-CM | POA: Diagnosis not present

## 2019-05-29 DIAGNOSIS — R296 Repeated falls: Secondary | ICD-10-CM | POA: Diagnosis not present

## 2019-05-30 DIAGNOSIS — F0150 Vascular dementia without behavioral disturbance: Secondary | ICD-10-CM | POA: Diagnosis not present

## 2019-05-30 DIAGNOSIS — N39498 Other specified urinary incontinence: Secondary | ICD-10-CM | POA: Diagnosis not present

## 2019-05-30 DIAGNOSIS — R296 Repeated falls: Secondary | ICD-10-CM | POA: Diagnosis not present

## 2019-05-30 DIAGNOSIS — M6389 Disorders of muscle in diseases classified elsewhere, multiple sites: Secondary | ICD-10-CM | POA: Diagnosis not present

## 2019-05-30 DIAGNOSIS — R2689 Other abnormalities of gait and mobility: Secondary | ICD-10-CM | POA: Diagnosis not present

## 2019-05-30 DIAGNOSIS — R278 Other lack of coordination: Secondary | ICD-10-CM | POA: Diagnosis not present

## 2019-06-02 DIAGNOSIS — M6389 Disorders of muscle in diseases classified elsewhere, multiple sites: Secondary | ICD-10-CM | POA: Diagnosis not present

## 2019-06-02 DIAGNOSIS — R2689 Other abnormalities of gait and mobility: Secondary | ICD-10-CM | POA: Diagnosis not present

## 2019-06-02 DIAGNOSIS — R296 Repeated falls: Secondary | ICD-10-CM | POA: Diagnosis not present

## 2019-06-02 DIAGNOSIS — F0150 Vascular dementia without behavioral disturbance: Secondary | ICD-10-CM | POA: Diagnosis not present

## 2019-06-02 DIAGNOSIS — N39498 Other specified urinary incontinence: Secondary | ICD-10-CM | POA: Diagnosis not present

## 2019-06-02 DIAGNOSIS — R278 Other lack of coordination: Secondary | ICD-10-CM | POA: Diagnosis not present

## 2019-06-04 DIAGNOSIS — N39498 Other specified urinary incontinence: Secondary | ICD-10-CM | POA: Diagnosis not present

## 2019-06-04 DIAGNOSIS — R278 Other lack of coordination: Secondary | ICD-10-CM | POA: Diagnosis not present

## 2019-06-04 DIAGNOSIS — R2689 Other abnormalities of gait and mobility: Secondary | ICD-10-CM | POA: Diagnosis not present

## 2019-06-04 DIAGNOSIS — F0150 Vascular dementia without behavioral disturbance: Secondary | ICD-10-CM | POA: Diagnosis not present

## 2019-06-04 DIAGNOSIS — R296 Repeated falls: Secondary | ICD-10-CM | POA: Diagnosis not present

## 2019-06-04 DIAGNOSIS — M6389 Disorders of muscle in diseases classified elsewhere, multiple sites: Secondary | ICD-10-CM | POA: Diagnosis not present

## 2019-06-05 ENCOUNTER — Non-Acute Institutional Stay (SKILLED_NURSING_FACILITY): Payer: Medicare Other | Admitting: Adult Health

## 2019-06-05 DIAGNOSIS — R159 Full incontinence of feces: Secondary | ICD-10-CM

## 2019-06-05 DIAGNOSIS — I10 Essential (primary) hypertension: Secondary | ICD-10-CM | POA: Diagnosis not present

## 2019-06-05 DIAGNOSIS — R2689 Other abnormalities of gait and mobility: Secondary | ICD-10-CM | POA: Diagnosis not present

## 2019-06-05 DIAGNOSIS — R05 Cough: Secondary | ICD-10-CM

## 2019-06-05 DIAGNOSIS — R278 Other lack of coordination: Secondary | ICD-10-CM | POA: Diagnosis not present

## 2019-06-05 DIAGNOSIS — R152 Fecal urgency: Secondary | ICD-10-CM | POA: Diagnosis not present

## 2019-06-05 DIAGNOSIS — F0150 Vascular dementia without behavioral disturbance: Secondary | ICD-10-CM

## 2019-06-05 DIAGNOSIS — F411 Generalized anxiety disorder: Secondary | ICD-10-CM | POA: Diagnosis not present

## 2019-06-05 DIAGNOSIS — R059 Cough, unspecified: Secondary | ICD-10-CM

## 2019-06-05 DIAGNOSIS — R296 Repeated falls: Secondary | ICD-10-CM | POA: Diagnosis not present

## 2019-06-05 DIAGNOSIS — M6389 Disorders of muscle in diseases classified elsewhere, multiple sites: Secondary | ICD-10-CM | POA: Diagnosis not present

## 2019-06-05 DIAGNOSIS — N39498 Other specified urinary incontinence: Secondary | ICD-10-CM | POA: Diagnosis not present

## 2019-06-05 NOTE — Progress Notes (Signed)
Location:  Occupational psychologist of Service:  SNF (31) Provider:   Cindi Carbon, ANP [place] [phone]   Gayland Curry, DO  Patient Care Team: Gayland Curry, DO as PCP - General (Geriatric Medicine)  Extended Emergency Contact Information Primary Emergency Contact: Roehrig,[name] Address: [place]          [place] [address] [name] of [place] Phone: [phone] Relation: Spouse Secondary Emergency Contact: [name] Mobile Phone: [phone] Relation: Nephew  Code Status:  Full code Goals of care: Advanced Directive information Advanced Directives 05/13/2019  Does Patient Have a Medical Advance Directive? Yes  Type of Advance Directive [place]  Does patient want to make changes to medical advance directive? No - Patient declined  Copy of [place] in Chart? -  Would patient like information on creating a medical advance directive? -     Chief Complaint  Patient presents with  . Medical Management of Chronic Issues    HPI:  Pt is a 86 y.o. male seen today for medical management of chronic diseases.   He moved to skilled care last month due to incontinence and functional decline and seems to have adjusted well. He has had issues with diarrhea and incontinence but this has improved with a new toilet regimen and routine care by the nurses. His nurse reports that his DBP is over 90 frequently. She also reports he gets anxious and "panicky" when his wife visits in the afternoon. He reports a cough for 1 month but no sore throat, fever, body aches, trouble breathing, runny nose, etc. The nurse reports she has only seen in cough 1 time in the past month and the nurses notes confirm this. He has received both covid vaccines. His weight is stable (erroneous error in epic with weight of 167 which was not accurate) and he has not had any edema.   Past Medical History:    Diagnosis Date  . Abnormal gait   . Adenocarcinoma of prostate ([place])   . Anxiety   . Arthritis    hands  . Balance problem   . Benzodiazepine dependence ([place])   . Blood transfusion without reported diagnosis    pt thinks had blood trnsfusion at prostate ca surgery   . Blurred vision   . Carotid atherosclerosis   . Cataract   . Depression, major   . Glaucoma   . Hearing loss   . High cholesterol   . Hypertension   . Knee pain, bilateral   . Lacunar infarction ([place])   . Lumbar radiculopathy   . Ophthalmic herpes zoster   . Prostate cancer ([place])   . Sacroiliitis ([place])   . Vertigo    Past Surgical History:  Procedure Laterality Date  . COLONOSCOPY    . HERNIA REPAIR    . PROSTATECTOMY      Allergies  Allergen Reactions  . Rofecoxib Swelling    REACTION: swelling- Vioxx name brand     Outpatient Encounter Medications as of 06/05/2019  Medication Sig  . irbesartan (AVAPRO) 300 MG tablet Take 300 mg by mouth daily.  Marland Kitchen loratadine (CLARITIN) 10 MG tablet Take 10 mg by mouth daily.  Marland Kitchen acetaminophen (TYLENOL) 325 MG tablet Take 650 mg by mouth every 4 (four) hours as needed for mild pain.  Marland Kitchen ALPRAZolam (XANAX) 0.25 MG tablet Take 1 tablet (0.25 mg total) by mouth 2 (two) times daily as needed for anxiety or sleep.  Marland Kitchen  atorvastatin (LIPITOR) 20 MG tablet Take 1 tablet (20 mg total) by mouth daily at 6 PM.  . calcium carbonate (TUMS - DOSED IN MG ELEMENTAL CALCIUM) 500 MG chewable tablet Chew 2 tablets by mouth as needed for indigestion.  . cholestyramine (QUESTRAN) 4 g packet Take 4 g by mouth 2 (two) times daily.   . citalopram (CELEXA) 20 MG tablet Take 1 tablet (20 mg total) by mouth daily.  . clopidogrel (PLAVIX) 75 MG tablet Take 1 tablet (75 mg total) by mouth daily.  Marland Kitchen latanoprost (XALATAN) 0.005 % ophthalmic solution Place 1 drop into the right eye at bedtime.   . lidocaine (LIDODERM) 5 % Place 1 patch onto the skin daily. Remove & Discard patch within 12 hours or as  directed by MD  . nystatin (NYSTATIN) powder Apply 1 application topically 3 (three) times daily.  . polyethylene glycol (MIRALAX / GLYCOLAX) packet Take 17 g by mouth daily as needed for mild constipation.  . Probiotic Product (ALIGN PO) Take 1 capsule by mouth daily.  Marland Kitchen trolamine salicylate (ASPERCREME) 10 % cream Apply 1 application topically as needed for muscle pain.  . Wheat Dextrin (BENEFIBER) POWD Take 2 Scoops by mouth daily.  . [DISCONTINUED] irbesartan (AVAPRO) 150 MG tablet Take 1 tablet by mouth daily.   No facility-administered encounter medications on file as of 06/05/2019.    Review of Systems  Constitutional: Negative for activity change, appetite change, chills, diaphoresis, fatigue, fever and unexpected weight change.  Respiratory: Positive for shortness of breath (with anxiety not exertion). Negative for cough (reports cough but nurse has not heard), wheezing and stridor.   Cardiovascular: Negative for chest pain, palpitations and leg swelling.  Gastrointestinal: Negative for abdominal distention, abdominal pain, constipation and diarrhea.  Genitourinary: Negative for difficulty urinating and dysuria.  Musculoskeletal: Positive for gait problem. Negative for arthralgias, back pain, joint swelling and myalgias.  Neurological: Negative for dizziness, seizures, syncope, facial asymmetry, speech difficulty, weakness and headaches.  Hematological: Negative for adenopathy. Does not bruise/bleed easily.  Psychiatric/Behavioral: Positive for confusion. Negative for agitation and behavioral problems. The patient is nervous/anxious.     Immunization History  Administered Date(s) Administered  . Influenza, High Dose Seasonal PF 01/04/2017  . Influenza-Unspecified 12/10/2017, 01/16/2019  . Moderna SARS-COVID-2 Vaccination 04/11/2019, 04/29/2019  . Pneumococcal Conjugate-13 01/30/2014  . Pneumococcal Polysaccharide-23 11/13/2006  . Tdap 11/15/2007, 01/22/2019  . Zoster 11/15/2007    Pertinent  Health Maintenance Due  Topic Date Due  . INFLUENZA VACCINE  Completed  . PNA vac Low Risk Adult  Completed   Fall Risk  05/07/2019 03/18/2019 10/23/2018 05/30/2018  Falls in the past year? 0 1 1 1   Number falls in past yr: 0 0 1 0  Injury with Fall? 0 1 0 -  Risk for fall due to : - Impaired balance/gait;History of fall(s);Impaired vision - -  Follow up - Falls evaluation completed;Falls prevention discussed - -   Functional Status Survey:    Vitals:   06/06/19 0926  Weight: 181 lb 9.6 oz (82.4 kg)   Body mass index is 23.96 kg/m.  Wt Readings from Last 3 Encounters:  06/06/19 181 lb 9.6 oz (82.4 kg)  05/13/19 167 lb 3.2 oz (75.8 kg)  05/07/19 182 lb 3.2 oz (82.6 kg)   Physical Exam Constitutional:      General: He is not in acute distress.    Appearance: He is not diaphoretic.  HENT:     Head: Normocephalic and atraumatic.     Nose:  Nose normal. No congestion.     Mouth/Throat:     Mouth: Mucous membranes are moist.     Pharynx: Oropharynx is clear. No oropharyngeal exudate.  Eyes:     Conjunctiva/sclera: Conjunctivae normal.     Pupils: Pupils are equal, round, and reactive to light.  Neck:     Thyroid: No thyromegaly.     Vascular: No JVD.     Trachea: No tracheal deviation.  Cardiovascular:     Rate and Rhythm: Normal rate and regular rhythm.     Heart sounds: No murmur.  Pulmonary:     Effort: Pulmonary effort is normal. No respiratory distress.     Breath sounds: Normal breath sounds. No wheezing.  Abdominal:     General: Bowel sounds are normal. There is no distension.     Palpations: Abdomen is soft.     Tenderness: There is no abdominal tenderness.  Musculoskeletal:     Right lower leg: No edema.     Left lower leg: No edema.  Lymphadenopathy:     Cervical: No cervical adenopathy.  Skin:    General: Skin is warm and dry.  Neurological:     General: No focal deficit present.     Mental Status: He is alert. Mental status is at baseline.      Comments: Oriented x 2  Psychiatric:        Mood and Affect: Mood normal.     Labs reviewed: Recent Labs    08/07/18 0600 08/14/18 0600 08/23/18 0000  NA 141 139 139  K 4.6 4.5 4.0  BUN 17 19 19   CREATININE 0.8 1.1 1.0   No results for input(s): AST, ALT, ALKPHOS, BILITOT, PROT, ALBUMIN in the last 8760 hours. Recent Labs    08/07/18 0600  WBC 6.7  HGB 14.6  HCT 43  PLT 206   Lab Results  Component Value Date   TSH 1.649 04/06/2018   Lab Results  Component Value Date   HGBA1C 5.5 04/06/2018   Lab Results  Component Value Date   CHOL 160 04/07/2018   HDL 47 04/07/2018   LDLCALC 89 04/07/2018   TRIG 122 04/07/2018   CHOLHDL 3.4 04/07/2018    Significant Diagnostic Results in last 30 days:  No results found.  Assessment/Plan  1. Essential hypertension DBP over 90 numerous times Increase Avapro 300 mg qd  BMP 1 week   2. Cough Per the staff he had a cough x1, however, the resident feels he has had it for a month which may be related to his dementia. He would like something for throat clearing so I ordered Claritin 10 mg qd.  His lungs are clear and has a normal HEENT exam. I have asked the staff to monitor him for cough, fever, or other issues and they will let me know if its a problem.  3. Vascular dementia without behavioral disturbance ([employer]) MMSE - Mini Mental State Exam 03/18/2019  Orientation to time 3  Orientation to Place 5  Registration 3  Attention/ Calculation 0  Recall 1  Language- name 2 objects 2  Language- repeat 1  Language- follow 3 step command 2  Language- read & follow direction 1  Write a sentence 1  Copy design 0  Total score 19   Progressive with associated hx of CVA.  Seems to have adjusted well to the skilled unit  4. Anxiety state Has periods of anxiety in the afternoon. May consider changing meds if this continues with his visit  next month. Continue Celexa 20 mg qd and xanax 0.25 mg bid prn  5. Incontinence of  feces with fecal urgency Improved with move to skilled care and working with OT Continue toileting program and Benefiber powder

## 2019-06-06 ENCOUNTER — Encounter: Payer: Self-pay | Admitting: Adult Health

## 2019-06-09 DIAGNOSIS — F0150 Vascular dementia without behavioral disturbance: Secondary | ICD-10-CM | POA: Diagnosis not present

## 2019-06-09 DIAGNOSIS — M6389 Disorders of muscle in diseases classified elsewhere, multiple sites: Secondary | ICD-10-CM | POA: Diagnosis not present

## 2019-06-09 DIAGNOSIS — R278 Other lack of coordination: Secondary | ICD-10-CM | POA: Diagnosis not present

## 2019-06-09 DIAGNOSIS — R2689 Other abnormalities of gait and mobility: Secondary | ICD-10-CM | POA: Diagnosis not present

## 2019-06-09 DIAGNOSIS — N39498 Other specified urinary incontinence: Secondary | ICD-10-CM | POA: Diagnosis not present

## 2019-06-09 DIAGNOSIS — R296 Repeated falls: Secondary | ICD-10-CM | POA: Diagnosis not present

## 2019-06-11 DIAGNOSIS — D649 Anemia, unspecified: Secondary | ICD-10-CM | POA: Diagnosis not present

## 2019-06-11 DIAGNOSIS — Z79899 Other long term (current) drug therapy: Secondary | ICD-10-CM | POA: Diagnosis not present

## 2019-06-11 LAB — COMPREHENSIVE METABOLIC PANEL: Calcium: 10.2 (ref 8.7–10.7)

## 2019-06-11 LAB — BASIC METABOLIC PANEL
BUN: 11 (ref 4–21)
CO2: 25 — AB (ref 13–22)
Chloride: 101 (ref 99–108)
Creatinine: 0.9 (ref 0.6–1.3)
Glucose: 67
Potassium: 4.8 (ref 3.4–5.3)
Sodium: 140 (ref 137–147)

## 2019-06-12 ENCOUNTER — Encounter: Payer: Self-pay | Admitting: Internal Medicine

## 2019-06-12 DIAGNOSIS — M6389 Disorders of muscle in diseases classified elsewhere, multiple sites: Secondary | ICD-10-CM | POA: Diagnosis not present

## 2019-06-12 DIAGNOSIS — N39498 Other specified urinary incontinence: Secondary | ICD-10-CM | POA: Diagnosis not present

## 2019-06-12 DIAGNOSIS — R296 Repeated falls: Secondary | ICD-10-CM | POA: Diagnosis not present

## 2019-06-12 DIAGNOSIS — F0150 Vascular dementia without behavioral disturbance: Secondary | ICD-10-CM | POA: Diagnosis not present

## 2019-06-12 DIAGNOSIS — R2689 Other abnormalities of gait and mobility: Secondary | ICD-10-CM | POA: Diagnosis not present

## 2019-06-12 DIAGNOSIS — R278 Other lack of coordination: Secondary | ICD-10-CM | POA: Diagnosis not present

## 2019-06-16 DIAGNOSIS — F0150 Vascular dementia without behavioral disturbance: Secondary | ICD-10-CM | POA: Diagnosis not present

## 2019-06-16 DIAGNOSIS — R278 Other lack of coordination: Secondary | ICD-10-CM | POA: Diagnosis not present

## 2019-06-16 DIAGNOSIS — R2689 Other abnormalities of gait and mobility: Secondary | ICD-10-CM | POA: Diagnosis not present

## 2019-06-16 DIAGNOSIS — M6389 Disorders of muscle in diseases classified elsewhere, multiple sites: Secondary | ICD-10-CM | POA: Diagnosis not present

## 2019-06-16 DIAGNOSIS — R296 Repeated falls: Secondary | ICD-10-CM | POA: Diagnosis not present

## 2019-06-16 DIAGNOSIS — N39498 Other specified urinary incontinence: Secondary | ICD-10-CM | POA: Diagnosis not present

## 2019-06-18 DIAGNOSIS — N39498 Other specified urinary incontinence: Secondary | ICD-10-CM | POA: Diagnosis not present

## 2019-06-18 DIAGNOSIS — R2689 Other abnormalities of gait and mobility: Secondary | ICD-10-CM | POA: Diagnosis not present

## 2019-06-18 DIAGNOSIS — F0150 Vascular dementia without behavioral disturbance: Secondary | ICD-10-CM | POA: Diagnosis not present

## 2019-06-18 DIAGNOSIS — M6389 Disorders of muscle in diseases classified elsewhere, multiple sites: Secondary | ICD-10-CM | POA: Diagnosis not present

## 2019-06-18 DIAGNOSIS — R278 Other lack of coordination: Secondary | ICD-10-CM | POA: Diagnosis not present

## 2019-06-18 DIAGNOSIS — R296 Repeated falls: Secondary | ICD-10-CM | POA: Diagnosis not present

## 2019-06-23 DIAGNOSIS — R296 Repeated falls: Secondary | ICD-10-CM | POA: Diagnosis not present

## 2019-06-23 DIAGNOSIS — M6389 Disorders of muscle in diseases classified elsewhere, multiple sites: Secondary | ICD-10-CM | POA: Diagnosis not present

## 2019-06-23 DIAGNOSIS — R4189 Other symptoms and signs involving cognitive functions and awareness: Secondary | ICD-10-CM | POA: Diagnosis not present

## 2019-06-23 DIAGNOSIS — N39498 Other specified urinary incontinence: Secondary | ICD-10-CM | POA: Diagnosis not present

## 2019-06-23 DIAGNOSIS — R2689 Other abnormalities of gait and mobility: Secondary | ICD-10-CM | POA: Diagnosis not present

## 2019-06-23 DIAGNOSIS — R278 Other lack of coordination: Secondary | ICD-10-CM | POA: Diagnosis not present

## 2019-06-27 DIAGNOSIS — R278 Other lack of coordination: Secondary | ICD-10-CM | POA: Diagnosis not present

## 2019-06-27 DIAGNOSIS — M6389 Disorders of muscle in diseases classified elsewhere, multiple sites: Secondary | ICD-10-CM | POA: Diagnosis not present

## 2019-06-27 DIAGNOSIS — N39498 Other specified urinary incontinence: Secondary | ICD-10-CM | POA: Diagnosis not present

## 2019-06-27 DIAGNOSIS — R296 Repeated falls: Secondary | ICD-10-CM | POA: Diagnosis not present

## 2019-06-27 DIAGNOSIS — R4189 Other symptoms and signs involving cognitive functions and awareness: Secondary | ICD-10-CM | POA: Diagnosis not present

## 2019-06-27 DIAGNOSIS — R2689 Other abnormalities of gait and mobility: Secondary | ICD-10-CM | POA: Diagnosis not present

## 2019-06-30 ENCOUNTER — Telehealth: Payer: Self-pay | Admitting: Internal Medicine

## 2019-06-30 NOTE — Telephone Encounter (Signed)
Via fax sent to update that pt is scheduled for Audiology appt 07/09/19.  [place], [place]  Thanks, Vilinda Blanks

## 2019-06-30 NOTE — Telephone Encounter (Signed)
Thanks.  He now lives in SNF at [facility] permanently so they need to be aware of his appointment there.

## 2019-07-01 DIAGNOSIS — M79671 Pain in right foot: Secondary | ICD-10-CM | POA: Diagnosis not present

## 2019-07-01 DIAGNOSIS — M6389 Disorders of muscle in diseases classified elsewhere, multiple sites: Secondary | ICD-10-CM | POA: Diagnosis not present

## 2019-07-01 DIAGNOSIS — B351 Tinea unguium: Secondary | ICD-10-CM | POA: Diagnosis not present

## 2019-07-01 DIAGNOSIS — M79672 Pain in left foot: Secondary | ICD-10-CM | POA: Diagnosis not present

## 2019-07-01 DIAGNOSIS — R278 Other lack of coordination: Secondary | ICD-10-CM | POA: Diagnosis not present

## 2019-07-01 DIAGNOSIS — N39498 Other specified urinary incontinence: Secondary | ICD-10-CM | POA: Diagnosis not present

## 2019-07-01 DIAGNOSIS — R2689 Other abnormalities of gait and mobility: Secondary | ICD-10-CM | POA: Diagnosis not present

## 2019-07-01 DIAGNOSIS — R4189 Other symptoms and signs involving cognitive functions and awareness: Secondary | ICD-10-CM | POA: Diagnosis not present

## 2019-07-01 DIAGNOSIS — R296 Repeated falls: Secondary | ICD-10-CM | POA: Diagnosis not present

## 2019-07-03 ENCOUNTER — Non-Acute Institutional Stay (SKILLED_NURSING_FACILITY): Payer: Medicare Other | Admitting: Adult Health

## 2019-07-03 DIAGNOSIS — K219 Gastro-esophageal reflux disease without esophagitis: Secondary | ICD-10-CM | POA: Diagnosis not present

## 2019-07-03 DIAGNOSIS — F411 Generalized anxiety disorder: Secondary | ICD-10-CM | POA: Diagnosis not present

## 2019-07-03 DIAGNOSIS — I10 Essential (primary) hypertension: Secondary | ICD-10-CM

## 2019-07-03 DIAGNOSIS — R152 Fecal urgency: Secondary | ICD-10-CM | POA: Diagnosis not present

## 2019-07-03 DIAGNOSIS — R159 Full incontinence of feces: Secondary | ICD-10-CM | POA: Diagnosis not present

## 2019-07-03 DIAGNOSIS — F0150 Vascular dementia without behavioral disturbance: Secondary | ICD-10-CM

## 2019-07-04 ENCOUNTER — Encounter: Payer: Self-pay | Admitting: Adult Health

## 2019-07-04 DIAGNOSIS — M6389 Disorders of muscle in diseases classified elsewhere, multiple sites: Secondary | ICD-10-CM | POA: Diagnosis not present

## 2019-07-04 DIAGNOSIS — R2689 Other abnormalities of gait and mobility: Secondary | ICD-10-CM | POA: Diagnosis not present

## 2019-07-04 DIAGNOSIS — R4189 Other symptoms and signs involving cognitive functions and awareness: Secondary | ICD-10-CM | POA: Diagnosis not present

## 2019-07-04 DIAGNOSIS — R296 Repeated falls: Secondary | ICD-10-CM | POA: Diagnosis not present

## 2019-07-04 DIAGNOSIS — N39498 Other specified urinary incontinence: Secondary | ICD-10-CM | POA: Diagnosis not present

## 2019-07-04 DIAGNOSIS — R278 Other lack of coordination: Secondary | ICD-10-CM | POA: Diagnosis not present

## 2019-07-04 NOTE — Progress Notes (Signed)
Location:  Occupational psychologist of Service:  SNF (31) Provider:   Cindi Carbon, ANP [place] [phone]   Gayland Curry, DO  Patient Care Team: Gayland Curry, DO as PCP - General (Geriatric Medicine)  Extended Emergency Contact Information Primary Emergency Contact: Calandra,[name] Address: [address]          [place] [address] [name] of [place] Phone: [phone] Relation: Spouse Secondary Emergency Contact: [name] Mobile Phone: [phone] Relation: Nephew  Code Status:  Full code Goals of care: Advanced Directive information Advanced Directives 05/13/2019  Does Patient Have a Medical Advance Directive? Yes  Type of Advance Directive [place]  Does patient want to make changes to medical advance directive? No - Patient declined  Copy of [place] in Chart? -  Would patient like information on creating a medical advance directive? -     Chief Complaint  Patient presents with  . Medical Management of Chronic Issues    HPI:  Pt is a 86 y.o. male seen today for medical management of chronic diseases.    Anxiety: continues to periodically need prn xanax for feelings of panic but for my visit today he denies any feelings of anxiety or lack of sleep. Appetite is good.   HTN: improved   GERD: was using prn tums for reflux each afternoon, prilosec was added and symptoms improved per nurse  Vascular dementia: with prior hx of CVA.  Spends most of the day in the [facility] but remains able to feed himself, communicate, and follow commands.   Incontinence: improved with less looses stools.   Past Medical History:  Diagnosis Date  . Abnormal gait   . Adenocarcinoma of prostate ([place])   . Anxiety   . Arthritis    hands  . Balance problem   . Benzodiazepine dependence ([place])   . Blood transfusion without reported diagnosis    pt thinks had blood trnsfusion at prostate ca  surgery   . Blurred vision   . Carotid atherosclerosis   . Cataract   . Depression, major   . Glaucoma   . Hearing loss   . High cholesterol   . Hypertension   . Knee pain, bilateral   . Lacunar infarction ([place])   . Lumbar radiculopathy   . Ophthalmic herpes zoster   . Prostate cancer ([place])   . Sacroiliitis ([place])   . Vertigo    Past Surgical History:  Procedure Laterality Date  . COLONOSCOPY    . HERNIA REPAIR    . PROSTATECTOMY      Allergies  Allergen Reactions  . Rofecoxib Swelling    REACTION: swelling- Vioxx name brand     Outpatient Encounter Medications as of 07/03/2019  Medication Sig  . ipratropium-albuterol (DUONEB) 0.5-2.5 (3) MG/3ML SOLN Take 3 mLs by nebulization every 6 (six) hours as needed.  Marland Kitchen omeprazole (PRILOSEC) 20 MG capsule Take 20 mg by mouth daily.  Marland Kitchen acetaminophen (TYLENOL) 325 MG tablet Take 650 mg by mouth every 4 (four) hours as needed for mild pain.  Marland Kitchen ALPRAZolam (XANAX) 0.25 MG tablet Take 1 tablet (0.25 mg total) by mouth 2 (two) times daily as needed for anxiety or sleep.  Marland Kitchen atorvastatin (LIPITOR) 20 MG tablet Take 1 tablet (20 mg total) by mouth daily at 6 PM.  . calcium carbonate (TUMS - DOSED IN MG ELEMENTAL CALCIUM) 500 MG chewable tablet Chew 2 tablets by mouth as needed for indigestion.  Marland Kitchen  cholestyramine (QUESTRAN) 4 g packet Take 4 g by mouth 2 (two) times daily.   . citalopram (CELEXA) 20 MG tablet Take 1 tablet (20 mg total) by mouth daily.  . clopidogrel (PLAVIX) 75 MG tablet Take 1 tablet (75 mg total) by mouth daily.  . irbesartan (AVAPRO) 300 MG tablet Take 300 mg by mouth daily.  Marland Kitchen latanoprost (XALATAN) 0.005 % ophthalmic solution Place 1 drop into the right eye at bedtime.   . lidocaine (LIDODERM) 5 % Place 1 patch onto the skin daily. Remove & Discard patch within 12 hours or as directed by MD  . nystatin (NYSTATIN) powder Apply 1 application topically 3 (three) times daily.  . polyethylene glycol (MIRALAX / GLYCOLAX) packet  Take 17 g by mouth daily as needed for mild constipation.  . Probiotic Product (ALIGN PO) Take 1 capsule by mouth daily.  . Wheat Dextrin (BENEFIBER) POWD Take 2 Scoops by mouth daily.  . [DISCONTINUED] loratadine (CLARITIN) 10 MG tablet Take 10 mg by mouth daily.  . [DISCONTINUED] trolamine salicylate (ASPERCREME) 10 % cream Apply 1 application topically daily.    No facility-administered encounter medications on file as of 07/03/2019.    Review of Systems  Constitutional: Negative for activity change, appetite change, chills, diaphoresis, fatigue, fever and unexpected weight change.  Respiratory: Positive for shortness of breath (with anxiety not exertion). Negative for cough (reports cough but nurse has not heard), wheezing and stridor.   Cardiovascular: Negative for chest pain, palpitations and leg swelling.  Gastrointestinal: Negative for abdominal distention, abdominal pain, constipation and diarrhea.  Genitourinary: Negative for difficulty urinating and dysuria.  Musculoskeletal: Positive for gait problem. Negative for arthralgias, back pain, joint swelling and myalgias.  Neurological: Negative for dizziness, seizures, syncope, facial asymmetry, speech difficulty, weakness and headaches.  Hematological: Negative for adenopathy. Does not bruise/bleed easily.  Psychiatric/Behavioral: Positive for confusion. Negative for agitation and behavioral problems. The patient is nervous/anxious.     Immunization History  Administered Date(s) Administered  . Influenza, High Dose Seasonal PF 01/04/2017  . Influenza-Unspecified 12/10/2017, 01/16/2019  . Moderna SARS-COVID-2 Vaccination 04/11/2019, 04/29/2019  . Pneumococcal Conjugate-13 01/30/2014  . Pneumococcal Polysaccharide-23 11/13/2006  . Tdap 11/15/2007, 01/22/2019  . Zoster 11/15/2007   Pertinent  Health Maintenance Due  Topic Date Due  . INFLUENZA VACCINE  10/19/2019  . PNA vac Low Risk Adult  Completed   Fall Risk  05/07/2019  03/18/2019 10/23/2018 05/30/2018  Falls in the past year? 0 1 1 1   Number falls in past yr: 0 0 1 0  Injury with Fall? 0 1 0 -  Risk for fall due to : - Impaired balance/gait;History of fall(s);Impaired vision - -  Follow up - Falls evaluation completed;Falls prevention discussed - -   Functional Status Survey:    Vitals:   07/04/19 1305  Weight: 179 lb (81.2 kg)   Body mass index is 23.62 kg/m.  Wt Readings from Last 3 Encounters:  07/04/19 179 lb (81.2 kg)  06/06/19 181 lb 9.6 oz (82.4 kg)  05/13/19 167 lb 3.2 oz (75.8 kg)   Physical Exam Vitals and nursing note reviewed.  Constitutional:      General: He is not in acute distress.    Appearance: He is not diaphoretic.  HENT:     Head: Normocephalic and atraumatic.  Neck:     Thyroid: No thyromegaly.     Vascular: No JVD.     Trachea: No tracheal deviation.  Cardiovascular:     Rate and Rhythm: Normal rate  and regular rhythm.     Heart sounds: No murmur.  Pulmonary:     Effort: Pulmonary effort is normal. No respiratory distress.     Breath sounds: Normal breath sounds. No wheezing.  Abdominal:     General: Bowel sounds are normal. There is no distension.     Palpations: Abdomen is soft.     Tenderness: There is no abdominal tenderness.  Musculoskeletal:     Right lower leg: No edema.     Left lower leg: No edema.  Lymphadenopathy:     Cervical: No cervical adenopathy.  Skin:    General: Skin is warm and dry.  Neurological:     General: No focal deficit present.     Mental Status: He is alert. Mental status is at baseline.     Comments: Oriented x 2  Psychiatric:        Mood and Affect: Mood normal.     Labs reviewed: Recent Labs    08/14/18 0600 08/23/18 0000 06/11/19 0000 06/11/19 0900  NA 139 139 140  --   K 4.5 4.0 4.8  --   CL  --   --  101  --   CO2  --   --  25*  --   BUN 19 19 11   --   CREATININE 1.1 1.0 0.9  --   CALCIUM  --   --   --  10.2   No results for input(s): AST, ALT, ALKPHOS,  BILITOT, PROT, ALBUMIN in the last 8760 hours. Recent Labs    08/07/18 0600  WBC 6.7  HGB 14.6  HCT 43  PLT 206   Lab Results  Component Value Date   TSH 1.649 04/06/2018   Lab Results  Component Value Date   HGBA1C 5.5 04/06/2018   Lab Results  Component Value Date   CHOL 160 04/07/2018   HDL 47 04/07/2018   LDLCALC 89 04/07/2018   TRIG 122 04/07/2018   CHOLHDL 3.4 04/07/2018    Significant Diagnostic Results in last 30 days:  No results found.  Assessment/Plan  1. Vascular dementia without behavioral disturbance (HCC) Progressive cognitive and functional losses over the past year which led to his skilled care stay. He has adjusted well to his environment.   2. Gastroesophageal reflux disease without esophagitis Improved with prilosec 20 mg qd   3. Essential hypertension Improved with avapro 300 mg qd   4. Anxiety state Continues with periods of anxiety, would continue celexa 20 mg qd and prn xanax  5. Incontinence of feces with fecal urgency Improvement in this area with less loose stools since he moved to skilled care Continue Benefiber and Questran

## 2019-07-07 DIAGNOSIS — R278 Other lack of coordination: Secondary | ICD-10-CM | POA: Diagnosis not present

## 2019-07-07 DIAGNOSIS — R2689 Other abnormalities of gait and mobility: Secondary | ICD-10-CM | POA: Diagnosis not present

## 2019-07-07 DIAGNOSIS — M6389 Disorders of muscle in diseases classified elsewhere, multiple sites: Secondary | ICD-10-CM | POA: Diagnosis not present

## 2019-07-07 DIAGNOSIS — R296 Repeated falls: Secondary | ICD-10-CM | POA: Diagnosis not present

## 2019-07-07 DIAGNOSIS — N39498 Other specified urinary incontinence: Secondary | ICD-10-CM | POA: Diagnosis not present

## 2019-07-07 DIAGNOSIS — R4189 Other symptoms and signs involving cognitive functions and awareness: Secondary | ICD-10-CM | POA: Diagnosis not present

## 2019-07-09 ENCOUNTER — Encounter: Payer: Self-pay | Admitting: Internal Medicine

## 2019-07-09 DIAGNOSIS — H903 Sensorineural hearing loss, bilateral: Secondary | ICD-10-CM | POA: Diagnosis not present

## 2019-07-14 ENCOUNTER — Encounter: Payer: Self-pay | Admitting: Internal Medicine

## 2019-07-14 ENCOUNTER — Encounter: Payer: Self-pay | Admitting: Adult Health

## 2019-07-14 ENCOUNTER — Non-Acute Institutional Stay (SKILLED_NURSING_FACILITY): Payer: Medicare Other | Admitting: Adult Health

## 2019-07-14 DIAGNOSIS — E039 Hypothyroidism, unspecified: Secondary | ICD-10-CM | POA: Diagnosis not present

## 2019-07-14 DIAGNOSIS — R0600 Dyspnea, unspecified: Secondary | ICD-10-CM | POA: Diagnosis not present

## 2019-07-14 DIAGNOSIS — F419 Anxiety disorder, unspecified: Secondary | ICD-10-CM | POA: Diagnosis not present

## 2019-07-14 DIAGNOSIS — K449 Diaphragmatic hernia without obstruction or gangrene: Secondary | ICD-10-CM | POA: Diagnosis not present

## 2019-07-14 DIAGNOSIS — R0602 Shortness of breath: Secondary | ICD-10-CM | POA: Diagnosis not present

## 2019-07-14 DIAGNOSIS — D649 Anemia, unspecified: Secondary | ICD-10-CM | POA: Diagnosis not present

## 2019-07-14 DIAGNOSIS — K219 Gastro-esophageal reflux disease without esophagitis: Secondary | ICD-10-CM | POA: Diagnosis not present

## 2019-07-14 DIAGNOSIS — R0609 Other forms of dyspnea: Secondary | ICD-10-CM

## 2019-07-14 DIAGNOSIS — Z79899 Other long term (current) drug therapy: Secondary | ICD-10-CM | POA: Diagnosis not present

## 2019-07-14 LAB — BASIC METABOLIC PANEL
BUN: 15 (ref 4–21)
CO2: 21 (ref 13–22)
Chloride: 102 (ref 99–108)
Creatinine: 0.9 (ref 0.6–1.3)
Glucose: 149
Potassium: 4.1 (ref 3.4–5.3)
Sodium: 138 (ref 137–147)

## 2019-07-14 LAB — HEPATIC FUNCTION PANEL
ALT: 10 (ref 10–40)
AST: 14 (ref 14–40)

## 2019-07-14 LAB — POCT B-TYPE NATRIURETIC PEPTIDE (BNP): Pro B Natriuretic peptide (BNP): 66

## 2019-07-14 LAB — CBC AND DIFFERENTIAL
HCT: 43 (ref 41–53)
Hemoglobin: 14.4 (ref 13.5–17.5)
Platelets: 217 (ref 150–399)
WBC: 8.4

## 2019-07-14 LAB — CBC: RBC: 5.07 (ref 3.87–5.11)

## 2019-07-14 LAB — COMPREHENSIVE METABOLIC PANEL
Albumin: 4.2 (ref 3.5–5.0)
Calcium: 10 (ref 8.7–10.7)
Globulin: 2.2

## 2019-07-14 LAB — TSH: TSH: 3.16 (ref 0.41–5.90)

## 2019-07-14 NOTE — Progress Notes (Signed)
Location:  Occupational psychologist of Service:  SNF (31) Provider:   Cindi Carbon, ANP [place] [phone]   Gayland Curry, DO  Patient Care Team: Gayland Curry, DO as PCP - General (Geriatric Medicine)  Extended Emergency Contact Information Primary Emergency Contact: Belgard,[name] Address: [address]          [place] [address] [name] of [place] Phone: [phone] Relation: Spouse Secondary Emergency Contact: [name] Mobile Phone: [phone] Relation: Nephew  Code Status:  Full code  Goals of care: Advanced Directive information Advanced Directives 05/13/2019  Does Patient Have a Medical Advance Directive? Yes  Type of Advance Directive [place]  Does patient want to make changes to medical advance directive? No - Patient declined  Copy of [place] in Chart? -  Would patient like information on creating a medical advance directive? -     Chief Complaint  Patient presents with  . Acute Visit    DOE    HPI:  Pt is a 86 y.o. male seen today for an acute visit for dyspnea on exertion. [name] Duffy resides in skilled care and has underlying memory loss but can contribute to the history. His nurse wrote a note indicating that on 4/25 in the evening [name] Duffy experienced feeling short of breath and wheezing on exertion. A duoneb was given and oxygen was provided for comfort. His sats were normal but HR was elevated. He also received one dose of xanax and the symptoms resolved. He has a hx of anxiety and has an prn order for xanax. He reports that he feels anxious at times about living in skilled care and his health. The notes indicate on two instances where the xanax was used for anxiety, mostly it is used for sleep at night only and not during the day. He has reported DOE periodically in the past few months when walking in his room or to an activity. He spends most of the  time in a wheelchair in his room or unit. He does not have any chest pain. He reports indigestion in the evening which he describes as burning but states that taking prilosec has helped. For my visit today he is comfortable and sitting in his chair with no distress with no chest pain or sob. In interviewing the staff they state the DOE can occur during the day or at night but seems to bring on the anxiety.    He is vaccinated for covid and does not have any sore throat, cough, runny nose, head ache, fatigue or body aches.   MR cardiac morphology reviewed from 04/10/18 which indicated a normal EF, normal left and right ventricle and normal left atrial size with trivial mitral and tricuspid regurg.   CXR reviewed from 04/06/18 showed large paraesophageal hernia with left base atx and elevation of the left hemidiaphragm.    Past Medical History:  Diagnosis Date  . Abnormal gait   . Adenocarcinoma of prostate ([place])   . Anxiety   . Arthritis    hands  . Balance problem   . Benzodiazepine dependence ([employer])   . Blood transfusion without reported diagnosis    pt thinks had blood trnsfusion at prostate ca surgery   . Blurred vision   . Carotid atherosclerosis   . Cataract   . Depression, major   . Glaucoma   . Hearing loss   . High cholesterol   . Hypertension   .  Knee pain, bilateral   . Lacunar infarction ([place])   . Lumbar radiculopathy   . Ophthalmic herpes zoster   . Prostate cancer ([place])   . Sacroiliitis ([place])   . Vertigo    Past Surgical History:  Procedure Laterality Date  . COLONOSCOPY    . HERNIA REPAIR    . PROSTATECTOMY      Allergies  Allergen Reactions  . Rofecoxib Swelling    REACTION: swelling- Vioxx name brand     Outpatient Encounter Medications as of 07/14/2019  Medication Sig  . acetaminophen (TYLENOL) 325 MG tablet Take 650 mg by mouth every 4 (four) hours as needed for mild pain.  Marland Kitchen ALPRAZolam (XANAX) 0.25 MG tablet Take 1 tablet (0.25 mg total) by mouth 2  (two) times daily as needed for anxiety or sleep.  Marland Kitchen atorvastatin (LIPITOR) 20 MG tablet Take 1 tablet (20 mg total) by mouth daily at 6 PM.  . calcium carbonate (TUMS - DOSED IN MG ELEMENTAL CALCIUM) 500 MG chewable tablet Chew 2 tablets by mouth as needed for indigestion.  . cholestyramine (QUESTRAN) 4 g packet Take 4 g by mouth 2 (two) times daily.   . citalopram (CELEXA) 20 MG tablet Take 1 tablet (20 mg total) by mouth daily.  . clopidogrel (PLAVIX) 75 MG tablet Take 1 tablet (75 mg total) by mouth daily.  Marland Kitchen ipratropium-albuterol (DUONEB) 0.5-2.5 (3) MG/3ML SOLN Take 3 mLs by nebulization every 6 (six) hours as needed.  . irbesartan (AVAPRO) 300 MG tablet Take 300 mg by mouth daily.  Marland Kitchen latanoprost (XALATAN) 0.005 % ophthalmic solution Place 1 drop into the right eye at bedtime.   . lidocaine (LIDODERM) 5 % Place 1 patch onto the skin daily. Remove & Discard patch within 12 hours or as directed by MD  . nystatin (NYSTATIN) powder Apply 1 application topically 3 (three) times daily.  Marland Kitchen omeprazole (PRILOSEC) 20 MG capsule Take 20 mg by mouth daily.  . polyethylene glycol (MIRALAX / GLYCOLAX) packet Take 17 g by mouth daily as needed for mild constipation.  . Probiotic Product (ALIGN PO) Take 1 capsule by mouth daily.  . Wheat Dextrin (BENEFIBER) POWD Take 2 Scoops by mouth daily.   No facility-administered encounter medications on file as of 07/14/2019.    Review of Systems  Constitutional: Negative for activity change, appetite change, chills, diaphoresis, fatigue, fever and unexpected weight change.  HENT: Negative for congestion, postnasal drip and sore throat.   Respiratory: Positive for shortness of breath (on exertion ). Negative for cough, wheezing (on exertion ) and stridor.   Cardiovascular: Negative for chest pain, palpitations and leg swelling.  Gastrointestinal: Negative for abdominal distention, abdominal pain, constipation and diarrhea.       Gerd  Genitourinary: Negative for  difficulty urinating and dysuria.  Musculoskeletal: Positive for arthralgias and gait problem. Negative for back pain, joint swelling and myalgias.  Skin: Negative for rash and wound.  Neurological: Negative for dizziness, seizures, syncope, facial asymmetry, speech difficulty, weakness and headaches.  Hematological: Negative for adenopathy. Does not bruise/bleed easily.  Psychiatric/Behavioral: Positive for confusion. Negative for agitation, behavioral problems, dysphoric mood, self-injury, sleep disturbance and suicidal ideas. The patient is nervous/anxious.     Immunization History  Administered Date(s) Administered  . Influenza, High Dose Seasonal PF 01/04/2017  . Influenza-Unspecified 12/10/2017, 01/16/2019  . Moderna SARS-COVID-2 Vaccination 04/11/2019, 04/29/2019  . Pneumococcal Conjugate-13 01/30/2014  . Pneumococcal Polysaccharide-23 11/13/2006  . Tdap 11/15/2007, 01/22/2019  . Zoster 11/15/2007   Pertinent  Health Maintenance  Due  Topic Date Due  . INFLUENZA VACCINE  10/19/2019  . PNA vac Low Risk Adult  Completed   Fall Risk  05/07/2019 03/18/2019 10/23/2018 05/30/2018  Falls in the past year? 0 1 1 1   Number falls in past yr: 0 0 1 0  Injury with Fall? 0 1 0 -  Risk for fall due to : - Impaired balance/gait;History of fall(s);Impaired vision - -  Follow up - Falls evaluation completed;Falls prevention discussed - -   Functional Status Survey:    Vitals:   07/14/19 1202  BP: (!) 144/89  Pulse: 86  Resp: 19  Temp: 97.9 F (36.6 C)  SpO2: 96%  Weight: 184 lb (83.5 kg)   Body mass index is 24.28 kg/m. Physical Exam Vitals and nursing note reviewed.  Constitutional:      General: He is not in acute distress.    Appearance: He is not diaphoretic.  HENT:     Head: Normocephalic and atraumatic.     Nose: Nose normal. No congestion.     Mouth/Throat:     Mouth: Mucous membranes are moist.     Pharynx: Oropharynx is clear.  Eyes:     Conjunctiva/sclera:  Conjunctivae normal.     Pupils: Pupils are equal, round, and reactive to light.  Neck:     Thyroid: No thyromegaly.     Vascular: No carotid bruit or JVD.     Trachea: No tracheal deviation.  Cardiovascular:     Rate and Rhythm: Normal rate and regular rhythm.     Heart sounds: No murmur.  Pulmonary:     Effort: Pulmonary effort is normal. No respiratory distress.     Breath sounds: Normal breath sounds. No wheezing.  Abdominal:     General: Bowel sounds are normal. There is no distension.     Palpations: Abdomen is soft.     Tenderness: There is no abdominal tenderness.  Musculoskeletal:     Cervical back: No rigidity or tenderness.     Right lower leg: No edema.     Left lower leg: No edema.  Lymphadenopathy:     Cervical: No cervical adenopathy.  Skin:    General: Skin is warm and dry.  Neurological:     General: No focal deficit present.     Mental Status: He is alert and oriented to person, place, and time. Mental status is at baseline.     Cranial Nerves: No cranial nerve deficit.     Comments: Oriented x 2. MAE   Psychiatric:        Mood and Affect: Mood normal.     Labs reviewed: Recent Labs    08/14/18 0600 08/23/18 0000 06/11/19 0000 06/11/19 0900  NA 139 139 140  --   K 4.5 4.0 4.8  --   CL  --   --  101  --   CO2  --   --  25*  --   BUN 19 19 11   --   CREATININE 1.1 1.0 0.9  --   CALCIUM  --   --   --  10.2   No results for input(s): AST, ALT, ALKPHOS, BILITOT, PROT, ALBUMIN in the last 8760 hours. Recent Labs    08/07/18 0600  WBC 6.7  HGB 14.6  HCT 43  PLT 206   Lab Results  Component Value Date   TSH 1.649 04/06/2018   Lab Results  Component Value Date   HGBA1C 5.5 04/06/2018   Lab Results  Component Value Date   CHOL 160 04/07/2018   HDL 47 04/07/2018   LDLCALC 89 04/07/2018   TRIG 122 04/07/2018   CHOLHDL 3.4 04/07/2018    Significant Diagnostic Results in last 30 days:  No results found.  Assessment/Plan 1. Dyspnea on  exertion Seems to have a predictable pattern with no chest pain.  No hx of smoking or COPD. MRI was normal in 2020.  Will need CXR and labs to further differentiate. Seems comfortable a this time. Would avoid over exertion at this time.   2. Paraesophageal hernia Noted to be large on CXR in 2020, ? If this contributes to his symptoms due to restrictive physiology  3. Gastroesophageal reflux disease without esophagitis Improved with prilosec 20 mg qd   4. Anxiety  There is a predictable pattern of dyspnea related to activity which may spur on his anxiety. We will continue the xanax 0.25 mg bid prn and attempt to identify the underlying cause first.   Family/ staff Communication: discussed with the resident and his nurse.   Labs/tests ordered:  CBC CMP TSH BNP CXR

## 2019-07-15 ENCOUNTER — Telehealth: Payer: Self-pay | Admitting: Adult Health

## 2019-07-15 DIAGNOSIS — R0609 Other forms of dyspnea: Secondary | ICD-10-CM

## 2019-07-15 DIAGNOSIS — F419 Anxiety disorder, unspecified: Secondary | ICD-10-CM

## 2019-07-15 DIAGNOSIS — K219 Gastro-esophageal reflux disease without esophagitis: Secondary | ICD-10-CM

## 2019-07-15 DIAGNOSIS — K449 Diaphragmatic hernia without obstruction or gangrene: Secondary | ICD-10-CM | POA: Insufficient documentation

## 2019-07-15 NOTE — Telephone Encounter (Signed)
Pt was seen on 4/26 due to [place]. CXR (4/26) returned with no acute process however, large paraesophageal hernia was noted. BNP was WNL, as well as CBC and BMP. [name] has a hx of anxiety, along with gerd.  His symptoms of DOE seem to be worse in the evening with dinner. He was walked last evening for 5 minutes and his sats stayed around 96% but he did experience some SOB at the end (no wheezing was noted).  He is deconditioned and with the hernia in his chest these two things are likely the reason for his sob, which later spurs on his anxiety. I spoke with Collie Siad at [facility] and she will set him up for a Modified Barium swallow with Esophogram. Will also order pepcid 20 mg qhs and schedule the xanax each evening as well to prevent further issues.

## 2019-07-16 ENCOUNTER — Other Ambulatory Visit (HOSPITAL_COMMUNITY): Payer: Self-pay | Admitting: Internal Medicine

## 2019-07-16 ENCOUNTER — Other Ambulatory Visit: Payer: Self-pay | Admitting: Internal Medicine

## 2019-07-16 DIAGNOSIS — K449 Diaphragmatic hernia without obstruction or gangrene: Secondary | ICD-10-CM

## 2019-07-16 DIAGNOSIS — K219 Gastro-esophageal reflux disease without esophagitis: Secondary | ICD-10-CM

## 2019-07-22 ENCOUNTER — Other Ambulatory Visit (HOSPITAL_COMMUNITY): Payer: Self-pay | Admitting: Internal Medicine

## 2019-07-22 ENCOUNTER — Other Ambulatory Visit: Payer: Self-pay

## 2019-07-22 ENCOUNTER — Ambulatory Visit (HOSPITAL_COMMUNITY)
Admission: RE | Admit: 2019-07-22 | Discharge: 2019-07-22 | Disposition: A | Discharge status: Home / Self Care | Payer: Medicare Other | Source: Ambulatory Visit | Attending: Internal Medicine | Admitting: Internal Medicine

## 2019-07-22 DIAGNOSIS — K449 Diaphragmatic hernia without obstruction or gangrene: Secondary | ICD-10-CM | POA: Diagnosis not present

## 2019-07-22 DIAGNOSIS — K219 Gastro-esophageal reflux disease without esophagitis: Secondary | ICD-10-CM

## 2019-07-24 ENCOUNTER — Encounter: Payer: Self-pay | Admitting: Adult Health

## 2019-07-24 ENCOUNTER — Non-Acute Institutional Stay (SKILLED_NURSING_FACILITY): Payer: Medicare Other | Admitting: Adult Health

## 2019-07-24 DIAGNOSIS — F411 Generalized anxiety disorder: Secondary | ICD-10-CM

## 2019-07-24 DIAGNOSIS — R0600 Dyspnea, unspecified: Secondary | ICD-10-CM | POA: Diagnosis not present

## 2019-07-24 DIAGNOSIS — K219 Gastro-esophageal reflux disease without esophagitis: Secondary | ICD-10-CM

## 2019-07-24 DIAGNOSIS — K449 Diaphragmatic hernia without obstruction or gangrene: Secondary | ICD-10-CM

## 2019-07-24 DIAGNOSIS — Z7189 Other specified counseling: Secondary | ICD-10-CM | POA: Diagnosis not present

## 2019-07-24 DIAGNOSIS — R0609 Other forms of dyspnea: Secondary | ICD-10-CM

## 2019-07-24 NOTE — Progress Notes (Signed)
Location:  Occupational psychologist of Service:  SNF (31) Provider:   Cindi Duffy, ANP [name] Duffy [phone]   [name] Curry, DO  Patient Care Team: [name] Curry, DO as PCP - General (Geriatric Medicine)  Extended Emergency Contact Information Primary Emergency Contact: [name] Duffy,[name] Duffy Address: [address]          [place] [address] [name] Phone: [phone] Relation: Spouse Secondary Emergency Contact: [name] Duffy Mobile Phone: [phone] Relation: Nephew  Code Status:  Full code  Goals of care: Advanced Directive information Advanced Directives 07/24/2019  Does Patient Have a Medical Advance Directive? Yes  Type of Paramedic of [place];Living will  Does patient want to make changes to medical advance directive? No - Patient declined  Copy of [place] in Chart? Yes - validated most recent copy scanned in chart (See row information)  Would patient like information on creating a medical advance directive? -     Chief Complaint  Patient presents with  . Acute Visit    f/u esophogram and advanced care planning     HPI:  Pt is a 86 y.o. male seen today for an acute visit for f/u regarding esophogram results and advanced care planning.  [name] Duffy has a hx of CVA and has progressive vascular dementia. His hearing has been worsening as well. Today he is wearing new hearing aides for our visit but they have not helped with our communication likely due to his cognitive issues.  For the past two months he has had a predictable pattern of dyspnea on exertion and wheezing. This seems to be confounded by underlying anxiety. These symptoms occur typically in the evening and tend to improve with a breathing treatment and xanax. He is also experiencing feeling of indigestion and burning.  A CXR and BNP were ordered and have shown no signs of CHF or acute lung issues. An  esophogram was ordered which confirms a large paraesophageal hernia present since 2005. The entire stomach, pylorus, duodenal bulb are within the chest.  He is now in skilled care and is weaker and requires more assistance but continues to be able to walk with a walker to exercise and to dinner. Longer distances he uses a WC.   He is currently a full code. He had a bout of DOE on 5/1 and there was concern that he may need to go to the hospital if it did not resolve but he declined evaluation. This prompted the staff to question changing his status to DNR. This has been addressed in the past by Dr. Mariea Clonts and at that time he wanted to stay a full code.    Past Medical History:  Diagnosis Date  . Abnormal gait   . Adenocarcinoma of prostate ([place])   . Anxiety   . Arthritis    hands  . Balance problem   . Benzodiazepine dependence ([place])   . Blood transfusion without reported diagnosis    pt thinks had blood trnsfusion at prostate ca surgery   . Blurred vision   . Carotid atherosclerosis   . Cataract   . Depression, major   . Glaucoma   . Hearing loss   . High cholesterol   . Hypertension   . Knee pain, bilateral   . Lacunar infarction ([place])   . Lumbar radiculopathy   . Ophthalmic herpes zoster   . Prostate cancer ([place])   . Sacroiliitis ([place])   . Vertigo  Past Surgical History:  Procedure Laterality Date  . COLONOSCOPY    . HERNIA REPAIR    . PROSTATECTOMY      Allergies  Allergen Reactions  . Rofecoxib Swelling    REACTION: swelling- Vioxx name brand     Outpatient Encounter Medications as of 07/24/2019  Medication Sig  . escitalopram (LEXAPRO) 10 MG tablet Take 5 mg by mouth daily. X 1 week then increase to 10 mg qd  . pantoprazole (PROTONIX) 40 MG tablet Take 40 mg by mouth daily.  Marland Kitchen acetaminophen (TYLENOL) 325 MG tablet Take 650 mg by mouth every 4 (four) hours as needed for mild pain.  Marland Kitchen ALPRAZolam (XANAX) 0.25 MG tablet Take 1 tablet (0.25 mg total) by mouth 2 (two)  times daily as needed for anxiety or sleep.  Marland Kitchen ALPRAZolam (XANAX) 0.25 MG tablet Take 0.25 mg by mouth every evening.  Marland Kitchen atorvastatin (LIPITOR) 20 MG tablet Take 1 tablet (20 mg total) by mouth daily at 6 PM.  . calcium carbonate (TUMS - DOSED IN MG ELEMENTAL CALCIUM) 500 MG chewable tablet Chew 2 tablets by mouth as needed for indigestion.  . cholestyramine (QUESTRAN) 4 g packet Take 4 g by mouth 2 (two) times daily.   . citalopram (CELEXA) 20 MG tablet Take 1 tablet (20 mg total) by mouth daily. (Patient taking differently: Take 10 mg by mouth daily. Decrease to 10 mg qd x 1 week then d/c)  . clopidogrel (PLAVIX) 75 MG tablet Take 1 tablet (75 mg total) by mouth daily.  . famotidine (PEPCID) 20 MG tablet Take 20 mg by mouth at bedtime.  Marland Kitchen ipratropium-albuterol (DUONEB) 0.5-2.5 (3) MG/3ML SOLN Take 3 mLs by nebulization every 6 (six) hours as needed.  . irbesartan (AVAPRO) 300 MG tablet Take 300 mg by mouth daily.  Marland Kitchen latanoprost (XALATAN) 0.005 % ophthalmic solution Place 1 drop into the right eye at bedtime.   . lidocaine (LIDODERM) 5 % Place 1 patch onto the skin daily. Remove & Discard patch within 12 hours or as directed by MD  . nystatin (NYSTATIN) powder Apply 1 application topically 3 (three) times daily.  . polyethylene glycol (MIRALAX / GLYCOLAX) packet Take 17 g by mouth daily as needed for mild constipation.  . Probiotic Product (ALIGN PO) Take 1 capsule by mouth daily.  . Wheat Dextrin (BENEFIBER) POWD Take 2 Scoops by mouth daily.  . [DISCONTINUED] omeprazole (PRILOSEC) 20 MG capsule Take 20 mg by mouth daily.   No facility-administered encounter medications on file as of 07/24/2019.    Review of Systems  Constitutional: Negative for activity change, appetite change, chills, diaphoresis, fatigue, fever and unexpected weight change.  HENT: Positive for hearing loss.   Respiratory: Positive for shortness of breath and wheezing. Negative for cough and stridor.   Cardiovascular:  Negative for chest pain, palpitations and leg swelling.  Gastrointestinal: Negative for abdominal distention, abdominal pain, constipation and diarrhea.       Indigestion  Genitourinary: Negative for difficulty urinating and dysuria.  Musculoskeletal: Positive for gait problem. Negative for arthralgias, back pain, joint swelling and myalgias.  Skin: Negative for wound.  Neurological: Positive for weakness (generalized). Negative for dizziness, seizures, syncope, facial asymmetry, speech difficulty and headaches.  Hematological: Negative for adenopathy. Does not bruise/bleed easily.  Psychiatric/Behavioral: Positive for confusion. Negative for agitation, behavioral problems, self-injury, sleep disturbance and suicidal ideas. The patient is nervous/anxious.     Immunization History  Administered Date(s) Administered  . Influenza, High Dose Seasonal PF 01/04/2017  . Influenza-Unspecified  12/10/2017, 01/16/2019  . Moderna SARS-COVID-2 Vaccination 04/11/2019, 04/29/2019  . Pneumococcal Conjugate-13 01/30/2014  . Pneumococcal Polysaccharide-23 11/13/2006  . Tdap 11/15/2007, 01/22/2019  . Zoster 11/15/2007   Pertinent  Health Maintenance Due  Topic Date Due  . INFLUENZA VACCINE  10/19/2019  . PNA vac Low Risk Adult  Completed   Fall Risk  05/07/2019 03/18/2019 10/23/2018 05/30/2018  Falls in the past year? 0 1 1 1   Number falls in past yr: 0 0 1 0  Injury with Fall? 0 1 0 -  Risk for fall due to : - Impaired balance/gait;History of fall(s);Impaired vision - -  Follow up - Falls evaluation completed;Falls prevention discussed - -   Functional Status Survey:    There were no vitals filed for this visit. There is no height or weight on file to calculate BMI. Physical Exam Vitals and nursing note reviewed.  Constitutional:      General: He is not in acute distress.    Appearance: He is not diaphoretic.  HENT:     Head: Normocephalic and atraumatic.     Nose: Nose normal. No congestion.       Mouth/Throat:     Mouth: Mucous membranes are moist.     Pharynx: Oropharynx is clear. No oropharyngeal exudate.  Neck:     Thyroid: No thyromegaly.     Vascular: No JVD.     Trachea: No tracheal deviation.  Cardiovascular:     Rate and Rhythm: Normal rate and regular rhythm.     Heart sounds: No murmur.  Pulmonary:     Effort: Pulmonary effort is normal. No respiratory distress.     Breath sounds: Normal breath sounds. No wheezing.  Abdominal:     General: Bowel sounds are normal. There is no distension.     Palpations: Abdomen is soft.     Tenderness: There is no abdominal tenderness.  Musculoskeletal:     Right lower leg: No edema.     Left lower leg: No edema.     Comments: Kyphotic posture  Lymphadenopathy:     Cervical: No cervical adenopathy.  Skin:    General: Skin is warm and dry.  Neurological:     General: No focal deficit present.     Mental Status: He is alert. Mental status is at baseline.     Comments: Oriented to self, place, and time. Forgetful of the details of his care.   Psychiatric:        Mood and Affect: Mood normal.     Labs reviewed: Recent Labs    08/14/18 0600 08/23/18 0000 06/11/19 0000 06/11/19 0900  NA 139 139 140  --   K 4.5 4.0 4.8  --   CL  --   --  101  --   CO2  --   --  25*  --   BUN 19 19 11   --   CREATININE 1.1 1.0 0.9  --   CALCIUM  --   --   --  10.2   No results for input(s): AST, ALT, ALKPHOS, BILITOT, PROT, ALBUMIN in the last 8760 hours. Recent Labs    08/07/18 0600  WBC 6.7  HGB 14.6  HCT 43  PLT 206   Lab Results  Component Value Date   TSH 1.649 04/06/2018   Lab Results  Component Value Date   HGBA1C 5.5 04/06/2018   Lab Results  Component Value Date   CHOL 160 04/07/2018   HDL 47 04/07/2018   LDLCALC 89  04/07/2018   TRIG 122 04/07/2018   CHOLHDL 3.4 04/07/2018    Significant Diagnostic Results in last 30 days:  DG ESOPHAGUS W SINGLE CM (SOL OR THIN BA)  Result Date: 07/22/2019 CLINICAL  DATA:  Gastroesophageal reflux disease. Paraesophageal hernia. EXAM: ESOPHOGRAM/BARIUM SWALLOW TECHNIQUE: Single contrast examination was performed using  thin barium. FLUOROSCOPY TIME:  Fluoroscopy Time:  1 minutes 48 seconds COMPARISON:  CT scans of the abdomen dated 04/06/2003 and 05/03/2005 and chest x-ray dated 04/06/2018 FINDINGS: The mucosa and motility of the esophagus are normal. Patient has a large chronic paraesophageal hernia. The entire stomach and the pylorus and duodenal bulb are herniated into the chest. The second portion of the duodenum is at the level of the diaphragm. Portions of the transverse colon and splenic flexure of the colon are also within the hernia. There is no obstruction or mass lesion. The stomach empties readily into the duodenum and proximal small bowel. There is no stricture. No appreciable esophagitis. This large hernia has been present since 2005. Aortic atherosclerosis. IMPRESSION: 1. Large chronic paraesophageal hernia, present since 2005. The entire stomach and the pylorus and duodenal bulb are herniated into the chest. 2. Portions of the transverse colon and splenic flexure of the colon are also within the hernia. 3. No evidence of obstruction or stricture or other acute abnormality. 4.  Aortic Atherosclerosis (ICD10-I70.0). Electronically Signed   By: Lorriane Shire M.D.   On: 07/22/2019 10:08    Assessment/Plan 1. Paraesophageal hernia Large and notable on CXR and esophagram. Contributes to his symptoms have DOE and GERD. Discussed with Dr. Mariea Clonts. We do not feel he is a candidate for surgery due to his advanced age and progressive dementia  2. Gastroesophageal reflux disease without esophagitis D/C Prilosec. Begin Protonix 40 mg qd and continue Pepcid 20 mg qhs indefinitely   3. DOE (dyspnea on exertion) Due to restrictive lung physiology with a large hernia and kyphosis, along with debility and GERD. No evidence of CHF or COPD. Add Duonebs BID x  1 month.    4. Anxiety state Taper Celexa and begin Lexapro 5 mg qd x 1 week, then increase to 10 mg qd. Continue xanax each evening and bid prn (would like to taper in the future) F/U in 1 month  5. Advanced care planning I discussed the findings of the esophogram with Mr Duffy as well as the medications changes listed above. They were also discussed with his POA, [name] Duffy. The situation is complex because his wife lives at [place] and feels she should receive all communication and that she is his POA.  This creates tension for [name] Duffy and for the [place]. At this time we agreed that all communication will continue to go through Mr Fatima Duffy because he is the legal HCPOA. But we also agreed to have a staff member at [facility] communicate with his wife so that she feels she is being kept informed as well. In addition, given [name] Duffy's decline and age we recommend a DNR. This was discussed with [name] Duffy and he stated "I will think about it".  Given his dementia this will need to be cleared with [name] Duffy. He plans to sit down and talk with [name] Duffy at a later time about this issue and get back with [place].   Counseling and coordination of care 30 minutes     Family/ staff Communication: discussed with [name] Duffy and his POA [name] Duffy   Labs/tests ordered:  NA

## 2019-07-30 IMAGING — MR MR LUMBAR SPINE W/O CM
4 of 5 series · 18 of 48 positions shown · non-contrast
Comparison: Radiography 10/19/2003

CLINICAL DATA: Fell backwards in the bathtub 2 weeks ago with
second fall several days ago. Mid to lower back pain with difficulty
walking.

EXAM:
MRI LUMBAR SPINE WITHOUT CONTRAST
TECHNIQUE: Multiplanar, multisequence MR imaging of the lumbar spine was
performed. No intravenous contrast was administered.

[Series 3: T1 · sagittal · 4.0mm · 0.53mm/px · 3 of 18 slices shown (1 of 2)]
[im 4/18]
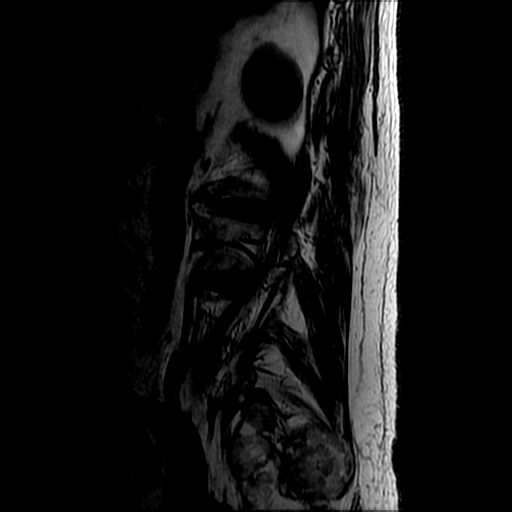
[im 11/18]
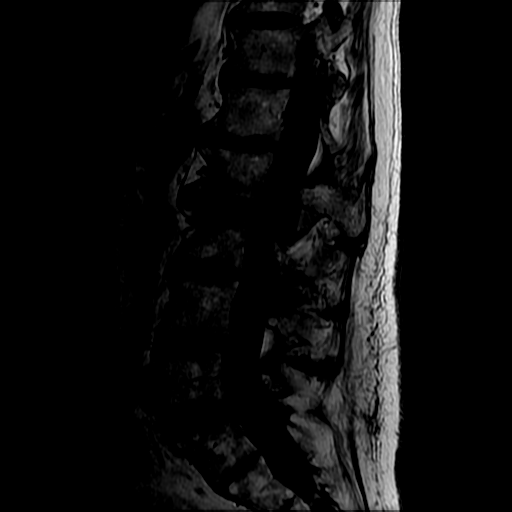
[im 18/18]
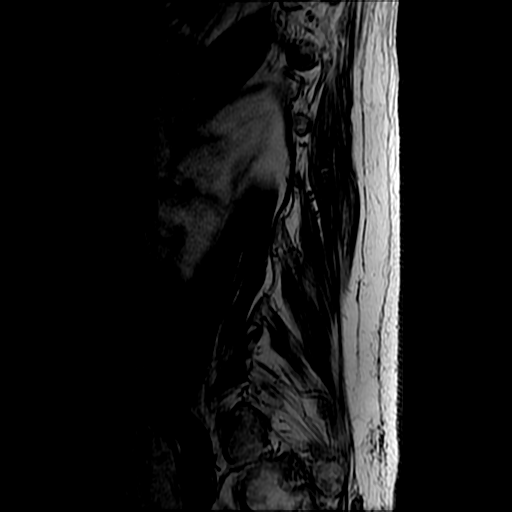

[Series 4: T2 post-contrast · sagittal · 4.0mm · 0.53mm/px · 7 of 18 slices shown]
[im 1/18]
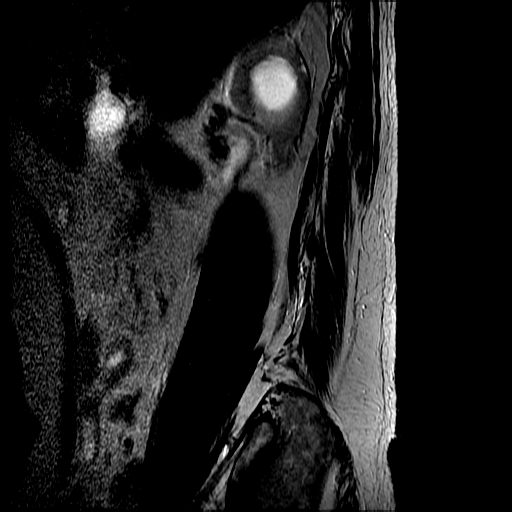
[im 3/18]
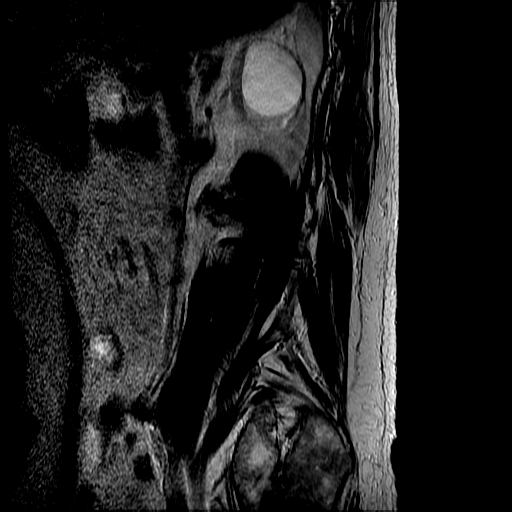
[im 6/18]
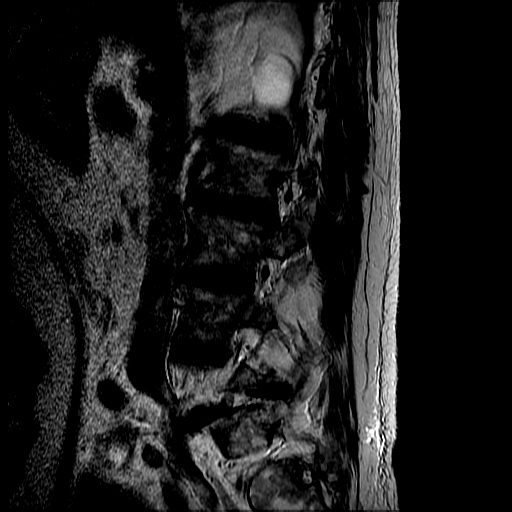
[im 9/18]
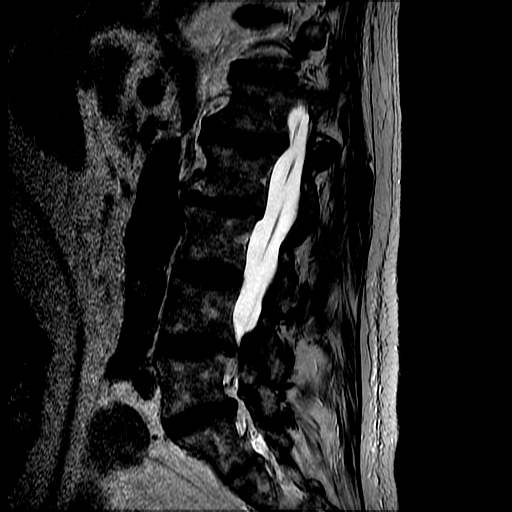
[im 12/18]
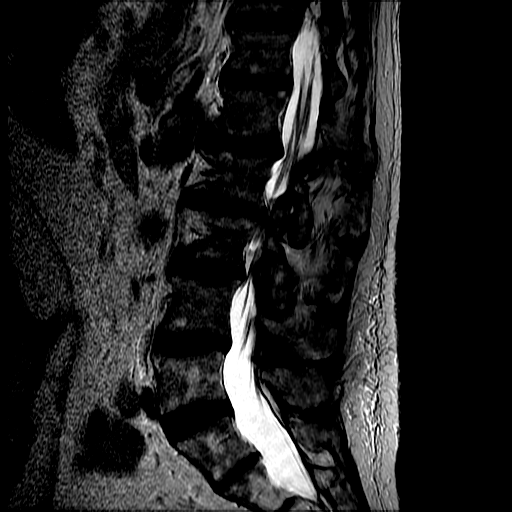
[im 15/18]
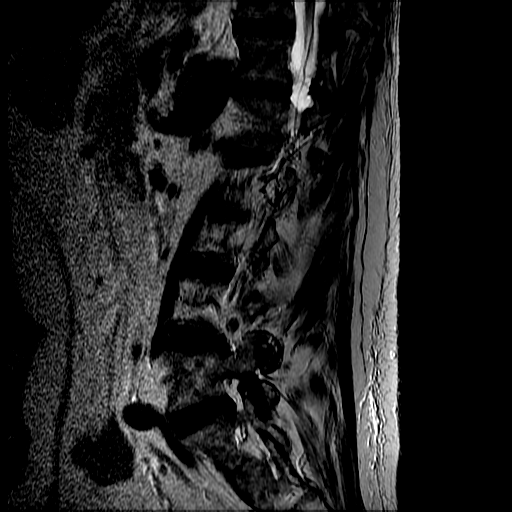
[im 18/18]
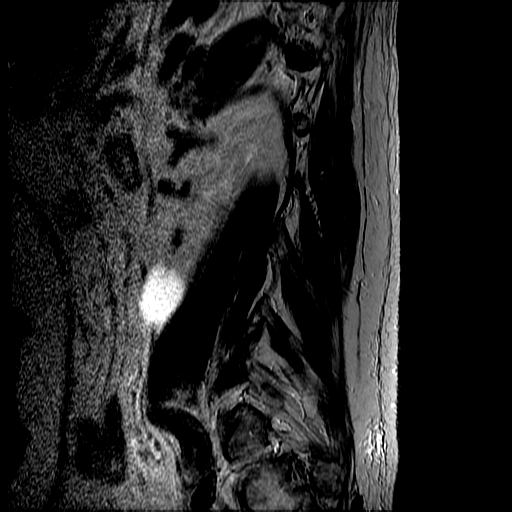

[Series 6: T2 · axial · 4.0mm · 0.41mm/px · z∈[-76,+88]mm · 5 of 35 slices shown]
[im 1/35]
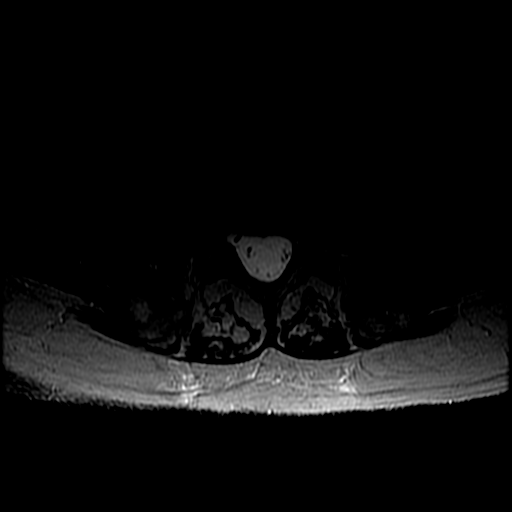
[im 6/35]
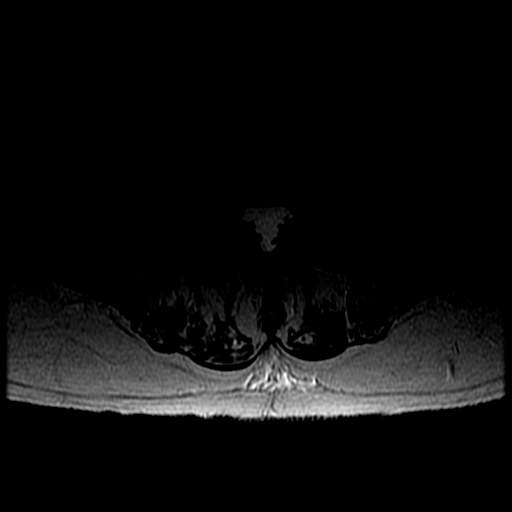
[im 11/35]
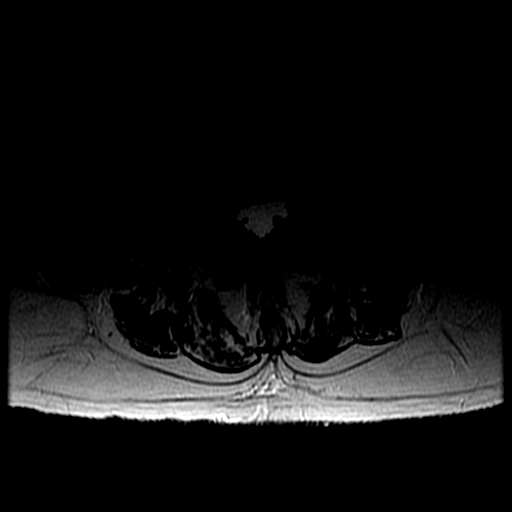
[im 19/35]
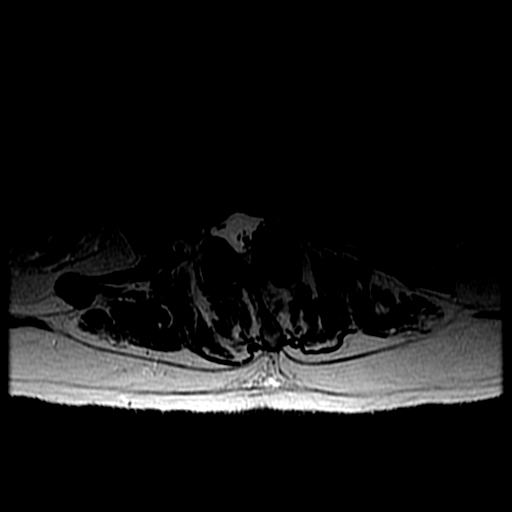
[im 29/35]
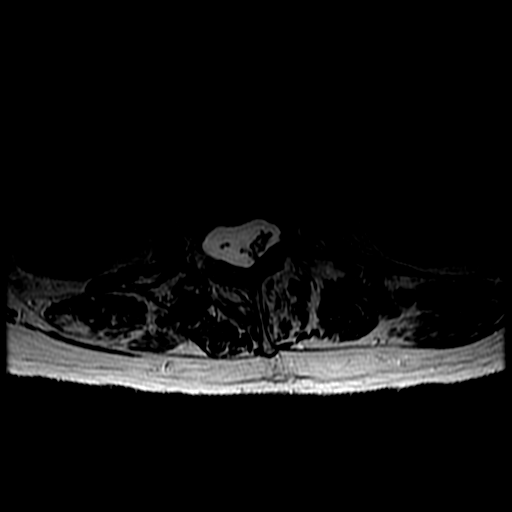

[Series 7: T1 · axial · 4.0mm · 0.41mm/px · z∈[-51,+88]mm · 3 of 35 slices shown (2 of 2)]
[im 6/35]
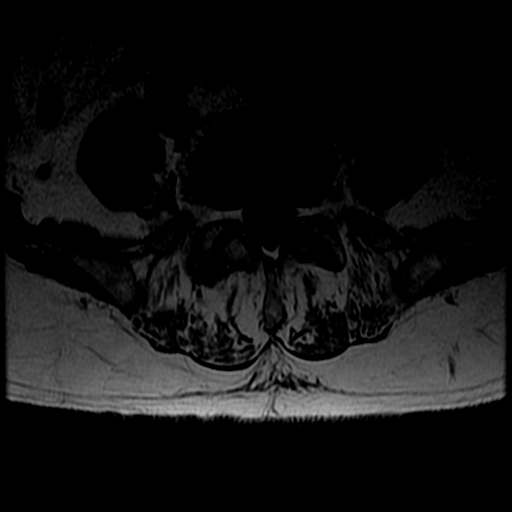
[im 19/35]
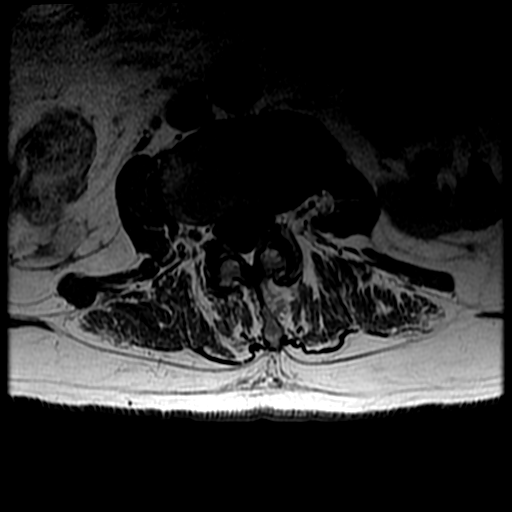
[im 29/35]
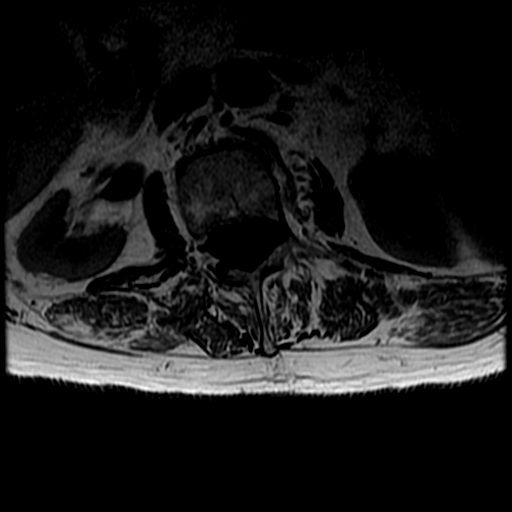

[18 of 48 positions shown; findings below may reference images not displayed]

FINDINGS: Segmentation:  5 lumbar type vertebral bodies.

Alignment:  Curvature convex to the right with the apex at L2-3.

Vertebrae: No acute fracture in the region from inferior endplate
T11 through S2. Discogenic edematous changes of the endplates on the
left at L2-3.

Conus medullaris and cauda equina: Conus extends to the L1 level.
Conus and cauda equina appear normal.

Paraspinal and other soft tissues: Simple appearing renal cysts.

Disc levels:

L1-2: Disc bulge more towards the left. Mild narrowing of left
lateral recess but no likely neural compression.

L2-3: Endplate osteophytes and disc bulge towards the left. Mild
narrowing of the left lateral recess but no likely neural
compression. Discogenic endplate edematous changes could be
associated with back pain.

L3-4: Mild bulging of the disc and facet hypertrophy. Mild left
lateral recess narrowing without visible neural compression.

L4-5: Mild disc bulge.  Mild facet degeneration.  No stenosis.

L5-S1: Mild disc bulge.  Mild facet degeneration.  No stenosis.
IMPRESSION: No acute or traumatic finding. No evidence of fracture in the region
from T11 through S2.

Curvature convex to the right. Left-sided predominant degenerative
disc disease at L1-2, L2-3 and L3-4, with mild narrowing of the
lateral recesses but no definite neural compression. Discogenic
edema the endplates on the left at L2-3 could be associated with
back pain, but this is presumably not acute.

These results will be called to the ordering clinician or
representative by the Radiologist Assistant, and communication
documented in the PACS or zVision Dashboard.

## 2019-07-30 IMAGING — CR DG CERVICAL SPINE COMPLETE 4+V
5 series · 5 of 5 positions shown · non-contrast
Comparison: None.

CLINICAL DATA: Pain following fall

EXAM:
CERVICAL SPINE - COMPLETE 4+ VIEW

[w c-spine lat]
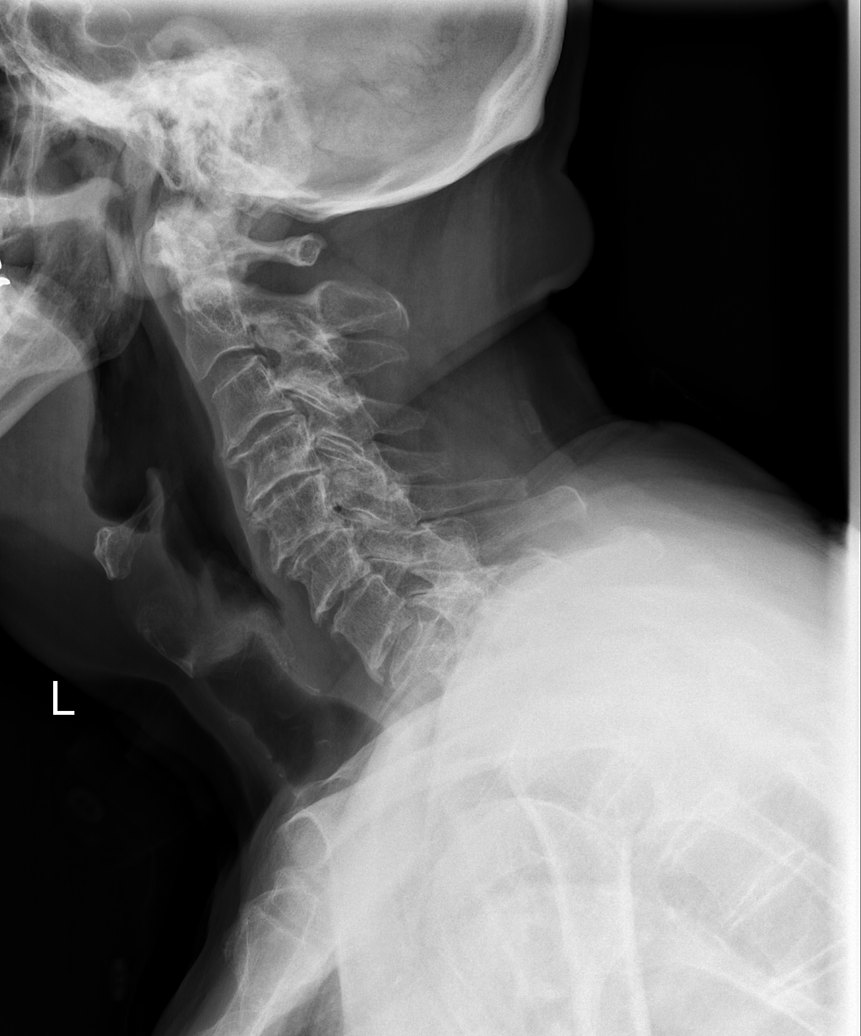

[w c-spine oblique (1 of 2)]
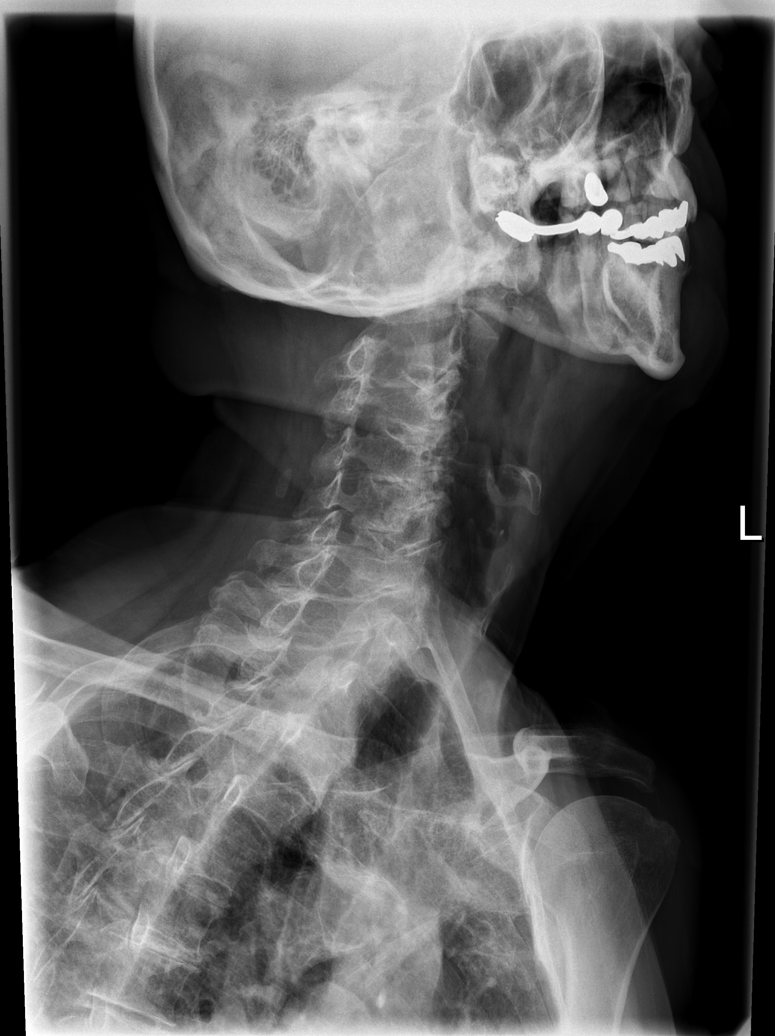

[w c-spine oblique (2 of 2)]
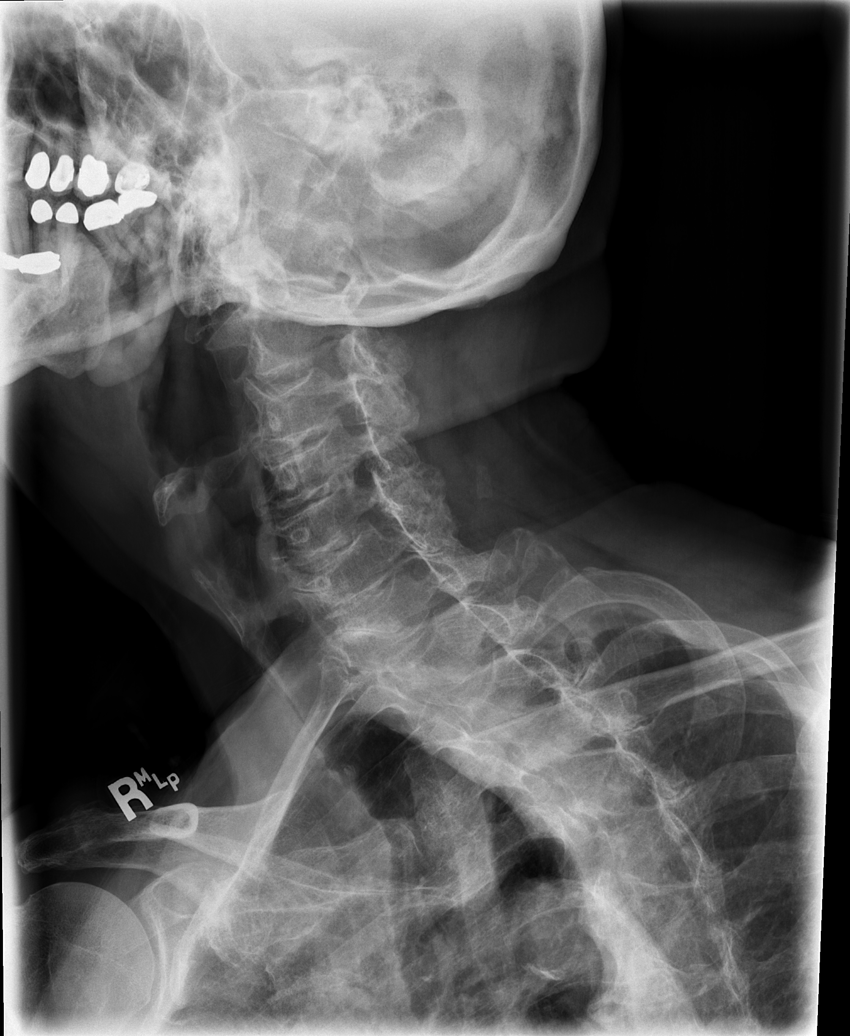

[w c-spine a.p. *]
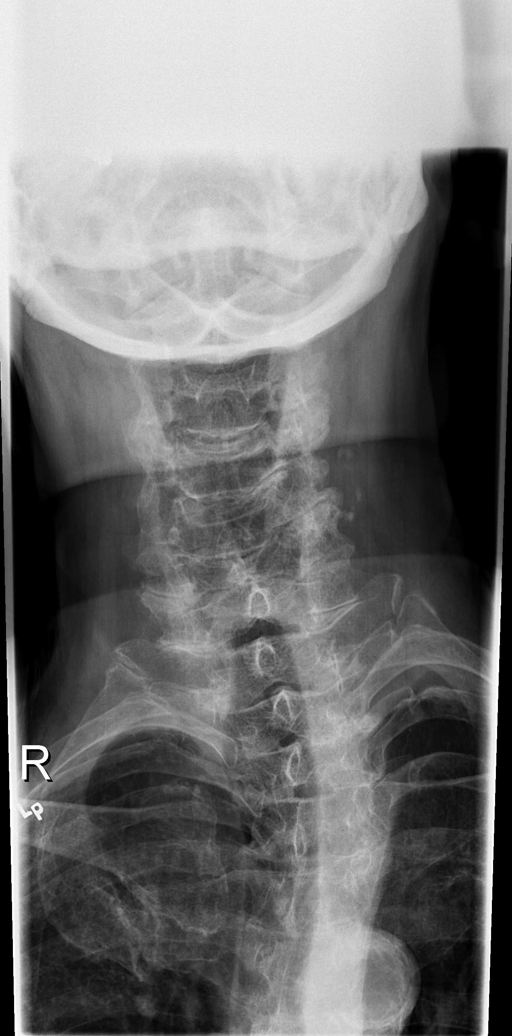

[w c-spine odontoid *]
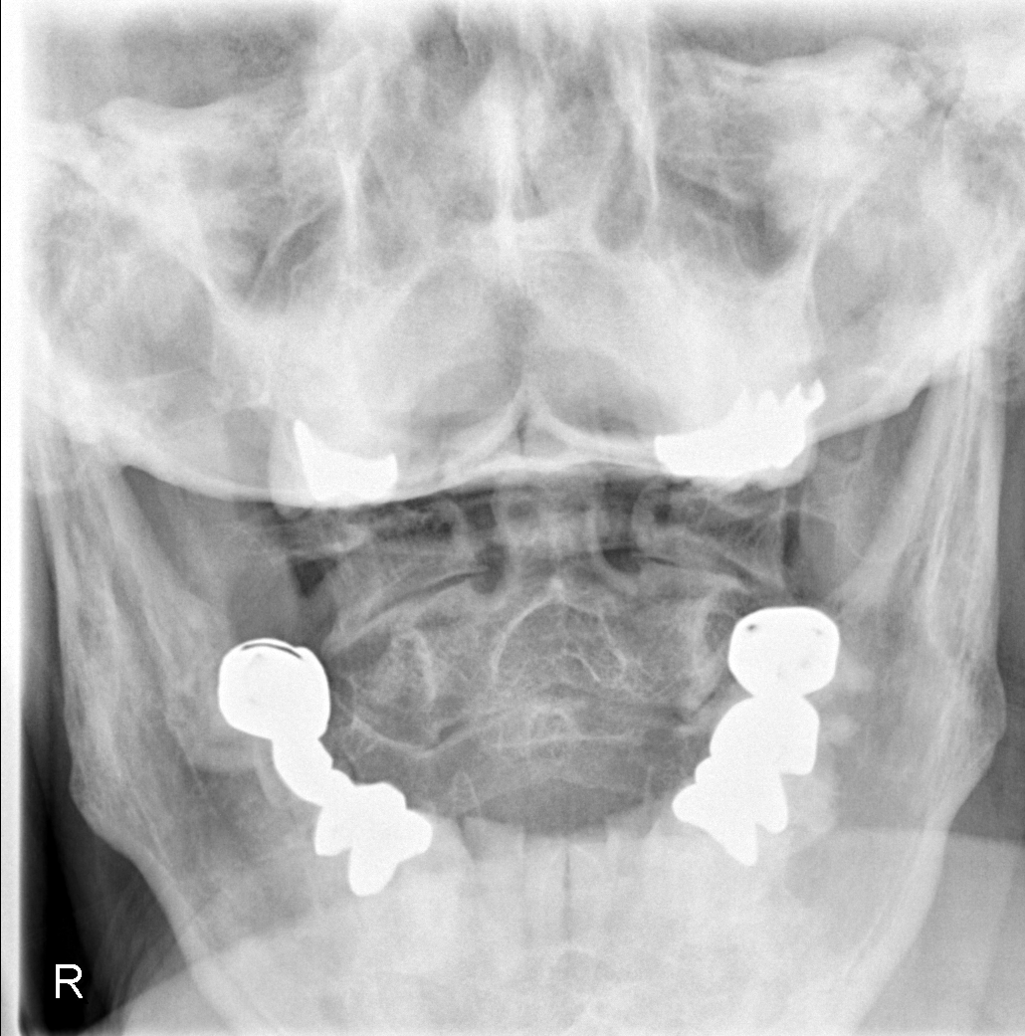

[5 of 5 positions shown; findings below may reference images not displayed]

FINDINGS: Frontal, lateral, open-mouth odontoid, and bilateral oblique views
were obtained. There is cervicothoracic levoscoliosis. There is no
evident fracture or spondylolisthesis. Prevertebral soft tissues and
predental space regions are normal. There is moderately severe disc
space narrowing at C3-4, C4-5, C5-6, C6-7, and C7-T1. Anterior
osteophytes are noted at C3, C4, C5, C6, and C7. There is facet
hypertrophy with exit foraminal narrowing at C3-4, C4-5, and C5-6 on
the left and at C4-5 on the right.

There is aortic atherosclerosis. There is calcification in the left
carotid artery. Lung apices are clear.
IMPRESSION: Multilevel osteoarthritic change. Scoliosis. No fracture or
spondylolisthesis.

There is aortic atherosclerosis. There is calcification in the left
carotid artery.

Aortic Atherosclerosis ([id number]).

## 2019-08-12 ENCOUNTER — Other Ambulatory Visit: Payer: Self-pay | Admitting: Internal Medicine

## 2019-08-12 DIAGNOSIS — F419 Anxiety disorder, unspecified: Secondary | ICD-10-CM

## 2019-08-12 MED ORDER — ALPRAZOLAM 0.25 MG PO TABS: 0.2500 mg | ORAL_TABLET | Freq: Every evening | ORAL | 5 refills | Status: DC

## 2019-08-19 ENCOUNTER — Encounter: Payer: Self-pay | Admitting: Internal Medicine

## 2019-08-19 ENCOUNTER — Non-Acute Institutional Stay (SKILLED_NURSING_FACILITY): Payer: Medicare Other | Admitting: Internal Medicine

## 2019-08-19 DIAGNOSIS — R0600 Dyspnea, unspecified: Secondary | ICD-10-CM | POA: Diagnosis not present

## 2019-08-19 DIAGNOSIS — G9340 Encephalopathy, unspecified: Secondary | ICD-10-CM | POA: Diagnosis not present

## 2019-08-19 DIAGNOSIS — H9113 Presbycusis, bilateral: Secondary | ICD-10-CM

## 2019-08-19 DIAGNOSIS — I10 Essential (primary) hypertension: Secondary | ICD-10-CM | POA: Diagnosis not present

## 2019-08-19 DIAGNOSIS — F0150 Vascular dementia without behavioral disturbance: Secondary | ICD-10-CM | POA: Diagnosis not present

## 2019-08-19 DIAGNOSIS — R159 Full incontinence of feces: Secondary | ICD-10-CM

## 2019-08-19 DIAGNOSIS — R0609 Other forms of dyspnea: Secondary | ICD-10-CM

## 2019-08-19 DIAGNOSIS — R152 Fecal urgency: Secondary | ICD-10-CM

## 2019-08-19 DIAGNOSIS — K449 Diaphragmatic hernia without obstruction or gangrene: Secondary | ICD-10-CM

## 2019-08-19 NOTE — Progress Notes (Signed)
Location:  [facility] Room Number: [id number] Place of Service:   SNF Provider:  Sammi Stolarz L. Mariea Clonts, D.O., C.M.D.  Gayland Curry, DO  Patient Care Team: Gayland Curry, DO as PCP - General (Geriatric Medicine)  Extended Emergency Contact Information Primary Emergency Contact: Spisak,[name] Address: [address]          [place] [address] [name] of [place] Phone: [phone] Relation: Spouse Secondary Emergency Contact: [name] Mobile Phone: [phone] Relation: Nephew  Code Status:  FULL CODE; hcpoa and dpoa on file at [facility] but no copy in epic   Goals of care: Advanced Directive information Advanced Directives 08/19/2019  Does Patient Have a Medical Advance Directive? Yes  Type of Advance Directive [facility]  Does patient want to make changes to medical advance directive? No - Patient declined  Copy of [place] in Chart? Yes - validated most recent copy scanned in chart (See row information)  Would patient like information on creating a medical advance directive? -   Chief Complaint  Patient presents with  . Medical Management of Chronic Issues     Routine Visit     HPI:  Pt is a 86 y.o. male seen today for medical management of chronic diseases.  He has a h/o prior stroke, progressive vascular dementia with behaviors, fecal and urinary incontinence so he recently moved to SNF from [place].    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  He gets anxious and agitated at times.  Nursing notes indicate this.  He has good and bad days.  He's been getting xanax which does seem to calm him but more than likely he's calmed by redirection.    He had a fall 5/11 when he attempted to transfer himself out of his wheelchair and was found lying on the floor, but did not hit his head.  He also had an incident where he had  urinary incontinence when visiting his wife in her AL enhanced apt and he was trying to clean himself up there instead of them calling for assistance.  Fortunately, he did not fall.   His celexa was changed to lexapro in May in hopes it would help his anxiety and irritability but no clear improvement thus far.  Still getting the xanax for agitation and anxiety in afternoons typically. He also gets anxious in context of perceived dyspnea which we are thinking may be due to his hernia occupying lung space.   He was pleasant during our visit though he did not realize who I was at first and quickly forgot that I'd seen him when I passed him later on in the hall.    New hearing aids were being obtained last time--not clear if this has transpired right now--don't see it noted.  Seemed to hear me better today though.  Past Medical History:  Diagnosis Date  . Abnormal gait   . Adenocarcinoma of prostate ([place])   . Anxiety   . Arthritis    hands  . Balance problem   . Benzodiazepine dependence ([place])   . Blood transfusion without reported diagnosis    pt thinks had blood trnsfusion at prostate ca surgery   . Blurred vision   . Carotid atherosclerosis   . Cataract   . Depression, major   . Glaucoma   . Hearing loss   . High cholesterol   . Hypertension   .  Knee pain, bilateral   . Lacunar infarction ([place])   . Lumbar radiculopathy   . Ophthalmic herpes zoster   . Prostate cancer ([place])   . Sacroiliitis ([place])   . Vertigo    Past Surgical History:  Procedure Laterality Date  . COLONOSCOPY    . HERNIA REPAIR    . PROSTATECTOMY      Allergies  Allergen Reactions  . Rofecoxib Swelling    REACTION: swelling- Vioxx name brand     Outpatient Encounter Medications as of 08/19/2019  Medication Sig  . acetaminophen (TYLENOL) 325 MG tablet Take 650 mg by mouth every 4 (four) hours as needed for mild pain.  Marland Kitchen ALPRAZolam (XANAX) 0.25 MG tablet Take 0.25 mg by mouth at bedtime. Special  instructions: Schedule before dinner Q evening  . atorvastatin (LIPITOR) 20 MG tablet Take 1 tablet (20 mg total) by mouth daily at 6 PM.  . calcium carbonate (TUMS - DOSED IN MG ELEMENTAL CALCIUM) 500 MG chewable tablet Chew 2 tablets by mouth as needed for indigestion.  . cholestyramine (QUESTRAN) 4 g packet Take 4 g by mouth 2 (two) times daily.   . clopidogrel (PLAVIX) 75 MG tablet Take 1 tablet (75 mg total) by mouth daily.  Marland Kitchen escitalopram (LEXAPRO) 10 MG tablet Take 5 mg by mouth daily. X 1 week then increase to 10 mg qd  . famotidine (PEPCID) 20 MG tablet Take 20 mg by mouth at bedtime.  Marland Kitchen ipratropium-albuterol (DUONEB) 0.5-2.5 (3) MG/3ML SOLN Take 3 mLs by nebulization every 6 (six) hours as needed.  . irbesartan (AVAPRO) 300 MG tablet Take 300 mg by mouth daily.  Marland Kitchen latanoprost (XALATAN) 0.005 % ophthalmic solution Place 1 drop into the right eye at bedtime.   . lidocaine (LIDODERM) 5 % Place 1 patch onto the skin daily. Remove & Discard patch within 12 hours or as directed by MD  . pantoprazole (PROTONIX) 40 MG tablet Take 40 mg by mouth daily.  . polyethylene glycol (MIRALAX / GLYCOLAX) packet Take 17 g by mouth daily as needed for mild constipation.  . Probiotic Product (ALIGN PO) Take 1 capsule by mouth daily.  . [DISCONTINUED] ALPRAZolam (XANAX) 0.25 MG tablet Take 1 tablet (0.25 mg total) by mouth 2 (two) times daily as needed for anxiety or sleep.  . [DISCONTINUED] ALPRAZolam (XANAX) 0.25 MG tablet Take 1 tablet (0.25 mg total) by mouth every evening.  . [DISCONTINUED] citalopram (CELEXA) 20 MG tablet Take 1 tablet (20 mg total) by mouth daily. (Patient taking differently: Take 10 mg by mouth daily. Decrease to 10 mg qd x 1 week then d/c)  . [DISCONTINUED] nystatin (NYSTATIN) powder Apply 1 application topically 3 (three) times daily.  . [DISCONTINUED] Wheat Dextrin (BENEFIBER) POWD Take 2 Sco[place] by mouth daily.   No facility-administered encounter medications on file as of  08/19/2019.    Review of Systems  Constitutional: Negative for chills, fever and weight loss.  HENT: Positive for hearing loss.   Eyes: Negative for blurred vision.  Respiratory: Positive for shortness of breath. Negative for cough and wheezing.   Cardiovascular: Negative for chest pain, palpitations and leg swelling.  Gastrointestinal: Positive for constipation and diarrhea. Negative for abdominal pain, blood in stool and melena.  Genitourinary: Negative for dysuria.       Leakage  Musculoskeletal: Positive for falls.  Skin: Negative for itching and rash.  Neurological: Positive for weakness. Negative for dizziness and loss of consciousness.  Endo/Heme/Allergies: Bruises/bleeds easily.  Psychiatric/Behavioral: Positive for depression and memory  loss. Negative for hallucinations. The patient is nervous/anxious. The patient does not have insomnia.     Immunization History  Administered Date(s) Administered  . Influenza, High Dose Seasonal PF 01/04/2017  . Influenza-Unspecified 12/10/2017, 01/16/2019  . Moderna SARS-COVID-2 Vaccination 04/11/2019, 04/29/2019  . Pneumococcal Conjugate-13 01/30/2014  . Pneumococcal Polysaccharide-23 11/13/2006  . Tdap 11/15/2007, 01/22/2019  . Zoster 11/15/2007   Pertinent  Health Maintenance Due  Topic Date Due  . INFLUENZA VACCINE  10/19/2019  . PNA vac Low Risk Adult  Completed   Fall Risk  05/07/2019 03/18/2019 10/23/2018 05/30/2018  Falls in the past year? 0 1 1 1   Number falls in past yr: 0 0 1 0  Injury with Fall? 0 1 0 -  Risk for fall due to : - Impaired balance/gait;History of fall(s);Impaired vision - -  Follow up - Falls evaluation completed;Falls prevention discussed - -   Functional Status Survey:    Vitals:   08/19/19 1142  BP: (!) 144/90  Pulse: 100  Temp: (!) 96.9 F (36.1 C)  SpO2: 95%  Weight: 185 lb (83.9 kg)  Height: 6\' 1"  (1.854 m)   Body mass index is 24.41 kg/m. Physical Exam Vitals reviewed.  Constitutional:       General: He is not in acute distress.    Appearance: He is normal weight. He is not ill-appearing or toxic-appearing.  HENT:     Head: Normocephalic and atraumatic.     Ears:     Comments: Hearing aids in  Eyes:     Extraocular Movements: Extraocular movements intact.     Pupils: Pupils are equal, round, and reactive to light.  Cardiovascular:     Rate and Rhythm: Normal rate and regular rhythm.     Pulses: Normal pulses.     Heart sounds: Normal heart sounds.  Pulmonary:     Effort: Pulmonary effort is normal.     Breath sounds: Normal breath sounds. No wheezing, rhonchi or rales.  Abdominal:     General: Bowel sounds are normal.     Palpations: Abdomen is soft.     Tenderness: There is no abdominal tenderness.  Musculoskeletal:        General: Normal range of motion.     Right lower leg: No edema.     Left lower leg: No edema.     Comments: Seated in manual wheelchair  Skin:    General: Skin is warm and dry.     Coloration: Skin is pale.  Neurological:     General: No focal deficit present.     Mental Status: He is alert. Mental status is at baseline.  Psychiatric:        Mood and Affect: Mood normal.        Behavior: Behavior normal.     Labs reviewed: Recent Labs    06/11/19 0000 06/11/19 0900 07/14/19 1200  NA 140  --  138  K 4.8  --  4.1  CL 101  --  102  CO2 25*  --  21  BUN 11  --  15  CREATININE 0.9  --  0.9  CALCIUM  --  10.2 10.0   Recent Labs    07/14/19 1200  AST 14  ALT 10  ALBUMIN 4.2   Recent Labs    07/14/19 1200  WBC 8.4  HGB 14.4  HCT 43  PLT 217   Lab Results  Component Value Date   TSH 3.16 07/14/2019   Lab Results  Component Value  Date   HGBA1C 5.5 04/06/2018   Lab Results  Component Value Date   CHOL 160 04/07/2018   HDL 47 04/07/2018   LDLCALC 89 04/07/2018   TRIG 122 04/07/2018   CHOLHDL 3.4 04/07/2018    Significant Diagnostic Results in last 30 days:  Barium swallow and NP notes reviewed   Assessment/Plan 1. Paraesophageal hernia -cont protonix -contributes to his breathing difficulty  2. DOE (dyspnea on exertion) -seems there is a role of #1 in this, is calmed some at times with nebs and xanax in these instances -repositioning is likely to help, as well  3. Vascular dementia without behavioral disturbance ([place]) -progressing--he is not always recognizing me at this point and has more behavioral issues at this stage with what is perceived as anxiety when he does not understand what is going on with him and he becomes impatient like waiting on his meals or when he is feeling dyspneic due to his hernia -cont same regimen and gentle redirection  4. Essential hypertension -bp control limited by his h/o orthostasis, cont to monitor  5. Incontinence of feces with fecal urgency -chronic and due to advancing dementia and prior stroke, cont adult undergarments and regular peri-care with CNAs  6. Cerebellar dysfunction -using wheelchair in room and receiving snf care -occasionally forgets to call for help and falls doing self-transfers or trying to bathe himself after incontinence  7. Presbycusis of both ears -appears he may have gotten his new hearing aids now as hearing better today   Family/ staff Communication: discussed with snf nurse  Labs/tests ordered:  No new  Derenda Giddings L. Nioka Thorington, D.O. [facility] [address], [place] [address] Cell Phone (Mon-Fri 8am-5pm):  [phone] On Call:  [phone] & follow prompts after 5pm & weekends Office Phone:  [phone] Office Fax:  [phone]

## 2019-09-04 ENCOUNTER — Encounter: Payer: Self-pay | Admitting: Adult Health

## 2019-09-04 ENCOUNTER — Non-Acute Institutional Stay (SKILLED_NURSING_FACILITY): Payer: Medicare Other | Admitting: Adult Health

## 2019-09-04 DIAGNOSIS — R531 Weakness: Secondary | ICD-10-CM | POA: Diagnosis not present

## 2019-09-04 DIAGNOSIS — R310 Gross hematuria: Secondary | ICD-10-CM

## 2019-09-04 DIAGNOSIS — F411 Generalized anxiety disorder: Secondary | ICD-10-CM

## 2019-09-04 NOTE — Progress Notes (Signed)
Location:  [facility] of Service:  SNF (31) Provider:   Cindi Carbon, ANP [facility] [phone]   Gayland Curry, DO  Patient Care Team: Gayland Curry, DO as PCP - General (Geriatric Medicine)  Extended Emergency Contact Information Primary Emergency Contact: Degan,[name] Address: [address]          [place] [address] [name] of [place] Phone: [phone] Relation: Spouse Secondary Emergency Contact: [name] Mobile Phone: [phone] Relation: Nephew  Code Status:  Full code Goals of care: Advanced Directive information Advanced Directives 08/19/2019  Does Patient Have a Medical Advance Directive? Yes  Type of Advance Directive [place]  Does patient want to make changes to medical advance directive? No - Patient declined  Copy of [place] in Chart? Yes - validated most recent copy scanned in chart (See row information)  Would patient like information on creating a medical advance directive? -     Chief Complaint  Patient presents with  . Acute Visit    feeling anxious and sad, "odd" behavior    HPI:  Pt is a 86 y.o. male seen today for an acute visit for feeling anxious, sad, and having odd behavior.   On 6/16 he was found on the floor alert with no injury, apparently he tried to get up without help.   Later in the evening he went to the door with no clothes on and was assisted back inside his room.  The nurse reports increased irritability and moodiness for two days. Crying more often, wanting his wife "right now".  Appears anxious at times. HR 100, BP elevated 162/98.  He was supposed to stop scheduled duonebs on 6/6 but they have continued which may have contributed to his elevated HR. His CNA reports that he has a worsening gait and required assistance with transfers which was a change from his baseline. Upon entering the room for my visit the CNA  reported that he had bloody urine and I observed this in the brief. He denied any bladder pain, dysuria, back pain, fever, etc. Bowel movements are normal.   He was transitioned from Celexa to Lexapro in May due to panic attacks that were uncontrolled. He continues on xanax each evening for this reason as well. Prior to this visit it was felt that he was doing better in terms of mood and symptoms of shortness of breath related to a paraesophageal hernia that he has along with difficult to control anxiety.    Past Medical History:  Diagnosis Date  . Abnormal gait   . Adenocarcinoma of prostate ([place])   . Anxiety   . Arthritis    hands  . Balance problem   . Benzodiazepine dependence ([place])   . Blood transfusion without reported diagnosis    pt thinks had blood trnsfusion at prostate ca surgery   . Blurred vision   . Carotid atherosclerosis   . Cataract   . Depression, major   . Glaucoma   . Hearing loss   . High cholesterol   . Hypertension   . Knee pain, bilateral   . Lacunar infarction ([place])   . Lumbar radiculopathy   . Ophthalmic herpes zoster   . Prostate cancer ([place])   . Sacroiliitis ([place])   . Vertigo    Past Surgical History:  Procedure Laterality Date  . COLONOSCOPY    . HERNIA REPAIR    . PROSTATECTOMY  Allergies  Allergen Reactions  . Rofecoxib Swelling    REACTION: swelling- Vioxx name brand     Outpatient Encounter Medications as of 09/04/2019  Medication Sig  . acetaminophen (TYLENOL) 325 MG tablet Take 650 mg by mouth every 4 (four) hours as needed for mild pain.  Marland Kitchen ALPRAZolam (XANAX) 0.25 MG tablet Take 0.25 mg by mouth at bedtime. Special instructions: Schedule before dinner Q evening  . ALPRAZolam (XANAX) 0.25 MG tablet Take 0.25 mg by mouth 2 (two) times daily as needed for anxiety. X 14 days  . atorvastatin (LIPITOR) 20 MG tablet Take 1 tablet (20 mg total) by mouth daily at 6 PM.  . calcium carbonate (TUMS - DOSED IN MG ELEMENTAL CALCIUM) 500 MG  chewable tablet Chew 2 tablets by mouth as needed for indigestion.  . cholestyramine (QUESTRAN) 4 g packet Take 4 g by mouth 2 (two) times daily.   . clopidogrel (PLAVIX) 75 MG tablet Take 1 tablet (75 mg total) by mouth daily.  Marland Kitchen escitalopram (LEXAPRO) 10 MG tablet Take 5 mg by mouth daily. X 1 week then increase to 10 mg qd  . famotidine (PEPCID) 20 MG tablet Take 20 mg by mouth at bedtime.  Marland Kitchen ipratropium-albuterol (DUONEB) 0.5-2.5 (3) MG/3ML SOLN Take 3 mLs by nebulization every 6 (six) hours as needed.  . irbesartan (AVAPRO) 300 MG tablet Take 300 mg by mouth daily.  Marland Kitchen latanoprost (XALATAN) 0.005 % ophthalmic solution Place 1 drop into the right eye at bedtime.   . lidocaine (LIDODERM) 5 % Place 1 patch onto the skin daily. Remove & Discard patch within 12 hours or as directed by MD  . pantoprazole (PROTONIX) 40 MG tablet Take 40 mg by mouth daily.  . polyethylene glycol (MIRALAX / GLYCOLAX) packet Take 17 g by mouth daily as needed for mild constipation.  . Probiotic Product (ALIGN PO) Take 1 capsule by mouth daily.  . Wheat Dextrin (BENEFIBER DRINK MIX PO) Take 10 mLs by mouth daily.   No facility-administered encounter medications on file as of 09/04/2019.    Review of Systems  Constitutional: Positive for activity change and fatigue. Negative for appetite change, chills, diaphoresis, fever and unexpected weight change.  HENT: Negative for congestion.   Respiratory: Negative for cough, shortness of breath, wheezing and stridor.   Cardiovascular: Negative for chest pain, palpitations and leg swelling.  Gastrointestinal: Negative for abdominal distention, abdominal pain, constipation and diarrhea.  Genitourinary: Positive for hematuria. Negative for decreased urine volume, difficulty urinating, dysuria, flank pain, frequency, genital sores, penile pain, penile swelling and scrotal swelling.  Musculoskeletal: Positive for gait problem. Negative for arthralgias, back pain, joint swelling  and myalgias.  Neurological: Positive for weakness. Negative for dizziness, seizures, syncope, facial asymmetry, speech difficulty and headaches.  Hematological: Negative for adenopathy. Does not bruise/bleed easily.  Psychiatric/Behavioral: Positive for behavioral problems, confusion and dysphoric mood. Negative for agitation, decreased concentration, hallucinations, self-injury, sleep disturbance and suicidal ideas. The patient is nervous/anxious. The patient is not hyperactive.     Immunization History  Administered Date(s) Administered  . Influenza, High Dose Seasonal PF 01/04/2017  . Influenza-Unspecified 12/10/2017, 01/16/2019  . Moderna SARS-COVID-2 Vaccination 04/11/2019, 04/29/2019  . Pneumococcal Conjugate-13 01/30/2014  . Pneumococcal Polysaccharide-23 11/13/2006  . Tdap 11/15/2007, 01/22/2019  . Zoster 11/15/2007   Pertinent  Health Maintenance Due  Topic Date Due  . INFLUENZA VACCINE  10/19/2019  . PNA vac Low Risk Adult  Completed   Fall Risk  05/07/2019 03/18/2019 10/23/2018 05/30/2018  Falls in the  past year? 0 1 1 1   Number falls in past yr: 0 0 1 0  Injury with Fall? 0 1 0 -  Risk for fall due to : - Impaired balance/gait;History of fall(s);Impaired vision - -  Follow up - Falls evaluation completed;Falls prevention discussed - -   Functional Status Survey:    Vitals:   09/04/19 0942  BP: (!) 162/98  Pulse: 100  Resp: (!) 22  Temp: (!) 96.9 F (36.1 C)  SpO2: 96%   There is no height or weight on file to calculate BMI. Physical Exam Vitals and nursing note reviewed.  Constitutional:      General: He is not in acute distress.    Appearance: He is not diaphoretic.  HENT:     Head: Normocephalic and atraumatic.     Mouth/Throat:     Mouth: Mucous membranes are moist.     Pharynx: Oropharynx is clear. No oropharyngeal exudate.  Neck:     Thyroid: No thyromegaly.     Vascular: No JVD.     Trachea: No tracheal deviation.  Cardiovascular:     Rate and  Rhythm: Normal rate and regular rhythm.     Heart sounds: No murmur heard.      Comments: HR 100 Pulmonary:     Effort: Pulmonary effort is normal. No respiratory distress.     Breath sounds: Normal breath sounds. No wheezing.  Abdominal:     General: Bowel sounds are normal. There is no distension.     Palpations: Abdomen is soft. There is no mass.     Tenderness: There is no abdominal tenderness. There is no right CVA tenderness or guarding.  Genitourinary:    Penis: Normal.      Testes: Normal.  Musculoskeletal:     Cervical back: No rigidity.     Right lower leg: No edema.     Left lower leg: No edema.  Lymphadenopathy:     Cervical: No cervical adenopathy.  Skin:    General: Skin is warm and dry.  Neurological:     General: No focal deficit present.     Mental Status: He is alert. Mental status is at baseline.     Comments: Alert, oriented to self and place.  Psychiatric:        Mood and Affect: Mood normal.     Labs reviewed: Recent Labs    06/11/19 0000 06/11/19 0900 07/14/19 1200  NA 140  --  138  K 4.8  --  4.1  CL 101  --  102  CO2 25*  --  21  BUN 11  --  15  CREATININE 0.9  --  0.9  CALCIUM  --  10.2 10.0   Recent Labs    07/14/19 1200  AST 14  ALT 10  ALBUMIN 4.2   Recent Labs    07/14/19 1200  WBC 8.4  HGB 14.4  HCT 43  PLT 217   Lab Results  Component Value Date   TSH 3.16 07/14/2019   Lab Results  Component Value Date   HGBA1C 5.5 04/06/2018   Lab Results  Component Value Date   CHOL 160 04/07/2018   HDL 47 04/07/2018   LDLCALC 89 04/07/2018   TRIG 122 04/07/2018   CHOLHDL 3.4 04/07/2018    Significant Diagnostic Results in last 30 days:  No results found.  Assessment/Plan 1. Weakness Needing more assistance and worsening gait. Will check labs.  2. Gross hematuria Mild and noted in brief. Monitor  vitals qshift and check UA C and S Does not appear hypovolemic or toxic at this time.   3. Anxiety state Some of his  anxiety and related behaviors may be related to an acute issue. If no other cause is identified will consider increasing his lexapro (generally would not go above 10),could also consider Buspar    Family/ staff Communication:discussed with his nurse Misti and the resident  Labs/tests ordered:  CBC BMP UA C and S

## 2019-09-05 ENCOUNTER — Other Ambulatory Visit: Payer: Self-pay | Admitting: Adult Health

## 2019-09-05 ENCOUNTER — Non-Acute Institutional Stay (SKILLED_NURSING_FACILITY): Payer: Medicare Other | Admitting: Adult Health

## 2019-09-05 ENCOUNTER — Encounter: Payer: Self-pay | Admitting: Adult Health

## 2019-09-05 DIAGNOSIS — R310 Gross hematuria: Secondary | ICD-10-CM

## 2019-09-05 DIAGNOSIS — I10 Essential (primary) hypertension: Secondary | ICD-10-CM | POA: Diagnosis not present

## 2019-09-05 DIAGNOSIS — N3001 Acute cystitis with hematuria: Secondary | ICD-10-CM | POA: Diagnosis not present

## 2019-09-05 LAB — CBC AND DIFFERENTIAL
HCT: 45 (ref 41–53)
Hemoglobin: 14.9 (ref 13.5–17.5)
Platelets: 222 (ref 150–399)
WBC: 7.7

## 2019-09-05 LAB — COMPREHENSIVE METABOLIC PANEL: Calcium: 10.3 (ref 8.7–10.7)

## 2019-09-05 LAB — BASIC METABOLIC PANEL
BUN: 14 (ref 4–21)
CO2: 21 (ref 13–22)
Chloride: 102 (ref 99–108)
Creatinine: 0.8 (ref 0.6–1.3)
Glucose: 140
Potassium: 4.3 (ref 3.4–5.3)
Sodium: 136 — AB (ref 137–147)

## 2019-09-05 LAB — CBC: RBC: 5.26 — AB (ref 3.87–5.11)

## 2019-09-05 MED ORDER — CEPHALEXIN 500 MG PO CAPS: 500.0000 mg | ORAL_CAPSULE | Freq: Four times a day (QID) | ORAL | 0 refills | Status: DC

## 2019-09-05 NOTE — Progress Notes (Signed)
Location:  [facility] of Service:  SNF (31) Provider:   Cindi Carbon, ANP [name] [phone]   Gayland Curry, DO  Patient Care Team: Gayland Curry, DO as PCP - General (Geriatric Medicine)  Extended Emergency Contact Information Primary Emergency Contact: [name],[name] Address: [address]          [place] [address] [name] Duffy of [place] Phone: [phone] Relation: Spouse Secondary Emergency Contact: [name] Mobile Phone: [phone] Relation: Nephew  Code Status:  Full code  Goals of care: Advanced Directive information Advanced Directives 08/19/2019  Does Patient Have a Medical Advance Directive? Yes  Type of Advance Directive [place]  Does patient want to make changes to medical advance directive? No - Patient declined  Copy of [place] in Chart? Yes - validated most recent copy scanned in chart (See row information)  Would patient like information on creating a medical advance directive? -     Chief Complaint  Patient presents with  . Acute Visit    htn    HPI:  Pt is a 86 y.o. male seen today for an acute visit for htn and hematuria.   [name] Duffy was seen yesterday for hematuria and weakness. He was started on Keflex for possible UTI. He appears to be in better spirits and has more injury.  BMP and CBC were unremarkable. UA showed RBC TNTC, WBC 21-50, 1+ bacteria  His bp has been consistently elevated prior to med administration, 160/102 this morning. He is not having any headaches, dizziness, 1 sided weakness, cp, sob, etc.    Past Medical History:  Diagnosis Date  . Abnormal gait   . Adenocarcinoma of prostate ([place])   . Anxiety   . Arthritis    hands  . Balance problem   . Benzodiazepine dependence ([place])   . Blood transfusion without reported diagnosis    pt thinks had blood trnsfusion at prostate ca surgery   . Blurred vision   .  Carotid atherosclerosis   . Cataract   . Depression, major   . Glaucoma   . Hearing loss   . High cholesterol   . Hypertension   . Knee pain, bilateral   . Lacunar infarction ([place])   . Lumbar radiculopathy   . Ophthalmic herpes zoster   . Prostate cancer ([place])   . Sacroiliitis ([place])   . Vertigo    Past Surgical History:  Procedure Laterality Date  . COLONOSCOPY    . HERNIA REPAIR    . PROSTATECTOMY      Allergies  Allergen Reactions  . Rofecoxib Swelling    REACTION: swelling- Vioxx name brand     Outpatient Encounter Medications as of 09/05/2019  Medication Sig  . acetaminophen (TYLENOL) 325 MG tablet Take 650 mg by mouth every 4 (four) hours as needed for mild pain.  Marland Kitchen ALPRAZolam (XANAX) 0.25 MG tablet Take 0.25 mg by mouth at bedtime. Special instructions: Schedule before dinner Q evening  . ALPRAZolam (XANAX) 0.25 MG tablet Take 0.25 mg by mouth 2 (two) times daily as needed for anxiety. X 14 days  . atorvastatin (LIPITOR) 20 MG tablet Take 1 tablet (20 mg total) by mouth daily at 6 PM.  . calcium carbonate (TUMS - DOSED IN MG ELEMENTAL CALCIUM) 500 MG chewable tablet Chew 2 tablets by mouth as needed for indigestion.  . cephALEXin (KEFLEX) 500 MG capsule Take 1 capsule (500 mg total) by mouth 4 (four)  times daily for 7 days.  . cholestyramine (QUESTRAN) 4 g packet Take 4 g by mouth 2 (two) times daily.   . clopidogrel (PLAVIX) 75 MG tablet Take 1 tablet (75 mg total) by mouth daily.  Marland Kitchen escitalopram (LEXAPRO) 10 MG tablet Take 5 mg by mouth daily. X 1 week then increase to 10 mg qd  . famotidine (PEPCID) 20 MG tablet Take 20 mg by mouth at bedtime.  Marland Kitchen ipratropium-albuterol (DUONEB) 0.5-2.5 (3) MG/3ML SOLN Take 3 mLs by nebulization every 6 (six) hours as needed.  . irbesartan (AVAPRO) 300 MG tablet Take 300 mg by mouth daily.  Marland Kitchen latanoprost (XALATAN) 0.005 % ophthalmic solution Place 1 drop into the right eye at bedtime.   . lidocaine (LIDODERM) 5 % Place 1 patch onto  the skin daily. Remove & Discard patch within 12 hours or as directed by MD  . pantoprazole (PROTONIX) 40 MG tablet Take 40 mg by mouth daily.  . polyethylene glycol (MIRALAX / GLYCOLAX) packet Take 17 g by mouth daily as needed for mild constipation.  . Probiotic Product (ALIGN PO) Take 1 capsule by mouth daily.  . Wheat Dextrin (BENEFIBER DRINK MIX PO) Take 10 mLs by mouth daily.   No facility-administered encounter medications on file as of 09/05/2019.    Review of Systems  Constitutional: Positive for activity change (improved). Negative for appetite change, chills, diaphoresis, fatigue, fever and unexpected weight change.  Respiratory: Negative for cough, shortness of breath (on exertion which is chronic), wheezing and stridor.   Cardiovascular: Negative for chest pain, palpitations and leg swelling.  Gastrointestinal: Negative for abdominal distention, abdominal pain, constipation and diarrhea.  Genitourinary: Positive for hematuria. Negative for decreased urine volume, difficulty urinating, dysuria, flank pain, frequency, penile pain, penile swelling and urgency.  Musculoskeletal: Positive for gait problem. Negative for arthralgias, back pain, joint swelling and myalgias.  Skin: Negative for wound.  Neurological: Negative for dizziness, seizures, syncope, facial asymmetry, speech difficulty, weakness (chronic due to CVA) and headaches.  Hematological: Negative for adenopathy. Does not bruise/bleed easily.  Psychiatric/Behavioral: Positive for confusion and dysphoric mood. Negative for agitation and behavioral problems.    Immunization History  Administered Date(s) Administered  . Influenza, High Dose Seasonal PF 01/04/2017  . Influenza-Unspecified 12/10/2017, 01/16/2019  . Moderna SARS-COVID-2 Vaccination 04/11/2019, 04/29/2019  . Pneumococcal Conjugate-13 01/30/2014  . Pneumococcal Polysaccharide-23 11/13/2006  . Tdap 11/15/2007, 01/22/2019  . Zoster 11/15/2007   Pertinent   Health Maintenance Due  Topic Date Due  . INFLUENZA VACCINE  10/19/2019  . PNA vac Low Risk Adult  Completed   Fall Risk  05/07/2019 03/18/2019 10/23/2018 05/30/2018  Falls in the past year? 0 1 1 1   Number falls in past yr: 0 0 1 0  Injury with Fall? 0 1 0 -  Risk for fall due to : - Impaired balance/gait;History of fall(s);Impaired vision - -  Follow up - Falls evaluation completed;Falls prevention discussed - -   Functional Status Survey:    Vitals:   09/05/19 1140  BP: (!) 160/102  Pulse: 88  Resp: 18  Temp: 98.4 F (36.9 C)  SpO2: 96%   There is no height or weight on file to calculate BMI. Physical Exam Vitals and nursing note reviewed.  Constitutional:      General: He is not in acute distress.    Appearance: He is not diaphoretic.  HENT:     Head: Normocephalic and atraumatic.  Neck:     Thyroid: No thyromegaly.  Vascular: No JVD.     Trachea: No tracheal deviation.  Cardiovascular:     Rate and Rhythm: Normal rate and regular rhythm.     Heart sounds: No murmur heard.   Pulmonary:     Effort: Pulmonary effort is normal. No respiratory distress.     Breath sounds: Normal breath sounds. No wheezing.  Abdominal:     General: Bowel sounds are normal. There is no distension.     Palpations: Abdomen is soft.     Tenderness: There is no abdominal tenderness. There is no right CVA tenderness, left CVA tenderness or guarding.  Musculoskeletal:     Right lower leg: No edema.     Left lower leg: No edema.  Lymphadenopathy:     Cervical: No cervical adenopathy.  Skin:    General: Skin is warm and dry.  Neurological:     Mental Status: He is alert.     Comments: Oriented x 2, alert and pleasant  Psychiatric:        Mood and Affect: Mood normal.     Labs reviewed: Recent Labs    06/11/19 0000 06/11/19 0900 07/14/19 1200  NA 140  --  138  K 4.8  --  4.1  CL 101  --  102  CO2 25*  --  21  BUN 11  --  15  CREATININE 0.9  --  0.9  CALCIUM  --  10.2 10.0    Recent Labs    07/14/19 1200  AST 14  ALT 10  ALBUMIN 4.2   Recent Labs    07/14/19 1200  WBC 8.4  HGB 14.4  HCT 43  PLT 217   Lab Results  Component Value Date   TSH 3.16 07/14/2019   Lab Results  Component Value Date   HGBA1C 5.5 04/06/2018   Lab Results  Component Value Date   CHOL 160 04/07/2018   HDL 47 04/07/2018   LDLCALC 89 04/07/2018   TRIG 122 04/07/2018   CHOLHDL 3.4 04/07/2018    Significant Diagnostic Results in last 30 days:  No results found.  Assessment/Plan 1. Acute cystitis with hematuria Hematuria noted in brief, mild at this time.  Begin Keflex 500 mg QID x 7 days, await C and S  Pt has a hx of prostate ca, if symptoms persist would recommend urology referral   2. Essential hypertension Chlorthalidone 12.5 mg qd due to consistently elevated BP  BMP in 1 week  Continue to monitor vitals qshft    Family/ staff Communication: discussed with the DON [name] Duffy, WS to communicate to his family recommendations. The nsg supervisor can give an update to his wife in [place] and the nurse on the hall can notify his POA.   Labs/tests ordered:  BMP 1 week

## 2019-09-05 NOTE — Progress Notes (Signed)
Pt presents with weakness and hematuria. BMP and CBC were unremarkable. UA showed RBC TNTC, WBC 21-50, 1+ bacteria, C and S pending. Will start Keflex 500 mg QID x 7 days await C and S.

## 2019-09-11 LAB — BASIC METABOLIC PANEL
BUN: 15 (ref 4–21)
CO2: 25 — AB (ref 13–22)
Chloride: 98 — AB (ref 99–108)
Creatinine: 0.8 (ref 0.6–1.3)
Glucose: 127
Potassium: 4.3 (ref 3.4–5.3)
Sodium: 135 — AB (ref 137–147)

## 2019-09-11 LAB — COMPREHENSIVE METABOLIC PANEL: Calcium: 10.7 (ref 8.7–10.7)

## 2019-09-12 ENCOUNTER — Non-Acute Institutional Stay (SKILLED_NURSING_FACILITY): Payer: Medicare Other | Admitting: Adult Health

## 2019-09-12 ENCOUNTER — Encounter: Payer: Self-pay | Admitting: Adult Health

## 2019-09-12 DIAGNOSIS — F411 Generalized anxiety disorder: Secondary | ICD-10-CM | POA: Diagnosis not present

## 2019-09-12 DIAGNOSIS — N3001 Acute cystitis with hematuria: Secondary | ICD-10-CM

## 2019-09-12 DIAGNOSIS — I10 Essential (primary) hypertension: Secondary | ICD-10-CM | POA: Diagnosis not present

## 2019-09-12 NOTE — Progress Notes (Signed)
Location:  [facility] of Service:  SNF (31) Provider:   Cindi Carbon, ANP [place] [phone]   Gayland Curry, DO  Patient Care Team: Gayland Curry, DO as PCP - General (Geriatric Medicine)  Extended Emergency Contact Information Primary Emergency Contact: Delapena,[name] Address: [place]           [address] [name] of [place] Phone: [phone] Relation: Spouse Secondary Emergency Contact: [name] Mobile Phone: [phone] Relation: Nephew  Code Status:  Full code  Goals of care: Advanced Directive information Advanced Directives 08/19/2019  Does Patient Have a Medical Advance Directive? Yes  Type of Advance Directive [place]  Does patient want to make changes to medical advance directive? No - Patient declined  Copy of [place] in Chart? Yes - validated most recent copy scanned in chart (See row information)  Would patient like information on creating a medical advance directive? -     Chief Complaint  Patient presents with  . Acute Visit    f/u htn and hematuria, along with depression    HPI:  Pt is a 86 y.o. male seen today for an acute visit for f/u regarding htn, hematuria, and depression  He was treated for 7 days with Keflex for a UTI on 6/18.  His nurse reports he is feeling better. He had hematuria at the time but this has resolved. He denies any urinary symptoms at present During the treatment for UTI he experienced increased weakness, anxiety, and worsening confusion. This has also improved. He has chronic anxiety but it seems to be back at baseline.   In addition he was treated with chlorthalidone for htn. His bp did reduce but he continues to have some elevation and his HR at times is 108.  For my visit 09/12/2019 BP 153/98 and HR 84.  F/U BMP 09/11/19 Ca  10.7  BUN/CR 15/0.78 NA 135   Past Medical History:  Diagnosis Date  .  Abnormal gait   . Adenocarcinoma of prostate ([place])   . Anxiety   . Arthritis    hands  . Balance problem   . Benzodiazepine dependence ([place])   . Blood transfusion without reported diagnosis    pt thinks had blood trnsfusion at prostate ca surgery   . Blurred vision   . Carotid atherosclerosis   . Cataract   . Depression, major   . Glaucoma   . Hearing loss   . High cholesterol   . Hypertension   . Knee pain, bilateral   . Lacunar infarction ([place])   . Lumbar radiculopathy   . Ophthalmic herpes zoster   . Prostate cancer ([place])   . Sacroiliitis ([place])   . Vertigo    Past Surgical History:  Procedure Laterality Date  . COLONOSCOPY    . HERNIA REPAIR    . PROSTATECTOMY      Allergies  Allergen Reactions  . Rofecoxib Swelling    REACTION: swelling- Vioxx name brand     Outpatient Encounter Medications as of 09/12/2019  Medication Sig  . acetaminophen (TYLENOL) 325 MG tablet Take 650 mg by mouth every 4 (four) hours as needed for mild pain.  Marland Kitchen ALPRAZolam (XANAX) 0.25 MG tablet Take 0.25 mg by mouth at bedtime. Special instructions: Schedule before dinner Q evening  . ALPRAZolam (XANAX) 0.25 MG tablet Take 0.25 mg by mouth 2 (two) times daily as needed for anxiety. X 14 days  . atorvastatin (LIPITOR)  20 MG tablet Take 1 tablet (20 mg total) by mouth daily at 6 PM.  . calcium carbonate (TUMS - DOSED IN MG ELEMENTAL CALCIUM) 500 MG chewable tablet Chew 2 tablets by mouth as needed for indigestion.  . cephALEXin (KEFLEX) 500 MG capsule Take 1 capsule (500 mg total) by mouth 4 (four) times daily for 7 days.  . cholestyramine (QUESTRAN) 4 g packet Take 4 g by mouth 2 (two) times daily.   . clopidogrel (PLAVIX) 75 MG tablet Take 1 tablet (75 mg total) by mouth daily.  Marland Kitchen escitalopram (LEXAPRO) 10 MG tablet Take 5 mg by mouth daily. X 1 week then increase to 10 mg qd  . famotidine (PEPCID) 20 MG tablet Take 20 mg by mouth at bedtime.  Marland Kitchen ipratropium-albuterol (DUONEB) 0.5-2.5 (3)  MG/3ML SOLN Take 3 mLs by nebulization every 6 (six) hours as needed.  . irbesartan (AVAPRO) 300 MG tablet Take 300 mg by mouth daily.  Marland Kitchen latanoprost (XALATAN) 0.005 % ophthalmic solution Place 1 drop into the right eye at bedtime.   . lidocaine (LIDODERM) 5 % Place 1 patch onto the skin daily. Remove & Discard patch within 12 hours or as directed by MD  . pantoprazole (PROTONIX) 40 MG tablet Take 40 mg by mouth daily.  . polyethylene glycol (MIRALAX / GLYCOLAX) packet Take 17 g by mouth daily as needed for mild constipation.  . Probiotic Product (ALIGN PO) Take 1 capsule by mouth daily.  . Wheat Dextrin (BENEFIBER DRINK MIX PO) Take 10 mLs by mouth daily.   No facility-administered encounter medications on file as of 09/12/2019.    Review of Systems  Constitutional: Negative for activity change, appetite change, chills, diaphoresis, fatigue, fever and unexpected weight change.  Respiratory: Negative for cough, shortness of breath, wheezing and stridor.   Cardiovascular: Negative for chest pain, palpitations and leg swelling.  Gastrointestinal: Negative for abdominal distention, abdominal pain, constipation and diarrhea.  Genitourinary: Negative for difficulty urinating and dysuria.  Musculoskeletal: Positive for gait problem. Negative for arthralgias, back pain, joint swelling and myalgias.  Skin: Negative for wound.  Neurological: Negative for dizziness, seizures, syncope, facial asymmetry, speech difficulty, weakness and headaches.  Hematological: Negative for adenopathy. Does not bruise/bleed easily.  Psychiatric/Behavioral: Positive for confusion. Negative for agitation, behavioral problems, sleep disturbance and suicidal ideas. The patient is nervous/anxious.     Immunization History  Administered Date(s) Administered  . Influenza, High Dose Seasonal PF 01/04/2017  . Influenza-Unspecified 12/10/2017, 01/16/2019  . Moderna SARS-COVID-2 Vaccination 04/11/2019, 04/29/2019  .  Pneumococcal Conjugate-13 01/30/2014  . Pneumococcal Polysaccharide-23 11/13/2006  . Tdap 11/15/2007, 01/22/2019  . Zoster 11/15/2007   Pertinent  Health Maintenance Due  Topic Date Due  . INFLUENZA VACCINE  10/19/2019  . PNA vac Low Risk Adult  Completed   Fall Risk  05/07/2019 03/18/2019 10/23/2018 05/30/2018  Falls in the past year? 0 1 1 1   Number falls in past yr: 0 0 1 0  Injury with Fall? 0 1 0 -  Risk for fall due to : - Impaired balance/gait;History of fall(s);Impaired vision - -  Follow up - Falls evaluation completed;Falls prevention discussed - -   Functional Status Survey:    Vitals:   09/12/19 0917  BP: (!) 153/98  Pulse: 84  Resp: 20  Temp: (!) 97.3 F (36.3 C)   There is no height or weight on file to calculate BMI. Physical Exam Constitutional:      General: He is not in acute distress.  Appearance: He is not diaphoretic.  HENT:     Head: Normocephalic and atraumatic.  Neck:     Thyroid: No thyromegaly.     Vascular: No JVD.     Trachea: No tracheal deviation.  Cardiovascular:     Rate and Rhythm: Normal rate and regular rhythm.     Heart sounds: No murmur heard.   Pulmonary:     Effort: Pulmonary effort is normal. No respiratory distress.     Breath sounds: No wheezing.     Comments: Decreased bases Abdominal:     General: Bowel sounds are normal. There is no distension.     Palpations: Abdomen is soft.     Tenderness: There is no abdominal tenderness. There is no right CVA tenderness or left CVA tenderness.  Musculoskeletal:     Right lower leg: No edema.     Left lower leg: No edema.     Comments: kyphotic  Lymphadenopathy:     Cervical: No cervical adenopathy.  Skin:    General: Skin is warm and dry.  Neurological:     Mental Status: He is alert and oriented to person, place, and time.     Cranial Nerves: No cranial nerve deficit.  Psychiatric:        Mood and Affect: Mood normal.     Labs reviewed: Recent Labs    06/11/19 0000  06/11/19 0900 07/14/19 1200  NA 140  --  138  K 4.8  --  4.1  CL 101  --  102  CO2 25*  --  21  BUN 11  --  15  CREATININE 0.9  --  0.9  CALCIUM  --  10.2 10.0   Recent Labs    07/14/19 1200  AST 14  ALT 10  ALBUMIN 4.2   Recent Labs    07/14/19 1200  WBC 8.4  HGB 14.4  HCT 43  PLT 217   Lab Results  Component Value Date   TSH 3.16 07/14/2019   Lab Results  Component Value Date   HGBA1C 5.5 04/06/2018   Lab Results  Component Value Date   CHOL 160 04/07/2018   HDL 47 04/07/2018   LDLCALC 89 04/07/2018   TRIG 122 04/07/2018   CHOLHDL 3.4 04/07/2018    Significant Diagnostic Results in last 30 days:  No results found.  Assessment/Plan  1. Essential hypertension Due to rising Ca with chlorthalidone (along with slightly elevated HR) will d/c and start metoprolol 12.5 mg bid. Continue to monitor bp and report if >150/90  2. Acute cystitis with hematuria Resolved with no further hematuria. Pt is feeling better and ready to resume walking program  3. Generalized anxiety disorder During his UTI he was experiencing more feelings of anxiety/depression but at this time he has improved. Continue Lexapro at 10 mg qd     Family/ staff Communication: discussed with his nurse Misti  Labs/tests ordered:  NA

## 2019-09-17 NOTE — Telephone Encounter (Signed)
This encounter was created in error - please disregard.

## 2019-09-26 ENCOUNTER — Encounter: Payer: Self-pay | Admitting: Internal Medicine

## 2019-10-09 ENCOUNTER — Encounter: Payer: Self-pay | Admitting: Adult Health

## 2019-10-09 ENCOUNTER — Non-Acute Institutional Stay (SKILLED_NURSING_FACILITY): Payer: Medicare Other | Admitting: Adult Health

## 2019-10-09 DIAGNOSIS — R0600 Dyspnea, unspecified: Secondary | ICD-10-CM

## 2019-10-09 DIAGNOSIS — F0150 Vascular dementia without behavioral disturbance: Secondary | ICD-10-CM

## 2019-10-09 DIAGNOSIS — F411 Generalized anxiety disorder: Secondary | ICD-10-CM

## 2019-10-09 DIAGNOSIS — I10 Essential (primary) hypertension: Secondary | ICD-10-CM

## 2019-10-09 DIAGNOSIS — R0609 Other forms of dyspnea: Secondary | ICD-10-CM

## 2019-10-09 DIAGNOSIS — K449 Diaphragmatic hernia without obstruction or gangrene: Secondary | ICD-10-CM

## 2019-10-09 NOTE — Progress Notes (Signed)
Location:  [facility] of Service:  SNF (31) Provider:   Cindi Carbon, ANP [place] [phone]   Gayland Curry, DO  Patient Care Team: Gayland Curry, DO as PCP - General (Geriatric Medicine)  Extended Emergency Contact Information Primary Emergency Contact: Edgley,[name] Address: [address] [place]          [place] [address] [name] [place] Phone: [phone] Relation: Spouse Secondary Emergency Contact: [name] Mobile Phone: [phone] Relation: Nephew  Code Status:  Full code  Goals of care: Advanced Directive information Advanced Directives 08/19/2019  Does Patient Have a Medical Advance Directive? Yes  Type of Advance Directive [place]  Does patient want to make changes to medical advance directive? No - Patient declined  Copy of [place] in Chart? Yes - validated most recent copy scanned in chart (See row information)  Would patient like information on creating a medical advance directive? -     Chief Complaint  Patient presents with  . Medical Management of Chronic Issues    HPI:  Pt is a 86 y.o. male seen today for medical management of chronic diseases   HTN: BP and HR are better controlled with low dose metoprolol. No s/e noted  Anxiety: He reports occasional periods of anxiety in the afternoon. Sleeping well. Appetite is fair.   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 He has chronic DOE associated with this issue. The nurses report that if this happens they use duonebs and deep breathing along with rest and it usually resolves.   Vascular dementia with associated hx of CVA: progressive over the past year with worsening hearing loss. Followed by audiology. Continues on plavix and lipitor  Hypercalcemia: Ca 10.7 on 09/11/19   Past Medical History:  Diagnosis Date  . Abnormal gait   . Adenocarcinoma of prostate ([place])   .  Anxiety   . Arthritis    hands  . Balance problem   . Benzodiazepine dependence ([place])   . Blood transfusion without reported diagnosis    pt thinks had blood trnsfusion at prostate ca surgery   . Blurred vision   . Carotid atherosclerosis   . Cataract   . Depression, major   . Glaucoma   . Hearing loss   . High cholesterol   . Hypertension   . Knee pain, bilateral   . Lacunar infarction ([place])   . Lumbar radiculopathy   . Ophthalmic herpes zoster   . Prostate cancer ([place])   . Sacroiliitis ([place])   . Vertigo    Past Surgical History:  Procedure Laterality Date  . COLONOSCOPY    . HERNIA REPAIR    . PROSTATECTOMY      Allergies  Allergen Reactions  . Rofecoxib Swelling    REACTION: swelling- Vioxx name brand     Outpatient Encounter Medications as of 10/09/2019  Medication Sig  . atorvastatin (LIPITOR) 20 MG tablet Take 1 tablet (20 mg total) by mouth daily at 6 PM.  . calcium carbonate (TUMS - DOSED IN MG ELEMENTAL CALCIUM) 500 MG chewable tablet Chew 2 tablets by mouth as needed for indigestion.  . cholestyramine (QUESTRAN) 4 g packet Take 4 g by mouth 2 (two) times daily.   . clopidogrel (PLAVIX) 75 MG tablet Take 1 tablet (75 mg total) by mouth daily.  Marland Kitchen escitalopram (LEXAPRO) 10 MG tablet Take 5 mg by mouth daily. X 1 week then increase to 10 mg  qd  . famotidine (PEPCID) 20 MG tablet Take 20 mg by mouth at bedtime.  Marland Kitchen ipratropium-albuterol (DUONEB) 0.5-2.5 (3) MG/3ML SOLN Take 3 mLs by nebulization every 6 (six) hours as needed.  . irbesartan (AVAPRO) 300 MG tablet Take 300 mg by mouth daily.  . metoprolol tartrate (LOPRESSOR) 25 MG tablet Take 12.5 mg by mouth 2 (two) times daily.  . pantoprazole (PROTONIX) 40 MG tablet Take 40 mg by mouth daily.  . polyethylene glycol (MIRALAX / GLYCOLAX) packet Take 17 g by mouth daily as needed for mild constipation.  . Probiotic Product (ALIGN PO) Take 1 capsule by mouth daily.  . Wheat Dextrin (BENEFIBER DRINK MIX PO) Take 10  mLs by mouth daily.  . [DISCONTINUED] ALPRAZolam (XANAX) 0.25 MG tablet Take 0.25 mg by mouth 2 (two) times daily as needed for anxiety. X 14 days  . acetaminophen (TYLENOL) 325 MG tablet Take 650 mg by mouth every 4 (four) hours as needed for mild pain.  Marland Kitchen ALPRAZolam (XANAX) 0.25 MG tablet Take 0.25 mg by mouth at bedtime. Special instructions: Schedule before dinner Q evening  . latanoprost (XALATAN) 0.005 % ophthalmic solution Place 1 drop into the right eye at bedtime.   . lidocaine (LIDODERM) 5 % Place 1 patch onto the skin daily. Remove & Discard patch within 12 hours or as directed by MD (Patient taking differently: Place 1 patch onto the skin daily as needed. Remove & Discard patch within 12 hours or as directed by MD)   No facility-administered encounter medications on file as of 10/09/2019.   Review of Systems  Constitutional: Negative for chills, diaphoresis, fever, malaise/fatigue and weight loss.  HENT: Positive for hearing loss.   Respiratory: Positive for shortness of breath (on exertion ). Negative for cough, hemoptysis, sputum production and wheezing.   Cardiovascular: Negative for chest pain, leg swelling and PND.  Gastrointestinal: Negative for abdominal pain, constipation, diarrhea, nausea and vomiting.  Genitourinary: Negative for dysuria and urgency.  Musculoskeletal: Positive for falls. Negative for back pain, joint pain, myalgias and neck pain.  Skin: Negative for itching and rash.  Neurological: Negative for dizziness, weakness and headaches.  Psychiatric/Behavioral: Positive for memory loss. Negative for depression, hallucinations, substance abuse and suicidal ideas. The patient is nervous/anxious. The patient does not have insomnia.      Immunization History  Administered Date(s) Administered  . Influenza, High Dose Seasonal PF 01/04/2017  . Influenza-Unspecified 12/10/2017, 01/16/2019  . Moderna SARS-COVID-2 Vaccination 04/11/2019, 04/29/2019  . Pneumococcal  Conjugate-13 01/30/2014  . Pneumococcal Polysaccharide-23 11/13/2006  . Tdap 11/15/2007, 01/22/2019  . Zoster 11/15/2007   Pertinent  Health Maintenance Due  Topic Date Due  . INFLUENZA VACCINE  10/19/2019  . PNA vac Low Risk Adult  Completed   Fall Risk  05/07/2019 03/18/2019 10/23/2018 05/30/2018  Falls in the past year? 0 1 1 1   Number falls in past yr: 0 0 1 0  Injury with Fall? 0 1 0 -  Risk for fall due to : - Impaired balance/gait;History of fall(s);Impaired vision - -  Follow up - Falls evaluation completed;Falls prevention discussed - -   Functional Status Survey:    Vitals:   10/09/19 1544  Weight: 183 lb 6.4 oz (83.2 kg)   Body mass index is 24.2 kg/m. Physical Exam Constitutional:      General: He is not in acute distress.    Appearance: He is not diaphoretic.  HENT:     Head: Normocephalic and atraumatic.  Neck:  Thyroid: No thyromegaly.     Vascular: No JVD.     Trachea: No tracheal deviation.  Cardiovascular:     Rate and Rhythm: Normal rate and regular rhythm.     Heart sounds: No murmur heard.   Pulmonary:     Effort: Pulmonary effort is normal. No respiratory distress.     Breath sounds: No wheezing.     Comments: Decreased bases Abdominal:     General: Bowel sounds are normal. There is no distension.     Palpations: Abdomen is soft.     Tenderness: There is no abdominal tenderness. There is no right CVA tenderness or left CVA tenderness.  Musculoskeletal:     Right lower leg: No edema.     Left lower leg: No edema.     Comments: kyphotic  Lymphadenopathy:     Cervical: No cervical adenopathy.  Skin:    General: Skin is warm and dry.  Neurological:     Mental Status: He is alert and oriented to person, place, and time.     Cranial Nerves: No cranial nerve deficit.  Psychiatric:        Mood and Affect: Mood normal.     Labs reviewed: Recent Labs    06/11/19 0000 07/14/19 0000 07/14/19 1200 09/05/19 1535 09/11/19 1535  NA   < >   --  138 136* 135*  K   < >  --  4.1 4.3 4.3  CL   < >  --  102 102 98*  CO2   < >  --  21 21 25*  BUN   < >  --  15 14 15   CREATININE   < >  --  0.9 0.8 0.8  CALCIUM  --  10.0  --  10.3 10.7   < > = values in this interval not displayed.   Recent Labs    07/14/19 0000 07/14/19 1200  AST  --  14  ALT  --  10  ALBUMIN 4.2  --    Recent Labs    07/14/19 1200 09/05/19 1535  WBC 8.4 7.7  HGB 14.4 14.9  HCT 43 45  PLT 217 222   Lab Results  Component Value Date   TSH 3.16 07/14/2019   Lab Results  Component Value Date   HGBA1C 5.5 04/06/2018   Lab Results  Component Value Date   CHOL 160 04/07/2018   HDL 47 04/07/2018   LDLCALC 89 04/07/2018   TRIG 122 04/07/2018   CHOLHDL 3.4 04/07/2018    Significant Diagnostic Results in last 30 days:  No results found.  Assessment/Plan  1. Vascular dementia without behavioral disturbance (HCC) Moderate  MMSE 19/30 12/20 Continue supportive care in the skilled environment  2. Paraesophageal hernia Controlled reflux with Protonix and Pepcid. No additional measures indicated due to his age and debility   3. Dyspnea on exertion Likely due to #2 along with deconditioning and kyphotic posture. Continue as needed duonebs.  4. Anxiety state Improved Continue Lexapro 10 mg qd and xanax each afternoon   5. Essential hypertension Controlled  Continue Lopressor 12.5 mg bid and avapro 300 mg qd  6. Hypercalcemia Likely due to diuretic use  BMP mon Monday 7/26 due to elevated Ca  Pt requested less medication. We reviewed his med list and the only med that would be appropriate to trial off is the lidoderm patch for neck pain. He is not having any pain and agreed to have this patch changed to as  needed   Family/ staff Communication: discussed with his nurse Misti  Labs/tests ordered:  BMP

## 2019-10-13 DIAGNOSIS — Z79899 Other long term (current) drug therapy: Secondary | ICD-10-CM | POA: Diagnosis not present

## 2019-10-13 LAB — BASIC METABOLIC PANEL
BUN: 12 (ref 4–21)
CO2: 24 — AB (ref 13–22)
Chloride: 103 (ref 99–108)
Creatinine: 0.8 (ref 0.6–1.3)
Glucose: 93
Potassium: 4 (ref 3.4–5.3)
Sodium: 142 (ref 137–147)

## 2019-10-13 LAB — COMPREHENSIVE METABOLIC PANEL: Calcium: 10.2 (ref 8.7–10.7)

## 2019-10-20 ENCOUNTER — Encounter: Payer: Self-pay | Admitting: Adult Health

## 2019-10-20 ENCOUNTER — Non-Acute Institutional Stay (SKILLED_NURSING_FACILITY): Payer: Medicare Other | Admitting: Adult Health

## 2019-10-20 DIAGNOSIS — R410 Disorientation, unspecified: Secondary | ICD-10-CM

## 2019-10-20 DIAGNOSIS — R531 Weakness: Secondary | ICD-10-CM | POA: Diagnosis not present

## 2019-10-20 DIAGNOSIS — F411 Generalized anxiety disorder: Secondary | ICD-10-CM | POA: Diagnosis not present

## 2019-10-20 NOTE — Progress Notes (Signed)
Location:  [facility] of Service:  SNF (31) Provider:   Cindi Carbon, ANP [place] [phone]   [name] Curry, DO  [name] Duffy: [name] Curry, DO as PCP - General (Geriatric Medicine)  Extended Emergency Contact Information Primary Emergency Contact: Philbrook,[name] Duffy Address: [place]          [place] [address] [name] Duffy of [place] Phone: [phone] Relation: Spouse Secondary Emergency Contact: [name] Mobile Phone: [phone] Relation: Nephew  Code Status:  Full code  Goals of care: Advanced Directive information Advanced Directives 08/19/2019  Does [name] Have a Medical Advance Directive? Yes  Type of Advance Directive [place]  Does [name] want to make changes to medical advance directive? No - [name] declined  Copy of [place] in Chart? Yes - validated most recent copy scanned in chart (See row information)  Would [name] like information on creating a medical advance directive? -     Chief Complaint  [name] presents with  . Acute Visit    confusion, weakness    HPI:  Pt is a 86 y.o. male seen today for an acute visit for weakness and confusion. [name] Duffy has a hx of vascular dementia, anxiety disorder, prostate ca, glaucoma, CVA, lumbar radiculopathy, fecal incontinence, paraesophageal hernia, and DOE.  The nurse reports that over the past few days he has changed. He is no longer able to walk or transfer. He is quite moody and angers easily. He appears paranoid toward the staff. He is getting disoriented in regards to his location. In June he experienced similar symptoms along with hematuria in his brief. He was treated for a UTI and symptoms resolved. At this time he is not having any urinary symptoms, fever, or back pain but he is too confused to provide an accurate hx.    Past Medical History:  Diagnosis Date  . Abnormal gait   .  Adenocarcinoma of prostate ([place])   . Anxiety   . Arthritis    hands  . Balance problem   . Benzodiazepine dependence ([place])   . Blood transfusion without reported diagnosis    pt thinks had blood trnsfusion at prostate ca surgery   . Blurred vision   . Carotid atherosclerosis   . Cataract   . Depression, major   . Glaucoma   . Hearing loss   . High cholesterol   . Hypertension   . Knee pain, bilateral   . Lacunar infarction ([place])   . Lumbar radiculopathy   . Ophthalmic herpes zoster   . Prostate cancer ([place])   . Sacroiliitis ([place])   . Vertigo    Past Surgical History:  Procedure Laterality Date  . COLONOSCOPY    . HERNIA REPAIR    . PROSTATECTOMY      Allergies  Allergen Reactions  . Rofecoxib Swelling    REACTION: swelling- Vioxx name brand     Outpatient Encounter Medications as of 10/20/2019  Medication Sig  . acetaminophen (TYLENOL) 325 MG tablet Take 650 mg by mouth every 4 (four) hours as needed for mild pain.  Marland Kitchen ALPRAZolam (XANAX) 0.25 MG tablet Take 0.25 mg by mouth at bedtime. Special instructions: Schedule before dinner Q evening  . atorvastatin (LIPITOR) 20 MG tablet Take 1 tablet (20 mg total) by mouth daily at 6 PM.  . calcium carbonate (TUMS - DOSED IN MG ELEMENTAL CALCIUM) 500 MG chewable tablet Chew 2 tablets by mouth as needed for  indigestion.  . cholestyramine (QUESTRAN) 4 g packet Take 4 g by mouth 2 (two) times daily.   . clopidogrel (PLAVIX) 75 MG tablet Take 1 tablet (75 mg total) by mouth daily.  Marland Kitchen escitalopram (LEXAPRO) 10 MG tablet Take 5 mg by mouth daily. X 1 week then increase to 10 mg qd  . famotidine (PEPCID) 20 MG tablet Take 20 mg by mouth at bedtime.  Marland Kitchen ipratropium-albuterol (DUONEB) 0.5-2.5 (3) MG/3ML SOLN Take 3 mLs by nebulization every 6 (six) hours as needed.  . irbesartan (AVAPRO) 300 MG tablet Take 300 mg by mouth daily.  Marland Kitchen latanoprost (XALATAN) 0.005 % ophthalmic solution Place 1 drop into the right eye at bedtime.   . lidocaine  (LIDODERM) 5 % Place 1 patch onto the skin daily. Remove & Discard patch within 12 hours or as directed by MD ([name] taking differently: Place 1 patch onto the skin daily as needed. Remove & Discard patch within 12 hours or as directed by MD)  . metoprolol tartrate (LOPRESSOR) 25 MG tablet Take 12.5 mg by mouth 2 (two) times daily.  . pantoprazole (PROTONIX) 40 MG tablet Take 40 mg by mouth daily.  . polyethylene glycol (MIRALAX / GLYCOLAX) packet Take 17 g by mouth daily as needed for mild constipation.  . Probiotic Product (ALIGN PO) Take 1 capsule by mouth daily.  . Wheat Dextrin (BENEFIBER DRINK MIX PO) Take 10 mLs by mouth daily.   No facility-administered encounter medications on file as of 10/20/2019.    Review of Systems  Constitutional: Positive for activity change. Negative for appetite change, chills, diaphoresis and fever.  HENT: Negative for congestion.   Respiratory: Negative for cough and wheezing.   Cardiovascular: Negative for chest pain and leg swelling.  Gastrointestinal: Negative for abdominal pain, constipation, diarrhea, nausea and vomiting.  Genitourinary: Negative for difficulty urinating, dysuria, flank pain, frequency and urgency.  Musculoskeletal: Positive for gait problem. Negative for back pain, myalgias and neck pain.  Skin: Negative for rash.  Neurological: Positive for weakness.  Psychiatric/Behavioral: Positive for agitation, behavioral problems, confusion and dysphoric mood. Negative for hallucinations, self-injury, sleep disturbance and suicidal ideas. The [name] is nervous/anxious. The [name] is not hyperactive.     Immunization History  Administered Date(s) Administered  . Influenza, High Dose Seasonal PF 01/04/2017  . Influenza-Unspecified 12/10/2017, 01/16/2019  . Moderna SARS-COVID-2 Vaccination 04/11/2019, 04/29/2019  . Pneumococcal Conjugate-13 01/30/2014  . Pneumococcal Polysaccharide-23 11/13/2006  . Tdap 11/15/2007, 01/22/2019  . Zoster  11/15/2007   Pertinent  Health Maintenance Due  Topic Date Due  . INFLUENZA VACCINE  10/19/2019  . PNA vac Low Risk Adult  Completed   Fall Risk  05/07/2019 03/18/2019 10/23/2018 05/30/2018  Falls in the past year? 0 1 1 1   Number falls in past yr: 0 0 1 0  Injury with Fall? 0 1 0 -  Risk for fall due to : - Impaired balance/gait;History of fall(s);Impaired vision - -  Follow up - Falls evaluation completed;Falls prevention discussed - -   Functional Status Survey:    Vitals:   10/20/19 1622  BP: (!) 148/89  Pulse: 74  Resp: 20  Temp: (!) 96.8 F (36 C)  SpO2: 97%   There is no height or weight on file to calculate BMI. Physical Exam Vitals and nursing note reviewed.  Constitutional:      General: He is in acute distress (anxious).     Appearance: He is not diaphoretic.  HENT:     Head: Normocephalic and atraumatic.  Nose: Nose normal. No congestion.     Mouth/Throat:     Mouth: Mucous membranes are moist.     Pharynx: Oropharynx is clear. No oropharyngeal exudate.  Eyes:     Conjunctiva/sclera: Conjunctivae normal.     Pupils: Pupils are equal, round, and reactive to light.  Neck:     Thyroid: No thyromegaly.     Vascular: No JVD.     Trachea: No tracheal deviation.  Cardiovascular:     Rate and Rhythm: Normal rate and regular rhythm.     Heart sounds: No murmur heard.   Pulmonary:     Effort: Pulmonary effort is normal. No respiratory distress.     Breath sounds: Normal breath sounds. No wheezing.  Abdominal:     General: Bowel sounds are normal. There is no distension.     Palpations: Abdomen is soft.     Tenderness: There is no abdominal tenderness. There is no right CVA tenderness or left CVA tenderness.  Musculoskeletal:     Cervical back: Normal range of motion and neck supple.     Right lower leg: No edema.     Left lower leg: No edema.  Lymphadenopathy:     Cervical: No cervical adenopathy.  Skin:    General: Skin is warm and dry.  Neurological:      General: No focal deficit present.     Mental Status: He is alert.     Cranial Nerves: No cranial nerve deficit.     Comments: Confused. Oriented to self and location only   Psychiatric:     Comments: Easily angered     Labs reviewed: Recent Labs    06/11/19 0000 07/14/19 0000 07/14/19 1200 09/05/19 1535 09/11/19 1535  NA   < >  --  138 136* 135*  K   < >  --  4.1 4.3 4.3  CL   < >  --  102 102 98*  CO2   < >  --  21 21 25*  BUN   < >  --  15 14 15   CREATININE   < >  --  0.9 0.8 0.8  CALCIUM  --  10.0  --  10.3 10.7   < > = values in this interval not displayed.   Recent Labs    07/14/19 0000 07/14/19 1200  AST  --  14  ALT  --  10  ALBUMIN 4.2  --    Recent Labs    07/14/19 1200 09/05/19 1535  WBC 8.4 7.7  HGB 14.4 14.9  HCT 43 45  PLT 217 222   Lab Results  Component Value Date   TSH 3.16 07/14/2019   Lab Results  Component Value Date   HGBA1C 5.5 04/06/2018   Lab Results  Component Value Date   CHOL 160 04/07/2018   HDL 47 04/07/2018   LDLCALC 89 04/07/2018   TRIG 122 04/07/2018   CHOLHDL 3.4 04/07/2018    Significant Diagnostic Results in last 30 days:  No results found.  Assessment/Plan 1. Delirium with dementia Due to recent hematuria along with his acute decline will check UA C and S.   2. Weakness ?if this is due to possible infection vs. Progressive dementia  3. Generalized anxiety disorder He seemed to be doing better with Lexapro and in stable condition when I saw him on 7/22 but at this time is more anxious and has a dysphoric mood. If there is no improvement would consider additional treatment for mood  stabilization.     Family/ staff Communication: discussed with the resident and his wife Belenda Cruise, also discussed with his nurse Misti   Labs/tests ordered:  UA C and S

## 2019-10-23 DIAGNOSIS — N390 Urinary tract infection, site not specified: Secondary | ICD-10-CM | POA: Diagnosis not present

## 2019-10-28 DIAGNOSIS — R3129 Other microscopic hematuria: Secondary | ICD-10-CM | POA: Diagnosis not present

## 2019-10-28 DIAGNOSIS — Z8546 Personal history of malignant neoplasm of prostate: Secondary | ICD-10-CM | POA: Diagnosis not present

## 2019-10-31 ENCOUNTER — Non-Acute Institutional Stay (SKILLED_NURSING_FACILITY): Payer: Medicare Other | Admitting: Adult Health

## 2019-10-31 ENCOUNTER — Encounter: Payer: Self-pay | Admitting: Adult Health

## 2019-10-31 DIAGNOSIS — I10 Essential (primary) hypertension: Secondary | ICD-10-CM

## 2019-10-31 MED ORDER — ALPRAZOLAM 0.25 MG PO TABS: 0.2500 mg | ORAL_TABLET | Freq: Two times a day (BID) | ORAL | 1 refills | Status: DC | PRN

## 2019-10-31 NOTE — Progress Notes (Signed)
Location:  Occupational psychologist of Service:  SNF (31) Provider There are no exam notes on file for this visit.  [name] Curry, DO  Patient Care Team: [name] Curry, DO as PCP - General (Geriatric Medicine)  Extended Emergency Contact Information Primary Emergency Contact: Duffy,[name] Address: [place]          [place] [address] [name] Duffy of [place] Phone: [phone] Relation: Spouse Secondary Emergency Contact: [name] Duffy Mobile Phone: [phone] Relation: Nephew  Code Status:  Full code  Goals of care: Advanced Directive information Advanced Directives 08/19/2019  Does Patient Have a Medical Advance Directive? Yes  Type of Advance Directive [place]  Does patient want to make changes to medical advance directive? No - Patient declined  Copy of [place] in Chart? Yes - validated most recent copy scanned in chart (See row information)  Would patient like information on creating a medical advance directive? -     Chief Complaint  Patient presents with   Acute Visit    htn     HPI:  Pt is a 86 y.o. male seen today for an acute visit for htn. His nurse left a note that his bp is still high at times in the 209O systolic at times and up to 709 diastolic. He is not having any edema, sob, cp, doe, or other change of condition. He completed Cipro for UTI 10/31/2019. He continues with microscopic hematuria. He was seen by urology and the recommended follow up cysto.    Past Medical History:  Diagnosis Date   Abnormal gait    Adenocarcinoma of prostate ([place])    Anxiety    Arthritis    hands   Balance problem    Benzodiazepine dependence ([place])    Blood transfusion without reported diagnosis    pt thinks had blood trnsfusion at prostate ca surgery    Blurred vision    Carotid atherosclerosis    Cataract    Depression, major    Glaucoma    Hearing loss    High  cholesterol    Hypertension    Knee pain, bilateral    Lacunar infarction (HCC)    Lumbar radiculopathy    Ophthalmic herpes zoster    Prostate cancer (HCC)    Sacroiliitis (HCC)    Vertigo    Past Surgical History:  Procedure Laterality Date   COLONOSCOPY     HERNIA REPAIR     PROSTATECTOMY      Allergies  Allergen Reactions   Rofecoxib Swelling    REACTION: swelling- Vioxx name brand     Outpatient Encounter Medications as of 10/31/2019  Medication Sig   ALPRAZolam (XANAX) 0.25 MG tablet Take 0.25 mg by mouth at bedtime. Special instructions: Schedule before dinner Q evening   ALPRAZolam (XANAX) 0.25 MG tablet Take 1 tablet (0.25 mg total) by mouth 2 (two) times daily as needed for anxiety.   atorvastatin (LIPITOR) 20 MG tablet Take 1 tablet (20 mg total) by mouth daily at 6 PM.   calcium carbonate (TUMS - DOSED IN MG ELEMENTAL CALCIUM) 500 MG chewable tablet Chew 2 tablets by mouth as needed for indigestion.   cholestyramine (QUESTRAN) 4 g packet Take 4 g by mouth 2 (two) times daily.    clopidogrel (PLAVIX) 75 MG tablet Take 1 tablet (75 mg total) by mouth daily.   escitalopram (LEXAPRO) 10 MG tablet Take 5 mg by mouth daily. X 1 week then increase  to 10 mg qd   famotidine (PEPCID) 20 MG tablet Take 20 mg by mouth at bedtime.   ipratropium-albuterol (DUONEB) 0.5-2.5 (3) MG/3ML SOLN Take 3 mLs by nebulization every 6 (six) hours as needed.   irbesartan (AVAPRO) 300 MG tablet Take 300 mg by mouth daily.   latanoprost (XALATAN) 0.005 % ophthalmic solution Place 1 drop into the right eye at bedtime.    lidocaine (LIDODERM) 5 % Place 1 patch onto the skin daily. Remove & Discard patch within 12 hours or as directed by MD (Patient taking differently: Place 1 patch onto the skin daily as needed. Remove & Discard patch within 12 hours or as directed by MD)   metoprolol tartrate (LOPRESSOR) 25 MG tablet Take 25 mg by mouth 2 (two) times daily.    pantoprazole  (PROTONIX) 40 MG tablet Take 40 mg by mouth daily.   polyethylene glycol (MIRALAX / GLYCOLAX) packet Take 17 g by mouth daily as needed for mild constipation.   Probiotic Product (ALIGN PO) Take 1 capsule by mouth daily.   Wheat Dextrin (BENEFIBER DRINK MIX PO) Take 10 mLs by mouth daily.   [DISCONTINUED] ALPRAZolam (XANAX) 0.25 MG tablet Take 0.25 mg by mouth 2 (two) times daily as needed for anxiety.   acetaminophen (TYLENOL) 325 MG tablet Take 650 mg by mouth every 4 (four) hours as needed for mild pain.   No facility-administered encounter medications on file as of 10/31/2019.    Review of Systems  Constitutional: Negative for activity change, appetite change, chills, diaphoresis, fatigue, fever and unexpected weight change.  Respiratory: Negative for cough, shortness of breath, wheezing and stridor.   Cardiovascular: Negative for chest pain, palpitations and leg swelling.  Gastrointestinal: Negative for abdominal distention, abdominal pain, constipation and diarrhea.  Genitourinary: Negative for difficulty urinating and dysuria.  Musculoskeletal: Positive for gait problem. Negative for arthralgias, back pain, joint swelling and myalgias.  Neurological: Negative for dizziness, seizures, syncope, facial asymmetry, speech difficulty, weakness and headaches.  Hematological: Negative for adenopathy. Does not bruise/bleed easily.  Psychiatric/Behavioral: Positive for confusion. Negative for agitation and behavioral problems. The patient is nervous/anxious.     Immunization History  Administered Date(s) Administered   Influenza, High Dose Seasonal PF 01/04/2017   Influenza-Unspecified 12/10/2017, 01/16/2019   Moderna SARS-COVID-2 Vaccination 04/11/2019, 04/29/2019   Pneumococcal Conjugate-13 01/30/2014   Pneumococcal Polysaccharide-23 11/13/2006   Tdap 11/15/2007, 01/22/2019   Zoster 11/15/2007   Pertinent  Health Maintenance Due  Topic Date Due   INFLUENZA VACCINE   10/19/2019   PNA vac Low Risk Adult  Completed   Fall Risk  05/07/2019 03/18/2019 10/23/2018 05/30/2018  Falls in the past year? 0 1 1 1   Number falls in past yr: 0 0 1 0  Injury with Fall? 0 1 0 -  Risk for fall due to : - Impaired balance/gait;History of fall(s);Impaired vision - -  Follow up - Falls evaluation completed;Falls prevention discussed - -   Functional Status Survey:    There were no vitals filed for this visit. There is no height or weight on file to calculate BMI. Physical Exam Vitals and nursing note reviewed.  Constitutional:      General: He is not in acute distress.    Appearance: He is not diaphoretic.  HENT:     Head: Normocephalic and atraumatic.  Neck:     Thyroid: No thyromegaly.     Vascular: No JVD.     Trachea: No tracheal deviation.  Cardiovascular:     Rate and  Rhythm: Normal rate and regular rhythm.     Heart sounds: No murmur heard.   Pulmonary:     Effort: Pulmonary effort is normal. No respiratory distress.     Breath sounds: Normal breath sounds. No wheezing.  Abdominal:     General: Bowel sounds are normal. There is no distension.     Palpations: Abdomen is soft.     Tenderness: There is no abdominal tenderness.  Lymphadenopathy:     Cervical: No cervical adenopathy.  Skin:    General: Skin is warm and dry.  Neurological:     Mental Status: He is alert.     Comments: Oriented X 2     Labs reviewed: Recent Labs    09/05/19 1535 09/11/19 1535 10/13/19 0000  NA 136* 135* 142  K 4.3 4.3 4.0  CL 102 98* 103  CO2 21 25* 24*  BUN 14 15 12   CREATININE 0.8 0.8 0.8  CALCIUM 10.3 10.7 10.2   Recent Labs    07/14/19 0000 07/14/19 1200  AST  --  14  ALT  --  10  ALBUMIN 4.2  --    Recent Labs    07/14/19 1200 09/05/19 1535  WBC 8.4 7.7  HGB 14.4 14.9  HCT 43 45  PLT 217 222   Lab Results  Component Value Date   TSH 3.16 07/14/2019   Lab Results  Component Value Date   HGBA1C 5.5 04/06/2018   Lab Results    Component Value Date   CHOL 160 04/07/2018   HDL 47 04/07/2018   LDLCALC 89 04/07/2018   TRIG 122 04/07/2018   CHOLHDL 3.4 04/07/2018    Significant Diagnostic Results in last 30 days:  No results found.  Assessment/Plan 1. Essential hypertension Increase metoprolol to 25 mg bid  Continue to monitor bp/hr goal 150/90    Family/ staff Communication: resident and his nurse  Labs/tests ordered:  NA

## 2019-11-03 ENCOUNTER — Other Ambulatory Visit: Payer: Self-pay | Admitting: Adult Health

## 2019-11-03 ENCOUNTER — Non-Acute Institutional Stay (SKILLED_NURSING_FACILITY): Payer: Medicare Other | Admitting: Adult Health

## 2019-11-03 DIAGNOSIS — F0151 Vascular dementia with behavioral disturbance: Secondary | ICD-10-CM

## 2019-11-03 DIAGNOSIS — Z9189 Other specified personal risk factors, not elsewhere classified: Secondary | ICD-10-CM | POA: Diagnosis not present

## 2019-11-03 DIAGNOSIS — Z20828 Contact with and (suspected) exposure to other viral communicable diseases: Secondary | ICD-10-CM | POA: Diagnosis not present

## 2019-11-03 DIAGNOSIS — F01518 Vascular dementia, unspecified severity, with other behavioral disturbance: Secondary | ICD-10-CM

## 2019-11-03 MED ORDER — ALPRAZOLAM 0.5 MG PO TABS
0.5000 mg | ORAL_TABLET | Freq: Every day | ORAL | 0 refills | Status: DC
Start: 2019-11-03 — End: 2020-01-30

## 2019-11-03 NOTE — Progress Notes (Signed)
Location:  Occupational psychologist of Service:  SNF (31) Provider:   Cindi Carbon, ANP [place] [phone]   Gayland Curry, DO  Patient Care Team: Gayland Curry, DO as PCP - General (Geriatric Medicine)  Extended Emergency Contact Information Primary Emergency Contact: Willy,[name] Address: [place]          [place] [address] [name] of [place] Phone: [phone] Relation: Spouse Secondary Emergency Contact: [name] Mobile Phone: [phone] Relation: Nephew  Code Status: Full code  Goals of care: Advanced Directive information Advanced Directives 08/19/2019  Does Patient Have a Medical Advance Directive? Yes  Type of Advance Directive [place]  Does patient want to make changes to medical advance directive? No - Patient declined  Copy of [place] in Chart? Yes - validated most recent copy scanned in chart (See row information)  Would patient like information on creating a medical advance directive? -     Chief Complaint  Patient presents with  . Acute Visit    confusion, behaviors     HPI:  Pt is a 86 y.o. male seen today for an acute visit for confusion and behaviors. Mr. Huaracha has a hx of depression/anxiety, and vascular dementia. Over the past few months he has become more confused, particularly in the evening hrs. He was treated recently for a UTI which resolved. He continues with microscopic hematuria and will have a cystoscopy soon. The SBAR that was given reports that he cries for no reason, growls at times, yells and becomes easily angered/agitated. His vitals are WNL. For my visit he has no acute complaints and does not remember the above hx.     Past Medical History:  Diagnosis Date  . Abnormal gait   . Adenocarcinoma of prostate ([place])   . Anxiety   . Arthritis    hands  . Balance problem   . Benzodiazepine dependence ([place])   . Blood  transfusion without reported diagnosis    pt thinks had blood trnsfusion at prostate ca surgery   . Blurred vision   . Carotid atherosclerosis   . Cataract   . Depression, major   . Glaucoma   . Hearing loss   . High cholesterol   . Hypertension   . Knee pain, bilateral   . Lacunar infarction ([place])   . Lumbar radiculopathy   . Ophthalmic herpes zoster   . Prostate cancer ([place])   . Sacroiliitis ([place])   . Vertigo    Past Surgical History:  Procedure Laterality Date  . COLONOSCOPY    . HERNIA REPAIR    . PROSTATECTOMY      Allergies  Allergen Reactions  . Rofecoxib Swelling    REACTION: swelling- Vioxx name brand     Outpatient Encounter Medications as of 11/03/2019  Medication Sig  . acetaminophen (TYLENOL) 325 MG tablet Take 650 mg by mouth every 4 (four) hours as needed for mild pain.  Marland Kitchen ALPRAZolam (XANAX) 0.25 MG tablet Take 1 tablet (0.25 mg total) by mouth 2 (two) times daily as needed for anxiety.  . ALPRAZolam (XANAX) 0.5 MG tablet Take 1 tablet (0.5 mg total) by mouth at bedtime.  Marland Kitchen atorvastatin (LIPITOR) 20 MG tablet Take 1 tablet (20 mg total) by mouth daily at 6 PM.  . calcium carbonate (TUMS - DOSED IN MG ELEMENTAL CALCIUM) 500 MG chewable tablet Chew 2 tablets by mouth as needed for indigestion.  . cholestyramine (QUESTRAN) 4  g packet Take 4 g by mouth 2 (two) times daily.   . clopidogrel (PLAVIX) 75 MG tablet Take 1 tablet (75 mg total) by mouth daily.  Marland Kitchen escitalopram (LEXAPRO) 10 MG tablet Take 5 mg by mouth daily. X 1 week then increase to 10 mg qd  . famotidine (PEPCID) 20 MG tablet Take 20 mg by mouth at bedtime.  Marland Kitchen ipratropium-albuterol (DUONEB) 0.5-2.5 (3) MG/3ML SOLN Take 3 mLs by nebulization every 6 (six) hours as needed.  . irbesartan (AVAPRO) 300 MG tablet Take 300 mg by mouth daily.  Marland Kitchen latanoprost (XALATAN) 0.005 % ophthalmic solution Place 1 drop into the right eye at bedtime.   . lidocaine (LIDODERM) 5 % Place 1 patch onto the skin daily. Remove &  Discard patch within 12 hours or as directed by MD (Patient taking differently: Place 1 patch onto the skin daily as needed. Remove & Discard patch within 12 hours or as directed by MD)  . metoprolol tartrate (LOPRESSOR) 25 MG tablet Take 25 mg by mouth 2 (two) times daily.   . pantoprazole (PROTONIX) 40 MG tablet Take 40 mg by mouth daily.  . polyethylene glycol (MIRALAX / GLYCOLAX) packet Take 17 g by mouth daily as needed for mild constipation.  . Probiotic Product (ALIGN PO) Take 1 capsule by mouth daily.  . Wheat Dextrin (BENEFIBER DRINK MIX PO) Take 10 mLs by mouth daily.   No facility-administered encounter medications on file as of 11/03/2019.    Review of Systems  Constitutional: Negative for activity change, appetite change, chills, diaphoresis, fatigue, fever and unexpected weight change.  Respiratory: Negative for cough, shortness of breath, wheezing and stridor.   Cardiovascular: Negative for chest pain, palpitations and leg swelling.  Gastrointestinal: Negative for abdominal distention, abdominal pain, constipation and diarrhea.  Genitourinary: Negative for difficulty urinating and dysuria.  Musculoskeletal: Positive for gait problem. Negative for arthralgias, back pain, joint swelling and myalgias.  Neurological: Negative for dizziness, seizures, syncope, facial asymmetry, speech difficulty, weakness and headaches.  Hematological: Negative for adenopathy. Does not bruise/bleed easily.  Psychiatric/Behavioral: Positive for agitation, behavioral problems and confusion. Negative for decreased concentration, dysphoric mood, hallucinations, self-injury, sleep disturbance and suicidal ideas. The patient is nervous/anxious. The patient is not hyperactive.     Immunization History  Administered Date(s) Administered  . Influenza, High Dose Seasonal PF 01/04/2017  . Influenza-Unspecified 12/10/2017, 01/16/2019  . Moderna SARS-COVID-2 Vaccination 04/11/2019, 04/29/2019  . Pneumococcal  Conjugate-13 01/30/2014  . Pneumococcal Polysaccharide-23 11/13/2006  . Tdap 11/15/2007, 01/22/2019  . Zoster 11/15/2007   Pertinent  Health Maintenance Due  Topic Date Due  . INFLUENZA VACCINE  10/19/2019  . PNA vac Low Risk Adult  Completed   Fall Risk  05/07/2019 03/18/2019 10/23/2018 05/30/2018  Falls in the past year? 0 1 1 1   Number falls in past yr: 0 0 1 0  Injury with Fall? 0 1 0 -  Risk for fall due to : - Impaired balance/gait;History of fall(s);Impaired vision - -  Follow up - Falls evaluation completed;Falls prevention discussed - -   Functional Status Survey:    Vitals:   11/06/19 1600  BP: 132/76  Pulse: 87  Resp: 20  Temp: 98.3 F (36.8 C)  SpO2: 94%   There is no height or weight on file to calculate BMI. Physical Exam Vitals and nursing note reviewed.  Cardiovascular:     Rate and Rhythm: Normal rate and regular rhythm.  Pulmonary:     Effort: Pulmonary effort is normal.  Breath sounds: Normal breath sounds.  Abdominal:     General: Bowel sounds are normal.     Palpations: Abdomen is soft.     Tenderness: There is no right CVA tenderness or left CVA tenderness.  Musculoskeletal:     Right lower leg: No edema.     Left lower leg: No edema.  Neurological:     General: No focal deficit present.     Mental Status: He is alert. Mental status is at baseline.  Psychiatric:        Mood and Affect: Mood normal.     Labs reviewed: Recent Labs    09/05/19 1535 09/11/19 1535 10/13/19 0000  NA 136* 135* 142  K 4.3 4.3 4.0  CL 102 98* 103  CO2 21 25* 24*  BUN 14 15 12   CREATININE 0.8 0.8 0.8  CALCIUM 10.3 10.7 10.2   Recent Labs    07/14/19 0000 07/14/19 1200  AST  --  14  ALT  --  10  ALBUMIN 4.2  --    Recent Labs    07/14/19 1200 09/05/19 1535  WBC 8.4 7.7  HGB 14.4 14.9  HCT 43 45  PLT 217 222   Lab Results  Component Value Date   TSH 3.16 07/14/2019   Lab Results  Component Value Date   HGBA1C 5.5 04/06/2018   Lab  Results  Component Value Date   CHOL 160 04/07/2018   HDL 47 04/07/2018   LDLCALC 89 04/07/2018   TRIG 122 04/07/2018   CHOLHDL 3.4 04/07/2018    Significant Diagnostic Results in last 30 days:  No results found.  Assessment/Plan 1. Vascular dementia with behavior disturbance [facility]) Mr. Shepperson was pleasant and had no s/s of anxiety or agitation during my visit. No s/s of depression either. His symptoms tend to occur in the evening, indicating sundowning. I have increased his Xanax to 0.5 mg each evening. If there is no improvement or worsening, recommend a psych consult due to his long standing hx of depression/anxiety   Family/ staff Communication: resident and nurse  Labs/tests ordered:  NA

## 2019-11-03 NOTE — Progress Notes (Signed)
Pt having increased agitation in the afternoon time frame. Tends to be easily angered, growls at staff, and attempted to kick a staff member. Evening xanax increased. Recommend psych eval due to long standing hx of anxiety and depression, also with dementia.

## 2019-11-06 ENCOUNTER — Encounter: Payer: Self-pay | Admitting: Adult Health

## 2019-11-10 DIAGNOSIS — Z20828 Contact with and (suspected) exposure to other viral communicable diseases: Secondary | ICD-10-CM | POA: Diagnosis not present

## 2019-11-10 DIAGNOSIS — Z9189 Other specified personal risk factors, not elsewhere classified: Secondary | ICD-10-CM | POA: Diagnosis not present

## 2019-11-11 DIAGNOSIS — N281 Cyst of kidney, acquired: Secondary | ICD-10-CM | POA: Diagnosis not present

## 2019-11-11 DIAGNOSIS — Z8546 Personal history of malignant neoplasm of prostate: Secondary | ICD-10-CM | POA: Diagnosis not present

## 2019-11-11 DIAGNOSIS — N200 Calculus of kidney: Secondary | ICD-10-CM | POA: Diagnosis not present

## 2019-11-11 DIAGNOSIS — R3129 Other microscopic hematuria: Secondary | ICD-10-CM | POA: Diagnosis not present

## 2019-11-28 ENCOUNTER — Encounter: Payer: Self-pay | Admitting: Adult Health

## 2019-11-28 ENCOUNTER — Non-Acute Institutional Stay (SKILLED_NURSING_FACILITY): Payer: Medicare Other | Admitting: Adult Health

## 2019-11-28 DIAGNOSIS — H0012 Chalazion right lower eyelid: Secondary | ICD-10-CM | POA: Diagnosis not present

## 2019-11-28 NOTE — Progress Notes (Signed)
Location:  Occupational psychologist of Service:  SNF (31) Provider:   Cindi Carbon, ANP [place] [phone]   Gayland Curry, DO  Patient Care Team: Gayland Curry, DO as PCP - General (Geriatric Medicine)  Extended Emergency Contact Information Primary Emergency Contact: Zeiger,[name] Address: [place]          [place] [address] [name] of [place] Phone: [phone] Relation: Spouse Secondary Emergency Contact: [name] Mobile Phone: [phone] Relation: Nephew  Code Status: full code Goals of care: Advanced Directive information Advanced Directives 08/19/2019  Does Patient Have a Medical Advance Directive? Yes  Type of Advance Directive [place]  Does patient want to make changes to medical advance directive? No - Patient declined  Copy of [place] in Chart? Yes - validated most recent copy scanned in chart (See row information)  Would patient like information on creating a medical advance directive? -     Chief Complaint  Patient presents with  . Acute Visit    right eye swelling    HPI:  Pt is a 86 y.o. male seen today for an acute visit for right eye redness and mild swelling present for 1 day. No pain or change in vision or drainage.   Past Medical History:  Diagnosis Date  . Abnormal gait   . Adenocarcinoma of prostate ([place])   . Anxiety   . Arthritis    hands  . Balance problem   . Benzodiazepine dependence ([place])   . Blood transfusion without reported diagnosis    pt thinks had blood trnsfusion at prostate ca surgery   . Blurred vision   . Carotid atherosclerosis   . Cataract   . Depression, major   . Glaucoma   . Hearing loss   . High cholesterol   . Hypertension   . Knee pain, bilateral   . Lacunar infarction ([place])   . Lumbar radiculopathy   . Ophthalmic herpes zoster   . Prostate cancer ([place])   . Sacroiliitis ([place])   . Vertigo     Past Surgical History:  Procedure Laterality Date  . COLONOSCOPY    . HERNIA REPAIR    . PROSTATECTOMY      Allergies  Allergen Reactions  . Rofecoxib Swelling    REACTION: swelling- Vioxx name brand     Outpatient Encounter Medications as of 11/28/2019  Medication Sig  . acetaminophen (TYLENOL) 325 MG tablet Take 650 mg by mouth every 4 (four) hours as needed for mild pain.  Marland Kitchen ALPRAZolam (XANAX) 0.25 MG tablet Take 1 tablet (0.25 mg total) by mouth 2 (two) times daily as needed for anxiety.  . ALPRAZolam (XANAX) 0.5 MG tablet Take 1 tablet (0.5 mg total) by mouth at bedtime.  Marland Kitchen atorvastatin (LIPITOR) 20 MG tablet Take 1 tablet (20 mg total) by mouth daily at 6 PM.  . calcium carbonate (TUMS - DOSED IN MG ELEMENTAL CALCIUM) 500 MG chewable tablet Chew 2 tablets by mouth as needed for indigestion.  . cholestyramine (QUESTRAN) 4 g packet Take 4 g by mouth 2 (two) times daily.   . clopidogrel (PLAVIX) 75 MG tablet Take 1 tablet (75 mg total) by mouth daily.  Marland Kitchen escitalopram (LEXAPRO) 10 MG tablet Take 5 mg by mouth daily. X 1 week then increase to 10 mg qd  . famotidine (PEPCID) 20 MG tablet Take 20 mg by mouth at bedtime.  Marland Kitchen ipratropium-albuterol (DUONEB) 0.5-2.5 (3) MG/3ML SOLN Take  3 mLs by nebulization every 6 (six) hours as needed.  . irbesartan (AVAPRO) 300 MG tablet Take 300 mg by mouth daily.  Marland Kitchen latanoprost (XALATAN) 0.005 % ophthalmic solution Place 1 drop into the right eye at bedtime.   . lidocaine (LIDODERM) 5 % Place 1 patch onto the skin daily. Remove & Discard patch within 12 hours or as directed by MD (Patient taking differently: Place 1 patch onto the skin daily as needed. Remove & Discard patch within 12 hours or as directed by MD)  . metoprolol tartrate (LOPRESSOR) 25 MG tablet Take 25 mg by mouth 2 (two) times daily.   . pantoprazole (PROTONIX) 40 MG tablet Take 40 mg by mouth daily.  . polyethylene glycol (MIRALAX / GLYCOLAX) packet Take 17 g by mouth daily as needed  for mild constipation.  . Probiotic Product (ALIGN PO) Take 1 capsule by mouth daily.  . Wheat Dextrin (BENEFIBER DRINK MIX PO) Take 10 mLs by mouth daily.   No facility-administered encounter medications on file as of 11/28/2019.    Review of Systems  Eyes: Positive for redness. Negative for photophobia, pain, discharge, itching and visual disturbance.  Psychiatric/Behavioral: Positive for behavioral problems and confusion.    Immunization History  Administered Date(s) Administered  . Influenza, High Dose Seasonal PF 01/04/2017  . Influenza-Unspecified 12/10/2017, 01/16/2019  . Moderna SARS-COVID-2 Vaccination 04/11/2019, 04/29/2019  . Pneumococcal Conjugate-13 01/30/2014  . Pneumococcal Polysaccharide-23 11/13/2006  . Tdap 11/15/2007, 01/22/2019  . Zoster 11/15/2007   Pertinent  Health Maintenance Due  Topic Date Due  . INFLUENZA VACCINE  10/19/2019  . PNA vac Low Risk Adult  Completed   Fall Risk  05/07/2019 03/18/2019 10/23/2018 05/30/2018  Falls in the past year? 0 1 1 1   Number falls in past yr: 0 0 1 0  Injury with Fall? 0 1 0 -  Risk for fall due to : - Impaired balance/gait;History of fall(s);Impaired vision - -  Follow up - Falls evaluation completed;Falls prevention discussed - -   Functional Status Survey:    Vitals:   11/28/19 1449  BP: (!) 144/78  Pulse: (!) 56  Resp: 18  Temp: 98 F (36.7 C)  SpO2: 94%   There is no height or weight on file to calculate BMI. Physical Exam HENT:     Head: Normocephalic and atraumatic.     Nose: Nose normal. No congestion.  Eyes:     General:        Right eye: No discharge.        Left eye: No discharge.     Conjunctiva/sclera: Conjunctivae normal.     Right eye: Right conjunctiva is not injected. No chemosis.    Left eye: Left conjunctiva is not injected. No chemosis.    Pupils: Pupils are equal, round, and reactive to light.   Neurological:     Mental Status: He is alert.     Labs reviewed: Recent Labs     09/05/19 1535 09/11/19 1535 10/13/19 0000  NA 136* 135* 142  K 4.3 4.3 4.0  CL 102 98* 103  CO2 21 25* 24*  BUN 14 15 12   CREATININE 0.8 0.8 0.8  CALCIUM 10.3 10.7 10.2   Recent Labs    07/14/19 0000 07/14/19 1200  AST  --  14  ALT  --  10  ALBUMIN 4.2  --    Recent Labs    07/14/19 1200 09/05/19 1535  WBC 8.4 7.7  HGB 14.4 14.9  HCT 43 45  PLT 217 222   Lab Results  Component Value Date   TSH 3.16 07/14/2019   Lab Results  Component Value Date   HGBA1C 5.5 04/06/2018   Lab Results  Component Value Date   CHOL 160 04/07/2018   HDL 47 04/07/2018   LDLCALC 89 04/07/2018   TRIG 122 04/07/2018   CHOLHDL 3.4 04/07/2018    Significant Diagnostic Results in last 30 days:  No results found.  Assessment/Plan 1. Chalazion of right lower eyelid Warm compresses qid x 72 hrs, monitor and report if no improvement or worsening  Family/ staff Communication: nurse  Labs/tests ordered:  NA

## 2019-11-29 ENCOUNTER — Telehealth: Payer: Self-pay | Admitting: Adult Health

## 2019-11-29 MED ORDER — ALPRAZOLAM 0.25 MG PO TABS: 0.5000 mg | ORAL_TABLET | Freq: Three times a day (TID) | ORAL | 1 refills | Status: DC | PRN

## 2019-11-29 NOTE — Telephone Encounter (Signed)
Nursing supervisor Katharine Look called to report that Mr. Cancel is showing signs of increased aggression. He has been barking and growling at the staff and is very agitated and angry. They are requesting additional medications. He has received 0.25 mg of xanax and Vistaril 25 mg with no relief. Gave an order to change to xanax to 0.5 mg tid prn and report if no improvement. A geriatric psych consult is pending.

## 2019-12-09 DIAGNOSIS — R4189 Other symptoms and signs involving cognitive functions and awareness: Secondary | ICD-10-CM | POA: Diagnosis not present

## 2019-12-09 DIAGNOSIS — M2559 Pain in other specified joint: Secondary | ICD-10-CM | POA: Diagnosis not present

## 2019-12-09 DIAGNOSIS — M6389 Disorders of muscle in diseases classified elsewhere, multiple sites: Secondary | ICD-10-CM | POA: Diagnosis not present

## 2019-12-09 DIAGNOSIS — R296 Repeated falls: Secondary | ICD-10-CM | POA: Diagnosis not present

## 2019-12-09 DIAGNOSIS — R278 Other lack of coordination: Secondary | ICD-10-CM | POA: Diagnosis not present

## 2019-12-09 DIAGNOSIS — R293 Abnormal posture: Secondary | ICD-10-CM | POA: Diagnosis not present

## 2019-12-09 DIAGNOSIS — R2689 Other abnormalities of gait and mobility: Secondary | ICD-10-CM | POA: Diagnosis not present

## 2019-12-10 DIAGNOSIS — R278 Other lack of coordination: Secondary | ICD-10-CM | POA: Diagnosis not present

## 2019-12-10 DIAGNOSIS — R296 Repeated falls: Secondary | ICD-10-CM | POA: Diagnosis not present

## 2019-12-10 DIAGNOSIS — R293 Abnormal posture: Secondary | ICD-10-CM | POA: Diagnosis not present

## 2019-12-10 DIAGNOSIS — R4189 Other symptoms and signs involving cognitive functions and awareness: Secondary | ICD-10-CM | POA: Diagnosis not present

## 2019-12-10 DIAGNOSIS — M6389 Disorders of muscle in diseases classified elsewhere, multiple sites: Secondary | ICD-10-CM | POA: Diagnosis not present

## 2019-12-10 DIAGNOSIS — M2559 Pain in other specified joint: Secondary | ICD-10-CM | POA: Diagnosis not present

## 2019-12-11 DIAGNOSIS — R278 Other lack of coordination: Secondary | ICD-10-CM | POA: Diagnosis not present

## 2019-12-11 DIAGNOSIS — R4189 Other symptoms and signs involving cognitive functions and awareness: Secondary | ICD-10-CM | POA: Diagnosis not present

## 2019-12-11 DIAGNOSIS — R296 Repeated falls: Secondary | ICD-10-CM | POA: Diagnosis not present

## 2019-12-11 DIAGNOSIS — M6389 Disorders of muscle in diseases classified elsewhere, multiple sites: Secondary | ICD-10-CM | POA: Diagnosis not present

## 2019-12-11 DIAGNOSIS — M2559 Pain in other specified joint: Secondary | ICD-10-CM | POA: Diagnosis not present

## 2019-12-11 DIAGNOSIS — R293 Abnormal posture: Secondary | ICD-10-CM | POA: Diagnosis not present

## 2019-12-12 ENCOUNTER — Encounter: Payer: Self-pay | Admitting: Adult Health

## 2019-12-12 ENCOUNTER — Non-Acute Institutional Stay (SKILLED_NURSING_FACILITY): Payer: Medicare Other | Admitting: Adult Health

## 2019-12-12 DIAGNOSIS — F01518 Vascular dementia, unspecified severity, with other behavioral disturbance: Secondary | ICD-10-CM

## 2019-12-12 DIAGNOSIS — F0151 Vascular dementia with behavioral disturbance: Secondary | ICD-10-CM | POA: Diagnosis not present

## 2019-12-12 DIAGNOSIS — R152 Fecal urgency: Secondary | ICD-10-CM

## 2019-12-12 DIAGNOSIS — I69319 Unspecified symptoms and signs involving cognitive functions following cerebral infarction: Secondary | ICD-10-CM | POA: Diagnosis not present

## 2019-12-12 DIAGNOSIS — R159 Full incontinence of feces: Secondary | ICD-10-CM | POA: Diagnosis not present

## 2019-12-12 DIAGNOSIS — F411 Generalized anxiety disorder: Secondary | ICD-10-CM | POA: Diagnosis not present

## 2019-12-12 DIAGNOSIS — K449 Diaphragmatic hernia without obstruction or gangrene: Secondary | ICD-10-CM | POA: Diagnosis not present

## 2019-12-12 DIAGNOSIS — I10 Essential (primary) hypertension: Secondary | ICD-10-CM | POA: Diagnosis not present

## 2019-12-12 NOTE — Progress Notes (Signed)
Location:  [facility]up[facility] psychologist of Service:  SNF (31) Provider:   Cindi Carbon, ANP [place] [phone]   Gayland Curry, DO  Patient Care Team: Gayland Curry, DO as PCP - General (Geriatric Medicine)  Extended Emergency Contact Information Primary Emergency Contact: Duffy,[name] Address: [place]           [address] [name] [place] Phone: [phone] Relation: Spouse Secondary Emergency Contact: [name] Duffy Mobile Phone: [phone] Relation: Nephew  Code Status:  DNR Goals of care: Advanced Directive information Advanced Directives 08/19/2019  Does Patient Have a Medical Advance Directive? Yes  Type of Advance Directive [place]  Does patient want to make changes to medical advance directive? No - Patient declined  Copy of [place] in Chart? Yes - validated most recent copy scanned in chart (See row information)  Would patient like information on creating a medical advance directive? -     Chief Complaint  Patient presents with  . Medical Management of Chronic Issues    HPI:  Pt is a 86 y.o. male seen today for medical management of chronic diseases.   [name] Duffy has vascular dementia which seems to be progressing with sun downing and increased confusion and agitation in the afternoon. On 9/11 the oncall psc provider (me) was called due to aggression, yelling, and barking and xanax prn was prescribed. He has not used it in the past week but it helped at the time per the staff. He is on the list for [facility] to eval due to his long standing hx of anxiety/depression now confounded by dementia. He is also having progressive weakness and needs a more supportive chair. OT is helping with this as well as positioning. For my visit he is calm, pleasant, and has no acute complaints.    Past Medical History:  Diagnosis Date  . Abnormal gait   . Adenocarcinoma of  prostate ([place])   . Anxiety   . Arthritis    hands  . Balance problem   . Benzodiazepine dependence ([place])   . Blood transfusion without reported diagnosis    pt thinks had blood trnsfusion at prostate ca surgery   . Blurred vision   . Carotid atherosclerosis   . Cataract   . Depression, major   . Glaucoma   . Hearing loss   . High cholesterol   . Hypertension   . Knee pain, bilateral   . Lacunar infarction ([place])   . Lumbar radiculopathy   . Ophthalmic herpes zoster   . Prostate cancer ([place])   . Sacroiliitis ([place])   . Vertigo    Past Surgical History:  Procedure Laterality Date  . COLONOSCOPY    . HERNIA REPAIR    . PROSTATECTOMY      Allergies  Allergen Reactions  . Rofecoxib Swelling    REACTION: swelling- Vioxx name brand     Outpatient Encounter Medications as of 12/12/2019  Medication Sig  . acetaminophen (TYLENOL) 325 MG tablet Take 650 mg by mouth every 4 (four) hours as needed for mild pain.  Marland Kitchen ALPRAZolam (XANAX) 0.25 MG tablet Take 2 tablets (0.5 mg total) by mouth 3 (three) times daily as needed for anxiety.  . ALPRAZolam (XANAX) 0.5 MG tablet Take 1 tablet (0.5 mg total) by mouth at bedtime.  . calcium carbonate (TUMS - DOSED IN MG ELEMENTAL CALCIUM) 500 MG chewable tablet Chew 2 tablets by mouth as needed for indigestion.  Marland Kitchen  cholestyramine (QUESTRAN) 4 g packet Take 4 g by mouth 2 (two) times daily.   . clopidogrel (PLAVIX) 75 MG tablet Take 1 tablet (75 mg total) by mouth daily.  Marland Kitchen escitalopram (LEXAPRO) 10 MG tablet Take 5 mg by mouth daily. X 1 week then increase to 10 mg qd  . famotidine (PEPCID) 20 MG tablet Take 20 mg by mouth at bedtime.  Marland Kitchen ipratropium-albuterol (DUONEB) 0.5-2.5 (3) MG/3ML SOLN Take 3 mLs by nebulization every 6 (six) hours as needed.  . irbesartan (AVAPRO) 300 MG tablet Take 300 mg by mouth daily.  Marland Kitchen latanoprost (XALATAN) 0.005 % ophthalmic solution Place 1 drop into the right eye at bedtime.   . lidocaine (LIDODERM) 5 % Place 1  patch onto the skin daily. Remove & Discard patch within 12 hours or as directed by MD (Patient taking differently: Place 1 patch onto the skin daily as needed. Remove & Discard patch within 12 hours or as directed by MD)  . metoprolol tartrate (LOPRESSOR) 25 MG tablet Take 25 mg by mouth 2 (two) times daily.   . pantoprazole (PROTONIX) 40 MG tablet Take 40 mg by mouth daily.  . polyethylene glycol (MIRALAX / GLYCOLAX) packet Take 17 g by mouth daily as needed for mild constipation.  . Probiotic Product (ALIGN PO) Take 1 capsule by mouth daily.  . Wheat Dextrin (BENEFIBER DRINK MIX PO) Take 10 mLs by mouth daily.  Marland Kitchen atorvastatin (LIPITOR) 20 MG tablet Take 1 tablet (20 mg total) by mouth daily at 6 PM.   No facility-administered encounter medications on file as of 12/12/2019.    Review of Systems  Constitutional: Negative for activity change, appetite change, chills, diaphoresis, fatigue, fever and unexpected weight change.  Respiratory: Negative for cough, shortness of breath, wheezing and stridor.   Cardiovascular: Negative for chest pain, palpitations and leg swelling.  Gastrointestinal: Negative for abdominal pain, constipation and diarrhea.  Genitourinary: Negative for difficulty urinating and dysuria.  Musculoskeletal: Positive for gait problem. Negative for arthralgias, back pain, joint swelling and myalgias.  Neurological: Negative for dizziness, seizures, syncope, facial asymmetry, speech difficulty, weakness (generally) and headaches.  Hematological: Negative for adenopathy. Does not bruise/bleed easily.  Psychiatric/Behavioral: Positive for agitation, behavioral problems, confusion and dysphoric mood. Negative for decreased concentration, hallucinations, self-injury, sleep disturbance and suicidal ideas. The patient is nervous/anxious. The patient is not hyperactive.     Immunization History  Administered Date(s) Administered  . Influenza, High Dose Seasonal PF 01/04/2017  .  Influenza-Unspecified 12/10/2017, 01/16/2019  . Moderna SARS-COVID-2 Vaccination 04/11/2019, 04/29/2019  . Pneumococcal Conjugate-13 01/30/2014  . Pneumococcal Polysaccharide-23 11/13/2006  . Tdap 11/15/2007, 01/22/2019  . Zoster 11/15/2007   Pertinent  Health Maintenance Due  Topic Date Due  . INFLUENZA VACCINE  10/19/2019  . PNA vac Low Risk Adult  Completed   Fall Risk  05/07/2019 03/18/2019 10/23/2018 05/30/2018  Falls in the past year? 0 1 1 1   Number falls in past yr: 0 0 1 0  Injury with Fall? 0 1 0 -  Risk for fall due to : - Impaired balance/gait;History of fall(s);Impaired vision - -  Follow up - Falls evaluation completed;Falls prevention discussed - -   Functional Status Survey:    Vitals:   12/12/19 1104  Weight: 180 lb 6.4 oz (81.8 kg)   Body mass index is 23.8 kg/m. Physical Exam Vitals and nursing note reviewed.  Constitutional:      General: He is not in acute distress.    Appearance: He is not  diaphoretic.  HENT:     Head: Normocephalic and atraumatic.  Neck:     Thyroid: No thyromegaly.     Vascular: No JVD.     Trachea: No tracheal deviation.  Cardiovascular:     Rate and Rhythm: Normal rate and regular rhythm.     Heart sounds: No murmur heard.   Pulmonary:     Effort: Pulmonary effort is normal. No respiratory distress.     Breath sounds: Normal breath sounds. No wheezing.  Abdominal:     General: Bowel sounds are normal. There is no distension.     Palpations: Abdomen is soft.     Tenderness: There is no abdominal tenderness.  Musculoskeletal:     Right lower leg: No edema.     Left lower leg: No edema.  Lymphadenopathy:     Cervical: No cervical adenopathy.  Skin:    General: Skin is warm and dry.  Neurological:     General: No focal deficit present.     Mental Status: He is alert. Mental status is at baseline.  Psychiatric:        Mood and Affect: Mood normal.     Labs reviewed: Recent Labs    09/05/19 1535 09/11/19 1535  10/13/19 0000  NA 136* 135* 142  K 4.3 4.3 4.0  CL 102 98* 103  CO2 21 25* 24*  BUN 14 15 12   CREATININE 0.8 0.8 0.8  CALCIUM 10.3 10.7 10.2   Recent Labs    07/14/19 0000 07/14/19 1200  AST  --  14  ALT  --  10  ALBUMIN 4.2  --    Recent Labs    07/14/19 1200 09/05/19 1535  WBC 8.4 7.7  HGB 14.4 14.9  HCT 43 45  PLT 217 222   Lab Results  Component Value Date   TSH 3.16 07/14/2019   Lab Results  Component Value Date   HGBA1C 5.5 04/06/2018   Lab Results  Component Value Date   CHOL 160 04/07/2018   HDL 47 04/07/2018   LDLCALC 89 04/07/2018   TRIG 122 04/07/2018   CHOLHDL 3.4 04/07/2018    Significant Diagnostic Results in last 30 days:  No results found.  Assessment/Plan  1. Vascular dementia with behavior disturbance ([place]) Progressing with sundowning.  Continue xanax 0.25 mg tid prn and f/u with Dr. Casimiro Needle  2. Essential hypertension Improved Continue Lopressor 25 mg bid and avapro 300 mg qd   3. 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 Paraesophageal hernia Controlled symptoms of gerd and sob with pepcid at bedtime and protonix 40 mg qd  5. Anxiety state Continue Lexapro 10 mg qd and f/u with Dr. Casimiro Needle   6. Incontinence of feces with fecal urgency Controlled Continue Benefiber 10 mls qd   Family/ staff Communication: discussed with the nurse   Labs/tests ordered:  NA

## 2019-12-16 DIAGNOSIS — R278 Other lack of coordination: Secondary | ICD-10-CM | POA: Diagnosis not present

## 2019-12-16 DIAGNOSIS — R293 Abnormal posture: Secondary | ICD-10-CM | POA: Diagnosis not present

## 2019-12-16 DIAGNOSIS — M2559 Pain in other specified joint: Secondary | ICD-10-CM | POA: Diagnosis not present

## 2019-12-16 DIAGNOSIS — M6389 Disorders of muscle in diseases classified elsewhere, multiple sites: Secondary | ICD-10-CM | POA: Diagnosis not present

## 2019-12-16 DIAGNOSIS — R4189 Other symptoms and signs involving cognitive functions and awareness: Secondary | ICD-10-CM | POA: Diagnosis not present

## 2019-12-16 DIAGNOSIS — R296 Repeated falls: Secondary | ICD-10-CM | POA: Diagnosis not present

## 2020-01-09 DIAGNOSIS — R2689 Other abnormalities of gait and mobility: Secondary | ICD-10-CM | POA: Diagnosis not present

## 2020-01-09 DIAGNOSIS — R296 Repeated falls: Secondary | ICD-10-CM | POA: Diagnosis not present

## 2020-01-09 DIAGNOSIS — R4189 Other symptoms and signs involving cognitive functions and awareness: Secondary | ICD-10-CM | POA: Diagnosis not present

## 2020-01-09 DIAGNOSIS — R278 Other lack of coordination: Secondary | ICD-10-CM | POA: Diagnosis not present

## 2020-01-09 DIAGNOSIS — R293 Abnormal posture: Secondary | ICD-10-CM | POA: Diagnosis not present

## 2020-01-09 DIAGNOSIS — M2559 Pain in other specified joint: Secondary | ICD-10-CM | POA: Diagnosis not present

## 2020-01-09 DIAGNOSIS — M6389 Disorders of muscle in diseases classified elsewhere, multiple sites: Secondary | ICD-10-CM | POA: Diagnosis not present

## 2020-01-09 DIAGNOSIS — Z23 Encounter for immunization: Secondary | ICD-10-CM | POA: Diagnosis not present

## 2020-01-16 DIAGNOSIS — R4189 Other symptoms and signs involving cognitive functions and awareness: Secondary | ICD-10-CM | POA: Diagnosis not present

## 2020-01-16 DIAGNOSIS — M6389 Disorders of muscle in diseases classified elsewhere, multiple sites: Secondary | ICD-10-CM | POA: Diagnosis not present

## 2020-01-16 DIAGNOSIS — R296 Repeated falls: Secondary | ICD-10-CM | POA: Diagnosis not present

## 2020-01-16 DIAGNOSIS — R293 Abnormal posture: Secondary | ICD-10-CM | POA: Diagnosis not present

## 2020-01-16 DIAGNOSIS — R278 Other lack of coordination: Secondary | ICD-10-CM | POA: Diagnosis not present

## 2020-01-16 DIAGNOSIS — M2559 Pain in other specified joint: Secondary | ICD-10-CM | POA: Diagnosis not present

## 2020-01-22 DIAGNOSIS — R2689 Other abnormalities of gait and mobility: Secondary | ICD-10-CM | POA: Diagnosis not present

## 2020-01-22 DIAGNOSIS — M2559 Pain in other specified joint: Secondary | ICD-10-CM | POA: Diagnosis not present

## 2020-01-22 DIAGNOSIS — R278 Other lack of coordination: Secondary | ICD-10-CM | POA: Diagnosis not present

## 2020-01-22 DIAGNOSIS — R4189 Other symptoms and signs involving cognitive functions and awareness: Secondary | ICD-10-CM | POA: Diagnosis not present

## 2020-01-22 DIAGNOSIS — M6389 Disorders of muscle in diseases classified elsewhere, multiple sites: Secondary | ICD-10-CM | POA: Diagnosis not present

## 2020-01-22 DIAGNOSIS — R293 Abnormal posture: Secondary | ICD-10-CM | POA: Diagnosis not present

## 2020-01-22 DIAGNOSIS — R296 Repeated falls: Secondary | ICD-10-CM | POA: Diagnosis not present

## 2020-01-30 ENCOUNTER — Encounter: Payer: Self-pay | Admitting: Adult Health

## 2020-01-30 ENCOUNTER — Non-Acute Institutional Stay (SKILLED_NURSING_FACILITY): Payer: Medicare Other | Admitting: Adult Health

## 2020-01-30 DIAGNOSIS — I69319 Unspecified symptoms and signs involving cognitive functions following cerebral infarction: Secondary | ICD-10-CM | POA: Diagnosis not present

## 2020-01-30 DIAGNOSIS — M545 Low back pain, unspecified: Secondary | ICD-10-CM | POA: Diagnosis not present

## 2020-01-30 DIAGNOSIS — F0150 Vascular dementia without behavioral disturbance: Secondary | ICD-10-CM | POA: Diagnosis not present

## 2020-01-30 DIAGNOSIS — K449 Diaphragmatic hernia without obstruction or gangrene: Secondary | ICD-10-CM | POA: Diagnosis not present

## 2020-01-30 DIAGNOSIS — R269 Unspecified abnormalities of gait and mobility: Secondary | ICD-10-CM | POA: Diagnosis not present

## 2020-01-30 DIAGNOSIS — F411 Generalized anxiety disorder: Secondary | ICD-10-CM

## 2020-01-30 DIAGNOSIS — I10 Essential (primary) hypertension: Secondary | ICD-10-CM | POA: Diagnosis not present

## 2020-01-30 DIAGNOSIS — E785 Hyperlipidemia, unspecified: Secondary | ICD-10-CM

## 2020-01-30 NOTE — Progress Notes (Signed)
Location:  Occupational psychologist of Service:  SNF (31) Provider:   Cindi Carbon, ANP [place] [phone]   Gayland Curry, DO  Patient Care Team: Gayland Curry, DO as PCP - General (Geriatric Medicine)  Extended Emergency Contact Information Primary Emergency Contact: Uehara,[name] Address: [place]          [place] [address] [name] of [place] Phone: [phone] Relation: Spouse Secondary Emergency Contact: [name] Mobile Phone: [phone] Relation: Nephew  Code Status:  Full code  Goals of care: Advanced Directive information Advanced Directives 08/19/2019  Does Patient Have a Medical Advance Directive? Yes  Type of Advance Directive [place]  Does patient want to make changes to medical advance directive? No - Patient declined  Copy of [place] in Chart? Yes - validated most recent copy scanned in chart (See row information)  Would patient like information on creating a medical advance directive? -     Chief Complaint  Patient presents with  . Medical Management of Chronic Issues    HPI:  Pt is a 86 y.o. male seen today for medical management of chronic diseases.    Review of nursing notes indicted [name] is still having periods of agitation. He yells at the staff, makes disruptive strange noises and comments at the nursing station, makes attempts to get oob, and threatens to hit the nursing staff. Recently he put on Effexor and changed from xanax to Ativan. At this time we are not able to tell a difference. He has been eating less as well and has lost weight 8 lbs in 3 months. He is not having any issues with chewing or swallowing. The staff use a hoyer lift or a stand up lift for transfers. He reports his low back is hurting but can't elaborate. No radicular pain noted.  Wt Readings from Last 3 Encounters:  01/30/20 175 lb 3.2 oz (79.5 kg)  12/12/19 180  lb 6.4 oz (81.8 kg)  10/09/19 183 lb 6.4 oz (83.2 kg)     Past Medical History:  Diagnosis Date  . Abnormal gait   . Adenocarcinoma of prostate ([place])   . Anxiety   . Arthritis    hands  . Balance problem   . Benzodiazepine dependence ([place])   . Blood transfusion without reported diagnosis    pt thinks had blood trnsfusion at prostate ca surgery   . Blurred vision   . Carotid atherosclerosis   . Cataract   . Depression, major   . Glaucoma   . Hearing loss   . High cholesterol   . Hypertension   . Knee pain, bilateral   . Lacunar infarction ([place])   . Lumbar radiculopathy   . Ophthalmic herpes zoster   . Prostate cancer ([place])   . Sacroiliitis ([place])   . Vertigo    Past Surgical History:  Procedure Laterality Date  . COLONOSCOPY    . HERNIA REPAIR    . PROSTATECTOMY      Allergies  Allergen Reactions  . Rofecoxib Swelling    REACTION: swelling- Vioxx name brand     Outpatient Encounter Medications as of 01/30/2020  Medication Sig  . acetaminophen (TYLENOL) 325 MG tablet Take 650 mg by mouth daily. And bid prn pain  . atorvastatin (LIPITOR) 20 MG tablet Take 1 tablet (20 mg total) by mouth daily at 6 PM.  . cholestyramine (QUESTRAN) 4 g packet Take 4 g by mouth 2 (  two) times daily.   . clopidogrel (PLAVIX) 75 MG tablet Take 1 tablet (75 mg total) by mouth daily.  . famotidine (PEPCID) 20 MG tablet Take 20 mg by mouth at bedtime.  . hydrOXYzine (ATARAX/VISTARIL) 25 MG tablet Take 25 mg by mouth 4 (four) times daily as needed.  Marland Kitchen ipratropium-albuterol (DUONEB) 0.5-2.5 (3) MG/3ML SOLN Take 3 mLs by nebulization every 6 (six) hours as needed.  . irbesartan (AVAPRO) 300 MG tablet Take 300 mg by mouth daily.  Marland Kitchen latanoprost (XALATAN) 0.005 % ophthalmic solution Place 1 drop into the right eye at bedtime.   . lidocaine (LIDODERM) 5 % Place 1 patch onto the skin daily. Remove & Discard patch within 12 hours or as directed by MD (Patient taking differently: Place 1 patch onto  the skin daily as needed. Remove & Discard patch within 12 hours or as directed by MD)  . LORazepam (ATIVAN) 0.5 MG tablet Take 0.5 mg by mouth 2 (two) times daily.  . metoprolol tartrate (LOPRESSOR) 25 MG tablet Take 25 mg by mouth 2 (two) times daily.   . pantoprazole (PROTONIX) 40 MG tablet Take 40 mg by mouth daily.  . polyethylene glycol (MIRALAX / GLYCOLAX) packet Take 17 g by mouth daily as needed for mild constipation.  . Probiotic Product (ALIGN PO) Take 1 capsule by mouth daily.  Marland Kitchen venlafaxine (EFFEXOR) 75 MG tablet Take 75 mg by mouth daily.  . Wheat Dextrin (BENEFIBER DRINK MIX PO) Take 10 mLs by mouth daily.  . calcium carbonate (TUMS - DOSED IN MG ELEMENTAL CALCIUM) 500 MG chewable tablet Chew 2 tablets by mouth as needed for indigestion.  . [DISCONTINUED] ALPRAZolam (XANAX) 0.25 MG tablet Take 2 tablets (0.5 mg total) by mouth 3 (three) times daily as needed for anxiety.  . [DISCONTINUED] ALPRAZolam (XANAX) 0.5 MG tablet Take 1 tablet (0.5 mg total) by mouth at bedtime.  . [DISCONTINUED] escitalopram (LEXAPRO) 10 MG tablet Take 5 mg by mouth daily. X 1 week then increase to 10 mg qd   No facility-administered encounter medications on file as of 01/30/2020.    Review of Systems  Constitutional: Positive for activity change and unexpected weight change. Negative for appetite change, chills, diaphoresis, fatigue and fever.  Respiratory: Negative for cough, shortness of breath, wheezing and stridor.   Cardiovascular: Negative for chest pain, palpitations and leg swelling.  Gastrointestinal: Negative for abdominal distention, abdominal pain, constipation and diarrhea.  Genitourinary: Negative for difficulty urinating and dysuria.  Musculoskeletal: Positive for back pain and gait problem. Negative for arthralgias, joint swelling and myalgias.  Neurological: Positive for weakness. Negative for dizziness, seizures, syncope, facial asymmetry, speech difficulty and headaches.    Hematological: Negative for adenopathy. Does not bruise/bleed easily.  Psychiatric/Behavioral: Positive for agitation, behavioral problems and confusion.    Immunization History  Administered Date(s) Administered  . Influenza, High Dose Seasonal PF 01/04/2017  . Influenza-Unspecified 12/10/2017, 01/16/2019  . Moderna SARS-COVID-2 Vaccination 04/11/2019, 04/29/2019  . Pneumococcal Conjugate-13 01/30/2014  . Pneumococcal Polysaccharide-23 11/13/2006  . Tdap 11/15/2007, 01/22/2019  . Zoster 11/15/2007   Pertinent  Health Maintenance Due  Topic Date Due  . INFLUENZA VACCINE  10/19/2019  . PNA vac Low Risk Adult  Completed   Fall Risk  05/07/2019 03/18/2019 10/23/2018 05/30/2018  Falls in the past year? 0 1 1 1   Number falls in past yr: 0 0 1 0  Injury with Fall? 0 1 0 -  Risk for fall due to : - Impaired balance/gait;History of fall(s);Impaired vision - -  Follow up - Falls evaluation completed;Falls prevention discussed - -   Functional Status Survey:    Vitals:   01/30/20 1034  Weight: 175 lb 3.2 oz (79.5 kg)   Body mass index is 23.11 kg/m. Physical Exam Vitals and nursing note reviewed.  Constitutional:      General: He is not in acute distress.    Appearance: He is not diaphoretic.  HENT:     Head: Normocephalic and atraumatic.  Neck:     Thyroid: No thyromegaly.     Vascular: No JVD.     Trachea: No tracheal deviation.  Cardiovascular:     Rate and Rhythm: Normal rate and regular rhythm.     Heart sounds: No murmur heard.   Pulmonary:     Effort: Pulmonary effort is normal. No respiratory distress.     Breath sounds: Normal breath sounds. No wheezing.  Abdominal:     General: Bowel sounds are normal. There is no distension.     Palpations: Abdomen is soft.     Tenderness: There is no abdominal tenderness.  Musculoskeletal:        General: No swelling, tenderness, deformity or signs of injury.     Right lower leg: No edema.     Left lower leg: No edema.      Comments: Neg SLR bilaterally.  Lymphadenopathy:     Cervical: No cervical adenopathy.  Skin:    General: Skin is warm and dry.  Neurological:     Mental Status: He is alert.  Psychiatric:        Mood and Affect: Mood normal.     Labs reviewed: Recent Labs    09/05/19 1535 09/11/19 1535 10/13/19 0000  NA 136* 135* 142  K 4.3 4.3 4.0  CL 102 98* 103  CO2 21 25* 24*  BUN 14 15 12   CREATININE 0.8 0.8 0.8  CALCIUM 10.3 10.7 10.2   Recent Labs    07/14/19 0000 07/14/19 1200  AST  --  14  ALT  --  10  ALBUMIN 4.2  --    Recent Labs    07/14/19 1200 09/05/19 1535  WBC 8.4 7.7  HGB 14.4 14.9  HCT 43 45  PLT 217 222   Lab Results  Component Value Date   TSH 3.16 07/14/2019   Lab Results  Component Value Date   HGBA1C 5.5 04/06/2018   Lab Results  Component Value Date   CHOL 160 04/07/2018   HDL 47 04/07/2018   LDLCALC 89 04/07/2018   TRIG 122 04/07/2018   CHOLHDL 3.4 04/07/2018    Significant Diagnostic Results in last 30 days:  No results found.  Assessment/Plan 1. Vascular dementia without behavioral disturbance ([facility]) Moderate and progressing. Followed by Dr. Casimiro Needle. Continues with agitation and aggression. Need more time to see benefit from Effexor/ativan combo. Continue prn vistaril.   2. 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 Essential hypertension Controlled Continue metoprolol 25 mg bid and irbesartan 300 mg qd   4. Paraesophageal hernia Continue Pepcid each evening and protonix each morning   5. Dyslipidemia Lab Results  Component Value Date   LDLCALC 89 04/07/2018   Continue lipitor 20 mg qd   6. Anxiety state Continue Effexor 75 mg qd, followed by Dr. Casimiro Needle  7. Abnormality of gait Due to progressive weakness associated with dementia and prior CVA  8. Low back pain Add tylenol 650 mg qd, if no improvement will schedule salonpas patch  Family/ staff Communication: resident  Labs/tests  ordered:  NA

## 2020-02-04 ENCOUNTER — Other Ambulatory Visit: Payer: Self-pay

## 2020-02-04 MED ORDER — LORAZEPAM 0.5 MG PO TABS: 0.5000 mg | ORAL_TABLET | Freq: Two times a day (BID) | ORAL | 5 refills | Status: DC

## 2020-02-06 DIAGNOSIS — Z79899 Other long term (current) drug therapy: Secondary | ICD-10-CM | POA: Diagnosis not present

## 2020-02-06 LAB — BASIC METABOLIC PANEL
BUN: 14 (ref 4–21)
CO2: 22 (ref 13–22)
Chloride: 106 (ref 99–108)
Creatinine: 0.8 (ref 0.6–1.3)
Glucose: 99
Potassium: 4.1 (ref 3.4–5.3)
Sodium: 142 (ref 137–147)

## 2020-02-06 LAB — COMPREHENSIVE METABOLIC PANEL
Albumin: 4.6 (ref 3.5–5.0)
Calcium: 11 — AB (ref 8.7–10.7)
Globulin: 2.3

## 2020-02-06 LAB — HEPATIC FUNCTION PANEL
ALT: 15 (ref 10–40)
AST: 18 (ref 14–40)

## 2020-02-06 LAB — CBC AND DIFFERENTIAL
HCT: 50 (ref 41–53)
Hemoglobin: 17 (ref 13.5–17.5)
Platelets: 220 (ref 150–399)
WBC: 9.3

## 2020-02-06 LAB — CBC: RBC: 5.91 — AB (ref 3.87–5.11)

## 2020-02-11 ENCOUNTER — Encounter: Payer: Self-pay | Admitting: Internal Medicine

## 2020-02-11 DIAGNOSIS — Z79899 Other long term (current) drug therapy: Secondary | ICD-10-CM | POA: Diagnosis not present

## 2020-02-11 LAB — COMPREHENSIVE METABOLIC PANEL: Calcium: 5.7 — AB (ref 8.7–10.7)

## 2020-02-19 ENCOUNTER — Non-Acute Institutional Stay (SKILLED_NURSING_FACILITY): Payer: Medicare Other | Admitting: Adult Health

## 2020-02-19 ENCOUNTER — Encounter: Payer: Self-pay | Admitting: Internal Medicine

## 2020-02-19 DIAGNOSIS — R5383 Other fatigue: Secondary | ICD-10-CM

## 2020-02-19 NOTE — Progress Notes (Signed)
.  Location:  [facility] of Service:  SNF (31) Provider:   Cindi Duffy, ANP [place] [phone]   [name] Curry, DO  Patient Care Team: [name] Duffy as PCP - General (Geriatric Medicine)  Extended Emergency Contact Information Primary Emergency Contact: Duffy,[name] Address: [place]           [address] [name] [place] Phone: [phone] Relation: Spouse Secondary Emergency Contact: [name] Duffy Mobile Phone: [phone] Relation: Nephew  Code Status:  Full code  Goals of care: Advanced Directive information Advanced Directives 08/19/2019  Does Patient Have a Medical Advance Directive? Yes  Type of Advance Directive [place]  Does patient want to make changes to medical advance directive? No - Patient declined  Copy of [place] in Chart? Yes - validated most recent copy scanned in chart (See row information)  Would patient like information on creating a medical advance directive? -     Chief Complaint  Patient presents with   Acute Visit    hypercalcemia    HPI:  Pt is a 86 y.o. male seen today for an acute visit for hypercalcemia. [name] Duffy had routine lab work which showed an elevated Ca of 11 on 11/19.  PTH 83.76 and Ionized Ca 5.7 on 02/11/20.  Pt has not had any symptoms. For my visit he is asleep in the [place]. His wife is with him and feels he is sleeping too much. He has advancing vascular dementia with behaviors and recently was placed on Remeron 30  Mg and Ativan 0.5 mg bid due to aggressive behaviors.    Past Medical History:  Diagnosis Date   Abnormal gait    Adenocarcinoma of prostate ([place])    Anxiety    Arthritis    hands   Balance problem    Benzodiazepine dependence ([place])    Blood transfusion without reported diagnosis    pt thinks had blood trnsfusion at prostate ca surgery    Blurred vision    Carotid  atherosclerosis    Cataract    Depression, major    Glaucoma    Hearing loss    High cholesterol    Hypertension    Knee pain, bilateral    Lacunar infarction (HCC)    Lumbar radiculopathy    Ophthalmic herpes zoster    Prostate cancer (HCC)    Sacroiliitis (HCC)    Vertigo    Past Surgical History:  Procedure Laterality Date   COLONOSCOPY     HERNIA REPAIR     PROSTATECTOMY      Allergies  Allergen Reactions   Rofecoxib Swelling    REACTION: swelling- Vioxx name brand     Outpatient Encounter Medications as of 02/19/2020  Medication Sig   calcitRIOL (ROCALTROL) 0.25 MCG capsule Take 0.25 mcg by mouth daily.   calcium carbonate (TUMS - DOSED IN MG ELEMENTAL CALCIUM) 500 MG chewable tablet Chew 2 tablets by mouth as needed for indigestion.   cholestyramine (QUESTRAN) 4 g packet Take 4 g by mouth 2 (two) times daily.    clopidogrel (PLAVIX) 75 MG tablet Take 1 tablet (75 mg total) by mouth daily.   famotidine (PEPCID) 20 MG tablet Take 20 mg by mouth at bedtime.   ipratropium-albuterol (DUONEB) 0.5-2.5 (3) MG/3ML SOLN Take 3 mLs by nebulization every 6 (six) hours as needed.   irbesartan (AVAPRO) 300 MG tablet Take 300 mg by mouth daily.   latanoprost (  XALATAN) 0.005 % ophthalmic solution Place 1 drop into the right eye at bedtime.    lidocaine (LIDODERM) 5 % Place 1 patch onto the skin daily. Remove & Discard patch within 12 hours or as directed by MD (Patient taking differently: Place 1 patch onto the skin daily as needed. Remove & Discard patch within 12 hours or as directed by MD)   LORazepam (ATIVAN) 0.5 MG tablet Take 1 tablet (0.5 mg total) by mouth 2 (two) times daily.   metoprolol tartrate (LOPRESSOR) 25 MG tablet Take 25 mg by mouth 2 (two) times daily.    mirtazapine (REMERON) 30 MG tablet Take 30 mg by mouth at bedtime.   pantoprazole (PROTONIX) 40 MG tablet Take 40 mg by mouth daily.   polyethylene glycol (MIRALAX / GLYCOLAX) packet  Take 17 g by mouth daily as needed for mild constipation.   acetaminophen (TYLENOL) 325 MG tablet Take 650 mg by mouth daily. And bid prn pain   atorvastatin (LIPITOR) 20 MG tablet Take 1 tablet (20 mg total) by mouth daily at 6 PM.   Probiotic Product (ALIGN PO) Take 1 capsule by mouth daily.   venlafaxine (EFFEXOR) 75 MG tablet Take 75 mg by mouth daily.   Wheat Dextrin (BENEFIBER DRINK MIX PO) Take 10 mLs by mouth daily.   [DISCONTINUED] hydrOXYzine (ATARAX/VISTARIL) 25 MG tablet Take 25 mg by mouth 4 (four) times daily as needed.   No facility-administered encounter medications on file as of 02/19/2020.    Review of Systems  Constitutional: Positive for activity change and fatigue. Negative for appetite change, chills, diaphoresis, fever and unexpected weight change.  HENT: Negative for congestion.   Respiratory: Negative for cough, shortness of breath, wheezing and stridor.   Cardiovascular: Negative for chest pain, palpitations and leg swelling.  Gastrointestinal: Negative for abdominal distention, abdominal pain, constipation and diarrhea.  Genitourinary: Negative for difficulty urinating and dysuria.  Musculoskeletal: Positive for gait problem. Negative for arthralgias, back pain, joint swelling and myalgias.  Neurological: Negative for dizziness, seizures, syncope, facial asymmetry, speech difficulty, weakness and headaches.  Hematological: Negative for adenopathy. Does not bruise/bleed easily.  Psychiatric/Behavioral: Positive for agitation, behavioral problems and confusion.    Immunization History  Administered Date(s) Administered   Influenza, High Dose Seasonal PF 01/04/2017   Influenza-Unspecified 12/10/2017, 01/16/2019   Moderna SARS-COVID-2 Vaccination 04/11/2019, 04/29/2019   Pneumococcal Conjugate-13 01/30/2014   Pneumococcal Polysaccharide-23 11/13/2006   Tdap 11/15/2007, 01/22/2019   Zoster 11/15/2007   Pertinent  Health Maintenance Due  Topic Date  Due   INFLUENZA VACCINE  10/19/2019   PNA vac Low Risk Adult  Completed   Fall Risk  05/07/2019 03/18/2019 10/23/2018 05/30/2018  Falls in the past year? 0 1 1 1   Number falls in past yr: 0 0 1 0  Injury with Fall? 0 1 0 -  Risk for fall due to : - Impaired balance/gait;History of fall(s);Impaired vision - -  Follow up - Falls evaluation completed;Falls prevention discussed - -   Functional Status Survey:    Vitals:   02/20/20 1555  BP: 108/67  Pulse: 68  Resp: 16  Temp: (!) 97.2 F (36.2 C)  SpO2: 98%   There is no height or weight on file to calculate BMI. Physical Exam Vitals and nursing note reviewed.  Constitutional:      General: He is not in acute distress.    Appearance: He is not diaphoretic.     Comments: Sleepy but arouses  HENT:     Head: Normocephalic  and atraumatic.  Neck:     Thyroid: No thyromegaly.     Vascular: No JVD.     Trachea: No tracheal deviation.  Cardiovascular:     Rate and Rhythm: Normal rate and regular rhythm.     Heart sounds: No murmur heard.   Pulmonary:     Effort: Pulmonary effort is normal. No respiratory distress.     Breath sounds: Normal breath sounds. No wheezing.  Abdominal:     General: Bowel sounds are normal. There is no distension.     Palpations: Abdomen is soft.     Tenderness: There is no abdominal tenderness.  Musculoskeletal:     Cervical back: Normal range of motion and neck supple.     Right lower leg: No edema.     Left lower leg: No edema.  Lymphadenopathy:     Cervical: No cervical adenopathy.  Skin:    General: Skin is warm and dry.  Neurological:     Comments: Oriented to self and place. Not time or situation. Sleepy. Able to f/c.    Psychiatric:        Mood and Affect: Mood normal.     Labs reviewed: Recent Labs    09/11/19 1535 10/13/19 0000 02/06/20 0000  NA 135* 142 142  K 4.3 4.0 4.1  CL 98* 103 106  CO2 25* 24* 22  BUN 15 12 14   CREATININE 0.8 0.8 0.8  CALCIUM 10.7 10.2 11.0*    Recent Labs    07/14/19 0000 07/14/19 1200 02/06/20 0000  AST  --  14 18  ALT  --  10 15  ALBUMIN 4.2  --  4.6   Recent Labs    07/14/19 1200 09/05/19 1535 02/06/20 0000  WBC 8.4 7.7 9.3  HGB 14.4 14.9 17.0  HCT 43 45 50  PLT 217 222 220   Lab Results  Component Value Date   TSH 3.16 07/14/2019   Lab Results  Component Value Date   HGBA1C 5.5 04/06/2018   Lab Results  Component Value Date   CHOL 160 04/07/2018   HDL 47 04/07/2018   LDLCALC 89 04/07/2018   TRIG 122 04/07/2018   CHOLHDL 3.4 04/07/2018    Significant Diagnostic Results in last 30 days:  No results found.  Assessment/Plan 1. Hypercalcemia Unclear etiology, possibly hyperparathyroidism Discussed with Dr. Mariea Clonts and given his progressive dementia we would like to try to avoid aggressive work up and referrals out of the facility (he has behaviors that may be difficult for him to go out for an apt) Will try Begin Calcitriol 0.25 mg qd recheck BMP 4 weeks  Also check Vit D level in the am  2. Lethargy Likely due to new medications. May take time to adjust or may need tapering. Will have DON Collie Siad f/u with Dr. Casimiro Needle (prescribing physician)   Family/ staff Communication: discussed with his wife Ms. Saban  Labs/tests ordered:  Check Vit D level in am

## 2020-02-20 ENCOUNTER — Encounter: Payer: Self-pay | Admitting: Adult Health

## 2020-02-20 DIAGNOSIS — E211 Secondary hyperparathyroidism, not elsewhere classified: Secondary | ICD-10-CM | POA: Diagnosis not present

## 2020-02-20 LAB — VITAMIN D 25 HYDROXY (VIT D DEFICIENCY, FRACTURES): Vit D, 25-Hydroxy: 7.4

## 2020-02-24 DIAGNOSIS — M79671 Pain in right foot: Secondary | ICD-10-CM | POA: Diagnosis not present

## 2020-02-24 DIAGNOSIS — M79672 Pain in left foot: Secondary | ICD-10-CM | POA: Diagnosis not present

## 2020-02-24 DIAGNOSIS — B351 Tinea unguium: Secondary | ICD-10-CM | POA: Diagnosis not present

## 2020-03-18 DIAGNOSIS — I10 Essential (primary) hypertension: Secondary | ICD-10-CM | POA: Diagnosis not present

## 2020-03-18 LAB — BASIC METABOLIC PANEL
BUN: 11 (ref 4–21)
CO2: 25 — AB (ref 13–22)
Chloride: 100 (ref 99–108)
Creatinine: 0.7 (ref 0.6–1.3)
Glucose: 97
Potassium: 4.6 (ref 3.4–5.3)
Sodium: 138 (ref 137–147)

## 2020-03-18 LAB — COMPREHENSIVE METABOLIC PANEL: Calcium: 9.1 (ref 8.7–10.7)

## 2020-04-08 ENCOUNTER — Non-Acute Institutional Stay (SKILLED_NURSING_FACILITY): Payer: Medicare Other | Admitting: Adult Health

## 2020-04-08 ENCOUNTER — Encounter: Payer: Self-pay | Admitting: Adult Health

## 2020-04-08 DIAGNOSIS — F411 Generalized anxiety disorder: Secondary | ICD-10-CM | POA: Diagnosis not present

## 2020-04-08 DIAGNOSIS — K449 Diaphragmatic hernia without obstruction or gangrene: Secondary | ICD-10-CM

## 2020-04-08 DIAGNOSIS — I10 Essential (primary) hypertension: Secondary | ICD-10-CM

## 2020-04-08 DIAGNOSIS — F0150 Vascular dementia without behavioral disturbance: Secondary | ICD-10-CM | POA: Diagnosis not present

## 2020-04-08 NOTE — Progress Notes (Addendum)
Location:  [facility] of Service:  SNF (31) Provider:   Cindi Carbon, ANP [facility] [phone]   Gayland Curry, DO  Patient Care Team: Gayland Curry, DO as PCP - General (Geriatric Medicine)  Extended Emergency Contact Information Primary Emergency Contact: Will,[name] Address: [place]          [place] [address] [name] of [place] Phone: [phone] Relation: Spouse Secondary Emergency Contact: [name] Mobile Phone: [phone] Relation: Nephew  Code Status:  Full code (addendum 05/06/20) Goals of care: Advanced Directive information Advanced Directives 08/19/2019  Does Patient Have a Medical Advance Directive? Yes  Type of Advance Directive [place]  Does patient want to make changes to medical advance directive? No - Patient declined  Copy of [place] in Chart? Yes - validated most recent copy scanned in chart (See row information)  Would patient like information on creating a medical advance directive? -     Chief Complaint  Patient presents with  . Medical Management of Chronic Issues    HPI:  Pt is a 87 y.o. male seen today for medical management of chronic diseases.    Hypercalcemia: Started on calcitriol and vit d on 02/19/20  Intact PTH 02/11/20 83.7 Ionized ca 5.7 02/11/20 Vit D 7.4 02/20/20 Ca 9.1 03/18/20 Does have noted bilateral renal calculi that are non obstructing on renal ultrasound Aug of 2021.  Has not current urinary symptoms  Vascular dementia with prior hx of CVA: he has periods of agitation but this has over all improved.  He is followed by Dr. Casimiro Needle for this reason. He is dependent on staff for all ADLs and is a hoyer lift for transfers.  He is using prn vistaril for agitation including yelling at night. This seems to help.   Past Medical History:  Diagnosis Date  . Abnormal gait   . Adenocarcinoma of prostate ([place])    . Anxiety   . Arthritis    hands  . Balance problem   . Benzodiazepine dependence ([place])   . Blood transfusion without reported diagnosis    pt thinks had blood trnsfusion at prostate ca surgery   . Blurred vision   . Carotid atherosclerosis   . Cataract   . Depression, major   . Glaucoma   . Hearing loss   . High cholesterol   . Hypertension   . Knee pain, bilateral   . Lacunar infarction ([facility])   . Lumbar radiculopathy   . Ophthalmic herpes zoster   . Prostate cancer ([facility])   . Sacroiliitis ([facility] [facility])   . Vertigo    Past Surgical History:  Procedure Laterality Date  . COLONOSCOPY    . HERNIA REPAIR    . PROSTATECTOMY      Allergies  Allergen Reactions  . Rofecoxib Swelling    REACTION: swelling- Vioxx name brand     Outpatient Encounter Medications as of 04/08/2020  Medication Sig  . calcitRIOL (ROCALTROL) 0.25 MCG capsule Take 0.25 mcg by mouth daily.  . Cholecalciferol 25 MCG (1000 UT) tablet Take 2,000 Units by mouth daily.  . cholestyramine (QUESTRAN) 4 g packet Take 4 g by mouth 2 (two) times daily.   . famotidine (PEPCID) 20 MG tablet Take 20 mg by mouth at bedtime.  . hydrOXYzine (ATARAX/VISTARIL) 25 MG tablet Take 25 mg by mouth 4 (four) times daily as needed.  . irbesartan (AVAPRO) 300 MG tablet Take 300 mg by mouth daily.  Marland Kitchen  latanoprost (XALATAN) 0.005 % ophthalmic solution Place 1 drop into the right eye at bedtime.   . metoprolol tartrate (LOPRESSOR) 25 MG tablet Take 25 mg by mouth 2 (two) times daily.   . mirtazapine (REMERON) 30 MG tablet Take 30 mg by mouth at bedtime.  . ondansetron (ZOFRAN) 4 MG tablet Take 4 mg by mouth every 8 (eight) hours as needed for nausea or vomiting.  . pantoprazole (PROTONIX) 40 MG tablet Take 40 mg by mouth daily.  . polyethylene glycol (MIRALAX / GLYCOLAX) packet Take 17 g by mouth daily as needed for mild constipation.  . Probiotic Product (ALIGN PO) Take 1 capsule by mouth daily.  Marland Kitchen venlafaxine (EFFEXOR) 75 MG tablet Take  75 mg by mouth daily.  Marland Kitchen acetaminophen (TYLENOL) 325 MG tablet Take 650 mg by mouth daily. And bid prn pain  . atorvastatin (LIPITOR) 20 MG tablet Take 1 tablet (20 mg total) by mouth daily at 6 PM.  . clopidogrel (PLAVIX) 75 MG tablet Take 1 tablet (75 mg total) by mouth daily.  Marland Kitchen ipratropium-albuterol (DUONEB) 0.5-2.5 (3) MG/3ML SOLN Take 3 mLs by nebulization every 6 (six) hours as needed.  . lidocaine (LIDODERM) 5 % Place 1 patch onto the skin daily. Remove & Discard patch within 12 hours or as directed by MD (Patient taking differently: Place 1 patch onto the skin daily as needed. Remove & Discard patch within 12 hours or as directed by MD)  . LORazepam (ATIVAN) 0.5 MG tablet Take 1 tablet (0.5 mg total) by mouth 2 (two) times daily.  . Wheat Dextrin (BENEFIBER DRINK MIX PO) Take 10 mLs by mouth daily.  . [DISCONTINUED] calcium carbonate (TUMS - DOSED IN MG ELEMENTAL CALCIUM) 500 MG chewable tablet Chew 2 tablets by mouth as needed for indigestion.   No facility-administered encounter medications on file as of 04/08/2020.    Review of Systems  Unable to perform ROS: Dementia    Immunization History  Administered Date(s) Administered  . Influenza, High Dose Seasonal PF 01/04/2017  . Influenza-Unspecified 12/10/2017, 01/16/2019, 01/09/2020  . Moderna Sars-Covid-2 Vaccination 04/11/2019, 04/29/2019  . Pneumococcal Conjugate-13 01/30/2014  . Pneumococcal Polysaccharide-23 11/13/2006  . Tdap 11/15/2007, 01/22/2019  . Zoster 11/15/2007   Pertinent  Health Maintenance Due  Topic Date Due  . INFLUENZA VACCINE  Completed  . PNA vac Low Risk Adult  Completed   Fall Risk  05/07/2019 03/18/2019 10/23/2018 05/30/2018  Falls in the past year? 0 1 1 1   Number falls in past yr: 0 0 1 0  Injury with Fall? 0 1 0 -  Risk for fall due to : - Impaired balance/gait;History of fall(s);Impaired vision - -  Follow up - Falls evaluation completed;Falls prevention discussed - -   Functional Status  Survey:    Vitals:   04/08/20 1633  Weight: 172 lb 12.8 oz (78.4 kg)   Body mass index is 22.8 kg/m. Physical Exam Vitals and nursing note reviewed.  Constitutional:      General: He is not in acute distress.    Appearance: He is not diaphoretic.  HENT:     Head: Normocephalic and atraumatic.  Neck:     Thyroid: No thyromegaly.     Vascular: No JVD.     Trachea: No tracheal deviation.  Cardiovascular:     Rate and Rhythm: Normal rate and regular rhythm.     Heart sounds: No murmur heard.   Pulmonary:     Effort: Pulmonary effort is normal. No respiratory distress.  Breath sounds: Normal breath sounds. No wheezing.  Abdominal:     General: Bowel sounds are normal. There is no distension.     Palpations: Abdomen is soft.     Tenderness: There is no abdominal tenderness.  Musculoskeletal:     Right lower leg: No edema.     Left lower leg: No edema.  Lymphadenopathy:     Cervical: No cervical adenopathy.  Skin:    General: Skin is warm and dry.  Neurological:     Mental Status: He is alert.     Comments: Oriented x 2  Psychiatric:        Mood and Affect: Mood and affect and mood normal.     Labs reviewed: Recent Labs    10/13/19 0000 02/06/20 0000 02/11/20 0000 03/18/20 0000  NA 142 142  --  138  K 4.0 4.1  --  4.6  CL 103 106  --  100  CO2 24* 22  --  25*  BUN 12 14  --  11  CREATININE 0.8 0.8  --  0.7  CALCIUM 10.2 11.0* 5.7* 9.1   Recent Labs    07/14/19 0000 07/14/19 1200 02/06/20 0000  AST  --  14 18  ALT  --  10 15  ALBUMIN 4.2  --  4.6   Recent Labs    07/14/19 1200 09/05/19 1535 02/06/20 0000  WBC 8.4 7.7 9.3  HGB 14.4 14.9 17.0  HCT 43 45 50  PLT 217 222 220   Lab Results  Component Value Date   TSH 3.16 07/14/2019   Lab Results  Component Value Date   HGBA1C 5.5 04/06/2018   Lab Results  Component Value Date   CHOL 160 04/07/2018   HDL 47 04/07/2018   LDLCALC 89 04/07/2018   TRIG 122 04/07/2018   CHOLHDL 3.4  04/07/2018    Significant Diagnostic Results in last 30 days:  No results found.  Assessment/Plan 1. Hypercalcemia Likely due to hyperparathyroidism  Improved Continue Calcitriol Recheck labs in 1 month  2. Vascular dementia without behavioral disturbance ([place]) With prior hx of CVA.  Continues on plavix and statin therapy.  Has progressed in the past 6 months.  Behaviors are improved on current therapy   3. Paraesophageal hernia No current symptoms Continue protonix and pepcid  4. Essential hypertension Need more readings in matrix. Continue avapro 300 mg qd   5. Generalized anxiety disorder Improved  Continue Remeron 30 mg qd, Effexor 75 mg qd, and Ativan 0.5 mg bid    Labs/tests ordered:   BMP Ca in 1 mo

## 2020-05-03 ENCOUNTER — Other Ambulatory Visit: Payer: Self-pay | Admitting: Adult Health

## 2020-05-03 MED ORDER — LORAZEPAM 0.5 MG PO TABS: 0.5000 mg | ORAL_TABLET | Freq: Two times a day (BID) | ORAL | 5 refills | Status: DC

## 2020-05-05 NOTE — Telephone Encounter (Signed)
[name] Duffy, CMA  You 7 minutes ago (4:27 PM)   O.K. Message given to Butch Penny and she stated she will give to [place] to call and schedule the care meeting with the designated parties .    Message text

## 2020-05-06 ENCOUNTER — Other Ambulatory Visit: Payer: Self-pay

## 2020-05-06 ENCOUNTER — Emergency Department (HOSPITAL_COMMUNITY): Payer: Medicare Other

## 2020-05-06 ENCOUNTER — Inpatient Hospital Stay (HOSPITAL_COMMUNITY)
Admission: EM | Admit: 2020-05-06 | Discharge: 2020-05-11 | DRG: 871 | Disposition: A | Discharge status: Skilled Nursing Facility | Payer: Medicare Other | Source: Skilled Nursing Facility | Attending: Family Medicine | Admitting: Family Medicine

## 2020-05-06 ENCOUNTER — Encounter (HOSPITAL_COMMUNITY): Payer: Self-pay

## 2020-05-06 DIAGNOSIS — R140 Abdominal distension (gaseous): Secondary | ICD-10-CM

## 2020-05-06 DIAGNOSIS — I10 Essential (primary) hypertension: Secondary | ICD-10-CM | POA: Diagnosis not present

## 2020-05-06 DIAGNOSIS — F0150 Vascular dementia without behavioral disturbance: Secondary | ICD-10-CM | POA: Diagnosis present

## 2020-05-06 DIAGNOSIS — J90 Pleural effusion, not elsewhere classified: Secondary | ICD-10-CM | POA: Diagnosis not present

## 2020-05-06 DIAGNOSIS — R0902 Hypoxemia: Secondary | ICD-10-CM | POA: Diagnosis present

## 2020-05-06 DIAGNOSIS — H9190 Unspecified hearing loss, unspecified ear: Secondary | ICD-10-CM | POA: Diagnosis present

## 2020-05-06 DIAGNOSIS — I714 Abdominal aortic aneurysm, without rupture: Secondary | ICD-10-CM | POA: Diagnosis not present

## 2020-05-06 DIAGNOSIS — F419 Anxiety disorder, unspecified: Secondary | ICD-10-CM | POA: Diagnosis present

## 2020-05-06 DIAGNOSIS — Z66 Do not resuscitate: Secondary | ICD-10-CM | POA: Diagnosis present

## 2020-05-06 DIAGNOSIS — Z8673 Personal history of transient ischemic attack (TIA), and cerebral infarction without residual deficits: Secondary | ICD-10-CM | POA: Diagnosis not present

## 2020-05-06 DIAGNOSIS — Z8546 Personal history of malignant neoplasm of prostate: Secondary | ICD-10-CM

## 2020-05-06 DIAGNOSIS — A419 Sepsis, unspecified organism: Secondary | ICD-10-CM | POA: Diagnosis not present

## 2020-05-06 DIAGNOSIS — J9601 Acute respiratory failure with hypoxia: Secondary | ICD-10-CM | POA: Diagnosis present

## 2020-05-06 DIAGNOSIS — R279 Unspecified lack of coordination: Secondary | ICD-10-CM | POA: Diagnosis not present

## 2020-05-06 DIAGNOSIS — N200 Calculus of kidney: Secondary | ICD-10-CM | POA: Diagnosis present

## 2020-05-06 DIAGNOSIS — G9340 Encephalopathy, unspecified: Secondary | ICD-10-CM | POA: Diagnosis present

## 2020-05-06 DIAGNOSIS — M5416 Radiculopathy, lumbar region: Secondary | ICD-10-CM | POA: Diagnosis present

## 2020-05-06 DIAGNOSIS — Z7902 Long term (current) use of antithrombotics/antiplatelets: Secondary | ICD-10-CM | POA: Diagnosis not present

## 2020-05-06 DIAGNOSIS — F0151 Vascular dementia with behavioral disturbance: Secondary | ICD-10-CM | POA: Diagnosis present

## 2020-05-06 DIAGNOSIS — Z20822 Contact with and (suspected) exposure to covid-19: Secondary | ICD-10-CM | POA: Diagnosis present

## 2020-05-06 DIAGNOSIS — Z79899 Other long term (current) drug therapy: Secondary | ICD-10-CM

## 2020-05-06 DIAGNOSIS — K5900 Constipation, unspecified: Secondary | ICD-10-CM | POA: Diagnosis present

## 2020-05-06 DIAGNOSIS — J189 Pneumonia, unspecified organism: Secondary | ICD-10-CM | POA: Diagnosis present

## 2020-05-06 DIAGNOSIS — Z888 Allergy status to other drugs, medicaments and biological substances status: Secondary | ICD-10-CM

## 2020-05-06 DIAGNOSIS — R6520 Severe sepsis without septic shock: Secondary | ICD-10-CM | POA: Diagnosis present

## 2020-05-06 DIAGNOSIS — F329 Major depressive disorder, single episode, unspecified: Secondary | ICD-10-CM | POA: Diagnosis present

## 2020-05-06 DIAGNOSIS — J9811 Atelectasis: Secondary | ICD-10-CM | POA: Diagnosis not present

## 2020-05-06 DIAGNOSIS — Z743 Need for continuous supervision: Secondary | ICD-10-CM | POA: Diagnosis not present

## 2020-05-06 DIAGNOSIS — R404 Transient alteration of awareness: Secondary | ICD-10-CM | POA: Diagnosis not present

## 2020-05-06 DIAGNOSIS — E785 Hyperlipidemia, unspecified: Secondary | ICD-10-CM | POA: Diagnosis present

## 2020-05-06 DIAGNOSIS — E7800 Pure hypercholesterolemia, unspecified: Secondary | ICD-10-CM | POA: Diagnosis present

## 2020-05-06 DIAGNOSIS — K5730 Diverticulosis of large intestine without perforation or abscess without bleeding: Secondary | ICD-10-CM | POA: Diagnosis not present

## 2020-05-06 DIAGNOSIS — R0603 Acute respiratory distress: Secondary | ICD-10-CM | POA: Diagnosis not present

## 2020-05-06 DIAGNOSIS — R0781 Pleurodynia: Secondary | ICD-10-CM

## 2020-05-06 DIAGNOSIS — Z8249 Family history of ischemic heart disease and other diseases of the circulatory system: Secondary | ICD-10-CM

## 2020-05-06 DIAGNOSIS — E876 Hypokalemia: Secondary | ICD-10-CM | POA: Diagnosis present

## 2020-05-06 DIAGNOSIS — K449 Diaphragmatic hernia without obstruction or gangrene: Secondary | ICD-10-CM | POA: Diagnosis not present

## 2020-05-06 DIAGNOSIS — R410 Disorientation, unspecified: Secondary | ICD-10-CM | POA: Diagnosis not present

## 2020-05-06 DIAGNOSIS — R0602 Shortness of breath: Secondary | ICD-10-CM | POA: Diagnosis not present

## 2020-05-06 DIAGNOSIS — R069 Unspecified abnormalities of breathing: Secondary | ICD-10-CM | POA: Diagnosis not present

## 2020-05-06 DIAGNOSIS — H409 Unspecified glaucoma: Secondary | ICD-10-CM | POA: Diagnosis present

## 2020-05-06 DIAGNOSIS — B9562 Methicillin resistant Staphylococcus aureus infection as the cause of diseases classified elsewhere: Secondary | ICD-10-CM | POA: Diagnosis present

## 2020-05-06 HISTORY — DX: Do not resuscitate: Z66

## 2020-05-06 LAB — CBC WITH DIFFERENTIAL/PLATELET
Abs Immature Granulocytes: 0.19 10*3/uL — ABNORMAL HIGH (ref 0.00–0.07)
Basophils Absolute: 0.1 10*3/uL (ref 0.0–0.1)
Basophils Relative: 1 %
Eosinophils Absolute: 1 10*3/uL — ABNORMAL HIGH (ref 0.0–0.5)
Eosinophils Relative: 5 %
HCT: 49.1 % (ref 39.0–52.0)
Hemoglobin: 15.8 g/dL (ref 13.0–17.0)
Immature Granulocytes: 1 %
Lymphocytes Relative: 10 %
Lymphs Abs: 1.9 10*3/uL (ref 0.7–4.0)
MCH: 28.6 pg (ref 26.0–34.0)
MCHC: 32.2 g/dL (ref 30.0–36.0)
MCV: 88.9 fL (ref 80.0–100.0)
Monocytes Absolute: 1.5 10*3/uL — ABNORMAL HIGH (ref 0.1–1.0)
Monocytes Relative: 7 %
Neutro Abs: 15.2 10*3/uL — ABNORMAL HIGH (ref 1.7–7.7)
Neutrophils Relative %: 76 %
Platelets: 287 10*3/uL (ref 150–400)
RBC: 5.52 MIL/uL (ref 4.22–5.81)
RDW: 13.1 % (ref 11.5–15.5)
WBC: 19.8 10*3/uL — ABNORMAL HIGH (ref 4.0–10.5)
nRBC: 0 % (ref 0.0–0.2)

## 2020-05-06 LAB — LACTIC ACID, PLASMA
Lactic Acid, Venous: 2.8 mmol/L (ref 0.5–1.9)
Lactic Acid, Venous: 4.8 mmol/L (ref 0.5–1.9)

## 2020-05-06 LAB — HEPATIC FUNCTION PANEL
ALT: 24 U/L (ref 0–44)
AST: 21 U/L (ref 15–41)
Albumin: 3.6 g/dL (ref 3.5–5.0)
Alkaline Phosphatase: 75 U/L (ref 38–126)
Bilirubin, Direct: 0.2 mg/dL (ref 0.0–0.2)
Indirect Bilirubin: 0.7 mg/dL (ref 0.3–0.9)
Total Bilirubin: 0.9 mg/dL (ref 0.3–1.2)
Total Protein: 6.5 g/dL (ref 6.5–8.1)

## 2020-05-06 LAB — PROCALCITONIN: Procalcitonin: <0.1 ng/mL

## 2020-05-06 LAB — BASIC METABOLIC PANEL
Anion gap: 12 (ref 5–15)
BUN: 19 mg/dL (ref 8–23)
CO2: 23 mmol/L (ref 22–32)
Calcium: 9.9 mg/dL (ref 8.9–10.3)
Chloride: 104 mmol/L (ref 98–111)
Creatinine, Ser: 1.19 mg/dL (ref 0.61–1.24)
GFR, Estimated: 59 mL/min — ABNORMAL LOW (ref >60)
Glucose, Bld: 188 mg/dL — ABNORMAL HIGH (ref 70–99)
Potassium: 5.1 mmol/L (ref 3.5–5.1)
Sodium: 139 mmol/L (ref 135–145)

## 2020-05-06 LAB — BRAIN NATRIURETIC PEPTIDE: B Natriuretic Peptide: 426 pg/mL — ABNORMAL HIGH (ref 0.0–100.0)

## 2020-05-06 LAB — RESP PANEL BY RT-PCR (FLU A&B, COVID) ARPGX2
Influenza A by PCR: NEGATIVE
Influenza B by PCR: NEGATIVE
SARS Coronavirus 2 by RT PCR: NEGATIVE

## 2020-05-06 MED ORDER — ACETAMINOPHEN 325 MG PO TABS
650.0000 mg | ORAL_TABLET | Freq: Four times a day (QID) | ORAL | Status: DC | PRN
  Filled 2020-05-06: qty 2

## 2020-05-06 MED ORDER — SODIUM CHLORIDE 0.9% FLUSH
3.0000 mL | Freq: Two times a day (BID) | INTRAVENOUS | Status: DC
  Administered 2020-05-06 – 2020-05-11 (×6): 3 mL via INTRAVENOUS

## 2020-05-06 MED ORDER — POLYETHYLENE GLYCOL 3350 17 G PO PACK: 17.0000 g | PACK | Freq: Every day | ORAL | Status: DC | PRN

## 2020-05-06 MED ORDER — ENOXAPARIN SODIUM 40 MG/0.4ML ~~LOC~~ SOLN
40.0000 mg | SUBCUTANEOUS | Status: DC
  Administered 2020-05-06 – 2020-05-10 (×5): 40 mg via SUBCUTANEOUS
  Filled 2020-05-06 (×5): qty 0.4

## 2020-05-06 MED ORDER — IRBESARTAN 300 MG PO TABS
300.0000 mg | ORAL_TABLET | Freq: Every day | ORAL | Status: DC
  Administered 2020-05-06 – 2020-05-11 (×5): 300 mg via ORAL
  Filled 2020-05-06 (×7): qty 1

## 2020-05-06 MED ORDER — CALCITRIOL 0.25 MCG PO CAPS
0.2500 ug | ORAL_CAPSULE | Freq: Every day | ORAL | Status: DC
  Administered 2020-05-06 – 2020-05-11 (×5): 0.25 ug via ORAL
  Filled 2020-05-06 (×6): qty 1

## 2020-05-06 MED ORDER — LACTATED RINGERS IV SOLN: INTRAVENOUS | Status: DC

## 2020-05-06 MED ORDER — CHOLESTYRAMINE 4 G PO PACK
4.0000 g | PACK | Freq: Two times a day (BID) | ORAL | Status: DC
  Administered 2020-05-07 – 2020-05-11 (×8): 4 g via ORAL
  Filled 2020-05-06 (×11): qty 1

## 2020-05-06 MED ORDER — ONDANSETRON HCL 4 MG PO TABS: 4.0000 mg | ORAL_TABLET | Freq: Four times a day (QID) | ORAL | Status: DC | PRN

## 2020-05-06 MED ORDER — CLOPIDOGREL BISULFATE 75 MG PO TABS
75.0000 mg | ORAL_TABLET | Freq: Every day | ORAL | Status: DC
Start: 2020-05-06 — End: 2020-05-11
  Administered 2020-05-06 – 2020-05-11 (×5): 75 mg via ORAL
  Filled 2020-05-06 (×7): qty 1

## 2020-05-06 MED ORDER — SODIUM CHLORIDE 0.9 % IV SOLN
2.0000 g | Freq: Once | INTRAVENOUS | Status: AC
  Administered 2020-05-06: 2 g via INTRAVENOUS
  Filled 2020-05-06: qty 2

## 2020-05-06 MED ORDER — ATORVASTATIN CALCIUM 10 MG PO TABS
20.0000 mg | ORAL_TABLET | Freq: Every day | ORAL | Status: DC
  Administered 2020-05-06 – 2020-05-08 (×3): 20 mg via ORAL
  Filled 2020-05-06 (×5): qty 2

## 2020-05-06 MED ORDER — METRONIDAZOLE IN NACL 5-0.79 MG/ML-% IV SOLN
500.0000 mg | Freq: Three times a day (TID) | INTRAVENOUS | Status: DC
  Administered 2020-05-06 – 2020-05-08 (×5): 500 mg via INTRAVENOUS
  Filled 2020-05-06 (×6): qty 100

## 2020-05-06 MED ORDER — IPRATROPIUM-ALBUTEROL 0.5-2.5 (3) MG/3ML IN SOLN: 3.0000 mL | Freq: Four times a day (QID) | RESPIRATORY_TRACT | Status: DC | PRN

## 2020-05-06 MED ORDER — VANCOMYCIN HCL 1250 MG/250ML IV SOLN
1250.0000 mg | INTRAVENOUS | Status: AC
  Administered 2020-05-07 – 2020-05-10 (×4): 1250 mg via INTRAVENOUS
  Filled 2020-05-06 (×4): qty 250

## 2020-05-06 MED ORDER — IOHEXOL 300 MG/ML  SOLN
100.0000 mL | Freq: Once | INTRAMUSCULAR | Status: AC | PRN
  Administered 2020-05-06: 100 mL via INTRAVENOUS

## 2020-05-06 MED ORDER — LORAZEPAM 0.5 MG PO TABS
0.5000 mg | ORAL_TABLET | Freq: Two times a day (BID) | ORAL | Status: DC
  Administered 2020-05-06 – 2020-05-11 (×8): 0.5 mg via ORAL
  Filled 2020-05-06 (×9): qty 1

## 2020-05-06 MED ORDER — VANCOMYCIN HCL 1750 MG/350ML IV SOLN
1750.0000 mg | Freq: Once | INTRAVENOUS | Status: AC
  Administered 2020-05-06: 1750 mg via INTRAVENOUS
  Filled 2020-05-06 (×2): qty 350

## 2020-05-06 MED ORDER — ONDANSETRON HCL 4 MG/2ML IJ SOLN: 4.0000 mg | Freq: Four times a day (QID) | INTRAMUSCULAR | Status: DC | PRN

## 2020-05-06 MED ORDER — VANCOMYCIN HCL 1750 MG/350ML IV SOLN
1750.0000 mg | Freq: Once | INTRAVENOUS | Status: DC
  Filled 2020-05-06: qty 350

## 2020-05-06 MED ORDER — PIPERACILLIN-TAZOBACTAM 3.375 G IVPB 30 MIN
3.3750 g | Freq: Once | INTRAVENOUS | Status: AC
  Administered 2020-05-06: 3.375 g via INTRAVENOUS
  Filled 2020-05-06: qty 50

## 2020-05-06 MED ORDER — SODIUM CHLORIDE 0.9 % IV SOLN
2.0000 g | Freq: Two times a day (BID) | INTRAVENOUS | Status: DC
  Administered 2020-05-06 – 2020-05-07 (×3): 2 g via INTRAVENOUS
  Filled 2020-05-06 (×3): qty 2

## 2020-05-06 MED ORDER — VENLAFAXINE HCL 75 MG PO TABS: 75.0000 mg | ORAL_TABLET | Freq: Every day | ORAL | Status: DC

## 2020-05-06 MED ORDER — ACETAMINOPHEN 650 MG RE SUPP: 650.0000 mg | Freq: Four times a day (QID) | RECTAL | Status: DC | PRN

## 2020-05-06 MED ORDER — VANCOMYCIN HCL 1250 MG/250ML IV SOLN: 1250.0000 mg | INTRAVENOUS | Status: DC

## 2020-05-06 MED ORDER — VANCOMYCIN HCL 1750 MG/350ML IV SOLN
1750.0000 mg | Freq: Once | INTRAVENOUS | Status: DC
  Filled 2020-05-06 (×2): qty 350

## 2020-05-06 MED ORDER — METOPROLOL TARTRATE 25 MG PO TABS
25.0000 mg | ORAL_TABLET | Freq: Two times a day (BID) | ORAL | Status: DC
  Administered 2020-05-06 – 2020-05-11 (×8): 25 mg via ORAL
  Filled 2020-05-06 (×10): qty 1

## 2020-05-06 MED ORDER — VENLAFAXINE HCL ER 75 MG PO CP24
75.0000 mg | ORAL_CAPSULE | Freq: Every day | ORAL | Status: DC
  Administered 2020-05-06 – 2020-05-11 (×4): 75 mg via ORAL
  Filled 2020-05-06 (×7): qty 1

## 2020-05-06 MED ORDER — HYDROXYZINE HCL 25 MG PO TABS
25.0000 mg | ORAL_TABLET | Freq: Four times a day (QID) | ORAL | Status: DC | PRN
  Administered 2020-05-06 – 2020-05-10 (×5): 25 mg via ORAL
  Filled 2020-05-06 (×5): qty 1

## 2020-05-06 MED ORDER — MIRTAZAPINE 15 MG PO TABS
30.0000 mg | ORAL_TABLET | Freq: Every day | ORAL | Status: DC
  Administered 2020-05-06 – 2020-05-10 (×4): 30 mg via ORAL
  Filled 2020-05-06: qty 2
  Filled 2020-05-06: qty 1
  Filled 2020-05-06 (×3): qty 2

## 2020-05-06 MED ORDER — BISACODYL 5 MG PO TBEC
5.0000 mg | DELAYED_RELEASE_TABLET | Freq: Once | ORAL | Status: AC
  Administered 2020-05-06: 5 mg via ORAL
  Filled 2020-05-06: qty 1

## 2020-05-06 MED ORDER — LABETALOL HCL 5 MG/ML IV SOLN
10.0000 mg | Freq: Once | INTRAVENOUS | Status: AC
  Administered 2020-05-06: 10 mg via INTRAVENOUS
  Filled 2020-05-06: qty 4

## 2020-05-06 MED ORDER — PANTOPRAZOLE SODIUM 40 MG IV SOLR
40.0000 mg | Freq: Two times a day (BID) | INTRAVENOUS | Status: DC
  Administered 2020-05-06 – 2020-05-11 (×9): 40 mg via INTRAVENOUS
  Filled 2020-05-06 (×10): qty 40

## 2020-05-06 MED ORDER — LATANOPROST 0.005 % OP SOLN
1.0000 [drp] | Freq: Every day | OPHTHALMIC | Status: DC
  Administered 2020-05-06 – 2020-05-10 (×5): 1 [drp] via OPHTHALMIC
  Filled 2020-05-06: qty 2.5

## 2020-05-06 NOTE — ED Notes (Signed)
RN called 10M to give nurse update on patients status

## 2020-05-06 NOTE — ED Provider Notes (Signed)
  Physical Exam  BP (!) 185/124   Pulse 100   Temp 98.8 F (37.1 C) (Oral)   Resp (!) 35   SpO2 95%   Physical Exam  ED Course/Procedures     Procedures  MDM  Patient brought in for shortness of breath.  Shortness of breath hypoxia.  Required nonrebreather but now on nasal cannula.  Chest x-ray showed large hiatal hernia.  CT scan done and showed hiatal hernia and potentially some constipation.  Patient really cannot provide much history.  However with hypoxia will require admission to the hospital.  Is somewhat hypertensive.  I think he likely did not get his medications this morning.  Will discuss with hospitalist       Davonna Belling, MD 05/06/20 [phone]

## 2020-05-06 NOTE — Progress Notes (Signed)
Pharmacy Antibiotic Note  [name] Duffy is a 87 y.o. male admitted on 05/06/2020 with respiratory distress - started on BiPAP.  Pharmacy has been consulted for vancomycin and cefepime dosing for sepsis - source unknown. WBC 19.8. Tmax 100.4. Scr 1.19 with current CrCl ~48 ml/min. Baseline Scr ~0.8.   Plan: Vancomycin 1750 mg x1 loading dose followed by 1250mg  IV q24h (eAUC 490 using Scr 1.19) Cefepime 2g IV q12h  Monitor renal function, cultures, infectious work up, and clinical progression   Height: 6\' 1"  (185.4 cm) Weight: 79.4 kg (175 lb) IBW/kg (Calculated) : 79.9  Temp (24hrs), Avg:98.5 F (36.9 C), Min:96.3 F (35.7 C), Max:100.4 F (38 C)  Recent Labs  Lab 05/06/20 0453  WBC 19.8*  CREATININE 1.19    Estimated Creatinine Clearance: 48.2 mL/min (by C-G formula based on SCr of 1.19 mg/dL).    Allergies  Allergen Reactions  . Rofecoxib Swelling    REACTION: swelling- Vioxx name brand     Antimicrobials this admission: Vancomycin 2/17 >>  Cefepime 2/17 >>  Metronidazole 2/17 >>   Dose adjustments this admission: N/a  Microbiology results: 2/17 bcx:  2/17 ucx:   Thank you for allowing pharmacy to be a part of this patient's care.  Cristela Felt, PharmD Clinical Pharmacist  05/06/2020 10:06 AM

## 2020-05-06 NOTE — ED Notes (Signed)
Attempted to contact RT in regards to removing BiPAP for CT without response. This RN passed message along to oncoming RN, Wickline MD and CT tech made aware.

## 2020-05-06 NOTE — ED Notes (Signed)
RN attempted report 

## 2020-05-06 NOTE — ED Provider Notes (Signed)
[facility] EMERGENCY DEPARTMENT Provider Note   CSN: [id number] Arrival date & time: 05/06/20  1017     History Chief Complaint  Patient presents with  . Respiratory Distress   Level 5 caveat due to acuity of condition [name] Duffy is a 87 y.o. male.  The history is provided by the patient and the nursing home. The history is limited by the condition of the patient.  Shortness of Breath Severity:  Severe Onset quality:  Sudden Timing:  Constant Progression:  Worsening Chronicity:  New Relieved by:  Nothing Worsened by:  Nothing     Patient with history of hypertension, hyperlipidemia presents with abrupt onset of shortness of breath.  Patient is unable provide any history.  I spoke to staff at [facility]. They report that patient had a large bowel movement and was being changed, when he had abrupt onset of shortness of breath.  He became hypoxic and they applied oxygen.  He does not typically require oxygen.  It is reported that he has no history of underlying lung disease. His only other family member is his wife who is at the same facility.  She was informed of his illness Past Medical History:  Diagnosis Date  . Abnormal gait   . Adenocarcinoma of prostate ([place])   . Anxiety   . Arthritis    hands  . Balance problem   . Benzodiazepine dependence ([place])   . Blood transfusion without reported diagnosis    pt thinks had blood trnsfusion at prostate ca surgery   . Blurred vision   . Carotid atherosclerosis   . Cataract   . Depression, major   . Glaucoma   . Hearing loss   . High cholesterol   . Hypertension   . Knee pain, bilateral   . Lacunar infarction ([place])   . Lumbar radiculopathy   . Ophthalmic herpes zoster   . Prostate cancer ([place])   . Sacroiliitis ([place])   . Vertigo     Patient Active Problem List   Diagnosis Date Noted  . Hypercalcemia 04/08/2020  . Dyspnea on exertion 10/09/2019  . 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 Vascular dementia without behavioral disturbance ([place]) 03/10/2019  . Cerebellar dysfunction 03/10/2019  . Incontinence of feces with fecal urgency 03/10/2019  . Functional urinary incontinence 03/10/2019  . Frequent falls 03/10/2019  . Weakness of right lower extremity 07/25/2018  . Abnormality of gait 05/30/2018  . Hypoalbuminemia due to protein-calorie malnutrition ([place])   . Essential hypertension   . Glaucoma   . Dyslipidemia   . Left sided lacunar stroke ([place]) 04/10/2018  . Acute CVA (cerebrovascular accident) ([place]) 04/06/2018  . CVA (cerebral vascular accident) ([place]) 04/06/2018  . 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 Open angle with borderline findings, low risk, unspecified eye 10/12/2014  . Primary open angle glaucoma 10/12/2014  . Anxiety state 07/23/2013  . Genuine stress incontinence, male 12/13/2011  . H/O malignant neoplasm of prostate 01/28/2011    Past Surgical History:  Procedure Laterality Date  . COLONOSCOPY    . HERNIA REPAIR    . PROSTATECTOMY         Family History  Problem Relation Age of Onset  . Hypertension Other   . Colon cancer Neg Hx   . Colon polyps Neg Hx   . Esophageal cancer Neg Hx   . Rectal cancer Neg Hx   . Stomach cancer Neg Hx  Social History   Tobacco Use  . Smoking status: Never Smoker  . Smokeless tobacco: Never Used  Vaping Use  . Vaping Use: Never used  Substance Use Topics  . Alcohol use: No  . Drug use: No    Home Medications Prior to Admission medications   Medication Sig Start Date End Date Taking? Authorizing Provider  acetaminophen (TYLENOL) 325 MG tablet Take 650 mg by mouth daily. And bid prn pain    [provider]  atorvastatin (LIPITOR) 20 MG tablet Take 1 tablet (20 mg total) by mouth daily at 6 PM. 04/10/18   Debbe Odea, MD  calcitRIOL (ROCALTROL) 0.25 MCG capsule Take 0.25 mcg by mouth daily.    [provider]  Cholecalciferol 25 MCG (1000 UT) tablet Take 2,000 Units by mouth daily.    [provider]  cholestyramine (QUESTRAN) 4 g packet Take 4 g by mouth 2 (two) times daily.     [provider]  clopidogrel (PLAVIX) 75 MG tablet Take 1 tablet (75 mg total) by mouth daily. 04/19/18   Love, Ivan Anchors, PA-C  famotidine (PEPCID) 20 MG tablet Take 20 mg by mouth at bedtime.    [provider]  hydrOXYzine (ATARAX/VISTARIL) 25 MG tablet Take 25 mg by mouth 4 (four) times daily as needed.    [provider]  ipratropium-albuterol (DUONEB) 0.5-2.5 (3) MG/3ML SOLN Take 3 mLs by nebulization every 6 (six) hours as needed.    [provider]  irbesartan (AVAPRO) 300 MG tablet Take 300 mg by mouth daily.    [provider]  latanoprost (XALATAN) 0.005 % ophthalmic solution Place 1 drop into the right eye at bedtime.  06/10/14   [provider]  lidocaine (LIDODERM) 5 % Place 1 patch onto the skin daily. Remove & Discard patch within 12 hours or as directed by MD Patient taking differently: Place 1 patch onto the skin daily as needed. Remove & Discard patch within 12 hours or as directed by MD 04/19/18   Love, Ivan Anchors, PA-C  LORazepam (ATIVAN) 0.5 MG tablet Take 1 tablet (0.5 mg total) by mouth 2 (two) times daily. 05/03/20   Royal Hawthorn, NP  metoprolol tartrate (LOPRESSOR) 25 MG tablet Take 25 mg by mouth 2 (two) times daily.     [provider]  mirtazapine (REMERON) 30 MG tablet Take 30 mg by mouth at bedtime.    [provider]  ondansetron (ZOFRAN) 4 MG tablet Take 4 mg by mouth every 8 (eight) hours as needed for nausea or vomiting.    [provider]  pantoprazole (PROTONIX) 40 MG tablet Take 40 mg by mouth daily.    [provider]  polyethylene glycol (MIRALAX / GLYCOLAX) packet Take 17 g by mouth daily as needed for mild constipation. 04/10/18   Debbe Odea, MD  Probiotic Product (ALIGN PO)  Take 1 capsule by mouth daily.    [provider]  venlafaxine (EFFEXOR) 75 MG tablet Take 75 mg by mouth daily.    [provider]  Wheat Dextrin (BENEFIBER DRINK MIX PO) Take 10 mLs by mouth daily.    [provider]    Allergies    Rofecoxib  Review of Systems   Review of Systems  Unable to perform ROS: Acuity of condition  Respiratory: Positive for shortness of breath.     Physical Exam Updated Vital Signs BP (!) 205/133 (BP Location: Right Arm)   Pulse (!) 112   Temp (!) 96.3 F (  35.7 C) (Axillary)   Resp (!) 21   SpO2 100%   Physical Exam CONSTITUTIONAL: Elderly and ill-appearing HEAD: Normocephalic/atraumatic EYES: EOMI/PERRL ENMT: Mucous membranes moist NECK: supple no meningeal signs SPINE/BACK:entire spine nontender CV: S1/S2 noted, tachycardic LUNGS: Tachypnea, decreased breath sounds bilaterally ABDOMEN: soft, nontender NEURO: Pt is awake/alert, moves all extremitiesx4.  EXTREMITIES: pulses normal/equal, full ROM SKIN: warm, color normal PSYCH: Anxious  ED Results / Procedures / Treatments   Labs (all labs ordered are listed, but only abnormal results are displayed) Labs Reviewed  BASIC METABOLIC PANEL - Abnormal; Notable for the following components:      Result Value   Glucose, Bld 188 (*)    GFR, Estimated 59 (*)    All other components within normal limits  CBC WITH DIFFERENTIAL/PLATELET - Abnormal; Notable for the following components:   WBC 19.8 (*)    Neutro Abs 15.2 (*)    Monocytes Absolute 1.5 (*)    Eosinophils Absolute 1.0 (*)    Abs Immature Granulocytes 0.19 (*)    All other components within normal limits  RESP PANEL BY RT-PCR (FLU A&B, COVID) ARPGX2  URINE CULTURE  CULTURE, BLOOD (ROUTINE X 2)  CULTURE, BLOOD (ROUTINE X 2)  BRAIN NATRIURETIC PEPTIDE  LACTIC ACID, PLASMA  LACTIC ACID, PLASMA  URINALYSIS, ROUTINE W REFLEX MICROSCOPIC  HEPATIC FUNCTION PANEL    EKG EKG  Interpretation  Date/Time:  Thursday May 06 2020 04:41:51 EST Ventricular Rate:  113 PR Interval:    QRS Duration: 131 QT Interval:  452 QTC Calculation: 620 R Axis:   -158 Text Interpretation: Sinus or ectopic atrial tachycardia RBBB and LPFB Interpretation limited secondary to artifact Confirmed by Ripley Fraise ([id number]) on 05/06/2020 4:46:21 AM   Radiology DG Chest Port 1 View  Result Date: 05/06/2020 CLINICAL DATA:  Shortness of breath EXAM: PORTABLE CHEST 1 VIEW COMPARISON:  X-ray esophagus 07/22/2019, chest x-ray 04/06/2018 FINDINGS: Redemonstration of a more prominent large hiatal hernia with suggestion of gaseous distension of the stomach. No definite pneumatosis. The heart size and mediastinal contours are difficult to evaluate given large hiatal hernia. Cardiomegaly. Aortic arch calcifications. Bilateral passive atelectasis. No focal consolidation. No pulmonary edema. No pleural effusion. No pneumothorax. No acute osseous abnormality. Multilevel degenerative changes of the spine. IMPRESSION: 1. More prominent large hiatal hernia with suggestion of gaseous distension of the stomach. Recommend CT with intravenous contrast for further evaluation. 2. Limited evaluation of the lung parenchyma given atelectasis. Electronically Signed   By: Iven Finn M.D.   On: 05/06/2020 05:12    Procedures .Critical Care Performed by: Ripley Fraise, MD Authorized by: Ripley Fraise, MD   Critical care provider statement:    Critical care time (minutes):  90   Critical care start time:  05/06/2020 5:30 AM   Critical care end time:  05/06/2020 7:00 AM   Critical care time was exclusive of:  Separately billable procedures and treating other patients   Critical care was necessary to treat or prevent imminent or life-threatening deterioration of the following conditions:  Respiratory failure   Critical care was time spent personally by me on the following activities:  Ordering and review  of radiographic studies, pulse oximetry, ordering and review of laboratory studies, evaluation of patient's response to treatment, examination of patient, re-evaluation of patient's condition and development of treatment plan with patient or surrogate   I assumed direction of critical care for this patient from another provider in my specialty: no       Medications Ordered  in ED Medications  piperacillin-tazobactam (ZOSYN) IVPB 3.375 g (has no administration in time range)    ED Course  I have reviewed the triage vital signs and the nursing notes.  Pertinent labs & imaging results that were available during my care of the patient were reviewed by me and considered in my medical decision making (see chart for details).    MDM Rules/Calculators/A&P                          5:19 AM Patient presents in respiratory distress.  He was tachypneic with very poor air movement.  I have ordered BiPAP. 5:55 AM Discussed chest x-ray findings with radiologist.  She would recommend CT chest abdomen pelvis with IV contrast due to significant findings in the heart hiatal hernia and gaseous stomach inhibit views of the chest.  Patient appears to be more comfortable on BiPAP.  He does not appear to have any significant abdominal tenderness, though he is a very poor historian  however he is found to be febrile.  I have expanded laboratory evaluation.  Will order cultures and lactate. We will continue to follow 7:07 AM Antibiotics ordered for patient Pt resting comfortably and improved on Bipap CT imaging pending Signed out to Dr. Alvino Chapel at shift change Final Clinical Impression(s) / ED Diagnoses Final diagnoses:  None    Rx / DC Orders ED Discharge Orders    None       Ripley Fraise, MD 05/06/20 [phone]

## 2020-05-06 NOTE — ED Notes (Signed)
RN asked previous RN about the 10 oclock meds not given and she stated that she got the pt around 1 pm and it was too late to give the medication. RN asked pharmacist to reschedule the medications and pharmacists mentioned he will dc 10oclock meds and reschedule them.

## 2020-05-06 NOTE — ED Triage Notes (Signed)
EMS arrival from [facility] co resp distress per facility. Per EMS upon arrival facility reports 88% SPO2 RA, placed pt on 15% non rebreather SPO2 97%, currently on 10L. Per EMS R lobe wheezing, L lobe diminished and upper and lower extremity mottling. Pt confused at baseline, R bundle branch block per EKG, 209/140, R44, 20 L AC, 4mg  Zofran in route.

## 2020-05-06 NOTE — H&P (Addendum)
History and Physical    [name] Duffy FTD:[id number] DOB: Jul 25, 1929 DOA: 05/06/2020  PCP: Gayland Curry, DO Consultants:  Amalia Hailey - urology Patient coming from: [facility]; NOK:  Wife, Shadeed Colberg, [phone]; Marylene Land, [phone]   Chief Complaint:  Respiratory distress  HPI: [name] Duffy is a 87 y.o. male with medical history significant of prostate CA; dementia; HTN; and HLD presenting with hypoxia.  He was last seen at his facility on 1/20; he is DNR and has vascular dementia with h/o CVA with periods of agitation.  His paraesophageal hernia was noted to be asymptomatic with ongoing use of Protonix and Pepcid.  His wife knows little about why he was sent - she was told he has SOB and she is "highly agitated."  His nephew and his wife are in agreement with DNR.  He has had fairly quick progression of his vascular dementia and has periodic episodes of inability to communicate.   ED Course:  New O2 requirement, 88% on room air. CXR/CT with large hiatal hernia.  On BIPAP -> [place] O2.  Negative COVID.    Review of Systems: Unable to perform  COVID Vaccine Status:  Complete  Past Medical History:  Diagnosis Date  . Abnormal gait   . Adenocarcinoma of prostate ([place])   . Anxiety   . Arthritis    hands  . Balance problem   . Benzodiazepine dependence ([place])   . Blood transfusion without reported diagnosis    pt thinks had blood trnsfusion at prostate ca surgery   . Blurred vision   . Carotid atherosclerosis   . Cataract   . Depression, major   . DNR (do not resuscitate) 05/06/2020  . Glaucoma   . Hearing loss   . High cholesterol   . Hypertension   . Knee pain, bilateral   . Lacunar infarction ([place])   . Lumbar radiculopathy   . Ophthalmic herpes zoster   . Prostate cancer ([place])   . Sacroiliitis ([place])   . Vertigo     Past Surgical History:  Procedure Laterality Date  . COLONOSCOPY    . HERNIA REPAIR    . PROSTATECTOMY      Social  History   Socioeconomic History  . Marital status: Married    Spouse name: Not on file  . Number of children: Not on file  . Years of education: Not on file  . Highest education level: Not on file  Occupational History  . Not on file  Tobacco Use  . Smoking status: Never Smoker  . Smokeless tobacco: Never Used  Vaping Use  . Vaping Use: Never used  Substance and Sexual Activity  . Alcohol use: No  . Drug use: No  . Sexual activity: Not on file  Other Topics Concern  . Not on file  Social History Narrative  . Not on file   Social Determinants of Health   Financial Resource Strain: Not on file  Food Insecurity: Not on file  Transportation Needs: Not on file  Physical Activity: Not on file  Stress: Not on file  Social Connections: Not on file  Intimate Partner Violence: Not on file    Allergies  Allergen Reactions  . Rofecoxib Swelling    REACTION: swelling- Vioxx name brand     Family History  Problem Relation Age of Onset  . Hypertension Other   . Colon cancer Neg Hx   . Colon polyps Neg Hx   . Esophageal cancer Neg Hx   .  Rectal cancer Neg Hx   . Stomach cancer Neg Hx     Prior to Admission medications   Medication Sig Start Date End Date Taking? Authorizing Provider  acetaminophen (TYLENOL) 325 MG tablet Take 650 mg by mouth daily. And bid prn pain    [provider]  atorvastatin (LIPITOR) 20 MG tablet Take 1 tablet (20 mg total) by mouth daily at 6 PM. 04/10/18   Debbe Odea, MD  calcitRIOL (ROCALTROL) 0.25 MCG capsule Take 0.25 mcg by mouth daily.    [provider]  Cholecalciferol 25 MCG (1000 UT) tablet Take 2,000 Units by mouth daily.    [provider]  cholestyramine (QUESTRAN) 4 g packet Take 4 g by mouth 2 (two) times daily.     [provider]  clopidogrel (PLAVIX) 75 MG tablet Take 1 tablet (75 mg total) by mouth daily. 04/19/18   Love, Ivan Anchors, PA-C  famotidine (PEPCID) 20 MG tablet Take 20 mg by mouth at  bedtime.    [provider]  hydrOXYzine (ATARAX/VISTARIL) 25 MG tablet Take 25 mg by mouth 4 (four) times daily as needed.    [provider]  ipratropium-albuterol (DUONEB) 0.5-2.5 (3) MG/3ML SOLN Take 3 mLs by nebulization every 6 (six) hours as needed.    [provider]  irbesartan (AVAPRO) 300 MG tablet Take 300 mg by mouth daily.    [provider]  latanoprost (XALATAN) 0.005 % ophthalmic solution Place 1 drop into the right eye at bedtime.  06/10/14   [provider]  lidocaine (LIDODERM) 5 % Place 1 patch onto the skin daily. Remove & Discard patch within 12 hours or as directed by MD Patient taking differently: Place 1 patch onto the skin daily as needed. Remove & Discard patch within 12 hours or as directed by MD 04/19/18   Love, Ivan Anchors, PA-C  LORazepam (ATIVAN) 0.5 MG tablet Take 1 tablet (0.5 mg total) by mouth 2 (two) times daily. 05/03/20   Royal Hawthorn, NP  metoprolol tartrate (LOPRESSOR) 25 MG tablet Take 25 mg by mouth 2 (two) times daily.     [provider]  mirtazapine (REMERON) 30 MG tablet Take 30 mg by mouth at bedtime.    [provider]  ondansetron (ZOFRAN) 4 MG tablet Take 4 mg by mouth every 8 (eight) hours as needed for nausea or vomiting.    [provider]  pantoprazole (PROTONIX) 40 MG tablet Take 40 mg by mouth daily.    [provider]  polyethylene glycol (MIRALAX / GLYCOLAX) packet Take 17 g by mouth daily as needed for mild constipation. 04/10/18   Debbe Odea, MD  Probiotic Product (ALIGN PO) Take 1 capsule by mouth daily.    [provider]  venlafaxine (EFFEXOR) 75 MG tablet Take 75 mg by mouth daily.    [provider]  Wheat Dextrin (BENEFIBER DRINK MIX PO) Take 10 mLs by mouth daily.    [provider]    Physical Exam: Vitals:   05/06/20 0800 05/06/20 0858 05/06/20 0915 05/06/20 1000  BP:  (!) 185/124 (!) 180/130   Pulse:  100 (!) 102    Resp:  (!) 35 18   Temp:      TempSrc:      SpO2: 95% 95% 100%   Weight:    79.4 kg  Height:    6\' 1"  (1.854 m)     . General:  Appears calm and comfortable and is in NAD; he is awake  and alert but minimally conversant and does not answer questions . Eyes:  PERRL, EOMI, normal lids, iris . ENT:  Hard of hearing, grossly normal lips & tongue, mmm . Neck:  no LAD, masses or thyromegaly . Cardiovascular:  RR with tachycardia, no m/r/g.  . Respiratory:   CTA bilaterally with no wheezes/rales/rhonchi.  Mildly increased respiratory effort. . Abdomen:  soft, NT, ND, NABS . Skin:  no rash or induration seen on limited exam . Musculoskeletal:  grossly normal tone BUE/BLE, good ROM, no bony abnormality . Lower extremity:  Trace LE edema.  Limited foot exam with no ulcerations.  2+ distal pulses. Marland Kitchen Psychiatric:  Alert, does not really interact, minimal attempt at communication . Neurologic:  Unable to perform    Radiological Exams on Admission: Independently reviewed - see discussion in A/P where applicable  CT CHEST ABDOMEN PELVIS W CONTRAST  Result Date: 05/06/2020 CLINICAL DATA:  Chest pain, shortness of breath EXAM: CT CHEST, ABDOMEN, AND PELVIS WITH CONTRAST TECHNIQUE: Multidetector CT imaging of the chest, abdomen and pelvis was performed following the standard protocol during bolus administration of intravenous contrast. CONTRAST:  118mL OMNIPAQUE IOHEXOL 300 MG/ML  SOLN COMPARISON:  None. FINDINGS: CT CHEST FINDINGS Cardiovascular: Heart is upper limits normal in size. Coronary artery and aortic calcifications. Tortuous aorta. No aneurysm. Mediastinum/Nodes: Large hiatal hernia with most of the stomach as well as portions of the transverse colon noted in the hernia. Esophagus is fluid-filled, likely related to reflux. No mediastinal, hilar, or axillary adenopathy. Thyroid unremarkable. Lungs/Pleura: Bibasilar compressive atelectasis due to the large hernia versus scarring. Trace left  effusion. Musculoskeletal: Chest wall soft tissues are unremarkable. No acute bony abnormality. CT ABDOMEN PELVIS FINDINGS Hepatobiliary: No focal hepatic abnormality. Gallbladder unremarkable. Pancreas: No focal abnormality or ductal dilatation. Spleen: No focal abnormality.  Normal size. Adrenals/Urinary Tract: Right renal pelvic stone measures 11 mm. Midpole left renal stone measures 5 mm. Mild fullness of the right renal pelvis. No frank hydronephrosis. Adrenal glands and urinary bladder unremarkable. 5.4 cm cyst off the lower pole of the left kidney. Stomach/Bowel: Large stool burden in the rectum. Descending colonic and sigmoid diverticulosis. No active diverticulitis. Appendix is normal. No evidence of bowel obstruction. Vascular/Lymphatic: Aortic atherosclerosis. Mild infrarenal abdominal aortic aneurysm, 3.1 cm. No adenopathy. Diffuse aortoiliac atherosclerosis. Reproductive: Postoperative changes in the pelvis likely related to prostatectomy. No focal mass. Other: No free fluid or free air. Musculoskeletal: No acute bony abnormality. IMPRESSION: Very large hiatal hernia containing much of the stomach and portions of the transverse colon. Compressive atelectasis or scarring in the adjacent lower lobes. Coronary artery disease, aortic atherosclerosis. Fluid-filled esophagus, likely related to reflux. Trace left pleural effusion. Bilateral renal stones, the largest in the right renal pelvis measuring 11 mm. Mild fullness of the renal pelvis without frank hydronephrosis. No ureteral stones. Large stool burden in the rectum, question fecal impaction. Sigmoid diverticulosis.  No active diverticulitis. 3.1 cm abdominal aortic aneurysm. Recommend follow-up ultrasound every 3 years. This recommendation follows ACR consensus guidelines: White Paper of the ACR Incidental Findings Committee II on Vascular Findings. J Am Coll Radiol 2013; 10:789-794. Electronically Signed   By: Rolm Baptise M.D.   On: 05/06/2020 08:48    DG Chest Port 1 View  Addendum Date: 05/06/2020   ADDENDUM REPORT: 05/06/2020 05:38 ADDENDUM: These results were called by telephone at the time of interpretation on 05/06/2020 at 5:38 am to provider Ripley Fraise , who verbally acknowledged these results. Electronically Signed   By: Iven Finn  M.D.   On: 05/06/2020 05:38   Result Date: 05/06/2020 CLINICAL DATA:  Shortness of breath EXAM: PORTABLE CHEST 1 VIEW COMPARISON:  X-ray esophagus 07/22/2019, chest x-ray 04/06/2018 FINDINGS: Redemonstration of a more prominent large hiatal hernia with suggestion of gaseous distension of the stomach. No definite pneumatosis. The heart size and mediastinal contours are difficult to evaluate given large hiatal hernia. Cardiomegaly. Aortic arch calcifications. Bilateral passive atelectasis. No focal consolidation. No pulmonary edema. No pleural effusion. No pneumothorax. No acute osseous abnormality. Multilevel degenerative changes of the spine. IMPRESSION: 1. More prominent large hiatal hernia with suggestion of gaseous distension of the stomach. Recommend CT with intravenous contrast for further evaluation. 2. Limited evaluation of the lung parenchyma given atelectasis. Electronically Signed: By: Iven Finn M.D. On: 05/06/2020 05:12    EKG: Independently reviewed.  Sinus tachycardia with rate 113; RBBB and LPFB with significant artifact   Labs on Admission: I have personally reviewed the available labs and imaging studies at the time of the admission.  Pertinent labs:   Glucose 188 BNP 426 WBC 19.8 Lactate 2.8 COVID/flu negative   Assessment/Plan Principal Problem:   Sepsis due to undetermined organism [facility]) Active Problems:   Essential hypertension   Dyslipidemia   Vascular dementia without behavioral disturbance ([place])   Paraesophageal hernia   DNR (do not resuscitate)   Sepsis, likely due to early PNA -Sepsis indicates life-threatening organ dysfunction with mortality >10%,  caused by dysregulation to host response.   -SIRS criteria in this patient includes: Leukocytosis, fever, tachycardia, tachypnea  -Patient has evidence of acute organ failure with elevated lactate >2; encephalopathy that is not easily explained by another condition. -While awaiting blood cultures, this appears to be a preseptic condition. -Sepsis protocol initiated -Suspected source is PNA given his hypoxia and tachypnea; with his unremarkable CXR, it may be too early for it to be appreciated radiologically. -Blood and urine cultures pending -Will admit due to:  hypoxemia; AMS that is severe or persistent -Treat with IV Flagyl/Cefepime/Vanc for undifferentiated sepsis - may be able to narrow coverage once source is confirmed -Will trend lactate to ensure improvement -Will order procalcitonin level.  Antibiotics would not be indicated for PCT <0.1 and probably should not be used for < 0.25.  >0.5 indicates infection and >>0.5 indicates more serious disease.  As the procalcitonin level normalizes, it will be reasonable to consider de-escalation of antibiotic coverage.  Vascular dementia with behavioral disturbance -h/o CVA -Nephew reports fairly rapid progression of late -Continue Plavix -Continue hydroxyzine, Ativan, Remeron, Effexor  HTN -Continue Avapro, lopressor  HLD -Continue Lipitor  H/o hypercalcemia -Continue Calcitriol -Appears to be controlled at this time  Paraesophageal hernia -Initial consideration was that the hernia was the source of his hypoxia -However, he does have SIRS criteria and elevated lactate and so this is less suspicious as the cause -He is likely not a candidate for repair -Continue Protonix but change to IV  Constipation -Will order disimpaction and bowel regimen -may be related to large hernia -Continue cholestyramine  DNR -I have discussed code status with the patient's family and after extensive discussion they are in agreement that the patient  would not desire resuscitation and would prefer to die a natural death should that situation arise. -He will need a gold out of facility DNR form at the time of discharge    Note: This patient has been tested and is negative for the novel coronavirus COVID-19. He has been fully vaccinated against COVID-19.   Level of care:  Telemetry Medical DVT prophylaxis:  Lovenox Code Status:  DNR - confirmed with family Family Communication: None present; I spoke with his nephew multiple times and his wife by telephone at the time of admission Disposition Plan:  The patient is from: SNF  Anticipated d/c is to: SNF  Anticipated d/c date will depend on clinical response to treatment, likely 2-3 days  Patient is currently: acutely ill Consults called: PT/OT/TOC team  Admission status: Admit - It is my clinical opinion that admission to INPATIENT is reasonable and necessary because of the expectation that this patient will require hospital care that crosses at least 2 midnights to treat this condition based on the medical complexity of the problems presented.  Given the aforementioned information, the predictability of an adverse outcome is felt to be significant.   Karmen Bongo MD [facility]   How to contact the [facility] Attending or Consulting provider [place] or covering provider during after hours [place], for this patient?  1. Check the care team in [facility] and look for a) attending/consulting [facility] provider listed and b) the [facility] team listed 2. Log into [email or web address] and use [place]'s universal password to access. If you do not have the password, please contact the hospital operator. 3. Locate the [facility] provider you are looking for under [facility] and page to a number that you can be directly reached. 4. If you still have difficulty reaching the provider, please page the [facility] (Director on Call) for the Hospitalists listed on amion for assistance.   05/06/2020, 12:25 PM

## 2020-05-06 NOTE — ED Notes (Signed)
Notified Dr. Lorin Mercy of elevated lactic acid of 2.8

## 2020-05-07 DIAGNOSIS — K449 Diaphragmatic hernia without obstruction or gangrene: Secondary | ICD-10-CM

## 2020-05-07 DIAGNOSIS — A419 Sepsis, unspecified organism: Secondary | ICD-10-CM | POA: Diagnosis not present

## 2020-05-07 DIAGNOSIS — I10 Essential (primary) hypertension: Secondary | ICD-10-CM | POA: Diagnosis not present

## 2020-05-07 LAB — URINALYSIS, ROUTINE W REFLEX MICROSCOPIC
Bilirubin Urine: NEGATIVE
Glucose, UA: NEGATIVE mg/dL
Ketones, ur: NEGATIVE mg/dL
Nitrite: NEGATIVE
Protein, ur: NEGATIVE mg/dL
RBC / HPF: >50 RBC/hpf — ABNORMAL HIGH (ref 0–5)
Specific Gravity, Urine: 1.027 (ref 1.005–1.030)
pH: 5 (ref 5.0–8.0)

## 2020-05-07 LAB — CBC
HCT: 40.3 % (ref 39.0–52.0)
Hemoglobin: 13.4 g/dL (ref 13.0–17.0)
MCH: 28.9 pg (ref 26.0–34.0)
MCHC: 33.3 g/dL (ref 30.0–36.0)
MCV: 87 fL (ref 80.0–100.0)
Platelets: 228 10*3/uL (ref 150–400)
RBC: 4.63 MIL/uL (ref 4.22–5.81)
RDW: 13.3 % (ref 11.5–15.5)
WBC: 20.9 10*3/uL — ABNORMAL HIGH (ref 4.0–10.5)
nRBC: 0 % (ref 0.0–0.2)

## 2020-05-07 LAB — BASIC METABOLIC PANEL
Anion gap: 11 (ref 5–15)
BUN: 26 mg/dL — ABNORMAL HIGH (ref 8–23)
CO2: 23 mmol/L (ref 22–32)
Calcium: 9.6 mg/dL (ref 8.9–10.3)
Chloride: 106 mmol/L (ref 98–111)
Creatinine, Ser: 1.07 mg/dL (ref 0.61–1.24)
GFR, Estimated: >60 mL/min (ref >60)
Glucose, Bld: 127 mg/dL — ABNORMAL HIGH (ref 70–99)
Potassium: 4 mmol/L (ref 3.5–5.1)
Sodium: 140 mmol/L (ref 135–145)

## 2020-05-07 LAB — MRSA PCR SCREENING: MRSA by PCR: POSITIVE — AB

## 2020-05-07 LAB — GLUCOSE, CAPILLARY: Glucose-Capillary: 99 mg/dL (ref 70–99)

## 2020-05-07 MED ORDER — BISACODYL 10 MG RE SUPP
10.0000 mg | Freq: Once | RECTAL | Status: AC
  Administered 2020-05-07: 10 mg via RECTAL
  Filled 2020-05-07: qty 1

## 2020-05-07 MED ORDER — CHLORHEXIDINE GLUCONATE CLOTH 2 % EX PADS
6.0000 | MEDICATED_PAD | Freq: Every day | CUTANEOUS | Status: DC
  Administered 2020-05-09 – 2020-05-10 (×2): 6 via TOPICAL

## 2020-05-07 MED ORDER — MUPIROCIN 2 % EX OINT
1.0000 "application " | TOPICAL_OINTMENT | Freq: Two times a day (BID) | CUTANEOUS | Status: DC
  Administered 2020-05-07 – 2020-05-11 (×9): 1 via NASAL
  Filled 2020-05-07: qty 22

## 2020-05-07 MED ORDER — FUROSEMIDE 10 MG/ML IJ SOLN
20.0000 mg | Freq: Once | INTRAMUSCULAR | Status: AC
  Administered 2020-05-07: 20 mg via INTRAVENOUS
  Filled 2020-05-07: qty 2

## 2020-05-07 NOTE — Progress Notes (Signed)
New Admission Note:   Arrival Method:  [place] Mental Orientation: confused alert to self Telemetry: box 12 Assessment: Completed Skin: see flow sheet IV: Rt Forearm Pain: none Tubes: none Safety Measures: Safety Fall Prevention Plan in place and discused Admission: in progess pt confused [facility] [place] Orientation: Patient has been orientated to the room, unit and staff.  Family:none at bedside  Orders have been reviewed and implemented. Will continue to monitor the patient. Call light has been placed within reach and bed alarm has been activated.   Rockie Neighbours BSN, RN Phone number: [phone]

## 2020-05-07 NOTE — Evaluation (Signed)
Physical Therapy Evaluation Patient Details Name: [name] Duffy MRN: [id number] DOB: [birth year] Today's Date: 05/07/2020   History of Present Illness  87 y.o. male with medical history significant of prostate CA; dementia; HTN; L CVA (2020) and HLD presenting with hypoxia. Pt admitted  for sepsis.    Clinical Impression  Pt admitted with above diagnosis. Pt admitted from [facility]. Unsure of baseline mobility level. On eval, pt required total assist bed mobility. Attempted placing bed in chair position but pt grabbed L bed rail pulling trunk to L. Pt resisting attempts to bring trunk back to midline; therefore, required return to bed position. Pt nonverbal during session. Not following commands. Pt currently with functional limitations due to the deficits listed below (see PT Problem List). Pt will benefit from skilled PT to increase their independence and safety with mobility to allow discharge to the venue listed below.  PT will follow to further determine pt's ability to participate.      Follow Up Recommendations SNF    Equipment Recommendations  None recommended by PT    Recommendations for Other Services       Precautions / Restrictions Precautions Precautions: Fall      Mobility  Bed Mobility Overal bed mobility: Needs Assistance Bed Mobility: Rolling Rolling: Total assist         General bed mobility comments: total assist rolling R/L. No purposeful active participation from pt.    Transfers                 General transfer comment: unable  Ambulation/Gait                Stairs            Wheelchair Mobility    Modified Rankin (Stroke Patients Only)       Balance                                             Pertinent Vitals/Pain Pain Assessment: Faces Faces Pain Scale: Hurts little more Pain Location: generalized with mobility Pain Descriptors / Indicators: Grimacing;Guarding Pain Intervention(s):  Monitored during session;Limited activity within patient's tolerance    Home Living Family/patient expects to be discharged to:: Skilled nursing facility                      Prior Function Level of Independence: Needs assistance   Gait / Transfers Assistance Needed: unsure of mobility status at SNF     Comments: Pt resides at [facility].     Hand Dominance   Dominant Hand: Right    Extremity/Trunk Assessment   Upper Extremity Assessment Upper Extremity Assessment: Defer to OT evaluation    Lower Extremity Assessment Lower Extremity Assessment: RLE deficits/detail;LLE deficits/detail RLE Deficits / Details: increased extensor tone, able to passively move through ROM, pt demonstrates spontaneous AROM LLE Deficits / Details: strong extensor tone, unable to break tone to move through ROM       Communication      Cognition Arousal/Alertness: Lethargic Behavior During Therapy: Flat affect;Impulsive Overall Cognitive Status: Difficult to assess                                 General Comments: Nonverbal during session. Impulsive at times, grabbing bed rail and kicking leg. Not following  commands. Ill appearing.      General Comments General comments (skin integrity, edema, etc.): SpO2 99% on 3L. HR 105 supine in bed.    Exercises     Assessment/Plan    PT Assessment Patient needs continued PT services  PT Problem List Decreased strength;Decreased mobility;Decreased activity tolerance;Cardiopulmonary status limiting activity;Pain;Decreased balance;Decreased knowledge of use of DME;Decreased cognition       PT Treatment Interventions DME instruction;Therapeutic activities;Cognitive remediation;Therapeutic exercise;Patient/family education;Balance training;Functional mobility training    PT Goals (Current goals can be found in the Care Plan section)  Acute Rehab PT Goals Patient Stated Goal: unable to state PT Goal Formulation: Patient unable  to participate in goal setting Time For Goal Achievement: 05/21/20 Potential to Achieve Goals: Poor    Frequency Min 2X/week   Barriers to discharge        Co-evaluation               AM-PAC PT "6 Clicks" Mobility  Outcome Measure Help needed turning from your back to your side while in a flat bed without using bedrails?: Total Help needed moving from lying on your back to sitting on the side of a flat bed without using bedrails?: Total Help needed moving to and from a bed to a chair (including a wheelchair)?: Total Help needed standing up from a chair using your arms (e.g., wheelchair or bedside chair)?: Total Help needed to walk in hospital room?: Total Help needed climbing 3-5 steps with a railing? : Total 6 Click Score: 6    End of Session Equipment Utilized During Treatment: Oxygen Activity Tolerance: Treatment limited secondary to medical complications (Comment) (advanced dementia, illness) Patient left: in bed;with bed alarm set;with call bell/phone within reach Nurse Communication: Mobility status PT Visit Diagnosis: Other abnormalities of gait and mobility (R26.89);Muscle weakness (generalized) (M62.81)    Time: 0569-7948 PT Time Calculation (min) (ACUTE ONLY): 11 min   Charges:   PT Evaluation $PT Eval Moderate Complexity: 1 Mod          Lorrin Goodell, PT  Office # [phone] Pager [phone]   Lorriane Shire 05/07/2020, 10:03 AM

## 2020-05-07 NOTE — Progress Notes (Signed)
PROGRESS NOTE    [name] Duffy  BPZ:[id number] DOB: [birth year] DOA: 05/06/2020 PCP: Gayland Curry, DO   Brief Narrative:  Patient is a 87 year old male with a past medical history significant for prostate CA, dementia, HTN, and HLD who presented to the ED with hypoxia.  He lives at [facility] and was last seen at his facility on 1/20.  He is DNR and has vascular dementia with history of CVA and periods of agitation.  He takes Protonix and Pepcid for a paraesophageal hernia.  The patient's wife doesn't have a clear picture as to why he was sent to the hospital but was told he was SOB and "highly agitated".  The admitting provider after having an at length conversation with the wife and nephew were in agreement to patient being a DNR status.  He has had a quick progression of vascular dementia with periodic episodes of being unable to communicate.  In the ED, developed a new oxygen requirement as he is on RA at baseline with sats. At 88 % on RA. CXR/CT showed large hiatal hernia.  In the ED was placed on Bipap and later transitioned in [place] O2. Patient is COVID negative.    Assessment & Plan:   Principal Problem:   Sepsis due to undetermined organism [facility]) Active Problems:   Essential hypertension   Dyslipidemia   Vascular dementia without behavioral disturbance ([place])   Paraesophageal hernia   DNR (do not resuscitate)   Sepsis, undetermined organism likely due to early PNA -SIRS criteria for sepsis met including leukocytosis, fever, tachycardia, tachypnea, lactate >2, and encephalopathy/altered mental status -Sepsis protocol initiated -Blood cultures x2 obtained, pending results -Urinalysis and culture ordered, pending results -Possible cause early pneumonia which may not yet show on CXR, but symptoms of tachypnea and hypoxia consistent with possible pneumonia -Broad spectrum antibiotics started with  IV Flagyl/Cefepime/Vanc for undifferentiated sepsis, while pending culture  and sensitivity -Trend lactate levels and temperature -Trend procalcitonin, currently 0.1 -Antibiotic deescalation with Flagyl stopped -Called to bedside by report by bedside RN that patient had tremors of hands and legs and decreased oxygen saturation in the upper 80's.  -Patient appeared to have distended abdomen with generalized distress, with muscle tremors, hiccups, tachypnea, and increased oxygen demands.  -Now resting comfortably, titrate oxygen to maintain saturations > 92 %, Lasix 20 mg IV once    Vascular dementia with behavioral disturbance with history of CVA -Pr nephew reports fairly rapid progression of late -Continue Plavix, hydroxyzine, Ativan, Remeron, Effexor  HTN -Continue Avapro, lopressor  HLD -Continue Lipitor  H/o hypercalcemia, chronic/stable -Continue Calcitriol  Paraesophageal hernia chronic/stable -Unlikely to be the source of current hypoxia considering elevated lactate levels -Continue Protonix PPI  Constipation, per CT abdomen/pelvis, potentially related to large hernia and large stool burden with possible impaction -Continue bowel regimen -Continue cholestyramine -Dulcolax suppository as needed  MRSA infection, nares -Start muprirocin   DVT prophylaxis: Lovenox Code Status: DNR Family Communication:  Nephew at bedside  Status is: Inpatient   Dispo: The patient is from: SNF              Anticipated d/c is to: SNF              Anticipated d/c date is: 05/11/20              Patient currently is not medically stable for discharge.   Difficult to place patient: No    Body mass index is 23.09 kg/m.   Consultants:  None  Procedures:   None  Antimicrobials:   Vancomycin, cefepime, metronidazole   Subjective:  Patient seen and examined a bedside.  Alert to self only minimally able to follow commands.  Patient's nephew came to bedside this afternoon, reported patient had hiccups and shaking/tremors of bilateral hands and  bilateral feet.  Review of Systems Unable to obtain full ROS due to dementia and encephalopathy Otherwise negative except as per HPI, including: General: Denies fever, chills, night sweats or unintended weight loss. Resp: Denies cough, wheezing, shortness of breath. Cardiac: Denies chest pain, palpitations, orthopnea, paroxysmal nocturnal dyspnea. GI: Denies abdominal pain, nausea, vomiting, diarrhea or constipation GU: Denies dysuria, frequency, hesitancy or incontinence MS: Denies muscle aches, joint pain or swelling Neuro: Denies headache, neurologic deficits (focal weakness, numbness, tingling), abnormal gait Psych: Denies anxiety, depression, SI/HI/AVH Skin: Denies new rashes or lesions ID: Denies sick contacts, exotic exposures, travel  Examination:  General exam: Appears calm and comfortable this am, but appeared in distress with tremors this afternoon.  Awake, but not able to engage in conversation. Respiratory system: Clear to auscultation. Respiratory effort increased, with abdominal breathing. Cardiovascular system: S1 & S2 heard, RR with tachycardia. No JVD, murmurs, rubs, gallops or clicks. No pedal edema. Gastrointestinal system: Abdomen is distended, but soft and non-tender. No organomegaly or masses felt. Hypoactive bowel sounds heard. Central nervous system: Alert and oriented. No focal neurological deficits. Extremities: Unable to follow commands to assess strength. Scattered bruises on arms and legs. Skin: No rashes, lesions or ulcers Psychiatry: Alert, but unable to determine mood or affect due to baseline dementia and encephalopathy.   Objective: Vitals:   05/07/20 0433 05/07/20 1106 05/07/20 1157 05/07/20 1158  BP: (!) 158/99 128/81 (!) 162/98 (!) 162/98  Pulse: 97 82 97 97  Resp: '20 16  18  ' Temp: 97.8 F (36.6 C) 99.1 F (37.3 C)  98.5 F (36.9 C)  TempSrc:  Oral  Oral  SpO2: 100% 100%  98%  Weight:      Height:        Intake/Output Summary (Last 24  hours) at 05/07/2020 1306 Last data filed at 05/07/2020 1127 Gross per 24 hour  Intake 2000.07 ml  Output 0 ml  Net 2000.07 ml   Filed Weights   05/06/20 1000  Weight: 79.4 kg     Data Reviewed:   CBC: Recent Labs  Lab 05/06/20 0453 05/07/20 0331  WBC 19.8* 20.9*  NEUTROABS 15.2*  --   HGB 15.8 13.4  HCT 49.1 40.3  MCV 88.9 87.0  PLT 287 384   Basic Metabolic Panel: Recent Labs  Lab 05/06/20 0453 05/07/20 0331  NA 139 140  K 5.1 4.0  CL 104 106  CO2 23 23  GLUCOSE 188* 127*  BUN 19 26*  CREATININE 1.19 1.07  CALCIUM 9.9 9.6   GFR: Estimated Creatinine Clearance: 53.6 mL/min (by C-G formula based on SCr of 1.07 mg/dL). Liver Function Tests: Recent Labs  Lab 05/06/20 0909  AST 21  ALT 24  ALKPHOS 75  BILITOT 0.9  PROT 6.5  ALBUMIN 3.6   No results for input(s): LIPASE, AMYLASE in the last 168 hours. No results for input(s): AMMONIA in the last 168 hours. Coagulation Profile: No results for input(s): INR, PROTIME in the last 168 hours. Cardiac Enzymes: No results for input(s): CKTOTAL, CKMB, CKMBINDEX, TROPONINI in the last 168 hours. BNP (last 3 results) Recent Labs    07/14/19 0000  PROBNP 66.00   HbA1C: No results for input(s): HGBA1C  in the last 72 hours. CBG: Recent Labs  Lab 05/07/20 1152  GLUCAP 99   Lipid Profile: No results for input(s): CHOL, HDL, LDLCALC, TRIG, CHOLHDL, LDLDIRECT in the last 72 hours. Thyroid Function Tests: No results for input(s): TSH, T4TOTAL, FREET4, T3FREE, THYROIDAB in the last 72 hours. Anemia Panel: No results for input(s): VITAMINB12, FOLATE, FERRITIN, TIBC, IRON, RETICCTPCT in the last 72 hours. Sepsis Labs: Recent Labs  Lab 05/06/20 0849 05/06/20 0909 05/06/20 2002  PROCALCITON  --  <0.10  --   LATICACIDVEN 2.8*  --  4.8*    Recent Results (from the past 240 hour(s))  Resp Panel by RT-PCR (Flu A&B, Covid) Nasopharyngeal Swab     Status: None   Collection Time: 05/06/20  5:26 AM   Specimen:  Nasopharyngeal Swab; Nasopharyngeal(NP) swabs in vial transport medium  Result Value Ref Range Status   SARS Coronavirus 2 by RT PCR NEGATIVE NEGATIVE Final    Comment: (NOTE) SARS-CoV-2 target nucleic acids are NOT DETECTED.  The SARS-CoV-2 RNA is generally detectable in upper respiratory specimens during the acute phase of infection. The lowest concentration of SARS-CoV-2 viral copies this assay can detect is 138 copies/mL. A negative result does not preclude SARS-Cov-2 infection and should not be used as the sole basis for treatment or other patient management decisions. A negative result may occur with  improper specimen collection/handling, submission of specimen other than nasopharyngeal swab, presence of viral mutation(s) within the areas targeted by this assay, and inadequate number of viral copies(<138 copies/mL). A negative result must be combined with clinical observations, patient history, and epidemiological information. The expected result is Negative.  Fact Sheet for Patients:  EntrepreneurPulse.com.au  Fact Sheet for Healthcare Providers:  IncredibleEmployment.be  This test is no t yet approved or cleared by the [place] FDA and  has been authorized for detection and/or diagnosis of SARS-CoV-2 by FDA under an Emergency Use Authorization (EUA). This EUA will remain  in effect (meaning this test can be used) for the duration of the COVID-19 declaration under Section 564(b)(1) of the Act, 21 U.S.C.section 360bbb-3(b)(1), unless the authorization is terminated  or revoked sooner.       Influenza A by PCR NEGATIVE NEGATIVE Final   Influenza B by PCR NEGATIVE NEGATIVE Final    Comment: (NOTE) The Xpert Xpress SARS-CoV-2/FLU/RSV plus assay is intended as an aid in the diagnosis of influenza from Nasopharyngeal swab specimens and should not be used as a sole basis for treatment. Nasal washings and aspirates are unacceptable for  Xpert Xpress SARS-CoV-2/FLU/RSV testing.  Fact Sheet for Patients: EntrepreneurPulse.com.au  Fact Sheet for Healthcare Providers: IncredibleEmployment.be  This test is not yet approved or cleared by the [place] FDA and has been authorized for detection and/or diagnosis of SARS-CoV-2 by FDA under an Emergency Use Authorization (EUA). This EUA will remain in effect (meaning this test can be used) for the duration of the COVID-19 declaration under Section 564(b)(1) of the Act, 21 U.S.C. section 360bbb-3(b)(1), unless the authorization is terminated or revoked.  Performed at [facility], [place] [address] [place], [place] [address]          Radiology Studies: CT CHEST ABDOMEN PELVIS W CONTRAST  Result Date: 05/06/2020 CLINICAL DATA:  Chest pain, shortness of breath EXAM: CT CHEST, ABDOMEN, AND PELVIS WITH CONTRAST TECHNIQUE: Multidetector CT imaging of the chest, abdomen and pelvis was performed following the standard protocol during bolus administration of intravenous contrast. CONTRAST:  181m OMNIPAQUE IOHEXOL 300 MG/ML  SOLN  COMPARISON:  None. FINDINGS: CT CHEST FINDINGS Cardiovascular: Heart is upper limits normal in size. Coronary artery and aortic calcifications. Tortuous aorta. No aneurysm. Mediastinum/Nodes: Large hiatal hernia with most of the stomach as well as portions of the transverse colon noted in the hernia. Esophagus is fluid-filled, likely related to reflux. No mediastinal, hilar, or axillary adenopathy. Thyroid unremarkable. Lungs/Pleura: Bibasilar compressive atelectasis due to the large hernia versus scarring. Trace left effusion. Musculoskeletal: Chest wall soft tissues are unremarkable. No acute bony abnormality. CT ABDOMEN PELVIS FINDINGS Hepatobiliary: No focal hepatic abnormality. Gallbladder unremarkable. Pancreas: No focal abnormality or ductal dilatation. Spleen: No focal abnormality.  Normal size. Adrenals/Urinary  Tract: Right renal pelvic stone measures 11 mm. Midpole left renal stone measures 5 mm. Mild fullness of the right renal pelvis. No frank hydronephrosis. Adrenal glands and urinary bladder unremarkable. 5.4 cm cyst off the lower pole of the left kidney. Stomach/Bowel: Large stool burden in the rectum. Descending colonic and sigmoid diverticulosis. No active diverticulitis. Appendix is normal. No evidence of bowel obstruction. Vascular/Lymphatic: Aortic atherosclerosis. Mild infrarenal abdominal aortic aneurysm, 3.1 cm. No adenopathy. Diffuse aortoiliac atherosclerosis. Reproductive: Postoperative changes in the pelvis likely related to prostatectomy. No focal mass. Other: No free fluid or free air. Musculoskeletal: No acute bony abnormality. IMPRESSION: Very large hiatal hernia containing much of the stomach and portions of the transverse colon. Compressive atelectasis or scarring in the adjacent lower lobes. Coronary artery disease, aortic atherosclerosis. Fluid-filled esophagus, likely related to reflux. Trace left pleural effusion. Bilateral renal stones, the largest in the right renal pelvis measuring 11 mm. Mild fullness of the renal pelvis without frank hydronephrosis. No ureteral stones. Large stool burden in the rectum, question fecal impaction. Sigmoid diverticulosis.  No active diverticulitis. 3.1 cm abdominal aortic aneurysm. Recommend follow-up ultrasound every 3 years. This recommendation follows ACR consensus guidelines: White Paper of the ACR Incidental Findings Committee II on Vascular Findings. J Am Coll Radiol 2013; 10:789-794. Electronically Signed   By: Rolm Baptise M.D.   On: 05/06/2020 08:48   DG Chest Port 1 View  Addendum Date: 05/06/2020   ADDENDUM REPORT: 05/06/2020 05:38 ADDENDUM: These results were called by telephone at the time of interpretation on 05/06/2020 at 5:38 am to provider Ripley Fraise , who verbally acknowledged these results. Electronically Signed   By: Iven Finn  M.D.   On: 05/06/2020 05:38   Result Date: 05/06/2020 CLINICAL DATA:  Shortness of breath EXAM: PORTABLE CHEST 1 VIEW COMPARISON:  X-ray esophagus 07/22/2019, chest x-ray 04/06/2018 FINDINGS: Redemonstration of a more prominent large hiatal hernia with suggestion of gaseous distension of the stomach. No definite pneumatosis. The heart size and mediastinal contours are difficult to evaluate given large hiatal hernia. Cardiomegaly. Aortic arch calcifications. Bilateral passive atelectasis. No focal consolidation. No pulmonary edema. No pleural effusion. No pneumothorax. No acute osseous abnormality. Multilevel degenerative changes of the spine. IMPRESSION: 1. More prominent large hiatal hernia with suggestion of gaseous distension of the stomach. Recommend CT with intravenous contrast for further evaluation. 2. Limited evaluation of the lung parenchyma given atelectasis. Electronically Signed: By: Iven Finn M.D. On: 05/06/2020 05:12        Scheduled Meds: . atorvastatin  20 mg Oral q1800  . calcitRIOL  0.25 mcg Oral Daily  . cholestyramine  4 g Oral BID  . clopidogrel  75 mg Oral Daily  . enoxaparin (LOVENOX) injection  40 mg Subcutaneous Q24H  . irbesartan  300 mg Oral Daily  . latanoprost  1 drop Right Eye QHS  .  LORazepam  0.5 mg Oral BID  . metoprolol tartrate  25 mg Oral BID  . mirtazapine  30 mg Oral QHS  . pantoprazole (PROTONIX) IV  40 mg Intravenous Q12H  . sodium chloride flush  3 mL Intravenous Q12H  . venlafaxine XR  75 mg Oral Q breakfast   Continuous Infusions: . ceFEPime (MAXIPIME) IV 2 g (05/07/20 1133)  . lactated ringers 75 mL/hr at 05/07/20 1127  . metronidazole Stopped (05/07/20 0345)  . vancomycin       LOS: 1 day   Time spent= 35 mins   Dede Query, RN NP-Student   If 7PM-7AM, please contact night-coverage  05/07/2020, 1:06 PM

## 2020-05-07 NOTE — Evaluation (Signed)
Occupational Therapy Evaluation Patient Details Name: [name] Duffy MRN: [id number] DOB: 1929-11-19 Today's Date: 05/07/2020    History of Present Illness 87 y.o. male with medical history significant of prostate CA; dementia; HTN; L CVA (2020) and HLD presenting with hypoxia. Pt admitted  for sepsis.   Clinical Impression   PTA, pt from SNF. History obtained from nephew who was present. Pt typically requires use of mechanical lift for transfers, nonambulatory, and receives extensive assist for LB ADLs. Pt typically able to converse with intermittent periods of confusion. Pt nonverbal today, inconsistent following of commands (nephew reports pt wears hearing aides usually). Assisted NT with bed level ADLs with Total A x 1-2 required for rolling in bed and Total A for all ADLs. Pt with pushing tendencies and prolonged strong grasp of objects within reach. Plan to address simple ADLs, command following and ability to vocalize needs in future sessions.   Pt received on 4 L O2. Tremulous throughout extremities. After ADLs completed, SpO2 at 78%. Increased to 6 L with recovery to 99% within 2 minutes and tremors subsiding. Titrated back down to 4 L O2. RN, NT, and MD present during O2 monitoring. Pt does not typically wear O2 at baseline.     Follow Up Recommendations  SNF;Supervision/Assistance - 24 hour (return to SNF)    Equipment Recommendations  None recommended by OT    Recommendations for Other Services       Precautions / Restrictions Precautions Precautions: Fall Precaution Comments: monitor O2 Restrictions Weight Bearing Restrictions: No      Mobility Bed Mobility Overal bed mobility: Needs Assistance Bed Mobility: Rolling Rolling: Total assist;+2 for physical assistance;+2 for safety/equipment         General bed mobility comments: TotaL A x 1-2 with strong pushing tendencies noted. Difficulty sequencing requiring multimodal cueing. Did appear to follow NT's  command for last roll on back with linen change (NT with louder volume - question HOH as impact of following commands)    Transfers                 General transfer comment: unable, mechanical lift at baseline    Balance                                           ADL either performed or assessed with clinical judgement   ADL Overall ADL's : Needs assistance/impaired Eating/Feeding: Total assistance;Bed level   Grooming: Total assistance;Bed level   Upper Body Bathing: Total assistance;Bed level   Lower Body Bathing: Total assistance;Bed level Lower Body Bathing Details (indicate cue type and reason): NT assisting with LB bathing in bed, requires 2 person assist to roll due to strong pushing tendencies Upper Body Dressing : Total assistance;Bed level Upper Body Dressing Details (indicate cue type and reason): Total A to don gown in bed, unable to follow commands and pushing tendencies noted Lower Body Dressing: Total assistance;Bed level Lower Body Dressing Details (indicate cue type and reason): for socks bed level     Toileting- Clothing Manipulation and Hygiene: Total assistance;Bed level         General ADL Comments: Total A for all ADLs, likely Total A for LB ADLs at baseline but per nephew, seems as pt may be able to assist with simple UB ADLs at baseline.     Vision Baseline Vision/History: Wears glasses Wears Glasses: At all times Patient  Visual Report: No change from baseline Vision Assessment?: No apparent visual deficits     Perception     Praxis      Pertinent Vitals/Pain Pain Assessment: Faces Faces Pain Scale: Hurts little more Pain Location: when rolling to R side, generalized. pt unable to specify pain location Pain Descriptors / Indicators: Guarding;Moaning Pain Intervention(s): Monitored during session;Repositioned;Limited activity within patient's tolerance     Hand Dominance Right   Extremity/Trunk Assessment Upper  Extremity Assessment Upper Extremity Assessment: RUE deficits/detail;LUE deficits/detail;Difficult to assess due to impaired cognition RUE Deficits / Details: strong grasp, pushing tendencies when attempting to bend elbow. LUE Deficits / Details: strong grasp, difficulty relaxing grip in this hand. pushing tendencies   Lower Extremity Assessment Lower Extremity Assessment: Defer to PT evaluation RLE Deficits / Details: increased extensor tone, able to passively move through ROM, pt demonstrates spontaneous AROM LLE Deficits / Details: strong extensor tone, unable to break tone to move through ROM       Communication Communication Communication: HOH;Other (comment) (questionable decreased responses - from [facility] (wears hearing aides at baseline) or difficulty expressing needs)   Cognition Arousal/Alertness: Lethargic Behavior During Therapy: Flat affect Overall Cognitive Status: Difficult to assess                                 General Comments: Pt nonverbal during session (per nephew, pt typically talks but does also wear hearing aides which were not in at the time). Pt noted with pushing tendencies, strong grip on any objects nearby (bed rail, person, etc). Did not follow one step commands consistently   General Comments  Pt nephew present during session as well as NT and RN. Pt noted to be tremulous in all extremities. Assessed O2 after ADLs in bed on 4 L O2 at 78%. With increase to 6 L O2, SpO2 99% (on personal pulse ox and dynamap) and tremors subsided. Titrated back down to 4 L O2 with stable SpO2    Exercises     Shoulder Instructions      Home Living Family/patient expects to be discharged to:: Skilled nursing facility                                 Additional Comments: From [facility] SNF      Prior Functioning/Environment Level of Independence: Needs assistance  Gait / Transfers Assistance Needed: Requires mechanical lift for transfers, bed  <> wc or shower chair, etc ADL's / Homemaking Assistance Needed: Requires lift for showering tasks, extensive assist for LB ADLs   Comments: Information obtained from nephew who was present        OT Problem List: Decreased strength;Decreased range of motion;Decreased activity tolerance;Decreased coordination;Decreased cognition;Cardiopulmonary status limiting activity;Impaired UE functional use      OT Treatment/Interventions: Self-care/ADL training;Therapeutic exercise;Energy conservation;DME and/or AE instruction;Therapeutic activities;Patient/family education;Balance training    OT Goals(Current goals can be found in the care plan section) Acute Rehab OT Goals Patient Stated Goal: determine medical stability, pt to be able to converse again OT Goal Formulation: With patient/family Time For Goal Achievement: 05/21/20 Potential to Achieve Goals: Fair ADL Goals Pt Will Perform Eating: with min assist;bed level Pt Will Perform Grooming: with min assist;bed level Pt Will Perform Upper Body Dressing: with mod assist;bed level Additional ADL Goal #1: Pt to be able to follow one step commands > 50% of the  time during daily tasks. (consider if hearing aides needed for command following) Additional ADL Goal #2: Pt to be able to verbalize needs > 50% of the time with min cues to maximize quality of life and participation  OT Frequency: Min 2X/week   Barriers to D/C:            Co-evaluation              AM-PAC OT "6 Clicks" Daily Activity     Outcome Measure Help from another person eating meals?: Total Help from another person taking care of personal grooming?: Total Help from another person toileting, which includes using toliet, bedpan, or urinal?: Total Help from another person bathing (including washing, rinsing, drying)?: Total Help from another person to put on and taking off regular upper body clothing?: Total Help from another person to put on and taking off regular  lower body clothing?: Total 6 Click Score: 6   End of Session Equipment Utilized During Treatment: Oxygen Nurse Communication: Mobility status;Other (comment) (O2 sats, NT/RN present as well as MD at end of eval)  Activity Tolerance: Patient limited by fatigue;Patient limited by lethargy Patient left: in bed;with call bell/phone within reach;with nursing/sitter in room;with family/visitor present  OT Visit Diagnosis: Muscle weakness (generalized) (M62.81);Other symptoms and signs involving cognitive function;Ataxia, unspecified (R27.0);Apraxia (R48.2)                Time: 5003-7048 OT Time Calculation (min): 20 min Charges:  OT General Charges $OT Visit: 1 Visit OT Evaluation $OT Eval Moderate Complexity: 1 Mod  Malachy Chamber, OTR/L Acute Rehab Services Office: [phone]  Layla Maw 05/07/2020, 12:22 PM

## 2020-05-08 DIAGNOSIS — R0902 Hypoxemia: Secondary | ICD-10-CM | POA: Diagnosis not present

## 2020-05-08 DIAGNOSIS — I10 Essential (primary) hypertension: Secondary | ICD-10-CM | POA: Diagnosis not present

## 2020-05-08 DIAGNOSIS — K449 Diaphragmatic hernia without obstruction or gangrene: Secondary | ICD-10-CM | POA: Diagnosis not present

## 2020-05-08 LAB — CBC
HCT: 43.7 % (ref 39.0–52.0)
Hemoglobin: 14.2 g/dL (ref 13.0–17.0)
MCH: 28.4 pg (ref 26.0–34.0)
MCHC: 32.5 g/dL (ref 30.0–36.0)
MCV: 87.4 fL (ref 80.0–100.0)
Platelets: 215 10*3/uL (ref 150–400)
RBC: 5 MIL/uL (ref 4.22–5.81)
RDW: 13.2 % (ref 11.5–15.5)
WBC: 19 10*3/uL — ABNORMAL HIGH (ref 4.0–10.5)
nRBC: 0 % (ref 0.0–0.2)

## 2020-05-08 LAB — URINE CULTURE: Culture: NO GROWTH

## 2020-05-08 LAB — BASIC METABOLIC PANEL
Anion gap: 11 (ref 5–15)
BUN: 19 mg/dL (ref 8–23)
CO2: 22 mmol/L (ref 22–32)
Calcium: 9.7 mg/dL (ref 8.9–10.3)
Chloride: 106 mmol/L (ref 98–111)
Creatinine, Ser: 0.91 mg/dL (ref 0.61–1.24)
GFR, Estimated: >60 mL/min (ref >60)
Glucose, Bld: 94 mg/dL (ref 70–99)
Potassium: 3.6 mmol/L (ref 3.5–5.1)
Sodium: 139 mmol/L (ref 135–145)

## 2020-05-08 MED ORDER — FUROSEMIDE 10 MG/ML IJ SOLN
20.0000 mg | Freq: Once | INTRAMUSCULAR | Status: AC
  Administered 2020-05-08: 20 mg via INTRAVENOUS
  Filled 2020-05-08: qty 2

## 2020-05-08 MED ORDER — HYDRALAZINE HCL 20 MG/ML IJ SOLN
10.0000 mg | Freq: Once | INTRAMUSCULAR | Status: AC
  Administered 2020-05-08: 10 mg via INTRAVENOUS
  Filled 2020-05-08: qty 1

## 2020-05-08 MED ORDER — SODIUM CHLORIDE 0.9 % IV SOLN
2.0000 g | Freq: Three times a day (TID) | INTRAVENOUS | Status: AC
  Administered 2020-05-08 – 2020-05-10 (×8): 2 g via INTRAVENOUS
  Filled 2020-05-08 (×8): qty 2

## 2020-05-08 NOTE — NC FL2 (Signed)
[place] LEVEL OF CARE SCREENING TOOL     IDENTIFICATION  Patient Name: [name] Duffy Birthdate: Jun 24, 1929 Sex: male Admission Date (Current Location): 05/06/2020  [facility] and [place] Number:  Herbalist and Address:  [facility], [place] [address] [place], [place] [address]      Provider Number: [id number]  Attending Physician Name and Address:  Oswald Hillock, MD  Relative Name and Phone Number:  Antonie, [place] [phone]    Current Level of Care: Hospital Recommended Level of Care: [place] Prior Approval Number:    Date Approved/Denied:   PASRR Number: [id number]  Discharge Plan: SNF    Current Diagnoses: Patient Active Problem List   Diagnosis Date Noted  . Sepsis due to undetermined organism ([place]) 05/06/2020  . DNR (do not resuscitate) 05/06/2020  . Hypercalcemia 04/08/2020  . Dyspnea on exertion 10/09/2019  . 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 Vascular dementia without behavioral disturbance ([place]) 03/10/2019  . Cerebellar dysfunction 03/10/2019  . Incontinence of feces with fecal urgency 03/10/2019  . Functional urinary incontinence 03/10/2019  . Frequent falls 03/10/2019  . Weakness of right lower extremity 07/25/2018  . Abnormality of gait 05/30/2018  . Hypoalbuminemia due to protein-calorie malnutrition ([place])   . Essential hypertension   . Glaucoma   . Dyslipidemia   . Left sided lacunar stroke ([place]) 04/10/2018  . Acute CVA (cerebrovascular accident) ([place]) 04/06/2018  . CVA (cerebral vascular accident) ([place]) 04/06/2018  . 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 Open angle with borderline findings, low risk, unspecified eye 10/12/2014  . Primary open angle glaucoma 10/12/2014  . Anxiety state 07/23/2013  . Genuine stress incontinence, male 12/13/2011  . H/O malignant neoplasm of prostate  01/28/2011    Orientation RESPIRATION BLADDER Height & Weight     Self  O2 (2L) Incontinent,External catheter Weight: 175 lb (79.4 kg) Height:  6\' 1"  (185.4 cm)  BEHAVIORAL SYMPTOMS/MOOD NEUROLOGICAL BOWEL NUTRITION STATUS      Continent Diet (see d/c summary)  AMBULATORY STATUS COMMUNICATION OF NEEDS Skin   Extensive Assist Verbally (minimally conversant and does not answer questions) Normal                       Personal Care Assistance Level of Assistance  Bathing,Feeding,Dressing Bathing Assistance: Maximum assistance Feeding assistance: Maximum assistance Dressing Assistance: Maximum assistance     Functional Limitations Info  Sight,Hearing,Speech Sight Info: Impaired (wears glasses) Hearing Info: Impaired (Hearing aid) Speech Info: Impaired (minimally conversant and does not answer questions)    SPECIAL CARE FACTORS FREQUENCY  PT (By licensed PT),OT (By licensed OT)     PT Frequency: 5X per week OT Frequency: 5X per week            Contractures Contractures Info: Not present    Additional Factors Info  Code Status,Allergies Code Status Info: DNR Allergies Info: Rofecoxib           Current Medications (05/08/2020):  This is the current hospital active medication list Current Facility-Administered Medications  Medication Dose Route Frequency Provider Last Rate Last Admin  . acetaminophen (TYLENOL) tablet 650 mg  650 mg Oral Q6H PRN Karmen Bongo, MD       Or  . acetaminophen (TYLENOL) suppository 650 mg  650 mg Rectal Q6H PRN Karmen Bongo, MD      . atorvastatin (LIPITOR) tablet 20 mg  20 mg Oral q1800 Karmen Bongo, MD   20 mg at 05/07/20 1718  . calcitRIOL (ROCALTROL) capsule 0.25 mcg  0.25 mcg Oral Daily Karmen Bongo, MD   0.25 mcg at 05/08/20 1019  . ceFEPIme (MAXIPIME) 2 g in sodium chloride 0.9 % 100 mL IVPB  2 g Intravenous Q8H Einar Grad, RPH      . Chlorhexidine Gluconate Cloth 2 % PADS 6 each  6 each Topical Q0600 Darrick Meigs,  Marge Duncans, MD      . cholestyramine Lucrezia Starch) packet 4 g  4 g Oral BID Karmen Bongo, MD   4 g at 05/08/20 1019  . clopidogrel (PLAVIX) tablet 75 mg  75 mg Oral Daily Karmen Bongo, MD   75 mg at 05/08/20 1019  . enoxaparin (LOVENOX) injection 40 mg  40 mg Subcutaneous Q24H Karmen Bongo, MD   40 mg at 05/07/20 2028  . hydrOXYzine (ATARAX/VISTARIL) tablet 25 mg  25 mg Oral QID PRN Karmen Bongo, MD   25 mg at 05/08/20 0805  . ipratropium-albuterol (DUONEB) 0.5-2.5 (3) MG/3ML nebulizer solution 3 mL  3 mL Nebulization Q6H PRN Karmen Bongo, MD      . irbesartan Levy Sjogren) tablet 300 mg  300 mg Oral Daily Karmen Bongo, MD   300 mg at 05/08/20 1020  . lactated ringers infusion   Intravenous Continuous Karmen Bongo, MD 100 mL/hr at 05/08/20 0512 New Bag at 05/08/20 0512  . latanoprost (XALATAN) 0.005 % ophthalmic solution 1 drop  1 drop Right Eye Ivery Quale, MD   1 drop at 05/07/20 2206  . LORazepam (ATIVAN) tablet 0.5 mg  0.5 mg Oral BID Karmen Bongo, MD   0.5 mg at 05/08/20 1020  . metoprolol tartrate (LOPRESSOR) tablet 25 mg  25 mg Oral BID Karmen Bongo, MD   25 mg at 05/08/20 1019  . mirtazapine (REMERON) tablet 30 mg  30 mg Oral Ivery Quale, MD   30 mg at 05/07/20 2159  . mupirocin ointment (BACTROBAN) 2 % 1 application  1 application Nasal BID Oswald Hillock, MD   1 application at 01/17/58 2205  . ondansetron (ZOFRAN) tablet 4 mg  4 mg Oral Q6H PRN Karmen Bongo, MD       Or  . ondansetron [facility]) injection 4 mg  4 mg Intravenous Q6H PRN Karmen Bongo, MD      . pantoprazole (PROTONIX) injection 40 mg  40 mg Intravenous Lillia Mountain, MD   40 mg at 05/08/20 1019  . polyethylene glycol (MIRALAX / GLYCOLAX) packet 17 g  17 g Oral Daily PRN Karmen Bongo, MD      . sodium chloride flush (NS) 0.9 % injection 3 mL  3 mL Intravenous Q12H Karmen Bongo, MD   3 mL at 05/07/20 2206  . vancomycin (VANCOREADY) IVPB 1250 mg/250 mL  1,250 mg Intravenous Q24H  Lavenia Atlas, RPH 166.7 mL/hr at 05/07/20 2028 1,250 mg at 05/07/20 2028  . venlafaxine XR (EFFEXOR-XR) 24 hr capsule 75 mg  75 mg Oral Q breakfast Karmen Bongo, MD   75 mg at 05/08/20 0805     Discharge Medications: Please see discharge summary for a list of discharge medications.  Relevant Imaging Results:  Relevant Lab Results:   Additional Information SSN: [id number]  Henrick Mcgue, LCSWA

## 2020-05-08 NOTE — TOC Initial Note (Addendum)
Transition of Care [facility]) - Initial/Assessment Note    Patient Details  Name: [name] Duffy MRN: [id number] Date of Birth: 28-Dec-1929  [facility]) CM/SW Contact:    [place], [place] Phone Number: 05/08/2020, 11:04 AM  Clinical Narrative:        CSW received consult for possible SNF placement at time of discharge. CSW spoke with patient's spouse who could not hear CSW. Patients spouse expressed possible alternatives to [facility] as she wants to make sure they are providing her husband with care and asked for CSW to call her back later in the day. Therefore, CSW reached out to patients nephew [name] [phone]) who reported it would be great if [name] could go back to [facility] if they can handle his needs. Patient's nephew expressed understanding of PT recommendation and is agreeable to SNF placement at time of discharge. Patient's nephew reports preference for [facility]. CSW discussed insurance authorization process and informed nephew that CSW will continue to be in touch regarding placement. Patient has received the COVID vaccines. Patient's nephew expressed being hopeful for rehab and for his uncle to feel better soon. No further questions reported at this time.            CSW will continue to follow patient for SNF placement.  05/08/20 11:30am CSW spoke with patients spouse who was very talkative and sometimes tangential and reported preference for SNF placement other than [facility] for her husband if possible. CSW tried to explain that because her husband came from [facility] he would be going back to their facility and that [place] will keep the family updated if that changes on Monday or sometime next week.    Expected Discharge Plan: Skilled Nursing Facility Barriers to Discharge: Continued Medical Work up   Patient Goals and CMS Choice Patient states their goals for this hospitalization and ongoing recovery are:: to go to  SNF   Choice offered to / list presented to : NA  Expected Discharge Plan and Services Expected Discharge Plan: [place] Choice: [place] Living arrangements for the past 2 months: [place]                          Prior Living Arrangements/Services Living arrangements for the past 2 months: [place] Lives with:: Facility Resident Patient language and need for interpreter reviewed:: Yes Do you feel safe going back to the place where you live?: Yes      Need for Family Participation in Patient Care: Yes (Comment) Care giver support system in place?: Yes (comment)   Criminal Activity/Legal Involvement Pertinent to Current Situation/Hospitalization: No - Comment as needed  Activities of Daily Living      Permission Sought/Granted Permission sought to share information with : Case Manager,Family Supports Permission granted to share information with : Yes, Verbal Permission Granted  Share Information with NAME: Family, [name] and [place] granted to share info w AGENCY: SNF's  Permission granted to share info w Relationship: Nephew and Spouse  Permission granted to share info w Contact Information: [name] [phone]  Emotional Assessment         Alcohol / Substance Use: Not Applicable Psych Involvement: No (comment)  Admission diagnosis:  Hiatal hernia [K44.9] Hypoxia [R09.02] Chest pain, pleuritic [R07.81] Sepsis due to undetermined organism [facility]) [A41.9] Patient Active Problem List  Diagnosis Date Noted  . Sepsis due to undetermined organism ([place]) 05/06/2020  . DNR (do not resuscitate) 05/06/2020  . Hypercalcemia 04/08/2020  . Dyspnea on exertion 10/09/2019  . 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 Vascular dementia without behavioral disturbance ([place]) 03/10/2019  . Cerebellar  dysfunction 03/10/2019  . Incontinence of feces with fecal urgency 03/10/2019  . Functional urinary incontinence 03/10/2019  . Frequent falls 03/10/2019  . Weakness of right lower extremity 07/25/2018  . Abnormality of gait 05/30/2018  . Hypoalbuminemia due to protein-calorie malnutrition ([place])   . Essential hypertension   . Glaucoma   . Dyslipidemia   . Left sided lacunar stroke ([place]) 04/10/2018  . Acute CVA (cerebrovascular accident) ([place]) 04/06/2018  . CVA (cerebral vascular accident) ([place]) 04/06/2018  . 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 Open angle with borderline findings, low risk, unspecified eye 10/12/2014  . Primary open angle glaucoma 10/12/2014  . Anxiety state 07/23/2013  . Genuine stress incontinence, male 12/13/2011  . H/O malignant neoplasm of prostate 01/28/2011   PCP:  Gayland Curry, DO Pharmacy:   [facility], [place] [address] [place] [address] Phone: [phone] Fax: [phone]     Social Determinants of Health (SDOH) Interventions    Readmission Risk Interventions No flowsheet data found.

## 2020-05-08 NOTE — Progress Notes (Signed)
PROGRESS NOTE    [name] Duffy  AGT:[id number] DOB: 1929/11/22 DOA: 05/06/2020 PCP: Gayland Curry, DO   Brief Narrative:   Patient is a 87 year old male with a past medical history significant for prostate CA, dementia, HTN, and HLD who presented to the ED with hypoxia.  He lives at [facility] and was last seen at his facility on 1/20.  He is DNR and has vascular dementia with history of CVA and periods of agitation.  He takes Protonix and Pepcid for a paraesophageal hernia.  The patient's wife doesn't have a clear picture as to why he was sent to the hospital but was told he was SOB and "highly agitated".  The admitting provider after having an at length conversation with the wife and nephew were in agreement to patient being a DNR status.  He has had a quick progression of vascular dementia over the past few months with periodic episodes of being unable to communicate.    In the ED, developed a new oxygen requirement as he is on RA at baseline with sats. and in the ED was at 88 % on RA. CXR/CT showed large hiatal hernia.  In the ED was placed on Bipap and later transitioned in [place] O2. Patient is COVID negative.  Patient was initially started on IV vancomycin, cefepime, and Flagyl with Flagyl discontinued on 05/07/20, since CT abdomen/pelvis was unremarkable with low likelihood of of an abdominal source of infection.  On 05/07/20, patient's oxygen requirements went up to 5-6 L [place] with one dose of Lasix 20 mg IV given.  Today, breathing requirements have improved and in now on 2L oxygen [place] and is alert and able to track doctor and nurse with his eyes.   Assessment & Plan:   Principal Problem:   Sepsis due to undetermined organism [facility]) Active Problems:   Essential hypertension   Dyslipidemia   Vascular dementia without behavioral disturbance ([place])   Paraesophageal hernia   DNR (do not resuscitate)   Sepsis, undetermined organism likely due to early PNA -SIRS criteria for sepsis  met including leukocytosis, fever, tachycardia, tachypnea, lactate >2, and encephalopathy/altered mental status -Sepsis protocol initiated -Blood cultures x2 obtained, pending results -Urinalysis and culture ordered, pending results -Possible cause early pneumonia which may not yet show on CXR, but symptoms of tachypnea and hypoxia consistent with possible pneumonia -Lactate elevated to 4.8 and procalcitonin less than 0.10 -WBC's 19.8->20.9->19.0 -Broad spectrum antibiotics started with IVFlagyl/Cefepime/Vanc for undifferentiated sepsis, while pending culture and sensitivity - CT abdomen/pelvis was unremarkable with low likelihood of of an abdominal source of infection, thereby Flagyl was discontinued on 05/07/20 -Pharmacy to dose vancomycin and cefepime -Monitor renal function, blood, and urine cultures.  -On 05/07/20 was called to bedside by report by bedside RN that patient had tremors of hands and legs and decreased oxygen saturation in the upper 80's. Patient appeared to have distended abdomen with generalized distress, with muscle tremors, hiccups, tachypnea, and increased oxygen demands. Patient given Lasix 20 mg IV once with breathing improved. -Today, breathing requirements have improved and in now on 2L oxygen [place] and is alert and able to track doctor and nurse with his eyes.  Vascular dementia with behavioral disturbance with history of CVA -Pr nephew reports fairly rapid progression of late -Continue Plavix, hydroxyzine, Ativan, Remeron, Effexor  HTN -Continue Avapro, lopressor  HLD -Continue Lipitor  H/o hypercalcemia, chronic/stable -Continue Calcitriol  Paraesophageal hernia chronic/stable -Unlikely to be the source of current hypoxia considering elevated lactate levels -  Continue Protonix PPI  Constipation, per CT abdomen/pelvis, potentially related to large hernia and large stool burden with possible impaction -Continue bowel regimen -Continue  cholestyramine -Dulcolax suppository as needed  MRSA infection, nares -Continue mupirocin  DVT prophylaxis: Lovenox Code Status: DNR Family Communication:  Nephew updated by provider by phone  Status is: Inpatient  Dispo: The patient is from: SNF              Anticipated d/c is to: SNF              Anticipated d/c date is: 05/12/20              Patient currently is not stable for discharge   Difficult to place patient: No   Body mass index is 23.09 kg/m.   Consultants:   Pharmacy (dosing for Vancomycin, cefepime)  Procedures:   None  Antimicrobials:   Vancomycin, cefepime   Subjective: Patient seen and examined a bedside.  Alert to self only minimally able to follow commands.  No visible shaking/tremors of hands or legs noted today.  However, patient appears to be tightly gripping his sheets with his right hand.   Review of Systems Unable to obtain full ROS due to dementia and encephalopathy Otherwise negative except as per HPI, including: General: Denies fever, chills, night sweats or unintended weight loss. Resp: Denies cough, wheezing, shortness of breath. Cardiac: Denies chest pain, palpitations, orthopnea, paroxysmal nocturnal dyspnea. GI: Denies abdominal pain, nausea, vomiting, diarrhea or constipation GU: Denies dysuria, frequency, hesitancy or incontinence MS: Denies muscle aches, joint pain or swelling Neuro: Denies headache, neurologic deficits (focal weakness, numbness, tingling), abnormal gait Psych: Denies anxiety, depression, SI/HI/AVH Skin: Denies new rashes or lesions ID: Denies sick contacts, exotic exposures, travel  Examination:  General exam: Appears calm and comfortable on 2L [place].  Respiratory system: Clear to auscultation. Respiratory effort normal. Cardiovascular system: S1 & S2 heard, RR with tachycardia. No JVD, murmurs, rubs, gallops or clicks. No pedal edema. Gastrointestinal system: Abdomen is nondistended, soft and nontender.  No organomegaly or masses felt. Normal bowel sounds heard. Central nervous system: Alert and oriented. No focal neurological deficits. Extremities: Symmetric 5 x 5 power. Unable to follow commands to assess strength. Scattered bruises on arms and legs Skin: No rashes, lesions or ulcers Psychiatry: Judgement and insight appear normal. Mood & affect appropriate.     Objective: Vitals:   05/07/20 1958 05/08/20 0451 05/08/20 0500 05/08/20 0859  BP: 139/87 (!) 167/118 (!) 161/113 (!) 155/79  Pulse: 96 (!) 103  99  Resp: '20 18  20  ' Temp: 97.7 F (36.5 C) 98.7 F (37.1 C)  98.3 F (36.8 C)  TempSrc: Oral   Oral  SpO2: 100% 100%  98%  Weight:      Height:        Intake/Output Summary (Last 24 hours) at 05/08/2020 1135 Last data filed at 05/08/2020 0458 Gross per 24 hour  Intake 760 ml  Output 1950 ml  Net -1190 ml   Filed Weights   05/06/20 1000  Weight: 79.4 kg     Data Reviewed:   CBC: Recent Labs  Lab 05/06/20 0453 05/07/20 0331 05/08/20 0743  WBC 19.8* 20.9* 19.0*  NEUTROABS 15.2*  --   --   HGB 15.8 13.4 14.2  HCT 49.1 40.3 43.7  MCV 88.9 87.0 87.4  PLT 287 228 459   Basic Metabolic Panel: Recent Labs  Lab 05/06/20 0453 05/07/20 0331 05/08/20 0743  NA 139 140 139  K 5.1  4.0 3.6  CL 104 106 106  CO2 '23 23 22  ' GLUCOSE 188* 127* 94  BUN 19 26* 19  CREATININE 1.19 1.07 0.91  CALCIUM 9.9 9.6 9.7   GFR: Estimated Creatinine Clearance: 63 mL/min (by C-G formula based on SCr of 0.91 mg/dL). Liver Function Tests: Recent Labs  Lab 05/06/20 0909  AST 21  ALT 24  ALKPHOS 75  BILITOT 0.9  PROT 6.5  ALBUMIN 3.6   No results for input(s): LIPASE, AMYLASE in the last 168 hours. No results for input(s): AMMONIA in the last 168 hours. Coagulation Profile: No results for input(s): INR, PROTIME in the last 168 hours. Cardiac Enzymes: No results for input(s): CKTOTAL, CKMB, CKMBINDEX, TROPONINI in the last 168 hours. BNP (last 3 results) Recent Labs     07/14/19 0000  PROBNP 66.00   HbA1C: No results for input(s): HGBA1C in the last 72 hours. CBG: Recent Labs  Lab 05/07/20 1152  GLUCAP 99   Lipid Profile: No results for input(s): CHOL, HDL, LDLCALC, TRIG, CHOLHDL, LDLDIRECT in the last 72 hours. Thyroid Function Tests: No results for input(s): TSH, T4TOTAL, FREET4, T3FREE, THYROIDAB in the last 72 hours. Anemia Panel: No results for input(s): VITAMINB12, FOLATE, FERRITIN, TIBC, IRON, RETICCTPCT in the last 72 hours. Sepsis Labs: Recent Labs  Lab 05/06/20 0849 05/06/20 0909 05/06/20 2002  PROCALCITON  --  <0.10  --   LATICACIDVEN 2.8*  --  4.8*    Recent Results (from the past 240 hour(s))  Resp Panel by RT-PCR (Flu A&B, Covid) Nasopharyngeal Swab     Status: None   Collection Time: 05/06/20  5:26 AM   Specimen: Nasopharyngeal Swab; Nasopharyngeal(NP) swabs in vial transport medium  Result Value Ref Range Status   SARS Coronavirus 2 by RT PCR NEGATIVE NEGATIVE Final    Comment: (NOTE) SARS-CoV-2 target nucleic acids are NOT DETECTED.  The SARS-CoV-2 RNA is generally detectable in upper respiratory specimens during the acute phase of infection. The lowest concentration of SARS-CoV-2 viral copies this assay can detect is 138 copies/mL. A negative result does not preclude SARS-Cov-2 infection and should not be used as the sole basis for treatment or other patient management decisions. A negative result may occur with  improper specimen collection/handling, submission of specimen other than nasopharyngeal swab, presence of viral mutation(s) within the areas targeted by this assay, and inadequate number of viral copies(<138 copies/mL). A negative result must be combined with clinical observations, patient history, and epidemiological information. The expected result is Negative.  Fact Sheet for Patients:  EntrepreneurPulse.com.au  Fact Sheet for Healthcare Providers:   IncredibleEmployment.be  This test is no t yet approved or cleared by the [place] FDA and  has been authorized for detection and/or diagnosis of SARS-CoV-2 by FDA under an Emergency Use Authorization (EUA). This EUA will remain  in effect (meaning this test can be used) for the duration of the COVID-19 declaration under Section 564(b)(1) of the Act, 21 U.S.C.section 360bbb-3(b)(1), unless the authorization is terminated  or revoked sooner.       Influenza A by PCR NEGATIVE NEGATIVE Final   Influenza B by PCR NEGATIVE NEGATIVE Final    Comment: (NOTE) The Xpert Xpress SARS-CoV-2/FLU/RSV plus assay is intended as an aid in the diagnosis of influenza from Nasopharyngeal swab specimens and should not be used as a sole basis for treatment. Nasal washings and aspirates are unacceptable for Xpert Xpress SARS-CoV-2/FLU/RSV testing.  Fact Sheet for Patients: EntrepreneurPulse.com.au  Fact Sheet for Healthcare Providers: IncredibleEmployment.be  This test is not yet approved or cleared by the [place] and has been authorized for detection and/or diagnosis of SARS-CoV-2 by FDA under an Emergency Use Authorization (EUA). This EUA will remain in effect (meaning this test can be used) for the duration of the COVID-19 declaration under Section 564(b)(1) of the Act, 21 U.S.C. section 360bbb-3(b)(1), unless the authorization is terminated or revoked.  Performed at [facility], [place] [address], [place], [place] [address]   Blood Culture (routine x 2)     Status: None (Preliminary result)   Collection Time: 05/06/20  8:45 AM   Specimen: BLOOD RIGHT HAND  Result Value Ref Range Status   Specimen Description BLOOD RIGHT HAND  Final   Special Requests   Final    BOTTLES DRAWN AEROBIC AND ANAEROBIC Blood Culture results may not be optimal due to an inadequate volume of blood received in culture bottles   Culture   Final     NO GROWTH 2 DAYS Performed at [facility], [place] [address], [place], [place] [address]    Report Status PENDING  Incomplete  Blood Culture (routine x 2)     Status: None (Preliminary result)   Collection Time: 05/06/20  8:50 AM   Specimen: BLOOD RIGHT ARM  Result Value Ref Range Status   Specimen Description BLOOD RIGHT ARM  Final   Special Requests   Final    BOTTLES DRAWN AEROBIC AND ANAEROBIC Blood Culture adequate volume   Culture   Final    NO GROWTH 2 DAYS Performed at [facility], [place] [address] [place], [place] [address]    Report Status PENDING  Incomplete  MRSA PCR Screening     Status: Abnormal   Collection Time: 05/07/20 11:30 AM   Specimen: Nasopharyngeal  Result Value Ref Range Status   MRSA by PCR POSITIVE (A) NEGATIVE Final    Comment:        The GeneXpert MRSA Assay (FDA approved for NASAL specimens only), is one component of a comprehensive MRSA colonization surveillance program. It is not intended to diagnose MRSA infection nor to guide or monitor treatment for MRSA infections. RESULT CALLED TO, READ BACK BY AND VERIFIED WITH: RN Suella Broad [id number] AT 1344 BY CM Performed at [facility], [place] [address] [place], [place] [address]          Radiology Studies: No results found.    Scheduled Meds: . atorvastatin  20 mg Oral q1800  . calcitRIOL  0.25 mcg Oral Daily  . Chlorhexidine Gluconate Cloth  6 each Topical Q0600  . cholestyramine  4 g Oral BID  . clopidogrel  75 mg Oral Daily  . enoxaparin (LOVENOX) injection  40 mg Subcutaneous Q24H  . irbesartan  300 mg Oral Daily  . latanoprost  1 drop Right Eye QHS  . LORazepam  0.5 mg Oral BID  . metoprolol tartrate  25 mg Oral BID  . mirtazapine  30 mg Oral QHS  . mupirocin ointment  1 application Nasal BID  . pantoprazole (PROTONIX) IV  40 mg Intravenous Q12H  . sodium chloride flush  3 mL Intravenous Q12H  . venlafaxine XR  75 mg Oral Q breakfast   Continuous  Infusions: . ceFEPime (MAXIPIME) IV    . lactated ringers 100 mL/hr at 05/08/20 0512  . vancomycin 1,250 mg (05/07/20 2028)     LOS: 2 days   Time spent= 35 mins   Dede Query, RN,  NP-Student   If 7PM-7AM, please contact night-coverage  05/08/2020, 11:35 AM

## 2020-05-08 NOTE — Progress Notes (Signed)
Pharmacy Antibiotic Note  [name] Duffy is a 87 y.o. male admitted on 05/06/2020 with respiratory distress - started on BiPAP.  Pharmacy has been consulted for vancomycin and cefepime dosing for sepsis - source unknown, possible PNA.  SCr has improved, MRSA swab positive, other cultures unrevealing.  Plan: -Vancomycin 1250mg  IV q24h (eAUC 490 using Scr 1.19) -Increase cefepime to 2g IV q8h -Metronidazole per MD  -Monitor renal function, cultures, infectious work up, and clinical progression   Height: 6\' 1"  (185.4 cm) Weight: 79.4 kg (175 lb) IBW/kg (Calculated) : 79.9  Temp (24hrs), Avg:98.5 F (36.9 C), Min:97.7 F (36.5 C), Max:99.1 F (37.3 C)  Recent Labs  Lab 05/06/20 0453 05/06/20 0849 05/06/20 2002 05/07/20 0331 05/08/20 0743  WBC 19.8*  --   --  20.9* 19.0*  CREATININE 1.19  --   --  1.07 0.91  LATICACIDVEN  --  2.8* 4.8*  --   --     Estimated Creatinine Clearance: 63 mL/min (by C-G formula based on SCr of 0.91 mg/dL).    Allergies  Allergen Reactions  . Rofecoxib Swelling    REACTION: swelling- Vioxx name brand     Antimicrobials this admission: Vancomycin 2/17 >>  Cefepime 2/17 >>  Metronidazole 2/17 >>   Dose adjustments this admission: N/a  Microbiology results: 2/17 BCx: NGTD 2/17 UCx: pending 2/18 MRSA PCR: positive  Thank you for allowing pharmacy to be a part of this patient's care.  Arrie Senate, PharmD, BCPS, [facility] Clinical Pharmacist Please check AMION for all [place] numbers 05/08/2020

## 2020-05-08 NOTE — Progress Notes (Signed)
   05/08/20 0500  Vitals  BP (!) 161/113   Textpage on call provider MD Zierle-Ghosh made aware about pt.'s BP. see new order. Will continue to monitor patient

## 2020-05-09 DIAGNOSIS — A419 Sepsis, unspecified organism: Secondary | ICD-10-CM | POA: Diagnosis not present

## 2020-05-09 DIAGNOSIS — R140 Abdominal distension (gaseous): Secondary | ICD-10-CM | POA: Diagnosis not present

## 2020-05-09 LAB — CBC
HCT: 40 % (ref 39.0–52.0)
Hemoglobin: 13.5 g/dL (ref 13.0–17.0)
MCH: 29.3 pg (ref 26.0–34.0)
MCHC: 33.8 g/dL (ref 30.0–36.0)
MCV: 87 fL (ref 80.0–100.0)
Platelets: 200 10*3/uL (ref 150–400)
RBC: 4.6 MIL/uL (ref 4.22–5.81)
RDW: 13.1 % (ref 11.5–15.5)
WBC: 12.7 10*3/uL — ABNORMAL HIGH (ref 4.0–10.5)
nRBC: 0 % (ref 0.0–0.2)

## 2020-05-09 LAB — BASIC METABOLIC PANEL
Anion gap: 13 (ref 5–15)
BUN: 18 mg/dL (ref 8–23)
CO2: 24 mmol/L (ref 22–32)
Calcium: 9.6 mg/dL (ref 8.9–10.3)
Chloride: 102 mmol/L (ref 98–111)
Creatinine, Ser: 0.94 mg/dL (ref 0.61–1.24)
GFR, Estimated: >60 mL/min (ref >60)
Glucose, Bld: 101 mg/dL — ABNORMAL HIGH (ref 70–99)
Potassium: 3.6 mmol/L (ref 3.5–5.1)
Sodium: 139 mmol/L (ref 135–145)

## 2020-05-09 NOTE — Progress Notes (Signed)
Patient awake, eyes open following me around. When being instructed to take a sip from straw, unable to follow command.

## 2020-05-09 NOTE — Progress Notes (Signed)
[facility]  PROGRESS NOTE  [name] Duffy:[id number] DOB: 07-Oct-1929 DOA: 05/06/2020 PCP: Gayland Curry, DO   Brief HPI:    87 year old male with past medical history of prostate cancer, dementia, hypertension, hyperlipidemia presented to ED with hypoxemia.  Patient lives at [facility] skilled nursing facility.  He has vascular dementia and history of CVA.  Patient dementia has progressed over past few months, as patient has not been able to communicate well as per nephew.  In the ED he was found to have new oxygen requirement, chest x-ray/CT abdomen pelvis showed large hiatal hernia. In the ED, patient was placed on BiPAP.  Patient has been started on IV vancomycin, cefepime.  Will discontinue Flagyl, as low r probability for abdominal source of infection.  CT abdomen/pelvis is unremarkable.  Lactic acid was elevated 4.8, procalcitonin less than 0.10.      Subjective   Patient seen and examined, is more lethargic today. Barely opens eyes to verbal stimuli. WBC is down to 12,000.   Assessment/Plan:     1. Sepsis-likely from underlying pneumonia, patient presented with leukocytosis, fever, tachycardia, tachypnea. Lactate more than 2. Blood cultures x2 are negative to date. Sepsis physiology has resolved. Patient started on vancomycin and cefepime. Flagyl was discontinued. Lactate elevated 4.8, procalcitonin less than 0.10. Patient still requiring 3 L/min of oxygen. 2. Vascular dementia-patient has history of CVA, has intermittent behavioral disturbance. As per nephew patient dementia has worsened over the past few weeks. 3. Hypertension-continue Avapro, Lopressor 4. Hematuria-renal stone-CT scan shows bilateral renal stones largest 11 mm in the right renal pelvis.Hemoglobin is stable. 5. Goals of care discussion-discussed with patient's nephew, patient has not eaten since he came to the hospital. Patient's nephew does not want any heroic measures no  artificial nutrition with feeding tube. No PEG tube placement. If patient does not improve in next 24 to 48 hours will consider residential hospice placement. Nephew is in agreement.     COVID-19 Labs  No results for input(s): DDIMER, FERRITIN, LDH, CRP in the last 72 hours.  Lab Results  Component Value Date   [place] 05/06/2020   [place] Not Detected  12/27/2018   [place] Not Detected 12/18/2018     Scheduled medications:   . atorvastatin  20 mg Oral q1800  . calcitRIOL  0.25 mcg Oral Daily  . Chlorhexidine Gluconate Cloth  6 each Topical Q0600  . cholestyramine  4 g Oral BID  . clopidogrel  75 mg Oral Daily  . enoxaparin (LOVENOX) injection  40 mg Subcutaneous Q24H  . irbesartan  300 mg Oral Daily  . latanoprost  1 drop Right Eye QHS  . LORazepam  0.5 mg Oral BID  . metoprolol tartrate  25 mg Oral BID  . mirtazapine  30 mg Oral QHS  . mupirocin ointment  1 application Nasal BID  . pantoprazole (PROTONIX) IV  40 mg Intravenous Q12H  . sodium chloride flush  3 mL Intravenous Q12H  . venlafaxine XR  75 mg Oral Q breakfast         CBG: Recent Labs  Lab 05/07/20 1152  GLUCAP 99    SpO2: 95 % O2 Flow Rate (L/min): 3 L/min FiO2 (%): 40 %    CBC: Recent Labs  Lab 05/06/20 0453 05/07/20 0331 05/08/20 0743 05/09/20 0612  WBC 19.8* 20.9* 19.0* 12.7*  NEUTROABS 15.2*  --   --   --   HGB 15.8 13.4 14.2 13.5  HCT 49.1 40.3 43.7 40.0  MCV 88.9  87.0 87.4 87.0  PLT 287 228 215 101    Basic Metabolic Panel: Recent Labs  Lab 05/06/20 0453 05/07/20 0331 05/08/20 0743 05/09/20 0612  NA 139 140 139 139  K 5.1 4.0 3.6 3.6  CL 104 106 106 102  CO2 23 23 22 24   GLUCOSE 188* 127* 94 101*  BUN 19 26* 19 18  CREATININE 1.19 1.07 0.91 0.94  CALCIUM 9.9 9.6 9.7 9.6     Liver Function Tests: Recent Labs  Lab 05/06/20 0909  AST 21  ALT 24  ALKPHOS 75  BILITOT 0.9  PROT 6.5  ALBUMIN 3.6     Antibiotics: Anti-infectives (From  admission, onward)   Start     Dose/Rate Route Frequency Ordered Stop   05/08/20 1400  ceFEPIme (MAXIPIME) 2 g in sodium chloride 0.9 % 100 mL IVPB        2 g 200 mL/hr over 30 Minutes Intravenous Every 8 hours 05/08/20 0942     05/07/20 2000  vancomycin (VANCOREADY) IVPB 1250 mg/250 mL        1,250 mg 166.7 mL/hr over 90 Minutes Intravenous Every 24 hours 05/06/20 1828     05/07/20 1030  vancomycin (VANCOREADY) IVPB 1250 mg/250 mL  Status:  Discontinued        1,250 mg 166.7 mL/hr over 90 Minutes Intravenous Every 24 hours 05/06/20 1013 05/06/20 1828   05/06/20 2200  ceFEPIme (MAXIPIME) 2 g in sodium chloride 0.9 % 100 mL IVPB  Status:  Discontinued        2 g 200 mL/hr over 30 Minutes Intravenous Every 12 hours 05/06/20 1012 05/08/20 0942   05/06/20 1930  vancomycin (VANCOREADY) IVPB 1750 mg/350 mL        1,750 mg 175 mL/hr over 120 Minutes Intravenous  Once 05/06/20 1839 05/06/20 2227   05/06/20 1900  vancomycin (VANCOREADY) IVPB 1750 mg/350 mL  Status:  Discontinued        1,750 mg 175 mL/hr over 120 Minutes Intravenous  Once 05/06/20 1828 05/06/20 1839   05/06/20 1000  ceFEPIme (MAXIPIME) 2 g in sodium chloride 0.9 % 100 mL IVPB        2 g 200 mL/hr over 30 Minutes Intravenous  Once 05/06/20 0959 05/06/20 1205   05/06/20 1000  metroNIDAZOLE (FLAGYL) IVPB 500 mg  Status:  Discontinued        500 mg 100 mL/hr over 60 Minutes Intravenous Every 8 hours 05/06/20 0959 05/08/20 1019   05/06/20 1000  vancomycin (VANCOREADY) IVPB 1750 mg/350 mL  Status:  Discontinued        1,750 mg 175 mL/hr over 120 Minutes Intravenous  Once 05/06/20 0959 05/06/20 1828   05/06/20 0700  piperacillin-tazobactam (ZOSYN) IVPB 3.375 g        3.375 g 100 mL/hr over 30 Minutes Intravenous  Once 05/06/20 0656 05/06/20 1018       DVT prophylaxis: Lovenox  Code Status: DNR  Family Communication: Discussed with nephew at bedside   Consultants:  Procedures:      Objective   Vitals:   05/08/20  1609 05/08/20 2055 05/09/20 0455 05/09/20 0916  BP: (!) 141/89 (!) 123/92 (!) 165/99 (!) 121/92  Pulse: 84 90 88 92  Resp: (!) 21 20 20  (!) 22  Temp: 97.8 F (36.6 C) 98.2 F (36.8 C) 98.3 F (36.8 C) 97.8 F (36.6 C)  TempSrc: Oral   Oral  SpO2: 100% 100% 95% 95%  Weight:      Height:  Intake/Output Summary (Last 24 hours) at 05/09/2020 1303 Last data filed at 05/09/2020 0200 Gross per 24 hour  Intake 2672.01 ml  Output 400 ml  Net 2272.01 ml    02/18 1901 - 02/20 0700 In: 3372 [P.O.:460; I.V.:1765.2] Out: 1000 [Urine:1000]  Filed Weights   05/06/20 1000  Weight: 79.4 kg    Physical Examination:   General-lethargic Heart-S1-S2, regular, no murmur auscultated Lungs-clear to auscultation bilaterally, no wheezing or crackles auscultated Abdomen-soft, nontender, no organomegaly Extremities-no edema in the lower extremities Neuro-somnolent, barely opens eyes to verbal stimuli  Status is: Inpatient  Dispo: The patient is from: Skilled nursing facility              Anticipated d/c is to: Skilled nursing facility versus residential hospice              Anticipated d/c date is: 05/12/2020              Patient currently not stable for discharge  Barrier to discharge-ongoing treatment for pneumonia            Data Reviewed:   Recent Results (from the past 240 hour(s))  Resp Panel by RT-PCR (Flu A&B, Covid) Nasopharyngeal Swab     Status: None   Collection Time: 05/06/20  5:26 AM   Specimen: Nasopharyngeal Swab; Nasopharyngeal(NP) swabs in vial transport medium  Result Value Ref Range Status   SARS Coronavirus 2 by RT PCR NEGATIVE NEGATIVE Final    Comment: (NOTE) SARS-CoV-2 target nucleic acids are NOT DETECTED.  The SARS-CoV-2 RNA is generally detectable in upper respiratory specimens during the acute phase of infection. The lowest concentration of SARS-CoV-2 viral copies this assay can detect is 138 copies/mL. A negative result does not preclude  SARS-Cov-2 infection and should not be used as the sole basis for treatment or other patient management decisions. A negative result may occur with  improper specimen collection/handling, submission of specimen other than nasopharyngeal swab, presence of viral mutation(s) within the areas targeted by this assay, and inadequate number of viral copies(<138 copies/mL). A negative result must be combined with clinical observations, patient history, and epidemiological information. The expected result is Negative.  Fact Sheet for Patients:  EntrepreneurPulse.com.au  Fact Sheet for Healthcare Providers:  IncredibleEmployment.be  This test is no t yet approved or cleared by the [place] FDA and  has been authorized for detection and/or diagnosis of SARS-CoV-2 by FDA under an Emergency Use Authorization (EUA). This EUA will remain  in effect (meaning this test can be used) for the duration of the COVID-19 declaration under Section 564(b)(1) of the Act, 21 U.S.C.section 360bbb-3(b)(1), unless the authorization is terminated  or revoked sooner.       Influenza A by PCR NEGATIVE NEGATIVE Final   Influenza B by PCR NEGATIVE NEGATIVE Final    Comment: (NOTE) The Xpert Xpress SARS-CoV-2/FLU/RSV plus assay is intended as an aid in the diagnosis of influenza from Nasopharyngeal swab specimens and should not be used as a sole basis for treatment. Nasal washings and aspirates are unacceptable for Xpert Xpress SARS-CoV-2/FLU/RSV testing.  Fact Sheet for Patients: EntrepreneurPulse.com.au  Fact Sheet for Healthcare Providers: IncredibleEmployment.be  This test is not yet approved or cleared by the [place] FDA and has been authorized for detection and/or diagnosis of SARS-CoV-2 by FDA under an Emergency Use Authorization (EUA). This EUA will remain in effect (meaning this test can be used) for the duration of  the COVID-19 declaration under Section 564(b)(1) of the Act, 21 U.S.C.  section 360bbb-3(b)(1), unless the authorization is terminated or revoked.  Performed at [facility], [place] [address], [place], [place] [address]   Blood Culture (routine x 2)     Status: None (Preliminary result)   Collection Time: 05/06/20  8:45 AM   Specimen: BLOOD RIGHT HAND  Result Value Ref Range Status   Specimen Description BLOOD RIGHT HAND  Final   Special Requests   Final    BOTTLES DRAWN AEROBIC AND ANAEROBIC Blood Culture results may not be optimal due to an inadequate volume of blood received in culture bottles   Culture   Final    NO GROWTH 3 DAYS Performed at [facility], [place] [address], [place], [place] [address]    Report Status PENDING  Incomplete  Blood Culture (routine x 2)     Status: None (Preliminary result)   Collection Time: 05/06/20  8:50 AM   Specimen: BLOOD RIGHT ARM  Result Value Ref Range Status   Specimen Description BLOOD RIGHT ARM  Final   Special Requests   Final    BOTTLES DRAWN AEROBIC AND ANAEROBIC Blood Culture adequate volume   Culture   Final    NO GROWTH 3 DAYS Performed at [facility], [place] [address], [place], [place] [address]    Report Status PENDING  Incomplete  Urine culture     Status: None   Collection Time: 05/06/20  3:54 PM   Specimen: In/Out Cath Urine  Result Value Ref Range Status   Specimen Description IN/OUT CATH URINE  Final   Special Requests NONE  Final   Culture   Final    NO GROWTH Performed at [facility], [place] [address] [place], [place] [address]    Report Status 05/08/2020 FINAL  Final  MRSA PCR Screening     Status: Abnormal   Collection Time: 05/07/20 11:30 AM   Specimen: Nasopharyngeal  Result Value Ref Range Status   MRSA by PCR POSITIVE (A) NEGATIVE Final    Comment:        The GeneXpert MRSA Assay (FDA approved for NASAL specimens only), is one component of a comprehensive MRSA  colonization surveillance program. It is not intended to diagnose MRSA infection nor to guide or monitor treatment for MRSA infections. RESULT CALLED TO, READ BACK BY AND VERIFIED WITH: RN Suella Broad [id number] AT 1344 BY CM Performed at [facility], [place] [address] [place], [place] [address]     No results for input(s): LIPASE, AMYLASE in the last 168 hours. No results for input(s): AMMONIA in the last 168 hours.  Cardiac Enzymes: No results for input(s): CKTOTAL, CKMB, CKMBINDEX, TROPONINI in the last 168 hours. BNP (last 3 results) Recent Labs    05/06/20 0453  BNP 426.0*    ProBNP (last 3 results) Recent Labs    07/14/19 0000  PROBNP 66.00    Studies:  No results found.     Oswald Hillock   [facility] If 7PM-7AM, please contact night-coverage at [email or web address], Office  [phone]   05/09/2020, 1:03 PM  LOS: 3 days

## 2020-05-09 NOTE — Progress Notes (Signed)
Patient is still sleeping since this am. Pt is not alert enough to eat or take medications.    Lillith Mcneff, RN

## 2020-05-10 ENCOUNTER — Encounter: Payer: Self-pay | Admitting: Internal Medicine

## 2020-05-10 DIAGNOSIS — A419 Sepsis, unspecified organism: Secondary | ICD-10-CM | POA: Diagnosis not present

## 2020-05-10 DIAGNOSIS — R140 Abdominal distension (gaseous): Secondary | ICD-10-CM | POA: Diagnosis not present

## 2020-05-10 LAB — BASIC METABOLIC PANEL
Anion gap: 11 (ref 5–15)
BUN: 16 mg/dL (ref 8–23)
CO2: 24 mmol/L (ref 22–32)
Calcium: 9.3 mg/dL (ref 8.9–10.3)
Chloride: 104 mmol/L (ref 98–111)
Creatinine, Ser: 0.88 mg/dL (ref 0.61–1.24)
GFR, Estimated: >60 mL/min (ref >60)
Glucose, Bld: 73 mg/dL (ref 70–99)
Potassium: 3.2 mmol/L — ABNORMAL LOW (ref 3.5–5.1)
Sodium: 139 mmol/L (ref 135–145)

## 2020-05-10 LAB — CBC
HCT: 41.8 % (ref 39.0–52.0)
Hemoglobin: 13.6 g/dL (ref 13.0–17.0)
MCH: 28.8 pg (ref 26.0–34.0)
MCHC: 32.5 g/dL (ref 30.0–36.0)
MCV: 88.4 fL (ref 80.0–100.0)
Platelets: 222 10*3/uL (ref 150–400)
RBC: 4.73 MIL/uL (ref 4.22–5.81)
RDW: 12.5 % (ref 11.5–15.5)
WBC: 12 10*3/uL — ABNORMAL HIGH (ref 4.0–10.5)
nRBC: 0 % (ref 0.0–0.2)

## 2020-05-10 MED ORDER — POTASSIUM CHLORIDE 10 MEQ/100ML IV SOLN
10.0000 meq | INTRAVENOUS | Status: AC
  Administered 2020-05-10 (×2): 10 meq via INTRAVENOUS
  Filled 2020-05-10 (×2): qty 100

## 2020-05-10 NOTE — Progress Notes (Signed)
[facility]  PROGRESS NOTE  [name] Duffy LTJ:[id number] DOB: 02-04-1930 DOA: 05/06/2020 PCP: Gayland Curry, DO   Brief HPI:    87 year old male with past medical history of prostate cancer, dementia, hypertension, hyperlipidemia presented to ED with hypoxemia.  Patient lives at [facility] skilled nursing facility.  He has vascular dementia and history of CVA.  Patient dementia has progressed over past few months, as patient has not been able to communicate well as per nephew.  In the ED he was found to have new oxygen requirement, chest x-ray/CT abdomen pelvis showed large hiatal hernia. In the ED, patient was placed on BiPAP.  Patient has been started on IV vancomycin, cefepime.  Will discontinue Flagyl, as low r probability for abdominal source of infection.  CT abdomen/pelvis is unremarkable.  Lactic acid was elevated 4.8, procalcitonin less than 0.10.      Subjective   Patient seen and examined, he is more alert today.  He ate his breakfast.  Tries to communicate.   Assessment/Plan:     1. Sepsis-likely from underlying pneumonia, patient presented with leukocytosis, fever, tachycardia, tachypnea. Lactate more than 2. Blood cultures x2 are negative to date. Sepsis physiology has resolved. Patient started on vancomycin and cefepime. Flagyl was discontinued. Lactate elevated 4.8, procalcitonin less than 0.10. Patient still requiring 2 L/min of oxygen.  We will treat for total 5 days with IV antibiotics 2. Vascular dementia-patient has history of CVA, has intermittent behavioral disturbance. As per nephew patient dementia has worsened over the past few weeks.  Patient mental status seems to have improved, likely at baseline. 3. Hypertension-continue Avapro, Lopressor 4. Hematuria-renal stone-CT scan shows bilateral renal stones largest 11 mm in the right renal pelvis.Hemoglobin is stable.  Patient is asymptomatic.  No pain.  Discussed with patient's nephew.  No  further intervention at this time. 5. Hypokalemia-potassium was 3.2, replace potassium and follow BMP in am. 6. Goals of care discussion-discussed with patient's nephew, patient has not eaten since he came to the hospital. Patient's nephew does not want any heroic measures no artificial nutrition with feeding tube. No PEG tube placement.  We will plan to send patient back to the skilled nursing facility.  If patient condition changes and he becomes clinically worse than plan for residential hospice.     COVID-19 Labs  No results for input(s): DDIMER, FERRITIN, LDH, CRP in the last 72 hours.  Lab Results  Component Value Date   [place] 05/06/2020   [place] Not Detected  12/27/2018   [place] Not Detected 12/18/2018     Scheduled medications:   . atorvastatin  20 mg Oral q1800  . calcitRIOL  0.25 mcg Oral Daily  . Chlorhexidine Gluconate Cloth  6 each Topical Q0600  . cholestyramine  4 g Oral BID  . clopidogrel  75 mg Oral Daily  . enoxaparin (LOVENOX) injection  40 mg Subcutaneous Q24H  . irbesartan  300 mg Oral Daily  . latanoprost  1 drop Right Eye QHS  . LORazepam  0.5 mg Oral BID  . metoprolol tartrate  25 mg Oral BID  . mirtazapine  30 mg Oral QHS  . mupirocin ointment  1 application Nasal BID  . pantoprazole (PROTONIX) IV  40 mg Intravenous Q12H  . sodium chloride flush  3 mL Intravenous Q12H  . venlafaxine XR  75 mg Oral Q breakfast         CBG: Recent Labs  Lab 05/07/20 1152  GLUCAP 99    SpO2: 99 %  O2 Flow Rate (L/min): 2 L/min FiO2 (%): 40 %    CBC: Recent Labs  Lab 05/06/20 0453 05/07/20 0331 05/08/20 0743 05/09/20 0612 05/10/20 0308  WBC 19.8* 20.9* 19.0* 12.7* 12.0*  NEUTROABS 15.2*  --   --   --   --   HGB 15.8 13.4 14.2 13.5 13.6  HCT 49.1 40.3 43.7 40.0 41.8  MCV 88.9 87.0 87.4 87.0 88.4  PLT 287 228 215 200 924    Basic Metabolic Panel: Recent Labs  Lab 05/06/20 0453 05/07/20 0331 05/08/20 0743  05/09/20 0612 05/10/20 0308  NA 139 140 139 139 139  K 5.1 4.0 3.6 3.6 3.2*  CL 104 106 106 102 104  CO2 23 23 22 24 24   GLUCOSE 188* 127* 94 101* 73  BUN 19 26* 19 18 16   CREATININE 1.19 1.07 0.91 0.94 0.88  CALCIUM 9.9 9.6 9.7 9.6 9.3     Liver Function Tests: Recent Labs  Lab 05/06/20 0909  AST 21  ALT 24  ALKPHOS 75  BILITOT 0.9  PROT 6.5  ALBUMIN 3.6     Antibiotics: Anti-infectives (From admission, onward)   Start     Dose/Rate Route Frequency Ordered Stop   05/08/20 1400  ceFEPIme (MAXIPIME) 2 g in sodium chloride 0.9 % 100 mL IVPB        2 g 200 mL/hr over 30 Minutes Intravenous Every 8 hours 05/08/20 0942 05/10/20 2359   05/07/20 2000  vancomycin (VANCOREADY) IVPB 1250 mg/250 mL        1,250 mg 166.7 mL/hr over 90 Minutes Intravenous Every 24 hours 05/06/20 1828 05/10/20 2359   05/07/20 1030  vancomycin (VANCOREADY) IVPB 1250 mg/250 mL  Status:  Discontinued        1,250 mg 166.7 mL/hr over 90 Minutes Intravenous Every 24 hours 05/06/20 1013 05/06/20 1828   05/06/20 2200  ceFEPIme (MAXIPIME) 2 g in sodium chloride 0.9 % 100 mL IVPB  Status:  Discontinued        2 g 200 mL/hr over 30 Minutes Intravenous Every 12 hours 05/06/20 1012 05/08/20 0942   05/06/20 1930  vancomycin (VANCOREADY) IVPB 1750 mg/350 mL        1,750 mg 175 mL/hr over 120 Minutes Intravenous  Once 05/06/20 1839 05/06/20 2227   05/06/20 1900  vancomycin (VANCOREADY) IVPB 1750 mg/350 mL  Status:  Discontinued        1,750 mg 175 mL/hr over 120 Minutes Intravenous  Once 05/06/20 1828 05/06/20 1839   05/06/20 1000  ceFEPIme (MAXIPIME) 2 g in sodium chloride 0.9 % 100 mL IVPB        2 g 200 mL/hr over 30 Minutes Intravenous  Once 05/06/20 0959 05/06/20 1205   05/06/20 1000  metroNIDAZOLE (FLAGYL) IVPB 500 mg  Status:  Discontinued        500 mg 100 mL/hr over 60 Minutes Intravenous Every 8 hours 05/06/20 0959 05/08/20 1019   05/06/20 1000  vancomycin (VANCOREADY) IVPB 1750 mg/350 mL  Status:   Discontinued        1,750 mg 175 mL/hr over 120 Minutes Intravenous  Once 05/06/20 0959 05/06/20 1828   05/06/20 0700  piperacillin-tazobactam (ZOSYN) IVPB 3.375 g        3.375 g 100 mL/hr over 30 Minutes Intravenous  Once 05/06/20 0656 05/06/20 1018       DVT prophylaxis: Lovenox  Code Status: DNR  Family Communication: Discussed with nephew at bedside   Consultants:  Procedures:  Objective   Vitals:   05/09/20 0916 05/09/20 2053 05/10/20 0503 05/10/20 1006  BP: (!) 121/92 (!) 156/97 (!) 159/96 (!) 163/103  Pulse: 92 100 (!) 109 100  Resp: (!) 22 (!) 22 20 20   Temp: 97.8 F (36.6 C) 98.2 F (36.8 C) 98.6 F (37 C) 98.2 F (36.8 C)  TempSrc: Oral     SpO2: 95% 98% 97% 99%  Weight:      Height:        Intake/Output Summary (Last 24 hours) at 05/10/2020 1118 Last data filed at 05/10/2020 0839 Gross per 24 hour  Intake 3548.17 ml  Output 1550 ml  Net 1998.17 ml    02/19 1901 - 02/21 0700 In: 4982.3 [P.O.:330; I.V.:3402.3] Out: 1950 [Urine:1950]  Filed Weights   05/06/20 1000  Weight: 79.4 kg    Physical Examination:   General-appears in no acute distress Heart-S1-S2, regular, no murmur auscultated Lungs-clear to auscultation bilaterally, no wheezing or crackles auscultated Abdomen-soft, nontender, no organomegaly Extremities-no edema in the lower extremities Neuro-alert, not oriented at baseline, tries to communicate.  Moving all extremities  Status is: Inpatient  Dispo: The patient is from: Skilled nursing facility              Anticipated d/c is to: Skilled nursing facility               Anticipated d/c date is: 05/11/2020              Patient currently not stable for discharge  Barrier to discharge-ongoing treatment for pneumonia      Data Reviewed:   Recent Results (from the past 240 hour(s))  Resp Panel by RT-PCR (Flu A&B, Covid) Nasopharyngeal Swab     Status: None   Collection Time: 05/06/20  5:26 AM   Specimen:  Nasopharyngeal Swab; Nasopharyngeal(NP) swabs in vial transport medium  Result Value Ref Range Status   SARS Coronavirus 2 by RT PCR NEGATIVE NEGATIVE Final    Comment: (NOTE) SARS-CoV-2 target nucleic acids are NOT DETECTED.  The SARS-CoV-2 RNA is generally detectable in upper respiratory specimens during the acute phase of infection. The lowest concentration of SARS-CoV-2 viral copies this assay can detect is 138 copies/mL. A negative result does not preclude SARS-Cov-2 infection and should not be used as the sole basis for treatment or other patient management decisions. A negative result may occur with  improper specimen collection/handling, submission of specimen other than nasopharyngeal swab, presence of viral mutation(s) within the areas targeted by this assay, and inadequate number of viral copies(<138 copies/mL). A negative result must be combined with clinical observations, patient history, and epidemiological information. The expected result is Negative.  Fact Sheet for Patients:  EntrepreneurPulse.com.au  Fact Sheet for Healthcare Providers:  IncredibleEmployment.be  This test is no t yet approved or cleared by the [place] FDA and  has been authorized for detection and/or diagnosis of SARS-CoV-2 by FDA under an Emergency Use Authorization (EUA). This EUA will remain  in effect (meaning this test can be used) for the duration of the COVID-19 declaration under Section 564(b)(1) of the Act, 21 U.S.C.section 360bbb-3(b)(1), unless the authorization is terminated  or revoked sooner.       Influenza A by PCR NEGATIVE NEGATIVE Final   Influenza B by PCR NEGATIVE NEGATIVE Final    Comment: (NOTE) The Xpert Xpress SARS-CoV-2/FLU/RSV plus assay is intended as an aid in the diagnosis of influenza from Nasopharyngeal swab specimens and should not be used as a sole basis for  treatment. Nasal washings and aspirates are unacceptable for  Xpert Xpress SARS-CoV-2/FLU/RSV testing.  Fact Sheet for Patients: EntrepreneurPulse.com.au  Fact Sheet for Healthcare Providers: IncredibleEmployment.be  This test is not yet approved or cleared by the [place] FDA and has been authorized for detection and/or diagnosis of SARS-CoV-2 by FDA under an Emergency Use Authorization (EUA). This EUA will remain in effect (meaning this test can be used) for the duration of the COVID-19 declaration under Section 564(b)(1) of the Act, 21 U.S.C. section 360bbb-3(b)(1), unless the authorization is terminated or revoked.  Performed at [facility], [place] [address] [place], [place] [address]   Blood Culture (routine x 2)     Status: None (Preliminary result)   Collection Time: 05/06/20  8:45 AM   Specimen: BLOOD RIGHT HAND  Result Value Ref Range Status   Specimen Description BLOOD RIGHT HAND  Final   Special Requests   Final    BOTTLES DRAWN AEROBIC AND ANAEROBIC Blood Culture results may not be optimal due to an inadequate volume of blood received in culture bottles   Culture   Final    NO GROWTH 4 DAYS Performed at [facility], [place] [address], [place], [place] [address]    Report Status PENDING  Incomplete  Blood Culture (routine x 2)     Status: None (Preliminary result)   Collection Time: 05/06/20  8:50 AM   Specimen: BLOOD RIGHT ARM  Result Value Ref Range Status   Specimen Description BLOOD RIGHT ARM  Final   Special Requests   Final    BOTTLES DRAWN AEROBIC AND ANAEROBIC Blood Culture adequate volume   Culture   Final    NO GROWTH 4 DAYS Performed at [facility], [place] [address] [place], [place] [address]    Report Status PENDING  Incomplete  Urine culture     Status: None   Collection Time: 05/06/20  3:54 PM   Specimen: In/Out Cath Urine  Result Value Ref Range Status   Specimen Description IN/OUT CATH URINE  Final   Special Requests NONE  Final   Culture   Final     NO GROWTH Performed at [facility], [place] [address], [place], [place] [address]    Report Status 05/08/2020 FINAL  Final  MRSA PCR Screening     Status: Abnormal   Collection Time: 05/07/20 11:30 AM   Specimen: Nasopharyngeal  Result Value Ref Range Status   MRSA by PCR POSITIVE (A) NEGATIVE Final    Comment:        The GeneXpert MRSA Assay (FDA approved for NASAL specimens only), is one component of a comprehensive MRSA colonization surveillance program. It is not intended to diagnose MRSA infection nor to guide or monitor treatment for MRSA infections. RESULT CALLED TO, READ BACK BY AND VERIFIED WITH: RN Suella Broad [id number] AT 1344 BY CM Performed at [facility], [place] [address] [place], [place] [address]     No results for input(s): LIPASE, AMYLASE in the last 168 hours. No results for input(s): AMMONIA in the last 168 hours.  Cardiac Enzymes: No results for input(s): CKTOTAL, CKMB, CKMBINDEX, TROPONINI in the last 168 hours. BNP (last 3 results) Recent Labs    05/06/20 0453  BNP 426.0*    ProBNP (last 3 results) Recent Labs    07/14/19 0000  PROBNP 66.00    Studies:  No results found.     Oswald Hillock   [facility] If  7PM-7AM, please contact night-coverage at [email or web address], Office  [phone]   05/10/2020, 11:18 AM  LOS: 4 days

## 2020-05-11 DIAGNOSIS — Z66 Do not resuscitate: Secondary | ICD-10-CM

## 2020-05-11 DIAGNOSIS — F0150 Vascular dementia without behavioral disturbance: Secondary | ICD-10-CM

## 2020-05-11 DIAGNOSIS — A419 Sepsis, unspecified organism: Secondary | ICD-10-CM | POA: Diagnosis not present

## 2020-05-11 DIAGNOSIS — I10 Essential (primary) hypertension: Secondary | ICD-10-CM | POA: Diagnosis not present

## 2020-05-11 LAB — BASIC METABOLIC PANEL
Anion gap: 13 (ref 5–15)
BUN: 12 mg/dL (ref 8–23)
CO2: 24 mmol/L (ref 22–32)
Calcium: 9.5 mg/dL (ref 8.9–10.3)
Chloride: 102 mmol/L (ref 98–111)
Creatinine, Ser: 0.92 mg/dL (ref 0.61–1.24)
GFR, Estimated: >60 mL/min (ref >60)
Glucose, Bld: 103 mg/dL — ABNORMAL HIGH (ref 70–99)
Potassium: 3.7 mmol/L (ref 3.5–5.1)
Sodium: 139 mmol/L (ref 135–145)

## 2020-05-11 LAB — SARS CORONAVIRUS 2 (TAT 6-24 HRS): SARS Coronavirus 2: NEGATIVE

## 2020-05-11 LAB — CULTURE, BLOOD (ROUTINE X 2)
Culture: NO GROWTH
Culture: NO GROWTH
Special Requests: ADEQUATE

## 2020-05-11 MED ORDER — LORAZEPAM 0.5 MG PO TABS: 0.5000 mg | ORAL_TABLET | Freq: Two times a day (BID) | ORAL | 0 refills | Status: DC

## 2020-05-11 NOTE — TOC Progression Note (Signed)
Transition of Care [facility]) - Progression Note    Patient Details  Name: [name] Duffy MRN: [id number] Date of Birth: 1929/05/31  Transition of Care [facility]) CM/SW Contact  Sharlet Salina Mila Homer, LCSW Phone Number: 05/11/2020, 12:41 PM  Clinical Narrative:  CSW talked with patient's nephew [name] at the bedside regarding patient's discharge. Mr. Fatima Sanger confirmed that patient lives at [facility] skilled nursing facility and his wife lives in [facility] as she is paralyzed. They have been at [facility] for 2 years per Mr. Fatima Sanger and previously lived at [facility].   Call made to [facility], admissions with [facility] and update provided regarding patient's readiness for discharge. Butch Penny informed that patient was COVID tested on the 17th - negative and will be tested again today.   MD informed that patient can discharge back to [facility] SN[facility] today.   Expected Discharge Plan: [place] Barriers to Discharge: Continued Medical Work up  Expected Discharge Plan and Services Expected Discharge Plan: [place] Choice: [place] arrangements for the past 2 months: [place] Expected Discharge Date: 05/11/20                                   Social Determinants of Health (SDOH) Interventions  No SDOH interventions requested or needed at this time.  Readmission Risk Interventions No flowsheet data found.

## 2020-05-11 NOTE — Care Management Important Message (Signed)
Important Message  Patient Details  Name: [name] Duffy MRN: [id number] Date of Birth: 05-10-1929   Medicare Important Message Given:  Yes     [name] 05/11/2020, 2:27 PM

## 2020-05-11 NOTE — TOC Transition Note (Addendum)
Transition of Care [facility]) - CM/SW Discharge Note *Discharged to [facility] *[facility] - Room 123 *Number for Report - [phone]   Patient Details  Name: [name] Duffy MRN: [id number] Date of Birth: May 29, 1929  [facility] of [facility]) CM/SW Contact:  Sable Feil, LCSW Phone Number: 05/11/2020, 1:26 PM   Clinical Narrative: Patient medically stable for discharge and returning to [facility], his location prior to coming to hospital. Talked with nephew at the bedside at Mrs. Kudr[place] by phone regarding patient's discharge.     Contacted [facility] and spoke with Mollie Germany [phone]) regarding patient's discharge and he can return. Ms. Carlota Raspberry doesn't want patient returning late, so indicated that patient could return prior to COVID test resulting and he will be quarantined until the test results.  Mrs. Wadlow contacted [phone]) and informed of her husband's readiness for discharge today.   5:33 pm: Mr. Fatima Sanger, nephew contacted [phone]) and informed that transport had picked up patient to take him to [facility]. Mrs. Nierenberg contacted at 5:37 pm [phone]) and informed that patient had been picked up and was on the way on the way or already at the facility.      Final next level of care: [place] ([facility] skilled nursing facility) Barriers to Discharge: Barriers Resolved   Patient Goals and CMS Choice Patient states their goals for this hospitalization and ongoing recovery are:: Family agreeable to continued rehab CMS Medicare.gov Compare Post Acute Care list provided to:: Other (Comment Required) (not needed as patient returning to [facility] SN[facility]) Choice offered to / list presented to : NA  Discharge Placement   Existing PASRR number confirmed : 05/08/20          Patient chooses bed at: [facility] Patient to be transferred to facility by: Non-emergency ambulance transport Name of family member  notified: Wife - Griffith Santilli by phone and nephew [name] at the bedside Patient and family notified of of transfer: 05/11/20  Discharge Plan and Services     Post Acute Care Choice: [place]                              Social Determinants of Health (SDOH) Interventions  No SDOH interventions requested or needed prior to discharge   Readmission Risk Interventions No flowsheet data found.

## 2020-05-11 NOTE — Progress Notes (Signed)
PT Cancellation Note  Patient Details Name: [name] Duffy MRN: [id number] DOB: 1929/05/31   Cancelled Treatment:    Reason Eval/Treat Not Completed: Other (comment) per chart review, likely returning to SNF this afternoon. From SNF and total assist for transfers in mechanical lift at baseline. Triaging per dept protocol. Thank you for the opportunity to participate in his care!    Windell Norfolk, DPT, PN1   Supplemental Physical Therapist [facility]    Pager [phone] Acute Rehab Office [phone]

## 2020-05-11 NOTE — Progress Notes (Signed)
DISCHARGE NOTE SNF [facility] Sheek to be discharged to [facility] per MD order.   Skin clean, dry and intact without evidence of skin break down, no evidence of skin tears noted. IV catheter discontinued intact. Site without signs and symptoms of complications. Dressing and pressure applied. Pt denies pain at the site currently. No complaints noted.  Patient free of lines, drains, and wounds.   Discharge packet assembled. An After Visit Summary (AVS) was printed and given to the EMS personnel. Patient escorted via stretcher and discharged to [facility] via ambulance. Report called to accepting facility; all questions and concerns addressed.   Mikki Santee, RN

## 2020-05-11 NOTE — Discharge Summary (Signed)
Physician Discharge Summary  [name] Duffy ZDG:[id number] DOB: Oct 06, 1929 DOA: 05/06/2020  PCP: Gayland Curry, DO  Admit date: 05/06/2020 Discharge date: 05/11/2020  Time spent: 50 minutes  Recommendations for Outpatient Follow-up:  1. Patient to be discharged to skilled nursing facility 2. If continues to eat poorly and clinically declines.  Consider hospice referral.  Discharge Diagnoses:  Principal Problem:   Sepsis due to undetermined organism [facility]) Active Problems:   Essential hypertension   Dyslipidemia   Vascular dementia without behavioral disturbance ([place])   Paraesophageal hernia   DNR (do not resuscitate)   Discharge Condition: Stable  Diet recommendation: Heart healthy diet  Filed Weights   05/06/20 1000  Weight: 79.4 kg    History of present illness:  87 year old male with past medical history of prostate cancer, dementia, hypertension, hyperlipidemia presented to ED with hypoxemia.  Patient lives at [facility].  He has vascular dementia and history of CVA.  Patient dementia has progressed over past few months, as patient has not been able to communicate well as per nephew.  In the ED he was found to have new oxygen requirement, chest x-ray/CT abdomen pelvis showed large hiatal hernia. In the ED, patient was placed on BiPAP. Patient has been started on IV vancomycin, cefepime.  Will discontinue Flagyl, as low r probability for abdominal source of infection.  CT abdomen/pelvis is unremarkable.  Lactic acid was elevated 4.8, procalcitonin less than 0.10.   Hospital Course:   1. Sepsis-likely from underlying pneumonia, patient presented with leukocytosis, fever, tachycardia, tachypnea. Lactate more than 2. Blood cultures x2 are negative to date. Sepsis physiology has resolved. Patient started on vancomycin and cefepime. Flagyl was discontinued. Lactate elevated 4.8, procalcitonin less than 0.10. Patient still requiring 2  L/min of oxygen.   Patient completed 5 days of antibiotics in the hospital. 2. Vascular dementia-patient has history of CVA, has intermittent behavioral disturbance. As per nephew patient dementia has worsened over the past few weeks.  Patient mental status seems to have improved, likely at baseline. 3. Hypertension-continue Avapro, Lopressor 4. Hematuria-renal stone-CT scan shows bilateral renal stones largest 11 mm in the right renal pelvis.Hemoglobin is stable.  Patient is asymptomatic.  No pain.  Discussed with patient's nephew.  No further intervention at this time. 5. Hypokalemia-potassium replete 6. Goals of care discussion-discussed with patient's nephew, patient has not eaten since he came to the hospital. Patient's nephew does not want any heroic measures no artificial nutrition with feeding tube. No PEG tube placement.  We will plan to send patient back to the skilled nursing facility.  If patient condition changes and he becomes clinically worse than consider referral for hospice.  Procedures:    Consultations:    Discharge Exam: Vitals:   05/11/20 0550 05/11/20 0933  BP: (!) 143/102 (!) 147/104  Pulse: 89 70  Resp: 18   Temp: 98.8 F (37.1 C) 98.2 F (36.8 C)  SpO2: 95% 93%    General: Appears in no acute distress Cardiovascular: S1-S2, regular Respiratory: Clear to auscultation bilaterally  Discharge Instructions   Discharge Instructions    Diet - low sodium heart healthy   Complete by: As directed    Increase activity slowly   Complete by: As directed      Allergies as of 05/11/2020      Reactions   Rofecoxib Swelling   REACTION: swelling- Vioxx name brand       Medication List    TAKE these medications   acetaminophen 325 MG  tablet Commonly known as: TYLENOL Take 650 mg by mouth daily. And bid prn pain   ALIGN PO Take 1 capsule by mouth daily.   Aspercreme Lidocaine 4 % Ptch Generic drug: Lidocaine Apply 1 patch topically daily as needed  (apply to neck daily as needed for pain). What changed: Another medication with the same name was removed. Continue taking this medication, and follow the directions you see here.   atorvastatin 20 MG tablet Commonly known as: LIPITOR Take 1 tablet (20 mg total) by mouth daily at 6 PM.   BENEFIBER DRINK MIX PO Take 10 mLs by mouth daily. Mix in 6-8 oz beverage of choice daily for loose stools   calcitRIOL 0.25 MCG capsule Commonly known as: ROCALTROL Take 0.25 mcg by mouth daily.   Cholecalciferol 25 MCG (1000 UT) tablet Take 2,000 Units by mouth daily.   cholestyramine 4 g packet Commonly known as: QUESTRAN Take 4 g by mouth 2 (two) times daily.   clopidogrel 75 MG tablet Commonly known as: PLAVIX Take 1 tablet (75 mg total) by mouth daily.   famotidine 20 MG tablet Commonly known as: PEPCID Take 20 mg by mouth at bedtime.   hydrOXYzine 25 MG tablet Commonly known as: ATARAX/VISTARIL Take 25 mg by mouth 4 (four) times daily as needed for anxiety.   ipratropium-albuterol 0.5-2.5 (3) MG/3ML Soln Commonly known as: DUONEB Take 3 mLs by nebulization every 6 (six) hours as needed (shortness of breath, wheezing).   irbesartan 300 MG tablet Commonly known as: AVAPRO Take 300 mg by mouth daily.   latanoprost 0.005 % ophthalmic solution Commonly known as: XALATAN Place 1 drop into the right eye at bedtime.   LORazepam 0.5 MG tablet Commonly known as: ATIVAN Take 1 tablet (0.5 mg total) by mouth 2 (two) times daily.   metoprolol tartrate 25 MG tablet Commonly known as: LOPRESSOR Take 25 mg by mouth 2 (two) times daily.   mirtazapine 30 MG tablet Commonly known as: REMERON Take 30 mg by mouth at bedtime.   ondansetron 4 MG tablet Commonly known as: ZOFRAN Take 4 mg by mouth every 8 (eight) hours as needed for nausea or vomiting.   pantoprazole 40 MG tablet Commonly known as: PROTONIX Take 40 mg by mouth daily.   polyethylene glycol 17 g packet Commonly known  as: MIRALAX / GLYCOLAX Take 17 g by mouth daily as needed for mild constipation.   venlafaxine XR 75 MG 24 hr capsule Commonly known as: EFFEXOR-XR Take 75 mg by mouth daily with breakfast.      Allergies  Allergen Reactions  . Rofecoxib Swelling    REACTION: swelling- Vioxx name brand       The results of significant diagnostics from this hospitalization (including imaging, microbiology, ancillary and laboratory) are listed below for reference.    Significant Diagnostic Studies: CT CHEST ABDOMEN PELVIS W CONTRAST  Result Date: 05/06/2020 CLINICAL DATA:  Chest pain, shortness of breath EXAM: CT CHEST, ABDOMEN, AND PELVIS WITH CONTRAST TECHNIQUE: Multidetector CT imaging of the chest, abdomen and pelvis was performed following the standard protocol during bolus administration of intravenous contrast. CONTRAST:  195mL OMNIPAQUE IOHEXOL 300 MG/ML  SOLN COMPARISON:  None. FINDINGS: CT CHEST FINDINGS Cardiovascular: Heart is upper limits normal in size. Coronary artery and aortic calcifications. Tortuous aorta. No aneurysm. Mediastinum/Nodes: Large hiatal hernia with most of the stomach as well as portions of the transverse colon noted in the hernia. Esophagus is fluid-filled, likely related to reflux. No mediastinal, hilar, or axillary adenopathy.  Thyroid unremarkable. Lungs/Pleura: Bibasilar compressive atelectasis due to the large hernia versus scarring. Trace left effusion. Musculoskeletal: Chest wall soft tissues are unremarkable. No acute bony abnormality. CT ABDOMEN PELVIS FINDINGS Hepatobiliary: No focal hepatic abnormality. Gallbladder unremarkable. Pancreas: No focal abnormality or ductal dilatation. Spleen: No focal abnormality.  Normal size. Adrenals/Urinary Tract: Right renal pelvic stone measures 11 mm. Midpole left renal stone measures 5 mm. Mild fullness of the right renal pelvis. No frank hydronephrosis. Adrenal glands and urinary bladder unremarkable. 5.4 cm cyst off the lower pole  of the left kidney. Stomach/Bowel: Large stool burden in the rectum. Descending colonic and sigmoid diverticulosis. No active diverticulitis. Appendix is normal. No evidence of bowel obstruction. Vascular/Lymphatic: Aortic atherosclerosis. Mild infrarenal abdominal aortic aneurysm, 3.1 cm. No adenopathy. Diffuse aortoiliac atherosclerosis. Reproductive: Postoperative changes in the pelvis likely related to prostatectomy. No focal mass. Other: No free fluid or free air. Musculoskeletal: No acute bony abnormality. IMPRESSION: Very large hiatal hernia containing much of the stomach and portions of the transverse colon. Compressive atelectasis or scarring in the adjacent lower lobes. Coronary artery disease, aortic atherosclerosis. Fluid-filled esophagus, likely related to reflux. Trace left pleural effusion. Bilateral renal stones, the largest in the right renal pelvis measuring 11 mm. Mild fullness of the renal pelvis without frank hydronephrosis. No ureteral stones. Large stool burden in the rectum, question fecal impaction. Sigmoid diverticulosis.  No active diverticulitis. 3.1 cm abdominal aortic aneurysm. Recommend follow-up ultrasound every 3 years. This recommendation follows ACR consensus guidelines: White Paper of the ACR Incidental Findings Committee II on Vascular Findings. J Am Coll Radiol 2013; 10:789-794. Electronically Signed   By: Rolm Baptise M.D.   On: 05/06/2020 08:48   DG Chest Port 1 View  Addendum Date: 05/06/2020   ADDENDUM REPORT: 05/06/2020 05:38 ADDENDUM: These results were called by telephone at the time of interpretation on 05/06/2020 at 5:38 am to provider Ripley Fraise , who verbally acknowledged these results. Electronically Signed   By: Iven Finn M.D.   On: 05/06/2020 05:38   Result Date: 05/06/2020 CLINICAL DATA:  Shortness of breath EXAM: PORTABLE CHEST 1 VIEW COMPARISON:  X-ray esophagus 07/22/2019, chest x-ray 04/06/2018 FINDINGS: Redemonstration of a more prominent  large hiatal hernia with suggestion of gaseous distension of the stomach. No definite pneumatosis. The heart size and mediastinal contours are difficult to evaluate given large hiatal hernia. Cardiomegaly. Aortic arch calcifications. Bilateral passive atelectasis. No focal consolidation. No pulmonary edema. No pleural effusion. No pneumothorax. No acute osseous abnormality. Multilevel degenerative changes of the spine. IMPRESSION: 1. More prominent large hiatal hernia with suggestion of gaseous distension of the stomach. Recommend CT with intravenous contrast for further evaluation. 2. Limited evaluation of the lung parenchyma given atelectasis. Electronically Signed: By: Iven Finn M.D. On: 05/06/2020 05:12    Microbiology: Recent Results (from the past 240 hour(s))  Resp Panel by RT-PCR (Flu A&B, Covid) Nasopharyngeal Swab     Status: None   Collection Time: 05/06/20  5:26 AM   Specimen: Nasopharyngeal Swab; Nasopharyngeal(NP) swabs in vial transport medium  Result Value Ref Range Status   SARS Coronavirus 2 by RT PCR NEGATIVE NEGATIVE Final    Comment: (NOTE) SARS-CoV-2 target nucleic acids are NOT DETECTED.  The SARS-CoV-2 RNA is generally detectable in upper respiratory specimens during the acute phase of infection. The lowest concentration of SARS-CoV-2 viral copies this assay can detect is 138 copies/mL. A negative result does not preclude SARS-Cov-2 infection and should not be used as the sole basis for treatment  or other patient management decisions. A negative result may occur with  improper specimen collection/handling, submission of specimen other than nasopharyngeal swab, presence of viral mutation(s) within the areas targeted by this assay, and inadequate number of viral copies(<138 copies/mL). A negative result must be combined with clinical observations, patient history, and epidemiological information. The expected result is Negative.  Fact Sheet for Patients:   EntrepreneurPulse.com.au  Fact Sheet for Healthcare Providers:  IncredibleEmployment.be  This test is no t yet approved or cleared by the [place] FDA and  has been authorized for detection and/or diagnosis of SARS-CoV-2 by FDA under an Emergency Use Authorization (EUA). This EUA will remain  in effect (meaning this test can be used) for the duration of the COVID-19 declaration under Section 564(b)(1) of the Act, 21 U.S.C.section 360bbb-3(b)(1), unless the authorization is terminated  or revoked sooner.       Influenza A by PCR NEGATIVE NEGATIVE Final   Influenza B by PCR NEGATIVE NEGATIVE Final    Comment: (NOTE) The Xpert Xpress SARS-CoV-2/FLU/RSV plus assay is intended as an aid in the diagnosis of influenza from Nasopharyngeal swab specimens and should not be used as a sole basis for treatment. Nasal washings and aspirates are unacceptable for Xpert Xpress SARS-CoV-2/FLU/RSV testing.  Fact Sheet for Patients: EntrepreneurPulse.com.au  Fact Sheet for Healthcare Providers: IncredibleEmployment.be  This test is not yet approved or cleared by the [place] FDA and has been authorized for detection and/or diagnosis of SARS-CoV-2 by FDA under an Emergency Use Authorization (EUA). This EUA will remain in effect (meaning this test can be used) for the duration of the COVID-19 declaration under Section 564(b)(1) of the Act, 21 U.S.C. section 360bbb-3(b)(1), unless the authorization is terminated or revoked.  Performed at [facility], [place] [address], [place], [place] [address]   Blood Culture (routine x 2)     Status: None   Collection Time: 05/06/20  8:45 AM   Specimen: BLOOD RIGHT HAND  Result Value Ref Range Status   Specimen Description BLOOD RIGHT HAND  Final   Special Requests   Final    BOTTLES DRAWN AEROBIC AND ANAEROBIC Blood Culture results may not be optimal due to an inadequate  volume of blood received in culture bottles   Culture   Final    NO GROWTH 5 DAYS Performed at [facility], [place] [address], [place], [place] [address]    Report Status 05/11/2020 FINAL  Final  Blood Culture (routine x 2)     Status: None   Collection Time: 05/06/20  8:50 AM   Specimen: BLOOD RIGHT ARM  Result Value Ref Range Status   Specimen Description BLOOD RIGHT ARM  Final   Special Requests   Final    BOTTLES DRAWN AEROBIC AND ANAEROBIC Blood Culture adequate volume   Culture   Final    NO GROWTH 5 DAYS Performed at [facility], [place] [address], [place], [place] [address]    Report Status 05/11/2020 FINAL  Final  Urine culture     Status: None   Collection Time: 05/06/20  3:54 PM   Specimen: In/Out Cath Urine  Result Value Ref Range Status   Specimen Description IN/OUT CATH URINE  Final   Special Requests NONE  Final   Culture   Final    NO GROWTH Performed at [facility], [place] [address] [place], [place] [address]    Report Status 05/08/2020 FINAL  Final  MRSA PCR Screening  Status: Abnormal   Collection Time: 05/07/20 11:30 AM   Specimen: Nasopharyngeal  Result Value Ref Range Status   MRSA by PCR POSITIVE (A) NEGATIVE Final    Comment:        The GeneXpert MRSA Assay (FDA approved for NASAL specimens only), is one component of a comprehensive MRSA colonization surveillance program. It is not intended to diagnose MRSA infection nor to guide or monitor treatment for MRSA infections. RESULT CALLED TO, READ BACK BY AND VERIFIED WITH: RN Suella Broad [id number] AT 1344 BY CM Performed at [facility], [place] [address], [place], [place] [address]      Labs: Basic Metabolic Panel: Recent Labs  Lab 05/07/20 0331 05/08/20 0743 05/09/20 0612 05/10/20 0308 05/11/20 0425  NA 140 139 139 139 139  K 4.0 3.6 3.6 3.2* 3.7  CL 106 106 102 104 102  CO2 23 22 24 24 24   GLUCOSE 127* 94 101* 73 103*  BUN 26* 19 18 16 12   CREATININE 1.07 0.91  0.94 0.88 0.92  CALCIUM 9.6 9.7 9.6 9.3 9.5   Liver Function Tests: Recent Labs  Lab 05/06/20 0909  AST 21  ALT 24  ALKPHOS 75  BILITOT 0.9  PROT 6.5  ALBUMIN 3.6   No results for input(s): LIPASE, AMYLASE in the last 168 hours. No results for input(s): AMMONIA in the last 168 hours. CBC: Recent Labs  Lab 05/06/20 0453 05/07/20 0331 05/08/20 0743 05/09/20 0612 05/10/20 0308  WBC 19.8* 20.9* 19.0* 12.7* 12.0*  NEUTROABS 15.2*  --   --   --   --   HGB 15.8 13.4 14.2 13.5 13.6  HCT 49.1 40.3 43.7 40.0 41.8  MCV 88.9 87.0 87.4 87.0 88.4  PLT 287 228 215 200 222    BNP (last 3 results) Recent Labs    05/06/20 0453  BNP 426.0*    ProBNP (last 3 results) Recent Labs    07/14/19 0000  PROBNP 66.00    CBG: Recent Labs  Lab 05/07/20 1152  GLUCAP 99       Signed:  Oswald Hillock MD.  [facility] 05/11/2020, 12:38 PM

## 2020-05-12 ENCOUNTER — Non-Acute Institutional Stay (SKILLED_NURSING_FACILITY): Payer: Medicare Other | Admitting: Internal Medicine

## 2020-05-12 ENCOUNTER — Encounter: Payer: Self-pay | Admitting: Internal Medicine

## 2020-05-12 DIAGNOSIS — K449 Diaphragmatic hernia without obstruction or gangrene: Secondary | ICD-10-CM | POA: Diagnosis not present

## 2020-05-12 DIAGNOSIS — R410 Disorientation, unspecified: Secondary | ICD-10-CM

## 2020-05-12 DIAGNOSIS — Z66 Do not resuscitate: Secondary | ICD-10-CM

## 2020-05-12 DIAGNOSIS — R0600 Dyspnea, unspecified: Secondary | ICD-10-CM | POA: Diagnosis not present

## 2020-05-12 DIAGNOSIS — F0150 Vascular dementia without behavioral disturbance: Secondary | ICD-10-CM

## 2020-05-12 DIAGNOSIS — A419 Sepsis, unspecified organism: Secondary | ICD-10-CM

## 2020-05-12 DIAGNOSIS — R0609 Other forms of dyspnea: Secondary | ICD-10-CM

## 2020-05-12 DIAGNOSIS — F0390 Unspecified dementia without behavioral disturbance: Secondary | ICD-10-CM

## 2020-05-13 ENCOUNTER — Non-Acute Institutional Stay (SKILLED_NURSING_FACILITY): Payer: Medicare Other | Admitting: Adult Health

## 2020-05-13 ENCOUNTER — Encounter: Payer: Self-pay | Admitting: Adult Health

## 2020-05-13 DIAGNOSIS — A419 Sepsis, unspecified organism: Secondary | ICD-10-CM

## 2020-05-13 DIAGNOSIS — F0151 Vascular dementia with behavioral disturbance: Secondary | ICD-10-CM | POA: Diagnosis not present

## 2020-05-13 DIAGNOSIS — K449 Diaphragmatic hernia without obstruction or gangrene: Secondary | ICD-10-CM | POA: Diagnosis not present

## 2020-05-13 DIAGNOSIS — J9601 Acute respiratory failure with hypoxia: Secondary | ICD-10-CM

## 2020-05-13 DIAGNOSIS — F01518 Vascular dementia, unspecified severity, with other behavioral disturbance: Secondary | ICD-10-CM

## 2020-05-13 MED ORDER — MORPHINE SULFATE (CONCENTRATE) 20 MG/ML PO SOLN: 5.0000 mg | ORAL | 0 refills | Status: DC | PRN

## 2020-05-13 MED ORDER — LORAZEPAM 2 MG/ML PO CONC
0.5000 mg | ORAL | 0 refills | Status: DC | PRN
Start: 2020-05-13 — End: 2022-06-30

## 2020-05-18 NOTE — Progress Notes (Signed)
Location:  Occupational psychologist of Service:  SNF (31) Provider:   Cindi Carbon, ANP [place] [phone]   Gayland Curry, DO  Patient Care Team: Gayland Curry, DO as PCP - General (Geriatric Medicine)  Extended Emergency Contact Information Primary Emergency Contact: Guagliardo,[name] Address: [address]          [place] [address] [name] of [place] Phone: [phone] Relation: Spouse Secondary Emergency Contact: [name] Mobile Phone: [phone] Relation: Nephew  Code Status:  DNR Goals of care: Advanced Directive information Advanced Directives 04/21/2020  Does Patient Have a Medical Advance Directive? Yes  Type of Paramedic of [place];Living will;Out of facility DNR (pink MOST or yellow form)  Does patient want to make changes to medical advance directive? -  Copy of [place] in Chart? Yes - validated most recent copy scanned in chart (See row information)  Would patient like information on creating a medical advance directive? -     Chief Complaint  Patient presents with  . Acute Visit    Respiratory distress     HPI:  Pt is a 87 y.o. male seen today for respiratory distress.  PMH significant for vascular dementia, CVA,  Hypertension, hyperlipidemia, prostate cancer, and hypercalcemia.  He was admitted to the hospital February 17-22/2022 for complaints of respiratory distress.  He required BiPAP and had a lactate level of 4.8 and a procalcitonin of less than 0.1.  A CT of the chest showed a very large hiatal hernia with contents of the stomach and portions of the colon above the diaphragm as well as compressive atelectasis and scarring.  He was noted to have a right renal pelvis stone measuring 11 mm without hydronephrosis and a 3.1 cm AAA.  He was negative upon admission and discharge from the hospital for Covid.  Blood cultures were negative x2 and urine showed  no growth. He was treated for possible pneumonia with vancomycin and cefepime for 5 days.  Upon discharge his white blood cell count had trended downward to 12 and he was discharged on 2 L of oxygen.  He was also made a DNR during the hospitalization.  He had not been eating and drinking during the hospitalization it was felt that if he did not begin to do so hospice would be considered.  05/12/2020 I was asked to see him for acute respiratory distress with an respiratory rate of 40, sats are 88% on 2 L and the patient is diaphoretic and appears uncomfortable.  The nurse is requesting morphine.  His family is at the bedside.   Past Medical History:  Diagnosis Date  . Abnormal gait   . Adenocarcinoma of prostate ([place])   . Anxiety   . Arthritis    hands  . Balance problem   . Benzodiazepine dependence ([place])   . Blood transfusion without reported diagnosis    pt thinks had blood trnsfusion at prostate ca surgery   . Blurred vision   . Carotid atherosclerosis   . Cataract   . Depression, major   . DNR (do not resuscitate) 05/06/2020  . Glaucoma   . Hearing loss   . High cholesterol   . Hypertension   . Knee pain, bilateral   . Lacunar infarction ([place])   . Lumbar radiculopathy   . Ophthalmic herpes zoster   . Prostate cancer ([place])   . Sacroiliitis ([place])   . Vertigo    Past Surgical History:  Procedure Laterality Date  . COLONOSCOPY    . HERNIA REPAIR    . PROSTATECTOMY      Allergies  Allergen Reactions  . Rofecoxib Swelling    REACTION: swelling- Vioxx name brand     Outpatient Encounter Medications as of 04/26/2020  Medication Sig  . [DISCONTINUED] LORazepam (ATIVAN) 2 MG/ML concentrated solution Take 0.5 mg by mouth every 2 (two) hours as needed for anxiety.  . [DISCONTINUED] morphine (ROXANOL) 20 MG/ML concentrated solution Take 5 mg by mouth every hour as needed for severe pain.  Marland Kitchen ipratropium-albuterol (DUONEB) 0.5-2.5 (3) MG/3ML SOLN Take 3 mLs by nebulization every 6  (six) hours as needed (shortness of breath, wheezing).  . LORazepam (ATIVAN) 0.5 MG tablet Take 1 tablet (0.5 mg total) by mouth 2 (two) times daily.  Marland Kitchen LORazepam (ATIVAN) 2 MG/ML concentrated solution Take 0.3 mLs (0.6 mg total) by mouth every 2 (two) hours as needed for anxiety. Pt should take 0.5 mg q 2 prn agitation, sob.  Marland Kitchen morphine (ROXANOL) 20 MG/ML concentrated solution Take 0.25 mLs (5 mg total) by mouth every hour as needed for severe pain.  . [DISCONTINUED] acetaminophen (TYLENOL) 325 MG tablet Take 650 mg by mouth daily. And bid prn pain  . [DISCONTINUED] atorvastatin (LIPITOR) 20 MG tablet Take 1 tablet (20 mg total) by mouth daily at 6 PM.  . [DISCONTINUED] calcitRIOL (ROCALTROL) 0.25 MCG capsule Take 0.25 mcg by mouth daily.  . [DISCONTINUED] Cholecalciferol 25 MCG (1000 UT) tablet Take 2,000 Units by mouth daily.  . [DISCONTINUED] cholestyramine (QUESTRAN) 4 g packet Take 4 g by mouth 2 (two) times daily.   . [DISCONTINUED] clopidogrel (PLAVIX) 75 MG tablet Take 1 tablet (75 mg total) by mouth daily.  . [DISCONTINUED] famotidine (PEPCID) 20 MG tablet Take 20 mg by mouth at bedtime.  . [DISCONTINUED] hydrOXYzine (ATARAX/VISTARIL) 25 MG tablet Take 25 mg by mouth 4 (four) times daily as needed for anxiety.  . [DISCONTINUED] irbesartan (AVAPRO) 300 MG tablet Take 300 mg by mouth daily.  . [DISCONTINUED] latanoprost (XALATAN) 0.005 % ophthalmic solution Place 1 drop into the right eye at bedtime.   . [DISCONTINUED] Lidocaine 4 % PTCH Apply 1 patch topically daily as needed (apply to neck daily as needed for pain).  . [DISCONTINUED] metoprolol tartrate (LOPRESSOR) 25 MG tablet Take 25 mg by mouth 2 (two) times daily.   . [DISCONTINUED] mirtazapine (REMERON) 30 MG tablet Take 30 mg by mouth at bedtime.  . [DISCONTINUED] ondansetron (ZOFRAN) 4 MG tablet Take 4 mg by mouth every 8 (eight) hours as needed for nausea or vomiting.  . [DISCONTINUED] pantoprazole (PROTONIX) 40 MG tablet Take 40  mg by mouth daily.  . [DISCONTINUED] polyethylene glycol (MIRALAX / GLYCOLAX) packet Take 17 g by mouth daily as needed for mild constipation.  . [DISCONTINUED] Probiotic Product (ALIGN PO) Take 1 capsule by mouth daily.  . [DISCONTINUED] venlafaxine XR (EFFEXOR-XR) 75 MG 24 hr capsule Take 75 mg by mouth daily with breakfast.  . [DISCONTINUED] Wheat Dextrin (BENEFIBER DRINK MIX PO) Take 10 mLs by mouth daily. Mix in 6-8 oz beverage of choice daily for loose stools   No facility-administered encounter medications on file as of 05/10/2020.    Review of Systems  Unable to perform ROS: Acuity of condition    Immunization History  Administered Date(s) Administered  . Influenza, High Dose Seasonal PF 01/04/2017  . Influenza-Unspecified 12/10/2017, 01/16/2019, 01/09/2020  . Moderna Sars-Covid-2 Vaccination 04/11/2019, 04/29/2019  . Pneumococcal Conjugate-13 01/30/2014  . Pneumococcal  Polysaccharide-23 11/13/2006  . Tdap 11/15/2007, 01/22/2019  . Zoster 11/15/2007   Pertinent  Health Maintenance Due  Topic Date Due  . INFLUENZA VACCINE  Completed  . PNA vac Low Risk Adult  Completed   Fall Risk  05/07/2019 03/18/2019 10/23/2018 05/30/2018  Falls in the past year? 0 1 1 1   Number falls in past yr: 0 0 1 0  Injury with Fall? 0 1 0 -  Risk for fall due to : - Impaired balance/gait;History of fall(s);Impaired vision - -  Follow up - Falls evaluation completed;Falls prevention discussed - -   Functional Status Survey:    Vitals:   05/08/2020 1538  BP: (!) 140/110  Pulse: (!) 112  Resp: (!) 40  SpO2: (!) 88%   There is no height or weight on file to calculate BMI. Physical Exam Vitals and nursing note reviewed.  Constitutional:      General: He is in acute distress.     Appearance: He is ill-appearing, toxic-appearing and diaphoretic.  HENT:     Head: Normocephalic and atraumatic.     Mouth/Throat:     Mouth: Mucous membranes are moist.     Pharynx: Oropharynx is clear.  Eyes:      Conjunctiva/sclera: Conjunctivae normal.     Pupils: Pupils are equal, round, and reactive to light.  Neck:     Thyroid: No thyromegaly.     Vascular: No JVD.     Trachea: No tracheal deviation.  Cardiovascular:     Rate and Rhythm: Normal rate and regular rhythm.     Heart sounds: No murmur heard.   Pulmonary:     Effort: Respiratory distress present.     Breath sounds: No wheezing or rales.     Comments: Decreased breaths sounds throughout the left lung field. Clear lung sounds RUL with decreased right base.  Abdominal:     General: There is no distension.     Palpations: Abdomen is soft.     Tenderness: There is no abdominal tenderness.     Comments: Decreased BS throughout all 4 quads.   Musculoskeletal:     Right lower leg: No edema.     Left lower leg: No edema.  Lymphadenopathy:     Cervical: No cervical adenopathy.  Skin:    General: Skin is warm.     Comments: Mottling to BLE   Neurological:     Mental Status: He is alert.     Cranial Nerves: No cranial nerve deficit.     Comments: Alert and able to intermittently answer questions. Generally weak.   Psychiatric:        Mood and Affect: Mood and affect normal.     Labs reviewed: Recent Labs    05/09/20 0612 05/10/20 0308 05/11/20 0425  NA 139 139 139  K 3.6 3.2* 3.7  CL 102 104 102  CO2 24 24 24   GLUCOSE 101* 73 103*  BUN 18 16 12   CREATININE 0.94 0.88 0.92  CALCIUM 9.6 9.3 9.5   Recent Labs    07/14/19 0000 07/14/19 1200 02/06/20 0000 05/06/20 0909  AST  --  14 18 21   ALT  --  10 15 24   ALKPHOS  --   --   --  75  BILITOT  --   --   --  0.9  PROT  --   --   --  6.5  ALBUMIN 4.2  --  4.6 3.6   Recent Labs    05/06/20 0453 05/07/20  1950 05/08/20 0743 05/09/20 0612 05/10/20 0308  WBC 19.8*   < > 19.0* 12.7* 12.0*  NEUTROABS 15.2*  --   --   --   --   HGB 15.8   < > 14.2 13.5 13.6  HCT 49.1   < > 43.7 40.0 41.8  MCV 88.9   < > 87.4 87.0 88.4  PLT 287   < > 215 200 222   < > = values  in this interval not displayed.   Lab Results  Component Value Date   TSH 3.16 07/14/2019   Lab Results  Component Value Date   HGBA1C 5.5 04/06/2018   Lab Results  Component Value Date   CHOL 160 04/07/2018   HDL 47 04/07/2018   LDLCALC 89 04/07/2018   TRIG 122 04/07/2018   CHOLHDL 3.4 04/07/2018    Significant Diagnostic Results in last 30 days:  CT CHEST ABDOMEN PELVIS W CONTRAST  Result Date: 05/06/2020 CLINICAL DATA:  Chest pain, shortness of breath EXAM: CT CHEST, ABDOMEN, AND PELVIS WITH CONTRAST TECHNIQUE: Multidetector CT imaging of the chest, abdomen and pelvis was performed following the standard protocol during bolus administration of intravenous contrast. CONTRAST:  176mL OMNIPAQUE IOHEXOL 300 MG/ML  SOLN COMPARISON:  None. FINDINGS: CT CHEST FINDINGS Cardiovascular: Heart is upper limits normal in size. Coronary artery and aortic calcifications. Tortuous aorta. No aneurysm. Mediastinum/Nodes: Large hiatal hernia with most of the stomach as well as portions of the transverse colon noted in the hernia. Esophagus is fluid-filled, likely related to reflux. No mediastinal, hilar, or axillary adenopathy. Thyroid unremarkable. Lungs/Pleura: Bibasilar compressive atelectasis due to the large hernia versus scarring. Trace left effusion. Musculoskeletal: Chest wall soft tissues are unremarkable. No acute bony abnormality. CT ABDOMEN PELVIS FINDINGS Hepatobiliary: No focal hepatic abnormality. Gallbladder unremarkable. Pancreas: No focal abnormality or ductal dilatation. Spleen: No focal abnormality.  Normal size. Adrenals/Urinary Tract: Right renal pelvic stone measures 11 mm. Midpole left renal stone measures 5 mm. Mild fullness of the right renal pelvis. No frank hydronephrosis. Adrenal glands and urinary bladder unremarkable. 5.4 cm cyst off the lower pole of the left kidney. Stomach/Bowel: Large stool burden in the rectum. Descending colonic and sigmoid diverticulosis. No active  diverticulitis. Appendix is normal. No evidence of bowel obstruction. Vascular/Lymphatic: Aortic atherosclerosis. Mild infrarenal abdominal aortic aneurysm, 3.1 cm. No adenopathy. Diffuse aortoiliac atherosclerosis. Reproductive: Postoperative changes in the pelvis likely related to prostatectomy. No focal mass. Other: No free fluid or free air. Musculoskeletal: No acute bony abnormality. IMPRESSION: Very large hiatal hernia containing much of the stomach and portions of the transverse colon. Compressive atelectasis or scarring in the adjacent lower lobes. Coronary artery disease, aortic atherosclerosis. Fluid-filled esophagus, likely related to reflux. Trace left pleural effusion. Bilateral renal stones, the largest in the right renal pelvis measuring 11 mm. Mild fullness of the renal pelvis without frank hydronephrosis. No ureteral stones. Large stool burden in the rectum, question fecal impaction. Sigmoid diverticulosis.  No active diverticulitis. 3.1 cm abdominal aortic aneurysm. Recommend follow-up ultrasound every 3 years. This recommendation follows ACR consensus guidelines: White Paper of the ACR Incidental Findings Committee II on Vascular Findings. J Am Coll Radiol 2013; 10:789-794. Electronically Signed   By: Rolm Baptise M.D.   On: 05/06/2020 08:48   DG Chest Port 1 View  Addendum Date: 05/06/2020   ADDENDUM REPORT: 05/06/2020 05:38 ADDENDUM: These results were called by telephone at the time of interpretation on 05/06/2020 at 5:38 am to provider Ripley Fraise , who verbally acknowledged these  results. Electronically Signed   By: Iven Finn M.D.   On: 05/06/2020 05:38   Result Date: 05/06/2020 CLINICAL DATA:  Shortness of breath EXAM: PORTABLE CHEST 1 VIEW COMPARISON:  X-ray esophagus 07/22/2019, chest x-ray 04/06/2018 FINDINGS: Redemonstration of a more prominent large hiatal hernia with suggestion of gaseous distension of the stomach. No definite pneumatosis. The heart size and mediastinal  contours are difficult to evaluate given large hiatal hernia. Cardiomegaly. Aortic arch calcifications. Bilateral passive atelectasis. No focal consolidation. No pulmonary edema. No pleural effusion. No pneumothorax. No acute osseous abnormality. Multilevel degenerative changes of the spine. IMPRESSION: 1. More prominent large hiatal hernia with suggestion of gaseous distension of the stomach. Recommend CT with intravenous contrast for further evaluation. 2. Limited evaluation of the lung parenchyma given atelectasis. Electronically Signed: By: Iven Finn M.D. On: 05/06/2020 05:12    Assessment/Plan 1. Acute respiratory failure with hypoxia (HCC) He has no breath sounds on the left side differential would include pleural effusion vs pneumothorax. He is in acute distress and has mottling to BLE.  Given his progressive dementia and poor intake we have chosen not to explore this further. Will discontinue all oral meds and begin Roxanol 5 mg q 1 hr prn sob or pain, as well as ativan 0.5 mg q 2 prn agitation or sob.   2. Sepsis due to undetermined organism [facility]) ?if he had ischemia due to the hernia which led to his decline No clear data to ascertain this at this time.   3. Paraesophageal hernia Contributes to his respiratory distress with compressive atx and restrictive physiology   4. Vascular dementia with behavior disturbance ([place]) Progressing over the past year.  Goals of care are comfort based.     Family/ staff Communication: discussed with his wife Belenda Cruise at the bedside and family member [name].

## 2020-05-18 DEATH — deceased

## 2020-05-21 ENCOUNTER — Encounter: Payer: Self-pay | Admitting: Internal Medicine

## 2020-05-21 NOTE — Progress Notes (Signed)
Provider:  Rexene Edison. Mariea Clonts, D.O., C.M.D. Location:  [facility] Room Number: [id number] Place of Service:  SNF (31)  PCP: Gayland Curry, DO Patient Care Team: Gayland Curry, DO as PCP - General (Geriatric Medicine)  Extended Emergency Contact Information Primary Emergency Contact: Washinton,[name] Address: [address]          [place] [address] [name] of [place] Phone: [phone] Relation: Spouse Secondary Emergency Contact: [name] Mobile Phone: [phone] Relation: Nephew  Code Status: DNR Goals of Care: Advanced Directive information Advanced Directives 05/08/2020  Does Patient Have a Medical Advance Directive? Yes  Type of Par[facility] of [place];Living will;Out of facility DNR (pink MOST or yellow form)  Does patient want to make changes to medical advance directive? -  Copy of [place] in Chart? Yes - validated most recent copy scanned in chart (See row information)  Would patient like information on creating a medical advance directive? -      Chief Complaint  Patient presents with  . Readmit To SNF    ReAdmit to SNF. Sepsis    HPI: Patient is a 87 y.o. male seen today for readmission to [facility] s/p hospitalization from 2/17-22 for sepsis of unknown source and organism.  Mr. Camargo has a PMH significant for vascular dementia, prior stroke with ataxic gait, IBS, prostate ca, htn, hyperlipidemia.  He was sent out when he had a new oxygen requirement with considerable dyspnea.  He had leukocytosis, fever, tachycardia, tachypnea.  CXR and CT abd/pelvis showed a large hiatal hernia.  Has had paraesophageal hernia, He was placed on bipap at the ED and started on IV vanc, cefepime and flagyl, but flagyl was stopped due to low probability of GI source and otherwise unremarkable CT abd/pelvis.  Lactic acid was elevated at 4.8 and procalcitonin less than 0.10. blood  cultures remained negative.  He continued to require O2 at 2L despite 5 days of abx.  His cognitive status had declined over weeks and had improved some per hospital notes.  He had some hematuria with noted kidney stones but no further intervention was determined due to his overall condition.  His potassium was repleted.  At discharge back here, plans were to monitor his progress but he'd not eaten since he came to the hospital.    When I spoke with nursing prior to seeing him, I was told that the day nurse had spoke with Juanda Crumble, his nephew, and they wanted to see how he did upon his return because he'd been alert and talking with his wife, sitting up in his wheelchair at dinnertime last night.  If he continued to decline, then they would consider hospice.  Unfortunately, this afternoon, he was notably lethargic back in the bed.  I came in to see him and when I said hello, he yelled back "hey" as he often does, but fell right back to sleep.  He was wearing his O2.  He showed on signs of discomfort on exam.     Past Medical History:  Diagnosis Date  . Abnormal gait   . Adenocarcinoma of prostate ([place])   . Anxiety   . Arthritis    hands  . Balance problem   . Benzodiazepine dependence ([place])   . Blood transfusion without reported diagnosis    pt thinks had blood trnsfusion at prostate ca surgery   . Blurred vision   . Carotid atherosclerosis   . Cataract   . Depression,  major   . DNR (do not resuscitate) 05/06/2020  . Glaucoma   . Hearing loss   . High cholesterol   . Hypertension   . Knee pain, bilateral   . Lacunar infarction ([place])   . Lumbar radiculopathy   . Ophthalmic herpes zoster   . Prostate cancer ([place])   . Sacroiliitis ([place])   . Vertigo    Past Surgical History:  Procedure Laterality Date  . COLONOSCOPY    . HERNIA REPAIR    . PROSTATECTOMY      Social History   Socioeconomic History  . Marital status: Married    Spouse name: Not on file  . Number of children: Not  on file  . Years of education: Not on file  . Highest education level: Not on file  Occupational History  . Not on file  Tobacco Use  . Smoking status: Never Smoker  . Smokeless tobacco: Never Used  Vaping Use  . Vaping Use: Never used  Substance and Sexual Activity  . Alcohol use: No  . Drug use: No  . Sexual activity: Not on file  Other Topics Concern  . Not on file  Social History Narrative  . Not on file   Social Determinants of Health   Financial Resource Strain: Not on file  Food Insecurity: Not on file  Transportation Needs: Not on file  Physical Activity: Not on file  Stress: Not on file  Social Connections: Not on file    reports that he has never smoked. He has never used smokeless tobacco. He reports that he does not drink alcohol and does not use drugs.  Functional Status Survey:    Family History  Problem Relation Age of Onset  . Hypertension Other   . Colon cancer Neg Hx   . Colon polyps Neg Hx   . Esophageal cancer Neg Hx   . Rectal cancer Neg Hx   . Stomach cancer Neg Hx     Health Maintenance  Topic Date Due  . COVID-19 Vaccine (3 - Booster) 10/27/2019  . TETANUS/TDAP  01/21/2029  . INFLUENZA VACCINE  Completed  . PNA vac Low Risk Adult  Completed  . HPV VACCINES  Aged Out    Allergies  Allergen Reactions  . Rofecoxib Swelling    REACTION: swelling- Vioxx name brand     Outpatient Encounter Medications as of 05/12/2020  Medication Sig  . ipratropium-albuterol (DUONEB) 0.5-2.5 (3) MG/3ML SOLN Take 3 mLs by nebulization every 6 (six) hours as needed (shortness of breath, wheezing).  . LORazepam (ATIVAN) 0.5 MG tablet Take 1 tablet (0.5 mg total) by mouth 2 (two) times daily.  . [DISCONTINUED] acetaminophen (TYLENOL) 325 MG tablet Take 650 mg by mouth daily. And bid prn pain  . [DISCONTINUED] atorvastatin (LIPITOR) 20 MG tablet Take 1 tablet (20 mg total) by mouth daily at 6 PM.  . [DISCONTINUED] calcitRIOL (ROCALTROL) 0.25 MCG capsule Take  0.25 mcg by mouth daily.  . [DISCONTINUED] Cholecalciferol 25 MCG (1000 UT) tablet Take 2,000 Units by mouth daily.  . [DISCONTINUED] cholestyramine (QUESTRAN) 4 g packet Take 4 g by mouth 2 (two) times daily.   . [DISCONTINUED] clopidogrel (PLAVIX) 75 MG tablet Take 1 tablet (75 mg total) by mouth daily.  . [DISCONTINUED] famotidine (PEPCID) 20 MG tablet Take 20 mg by mouth at bedtime.  . [DISCONTINUED] hydrOXYzine (ATARAX/VISTARIL) 25 MG tablet Take 25 mg by mouth 4 (four) times daily as needed for anxiety.  . [DISCONTINUED] irbesartan (AVAPRO) 300 MG tablet  Take 300 mg by mouth daily.  . [DISCONTINUED] latanoprost (XALATAN) 0.005 % ophthalmic solution Place 1 drop into the right eye at bedtime.   . [DISCONTINUED] Lidocaine 4 % PTCH Apply 1 patch topically daily as needed (apply to neck daily as needed for pain).  . [DISCONTINUED] metoprolol tartrate (LOPRESSOR) 25 MG tablet Take 25 mg by mouth 2 (two) times daily.   . [DISCONTINUED] mirtazapine (REMERON) 30 MG tablet Take 30 mg by mouth at bedtime.  . [DISCONTINUED] ondansetron (ZOFRAN) 4 MG tablet Take 4 mg by mouth every 8 (eight) hours as needed for nausea or vomiting.  . [DISCONTINUED] pantoprazole (PROTONIX) 40 MG tablet Take 40 mg by mouth daily.  . [DISCONTINUED] polyethylene glycol (MIRALAX / GLYCOLAX) packet Take 17 g by mouth daily as needed for mild constipation.  . [DISCONTINUED] Probiotic Product (ALIGN PO) Take 1 capsule by mouth daily.  . [DISCONTINUED] venlafaxine XR (EFFEXOR-XR) 75 MG 24 hr capsule Take 75 mg by mouth daily with breakfast.  . [DISCONTINUED] Wheat Dextrin (BENEFIBER DRINK MIX PO) Take 10 mLs by mouth daily. Mix in 6-8 oz beverage of choice daily for loose stools   No facility-administered encounter medications on file as of 05/12/2020.    Review of Systems  Unable to perform ROS: Dementia (see hpi)    Vitals:   05/12/20 1239  BP: (!) 154/73  Pulse: 75  Resp: 17  Temp: (!) 97.5 F (36.4 C)  TempSrc:  Oral  SpO2: 97%  Weight: 175 lb 6.4 oz (79.6 kg)   Body mass index is 23.14 kg/m. Physical Exam Vitals reviewed.  Constitutional:      General: He is not in acute distress.    Appearance: He is not toxic-appearing.     Comments: Perked up for a second and yelled hey, then drifted back off to sleep  HENT:     Head: Normocephalic and atraumatic.     Right Ear: External ear normal.     Left Ear: External ear normal.     Ears:     Comments: Very HOH    Nose: Nose normal.     Mouth/Throat:     Pharynx: Oropharynx is clear.  Eyes:     Conjunctiva/sclera: Conjunctivae normal.     Pupils: Pupils are equal, round, and reactive to light.  Cardiovascular:     Rate and Rhythm: Normal rate and regular rhythm.     Pulses: Normal pulses.     Heart sounds: Normal heart sounds.  Pulmonary:     Effort: Pulmonary effort is normal.     Breath sounds: No wheezing, rhonchi or rales.     Comments: Poor air movement Abdominal:     General: Bowel sounds are normal.     Palpations: Abdomen is soft.     Tenderness: There is no abdominal tenderness. There is no guarding or rebound.  Musculoskeletal:     Cervical back: Neck supple.     Right lower leg: No edema.     Left lower leg: No edema.  Lymphadenopathy:     Cervical: No cervical adenopathy.  Neurological:     Comments: Resting in bed on left side facing door, too lethargic to follow commands  Psychiatric:     Comments: Too hypoactively delirious to evaluate this     Labs reviewed: Basic Metabolic Panel: Recent Labs    05/09/20 0612 05/10/20 0308 05/11/20 0425  NA 139 139 139  K 3.6 3.2* 3.7  CL 102 104 102  CO2 24 24 24  GLUCOSE 101* 73 103*  BUN 18 16 12   CREATININE 0.94 0.88 0.92  CALCIUM 9.6 9.3 9.5   Liver Function Tests: Recent Labs    07/14/19 0000 07/14/19 1200 02/06/20 0000 05/06/20 0909  AST  --  14 18 21   ALT  --  10 15 24   ALKPHOS  --   --   --  75  BILITOT  --   --   --  0.9  PROT  --   --   --  6.5   ALBUMIN 4.2  --  4.6 3.6   No results for input(s): LIPASE, AMYLASE in the last 8760 hours. No results for input(s): AMMONIA in the last 8760 hours. CBC: Recent Labs    05/06/20 0453 05/07/20 0331 05/08/20 0743 05/09/20 0612 05/10/20 0308  WBC 19.8*   < > 19.0* 12.7* 12.0*  NEUTROABS 15.2*  --   --   --   --   HGB 15.8   < > 14.2 13.5 13.6  HCT 49.1   < > 43.7 40.0 41.8  MCV 88.9   < > 87.4 87.0 88.4  PLT 287   < > 215 200 222   < > = values in this interval not displayed.   Cardiac Enzymes: No results for input(s): CKTOTAL, CKMB, CKMBINDEX, TROPONINI in the last 8760 hours. BNP: Invalid input(s): POCBNP Lab Results  Component Value Date   HGBA1C 5.5 04/06/2018   Lab Results  Component Value Date   TSH 3.16 07/14/2019   No results found for: VITAMINB12 No results found for: FOLATE No results found for: IRON, TIBC, FERRITIN  Imaging and Procedures obtained prior to SNF admission: CT CHEST ABDOMEN PELVIS W CONTRAST  Result Date: 05/06/2020 CLINICAL DATA:  Chest pain, shortness of breath EXAM: CT CHEST, ABDOMEN, AND PELVIS WITH CONTRAST TECHNIQUE: Multidetector CT imaging of the chest, abdomen and pelvis was performed following the standard protocol during bolus administration of intravenous contrast. CONTRAST:  140mL OMNIPAQUE IOHEXOL 300 MG/ML  SOLN COMPARISON:  None. FINDINGS: CT CHEST FINDINGS Cardiovascular: Heart is upper limits normal in size. Coronary artery and aortic calcifications. Tortuous aorta. No aneurysm. Mediastinum/Nodes: Large hiatal hernia with most of the stomach as well as portions of the transverse colon noted in the hernia. Esophagus is fluid-filled, likely related to reflux. No mediastinal, hilar, or axillary adenopathy. Thyroid unremarkable. Lungs/Pleura: Bibasilar compressive atelectasis due to the large hernia versus scarring. Trace left effusion. Musculoskeletal: Chest wall soft tissues are unremarkable. No acute bony abnormality. CT ABDOMEN PELVIS  FINDINGS Hepatobiliary: No focal hepatic abnormality. Gallbladder unremarkable. Pancreas: No focal abnormality or ductal dilatation. Spleen: No focal abnormality.  Normal size. Adrenals/Urinary Tract: Right renal pelvic stone measures 11 mm. Midpole left renal stone measures 5 mm. Mild fullness of the right renal pelvis. No frank hydronephrosis. Adrenal glands and urinary bladder unremarkable. 5.4 cm cyst off the lower pole of the left kidney. Stomach/Bowel: Large stool burden in the rectum. Descending colonic and sigmoid diverticulosis. No active diverticulitis. Appendix is normal. No evidence of bowel obstruction. Vascular/Lymphatic: Aortic atherosclerosis. Mild infrarenal abdominal aortic aneurysm, 3.1 cm. No adenopathy. Diffuse aortoiliac atherosclerosis. Reproductive: Postoperative changes in the pelvis likely related to prostatectomy. No focal mass. Other: No free fluid or free air. Musculoskeletal: No acute bony abnormality. IMPRESSION: Very large hiatal hernia containing much of the stomach and portions of the transverse colon. Compressive atelectasis or scarring in the adjacent lower lobes. Coronary artery disease, aortic atherosclerosis. Fluid-filled esophagus, likely related to reflux. Trace left pleural effusion.  Bilateral renal stones, the largest in the right renal pelvis measuring 11 mm. Mild fullness of the renal pelvis without frank hydronephrosis. No ureteral stones. Large stool burden in the rectum, question fecal impaction. Sigmoid diverticulosis.  No active diverticulitis. 3.1 cm abdominal aortic aneurysm. Recommend follow-up ultrasound every 3 years. This recommendation follows ACR consensus guidelines: White Paper of the ACR Incidental Findings Committee II on Vascular Findings. J Am Coll Radiol 2013; 10:789-794. Electronically Signed   By: Rolm Baptise M.D.   On: 05/06/2020 08:48   DG Chest Port 1 View  Addendum Date: 05/06/2020   ADDENDUM REPORT: 05/06/2020 05:38 ADDENDUM: These results  were called by telephone at the time of interpretation on 05/06/2020 at 5:38 am to provider Ripley Fraise , who verbally acknowledged these results. Electronically Signed   By: Iven Finn M.D.   On: 05/06/2020 05:38   Result Date: 05/06/2020 CLINICAL DATA:  Shortness of breath EXAM: PORTABLE CHEST 1 VIEW COMPARISON:  X-ray esophagus 07/22/2019, chest x-ray 04/06/2018 FINDINGS: Redemonstration of a more prominent large hiatal hernia with suggestion of gaseous distension of the stomach. No definite pneumatosis. The heart size and mediastinal contours are difficult to evaluate given large hiatal hernia. Cardiomegaly. Aortic arch calcifications. Bilateral passive atelectasis. No focal consolidation. No pulmonary edema. No pleural effusion. No pneumothorax. No acute osseous abnormality. Multilevel degenerative changes of the spine. IMPRESSION: 1. More prominent large hiatal hernia with suggestion of gaseous distension of the stomach. Recommend CT with intravenous contrast for further evaluation. 2. Limited evaluation of the lung parenchyma given atelectasis. Electronically Signed: By: Iven Finn M.D. On: 05/06/2020 05:12    Assessment/Plan 1. Sepsis due to undetermined organism [facility]) -sepsis pathophysiology resolved, but it appears delirium remains -family wanting to see if he makes improvements here before considering hospice  2. Paraesophageal hernia -with dysphagia and interfering with respiratory status  3. Dyspnea on exertion -cont oxygen therapy at this time -appears comfortable today in no distress  4. Vascular dementia without behavioral disturbance ([place]) -had been progressing and has been in skilled care, but he's markedly worse cognitively now  5. Delirium with dementia -persists, quite drowsy now and cannot follow commands when prior to admission, he was able to sit up in wheelchair and talke  6. DNR (do not resuscitate) -appears he's near end of life, but family was not yet  ready for hospice, wanting to see if he got any better when back at [facility] and could see his wife  Family/ staff Communication: d/w snf evening nurse and nurse manager who provided info above  Labs/tests ordered:  None--monitor and keep comfortable   Montrel Donahoe L. Jerl Munyan, D.O. [facility] [address], [place] [address] Cell Phone (Mon-Fri 8am-5pm):  [phone] On Call:  [phone] & follow prompts after 5pm & weekends Office Phone:  [phone] Office Fax:  [phone]
